# Patient Record
Sex: Male | Born: 1940
Health system: Southern US, Community
[De-identification: ages and names within clinical notes are randomized; demographics above are authoritative.]

## PROBLEM LIST (undated history)

## (undated) DIAGNOSIS — M1990 Unspecified osteoarthritis, unspecified site: Secondary | ICD-10-CM

## (undated) DIAGNOSIS — F1010 Alcohol abuse, uncomplicated: Secondary | ICD-10-CM

## (undated) DIAGNOSIS — J449 Chronic obstructive pulmonary disease, unspecified: Secondary | ICD-10-CM

## (undated) DIAGNOSIS — I779 Disorder of arteries and arterioles, unspecified: Secondary | ICD-10-CM

## (undated) DIAGNOSIS — I10 Essential (primary) hypertension: Secondary | ICD-10-CM

## (undated) DIAGNOSIS — D649 Anemia, unspecified: Secondary | ICD-10-CM

## (undated) DIAGNOSIS — N189 Chronic kidney disease, unspecified: Secondary | ICD-10-CM

## (undated) DIAGNOSIS — I739 Peripheral vascular disease, unspecified: Secondary | ICD-10-CM

## (undated) DIAGNOSIS — E11319 Type 2 diabetes mellitus with unspecified diabetic retinopathy without macular edema: Secondary | ICD-10-CM

## (undated) DIAGNOSIS — I2510 Atherosclerotic heart disease of native coronary artery without angina pectoris: Secondary | ICD-10-CM

## (undated) DIAGNOSIS — E785 Hyperlipidemia, unspecified: Secondary | ICD-10-CM

## (undated) DIAGNOSIS — I639 Cerebral infarction, unspecified: Secondary | ICD-10-CM

## (undated) DIAGNOSIS — E119 Type 2 diabetes mellitus without complications: Secondary | ICD-10-CM

## (undated) HISTORY — PX: APPENDECTOMY: SHX54

## (undated) HISTORY — DX: Alcohol abuse, uncomplicated: F10.10

## (undated) HISTORY — PX: VASECTOMY: SHX75

## (undated) HISTORY — PX: CORONARY ARTERY BYPASS GRAFT: SHX141

## (undated) HISTORY — DX: Hyperlipidemia, unspecified: E78.5

## (undated) HISTORY — DX: Disorder of arteries and arterioles, unspecified: I77.9

## (undated) HISTORY — DX: Atherosclerotic heart disease of native coronary artery without angina pectoris: I25.10

## (undated) HISTORY — DX: Essential (primary) hypertension: I10

## (undated) HISTORY — DX: Peripheral vascular disease, unspecified: I73.9

## (undated) HISTORY — DX: Chronic obstructive pulmonary disease, unspecified: J44.9

## (undated) HISTORY — DX: Type 2 diabetes mellitus without complications: E11.9

## (undated) HISTORY — DX: Cerebral infarction, unspecified: I63.9

## (undated) HISTORY — PX: BACK SURGERY: SHX140

## (undated) HISTORY — PX: CAROTID ENDARTERECTOMY: SUR193

## (undated) HISTORY — DX: Type 2 diabetes mellitus with unspecified diabetic retinopathy without macular edema: E11.319

---
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
## 2018-02-28 LAB — HM DIABETES EYE EXAM

## 2018-06-01 ENCOUNTER — Ambulatory Visit (INDEPENDENT_AMBULATORY_CARE_PROVIDER_SITE_OTHER): Payer: Medicare HMO

## 2018-06-01 ENCOUNTER — Ambulatory Visit (INDEPENDENT_AMBULATORY_CARE_PROVIDER_SITE_OTHER): Payer: Medicare HMO | Admitting: Physician Assistant

## 2018-06-01 ENCOUNTER — Encounter: Payer: Self-pay | Admitting: Physician Assistant

## 2018-06-01 VITALS — BP 129/81 | HR 101 | Resp 14 | Ht 65.0 in | Wt 133.0 lb

## 2018-06-01 DIAGNOSIS — Z981 Arthrodesis status: Secondary | ICD-10-CM

## 2018-06-01 DIAGNOSIS — I6521 Occlusion and stenosis of right carotid artery: Secondary | ICD-10-CM

## 2018-06-01 DIAGNOSIS — Z23 Encounter for immunization: Secondary | ICD-10-CM | POA: Diagnosis not present

## 2018-06-01 DIAGNOSIS — I779 Disorder of arteries and arterioles, unspecified: Secondary | ICD-10-CM | POA: Insufficient documentation

## 2018-06-01 DIAGNOSIS — R000 Tachycardia, unspecified: Secondary | ICD-10-CM

## 2018-06-01 DIAGNOSIS — R05 Cough: Secondary | ICD-10-CM | POA: Diagnosis not present

## 2018-06-01 DIAGNOSIS — Z130 Encounter for screening for diseases of the blood and blood-forming organs and certain disorders involving the immune mechanism: Secondary | ICD-10-CM

## 2018-06-01 DIAGNOSIS — J449 Chronic obstructive pulmonary disease, unspecified: Secondary | ICD-10-CM

## 2018-06-01 DIAGNOSIS — I739 Peripheral vascular disease, unspecified: Secondary | ICD-10-CM

## 2018-06-01 DIAGNOSIS — Z7689 Persons encountering health services in other specified circumstances: Secondary | ICD-10-CM

## 2018-06-01 DIAGNOSIS — E1143 Type 2 diabetes mellitus with diabetic autonomic (poly)neuropathy: Secondary | ICD-10-CM

## 2018-06-01 DIAGNOSIS — Z91199 Patient's noncompliance with other medical treatment and regimen due to unspecified reason: Secondary | ICD-10-CM

## 2018-06-01 DIAGNOSIS — R0602 Shortness of breath: Secondary | ICD-10-CM

## 2018-06-01 DIAGNOSIS — I2510 Atherosclerotic heart disease of native coronary artery without angina pectoris: Secondary | ICD-10-CM

## 2018-06-01 DIAGNOSIS — R413 Other amnesia: Secondary | ICD-10-CM

## 2018-06-01 DIAGNOSIS — I700 Atherosclerosis of aorta: Secondary | ICD-10-CM

## 2018-06-01 DIAGNOSIS — IMO0002 Reserved for concepts with insufficient information to code with codable children: Secondary | ICD-10-CM

## 2018-06-01 DIAGNOSIS — E1121 Type 2 diabetes mellitus with diabetic nephropathy: Secondary | ICD-10-CM

## 2018-06-01 DIAGNOSIS — E11319 Type 2 diabetes mellitus with unspecified diabetic retinopathy without macular edema: Secondary | ICD-10-CM | POA: Insufficient documentation

## 2018-06-01 DIAGNOSIS — Z951 Presence of aortocoronary bypass graft: Secondary | ICD-10-CM

## 2018-06-01 DIAGNOSIS — E1165 Type 2 diabetes mellitus with hyperglycemia: Secondary | ICD-10-CM

## 2018-06-01 DIAGNOSIS — Z9111 Patient's noncompliance with dietary regimen: Secondary | ICD-10-CM

## 2018-06-01 DIAGNOSIS — Z8639 Personal history of other endocrine, nutritional and metabolic disease: Secondary | ICD-10-CM

## 2018-06-01 MED ORDER — TIOTROPIUM BROMIDE-OLODATEROL 2.5-2.5 MCG/ACT IN AERS: 2.0000 | INHALATION_SPRAY | Freq: Every day | RESPIRATORY_TRACT | 11 refills | Status: DC

## 2018-06-01 MED ORDER — ASPIRIN 325 MG PO TABS: 325.0000 mg | ORAL_TABLET | Freq: Every day | ORAL | Status: DC

## 2018-06-01 MED ORDER — ASPIRIN EC 81 MG PO TBEC: 81.0000 mg | DELAYED_RELEASE_TABLET | Freq: Every day | ORAL | 3 refills | Status: DC

## 2018-06-01 MED FILL — STIOLTO RESPIMAT INHAL SPRY: 2.5-2.5 | 30 days supply | Qty: 4 | Fill #0
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
## 2018-06-01 NOTE — Patient Instructions (Addendum)
Diabetes Preventive Care: - annual foot exam  - annual dilated eye exam with an eye doctor - self foot exams at least weekly - pneumonia vaccine once (booster in 5 years and at age 78) - annual influenza vaccine - twice yearly dental cleanings and yearly exam - goal blood pressure <140/90, ideally <130/80 - LDL cholesterol <70 - A1C <7.0 - body mass index (BMI) <25.0 - follow-up every 3 months if your A1C is not at goal - follow-up every 6 months if diabetes is well controlled   Diabetic Neuropathy Diabetic neuropathy refers to nerve damage that is caused by diabetes (diabetes mellitus). Over time, people with diabetes can develop nerve damage throughout the body. There are several types of diabetic neuropathy:  Peripheral neuropathy. This is the most common type of diabetic neuropathy. It causes damage to nerves that carry signals between the spinal cord and other parts of the body (peripheral nerves). This usually affects nerves in the feet and legs first, and may eventually affect the hands and arms. The damage affects the ability to sense touch or temperature.  Autonomic neuropathy. This type causes damage to nerves that control involuntary functions (autonomic nerves). These nerves carry signals that control: ? Heartbeat. ? Body temperature. ? Blood pressure. ? Urination. ? Digestion. ? Sweating. ? Sexual function. ? Response to changing blood sugar (glucose) levels.  Focal neuropathy. This type of nerve damage affects one area of the body, such as an arm, a leg, or the face. The injury may involve one nerve or a small group of nerves. Focal neuropathy can be painful and unpredictable, and occurs most often in older adults with diabetes. This often develops suddenly, but usually improves over time and does not cause long-term problems.  Proximal neuropathy. This type of nerve damage affects the nerves of the thighs, hips, buttocks, or legs. It causes severe pain, weakness, and  muscle death (atrophy), usually in the thigh muscles. It is more common among older men and people who have type 2 diabetes. The length of recovery time may vary. What are the causes? Peripheral, autonomic, and focal neuropathies are caused by diabetes that is not well controlled with treatment. The cause of proximal neuropathy is not known, but it may be caused by inflammation related to uncontrolled blood glucose levels. What are the signs or symptoms? Peripheral neuropathy Peripheral neuropathy develops slowly over time. When the nerves of the feet and legs no longer work, you may experience:  Burning, stabbing, or aching pain in the legs or feet.  Pain or cramping in the legs or feet.  Loss of feeling (numbness) and inability to feel pressure or pain in the feet. This can lead to: ? Thick calluses or sores on areas of constant pressure. ? Ulcers. ? Reduced ability to feel temperature changes.  Foot deformities.  Muscle weakness.  Loss of balance or coordination. Autonomic neuropathy The symptoms of autonomic neuropathy vary depending on which nerves are affected. Symptoms may include:  Problems with digestion, such as: ? Nausea or vomiting. ? Poor appetite. ? Bloating. ? Diarrhea or constipation. ? Trouble swallowing. ? Losing weight without trying to.  Problems with the heart, blood and lungs, such as: ? Dizziness, especially when standing up. ? Fainting. ? Shortness of breath. ? Irregular heartbeat.  Bladder problems, such as: ? Trouble starting or stopping urination. ? Leaking urine. ? Trouble emptying the bladder. ? Urinary tract infections (UTIs).  Problems with other body functions, such as: ? Sweat. You may sweat too much  or too little. ? Temperature. You might get hot easily. Or, you might feel cold more than usual. ? Sexual function. Men may not be able to get or maintain an erection. Women may have vaginal dryness and difficulty with arousal. Focal  neuropathy Symptoms affect only one area of the body. Common symptoms include:  Numbness.  Tingling.  Burning pain.  Prickling feeling.  Very sensitive skin.  Weakness.  Inability to move (paralysis).  Muscle twitching.  Muscles getting smaller (wasting).  Poor coordination.  Double or blurred vision. Proximal neuropathy  Sudden, severe pain in the hip, thigh, or buttocks. Pain may spread from the back into the legs (sciatica).  Pain and numbness in the arms and legs.  Tingling.  Loss of bladder control or bowel control.  Weakness and wasting of thigh muscles.  Difficulty getting up from a seated position.  Abdominal swelling.  Unexplained weight loss. How is this diagnosed? Diagnosis usually involves reviewing your medical history and any symptoms you have. Diagnosis varies depending on the type of neuropathy your health care provider suspects. Peripheral neuropathy Your health care provider will check areas that are affected by your nervous system (neurologic exam), such as your reflexes, how you move, and what you can feel. You may have other tests, such as:  Blood tests.  Removal and examination of fluid that surrounds the spinal cord (lumbar puncture).  CT scan.  MRI.  A test to check the nerves that control muscles (electromyogram, EMG).  Tests of how quickly messages pass through your nerves (nerve conduction velocity tests).  Removal of a small piece of nerve to be examined under a microscope (biopsy). Autonomic neuropathy You may have tests, such as:  Tests to measure your blood pressure and heart rate. This may include monitoring you while you are safely secured to an exam table that moves you from a lying position to an upright position (table tilt test).  Breathing tests to check your lungs.  Tests to check how food moves through the digestive system (gastric emptying tests).  Blood, sweat, or urine tests.  Ultrasound of your  bladder.  Spinal fluid tests. Focal neuropathy This condition may be diagnosed with:  A neurologic exam.  CT scan.  MRI.  EMG.  Nerve conduction velocity tests. Proximal neuropathy There is no test to diagnose this type of neuropathy. You may have tests to rule out other possible causes of this type of neuropathy. Tests may include:  X-rays of your spine and lumbar region.  Lumbar puncture.  MRI. How is this treated? The goal of treatment is to keep nerve damage from getting worse. The most important part of treatment is keeping your blood glucose level and your A1C level within your target range by following your diabetes management plan. Over time, maintaining lower blood glucose levels helps lessen symptoms. In some cases, you may need prescription pain medicine. Follow these instructions at home:  Lifestyle   Do not use any products that contain nicotine or tobacco, such as cigarettes and e-cigarettes. If you need help quitting, ask your health care provider.  Be physically active every day. Include strength training and balance exercises.  Follow a healthy meal plan.  Work with your health care provider to manage your blood pressure. General instructions  Follow your diabetes management plan as directed. ? Check your blood glucose levels as directed by your health care provider. ? Keep your blood glucose in your target range as directed by your health care provider. ? Have your  A1C level checked at least two times a year, or as often as told by your health care provider.  Take over the counter and prescription medicines only as told by your health care provider. This includes insulin and diabetes medicine.  Do not drive or use heavy machinery while taking prescription pain medicines.  Check your skin and feet every day for cuts, bruises, redness, blisters, or sores.  Keep all follow up visits as told by your health care provider. This is important. Contact a  health care provider if:  You have burning, stabbing, or aching pain in your legs or feet.  You are unable to feel pressure or pain in your feet.  You develop problems with digestion, such as: ? Nausea. ? Vomiting. ? Bloating. ? Constipation. ? Diarrhea. ? Abdominal pain.  You have difficulty with urination, such as inability: ? To control when you urinate (incontinence). ? To completely empty the bladder (retention).  You have palpitations.  You feel dizzy, weak, or faint when you stand up. Get help right away if:  You cannot urinate.  You have sudden weakness or loss of coordination.  You have trouble speaking.  You have pain or pressure in your chest.  You have an irregular heart beat.  You have sudden inability to move a part of your body. Summary  Diabetic neuropathy refers to nerve damage that is caused by diabetes. It can affect nerves throughout the entire body, causing numbness and pain in the arms, legs, digestive tract, heart, and other body systems.  Keep your blood glucose level and your blood pressure in your target range, as directed by your health care provider. This can help prevent neuropathy from getting worse.  Check your skin and feet every day for cuts, bruises, redness, blisters, or sores.  Do not use any products that contain nicotine or tobacco, such as cigarettes and e-cigarettes. If you need help quitting, ask your health care provider. This information is not intended to replace advice given to you by your health care provider. Make sure you discuss any questions you have with your health care provider. Document Released: 06/28/2001 Document Revised: 06/01/2017 Document Reviewed: 05/24/2016 Elsevier Interactive Patient Education  2019 [employer]   Chronic Obstructive Pulmonary Disease  Chronic obstructive pulmonary disease (COPD) is a long-term (chronic) condition that affects the lungs. COPD is a general term that can be used to  describe many different lung problems that cause lung swelling (inflammation) and limit airflow, including chronic bronchitis and emphysema. If you have COPD, your lung function will probably never return to normal. In most cases, it gets worse over time. However, there are steps you can take to slow the progression of the disease and improve your quality of life. What are the causes? This condition may be caused by:  Smoking. This is the most common cause.  Certain genes passed down through families. What increases the risk? The following factors may make you more likely to develop this condition:  Secondhand smoke from cigarettes, pipes, or cigars.  Exposure to chemicals and other irritants such as fumes and dust in the work environment.  Chronic lung conditions or infections. What are the signs or symptoms? Symptoms of this condition include:  Shortness of breath, especially during physical activity.  Chronic cough with a large amount of thick mucus. Sometimes the cough may not have any mucus (dry cough).  Wheezing.  Rapid breaths.  Pearline Cables or bluish discoloration (cyanosis) of the skin, especially in your fingers, toes, or  lips.  Feeling tired (fatigue).  Weight loss.  Chest tightness.  Frequent infections.  Episodes when breathing symptoms become much worse (exacerbations).  Swelling in the ankles, feet, or legs. This may occur in later stages of the disease. How is this diagnosed? This condition is diagnosed based on:  Your medical history.  A physical exam. You may also have tests, including:  Lung (pulmonary) function tests. This may include a spirometry test, which measures your ability to exhale properly.  Chest X-ray.  CT scan.  Blood tests. How is this treated? This condition may be treated with:  Medicines. These may include inhaled rescue medicines to treat acute exacerbations as well as long-term, or maintenance, medicines to prevent flare-ups of  COPD. ? Bronchodilators help treat COPD by dilating the airways to allow increased airflow and make your breathing more comfortable. ? Steroids can reduce airway inflammation and help prevent exacerbations.  Smoking cessation. If you smoke, your health care provider may ask you to quit, and may also recommend therapy or replacement products to help you quit.  Pulmonary rehabilitation. This may involve working with a team of health care providers and specialists, such as respiratory, occupational, and physical therapists.  Exercise and physical activity. These are beneficial for nearly all people with COPD.  Nutrition therapy to gain weight, if you are underweight.  Oxygen. Supplemental oxygen therapy is only helpful if you have a low oxygen level in your blood (hypoxemia).  Lung surgery or transplant.  Palliative care. This is to help people with COPD feel comfortable when treatment is no longer working. Follow these instructions at home: Medicines  Take over-the-counter and prescription medicines (inhaled or pills) only as told by your health care provider.  Talk to your health care provider before taking any cough or allergy medicines. You may need to avoid certain medicines that dry out your airways. Lifestyle  If you are a smoker, the most important thing that you can do is to stop smoking. Do not use any products that contain nicotine or tobacco, such as cigarettes and e-cigarettes. If you need help quitting, ask your health care provider. Continuing to smoke will cause the disease to progress faster.  Avoid exposure to things that irritate your lungs, such as smoke, chemicals, and fumes.  Stay active, but balance activity with periods of rest. Exercise and physical activity will help you maintain your ability to do things you want to do.  Learn and use relaxation techniques to manage stress and to control your breathing.  Get the right amount of sleep and get quality sleep.  Most adults need 7 or more hours per night.  Eat healthy foods. Eating smaller, more frequent meals and resting before meals may help you maintain your strength. Controlled breathing Learn and use controlled breathing techniques as directed by your health care provider. Controlled breathing techniques include:  Pursed lip breathing. Start by breathing in (inhaling) through your nose for 1 second. Then, purse your lips as if you were going to whistle and breathe out (exhale) through the pursed lips for 2 seconds.  Diaphragmatic breathing. Start by putting one hand on your abdomen just above your waist. Inhale slowly through your nose. The hand on your abdomen should move out. Then purse your lips and exhale slowly. You should be able to feel the hand on your abdomen moving in as you exhale. Controlled coughing Learn and use controlled coughing to clear mucus from your lungs. Controlled coughing is a series of short, progressive coughs. The  steps of controlled coughing are: 1. Lean your head slightly forward. 2. Breathe in deeply using diaphragmatic breathing. 3. Try to hold your breath for 3 seconds. 4. Keep your mouth slightly open while coughing twice. 5. Spit any mucus out into a tissue. 6. Rest and repeat the steps once or twice as needed. General instructions  Make sure you receive all the vaccines that your health care provider recommends, especially the pneumococcal and influenza vaccines. Preventing infection and hospitalization is very important when you have COPD.  Use oxygen therapy and pulmonary rehabilitation if directed to by your health care provider. If you require home oxygen therapy, ask your health care provider whether you should purchase a pulse oximeter to measure your oxygen level at home.  Work with your health care provider to develop a COPD action plan. This will help you know what steps to take if your condition gets worse.  Keep other chronic health conditions  under control as told by your health care provider.  Avoid extreme temperature and humidity changes.  Avoid contact with people who have an illness that spreads from person to person (is contagious), such as viral infections or pneumonia.  Keep all follow-up visits as told by your health care provider. This is important. Contact a health care provider if:  You are coughing up more mucus than usual.  There is a change in the color or thickness of your mucus.  Your breathing is more labored than usual.  Your breathing is faster than usual.  You have difficulty sleeping.  You need to use your rescue medicines or inhalers more often than expected.  You have trouble doing routine activities such as getting dressed or walking around the house. Get help right away if:  You have shortness of breath while you are resting.  You have shortness of breath that prevents you from: ? Being able to talk. ? Performing your usual physical activities.  You have chest pain lasting longer than 5 minutes.  Your skin color is more blue (cyanotic) than usual.  You measure low oxygen saturations for longer than 5 minutes with a pulse oximeter.  You have a fever.  You feel too tired to breathe normally. Summary  Chronic obstructive pulmonary disease (COPD) is a long-term (chronic) condition that affects the lungs.  Your lung function will probably never return to normal. In most cases, it gets worse over time. However, there are steps you can take to slow the progression of the disease and improve your quality of life.  Treatment for COPD may include taking medicines, quitting smoking, pulmonary rehabilitation, and changes to diet and exercise. As the disease progresses, you may need oxygen therapy, a lung transplant, or palliative care.  To help manage your condition, do not smoke, avoid exposure to things that irritate your lungs, stay up to date on all vaccines, and follow your health care  provider's instructions for taking medicines. This information is not intended to replace advice given to you by your health care provider. Make sure you discuss any questions you have with your health care provider. Document Released: 01/27/2005 Document Revised: 10/13/2016 Document Reviewed: 05/24/2016 Elsevier Interactive Patient Education  2019 [employer].

## 2018-06-02 ENCOUNTER — Encounter: Payer: Self-pay | Admitting: Physician Assistant

## 2018-06-02 ENCOUNTER — Other Ambulatory Visit: Payer: Self-pay

## 2018-06-02 DIAGNOSIS — R0602 Shortness of breath: Secondary | ICD-10-CM | POA: Insufficient documentation

## 2018-06-02 DIAGNOSIS — R413 Other amnesia: Secondary | ICD-10-CM | POA: Insufficient documentation

## 2018-06-02 DIAGNOSIS — E1121 Type 2 diabetes mellitus with diabetic nephropathy: Secondary | ICD-10-CM | POA: Insufficient documentation

## 2018-06-02 DIAGNOSIS — J449 Chronic obstructive pulmonary disease, unspecified: Secondary | ICD-10-CM | POA: Insufficient documentation

## 2018-06-02 DIAGNOSIS — Z951 Presence of aortocoronary bypass graft: Secondary | ICD-10-CM | POA: Insufficient documentation

## 2018-06-02 DIAGNOSIS — E1143 Type 2 diabetes mellitus with diabetic autonomic (poly)neuropathy: Secondary | ICD-10-CM | POA: Insufficient documentation

## 2018-06-02 DIAGNOSIS — Z981 Arthrodesis status: Secondary | ICD-10-CM | POA: Insufficient documentation

## 2018-06-02 DIAGNOSIS — R000 Tachycardia, unspecified: Secondary | ICD-10-CM | POA: Insufficient documentation

## 2018-06-02 DIAGNOSIS — E1165 Type 2 diabetes mellitus with hyperglycemia: Secondary | ICD-10-CM

## 2018-06-02 DIAGNOSIS — IMO0002 Reserved for concepts with insufficient information to code with codable children: Secondary | ICD-10-CM

## 2018-06-02 DIAGNOSIS — E1140 Type 2 diabetes mellitus with diabetic neuropathy, unspecified: Secondary | ICD-10-CM | POA: Insufficient documentation

## 2018-06-02 DIAGNOSIS — M19019 Primary osteoarthritis, unspecified shoulder: Secondary | ICD-10-CM | POA: Insufficient documentation

## 2018-06-02 LAB — LIPID PANEL W/REFLEX DIRECT LDL
CHOLESTEROL: 206 mg/dL — AB (ref <200)
HDL: 40 mg/dL — ABNORMAL LOW (ref >40)
LDL Cholesterol (Calc): 144 mg/dL (calc) — ABNORMAL HIGH
Non-HDL Cholesterol (Calc): 166 mg/dL (calc) — ABNORMAL HIGH (ref <130)
Total CHOL/HDL Ratio: 5.2 (calc) — ABNORMAL HIGH (ref <5.0)
Triglycerides: 102 mg/dL (ref <150)

## 2018-06-02 LAB — CBC
HCT: 42.2 % (ref 38.5–50.0)
Hemoglobin: 14.3 g/dL (ref 13.2–17.1)
MCH: 30.6 pg (ref 27.0–33.0)
MCHC: 33.9 g/dL (ref 32.0–36.0)
MCV: 90.2 fL (ref 80.0–100.0)
MPV: 9.8 fL (ref 7.5–12.5)
Platelets: 423 10*3/uL — ABNORMAL HIGH (ref 140–400)
RBC: 4.68 10*6/uL (ref 4.20–5.80)
RDW: 12 % (ref 11.0–15.0)
WBC: 10.5 10*3/uL (ref 3.8–10.8)

## 2018-06-02 LAB — COMPLETE METABOLIC PANEL WITH GFR
AG Ratio: 1.1 (calc) (ref 1.0–2.5)
ALT: 10 U/L (ref 9–46)
AST: 13 U/L (ref 10–35)
Albumin: 3.8 g/dL (ref 3.6–5.1)
Alkaline phosphatase (APISO): 113 U/L (ref 40–115)
BUN/Creatinine Ratio: 14 (calc) (ref 6–22)
BUN: 17 mg/dL (ref 7–25)
CO2: 27 mmol/L (ref 20–32)
Calcium: 9.3 mg/dL (ref 8.6–10.3)
Chloride: 101 mmol/L (ref 98–110)
Creat: 1.25 mg/dL — ABNORMAL HIGH (ref 0.70–1.18)
GFR, EST AFRICAN AMERICAN: 64 mL/min/{1.73_m2} (ref >=60)
GFR, Est Non African American: 55 mL/min/{1.73_m2} — ABNORMAL LOW (ref >=60)
Globulin: 3.6 g/dL (calc) (ref 1.9–3.7)
Glucose, Bld: 196 mg/dL — ABNORMAL HIGH (ref 65–99)
Potassium: 4.4 mmol/L (ref 3.5–5.3)
Sodium: 137 mmol/L (ref 135–146)
TOTAL PROTEIN: 7.4 g/dL (ref 6.1–8.1)
Total Bilirubin: 0.6 mg/dL (ref 0.2–1.2)

## 2018-06-02 LAB — MICROALBUMIN / CREATININE URINE RATIO
Creatinine, Urine: 126 mg/dL (ref 20–320)
MICROALB UR: 185.8 mg/dL
Microalb Creat Ratio: 1475 mcg/mg creat — ABNORMAL HIGH (ref <30)

## 2018-06-02 LAB — RPR: RPR Ser Ql: NONREACTIVE

## 2018-06-02 LAB — C-REACTIVE PROTEIN: CRP: 14.5 mg/L — ABNORMAL HIGH (ref <8.0)

## 2018-06-02 LAB — SEDIMENTATION RATE: Sed Rate: 43 mm/h — ABNORMAL HIGH (ref 0–20)

## 2018-06-02 LAB — HEMOGLOBIN A1C
Hgb A1c MFr Bld: 10.2 % of total Hgb — ABNORMAL HIGH (ref <5.7)
Mean Plasma Glucose: 246 (calc)
eAG (mmol/L): 13.6 (calc)

## 2018-06-02 LAB — B12 AND FOLATE PANEL
Folate: 15.3 ng/mL
Vitamin B-12: 562 pg/mL (ref 200–1100)

## 2018-06-02 LAB — TSH+FREE T4: TSH W/REFLEX TO FT4: 2.07 mIU/L (ref 0.40–4.50)

## 2018-06-02 MED ORDER — EMPAGLIFLOZIN 10 MG PO TABS: 10.0000 mg | ORAL_TABLET | ORAL | 1 refills | Status: DC

## 2018-06-02 MED ORDER — DULAGLUTIDE 0.75 MG/0.5ML ~~LOC~~ SOAJ: 0.5000 mL | SUBCUTANEOUS | 1 refills | Status: DC

## 2018-06-02 MED ORDER — LOSARTAN POTASSIUM 25 MG PO TABS: 25.0000 mg | ORAL_TABLET | ORAL | 1 refills | Status: DC

## 2018-06-02 MED FILL — LOSARTAN POTASSIUM 25 MG TA: 25 | 30 days supply | Qty: 30 | Fill #0

## 2018-06-02 MED FILL — TRULICITY 0.75 MG/0.5 ML PE: 0.75 | 28 days supply | Qty: 2 | Fill #0

## 2018-06-02 MED FILL — JARDIANCE 10 MG TABLET: 10 | 30 days supply | Qty: 30 | Fill #0

## 2018-06-02 NOTE — Progress Notes (Signed)
Labs show uncontrolled diabetes, A1c over 10 Definitely need to restart medication Rather than putting him on Lantus which increases risks of hypoglycemia, I would like to start him on a once weekly injectable medicine called Trulicity.  Also going to start him on an oral medicine called Jardiance.  Kelly Williams has been shown to decrease the risk of heart attack and death from heart attack in patients with known heart disease. Also he really should be on a statin medication for his coronary artery disease.  His LDL is 144 and his goal is less than 70. Lastly labs are showing kidney injury from the diabetes, known as diabetic nephropathy.  Treatment for this is blood pressure medication known as an ACE inhibitor.  Patient mentioned at last office visit that he did not like how his heart medications made him feel.  We could start a very low dose of losartan and see how he does with this.  We will need to recheck kidney function in 2 weeks Follow-up in the office for diabetes in 1 month

## 2018-06-20 ENCOUNTER — Telehealth: Payer: Self-pay | Admitting: Physician Assistant

## 2018-06-20 DIAGNOSIS — J449 Chronic obstructive pulmonary disease, unspecified: Secondary | ICD-10-CM

## 2018-06-20 MED ORDER — NEBULIZER/TUBING/MOUTHPIECE KIT: PACK | 5 refills | Status: DC

## 2018-06-20 MED ORDER — ALBUTEROL SULFATE HFA 108 (90 BASE) MCG/ACT IN AERS: 1.0000 | INHALATION_SPRAY | RESPIRATORY_TRACT | 5 refills | Status: AC | PRN

## 2018-06-20 MED ORDER — NEBULIZER/TUBING/MOUTHPIECE KIT: PACK | 5 refills | Status: AC

## 2018-06-20 MED FILL — ALBUTEROL SULFATE HFA 108 (: 108 (90 BAS | 12 days supply | Qty: 9 | Fill #0

## 2018-06-20 NOTE — Addendum Note (Signed)
Addended by: Lavon Paganini on: 06/20/2018 04:33 PM   Modules accepted: Orders

## 2018-06-20 NOTE — Telephone Encounter (Signed)
Daughter advised.

## 2018-06-20 NOTE — Telephone Encounter (Signed)
Sent 1. Rx for neb tubing and 2. Rx for Albuterol MDI inhaler to pharmacy on file

## 2018-06-20 NOTE — Telephone Encounter (Signed)
Per daughter request, Rx for neb tubing printed to be sent to [facility] fax # [phone] and phone number is [phone]

## 2018-06-20 NOTE — Telephone Encounter (Signed)
Pt's daughter called to request Rx for nebulizer tubing (uses duoneb every 4 hours PRN SOB) and an albuterol rescue inhaler. Routing.

## 2018-06-29 ENCOUNTER — Ambulatory Visit (INDEPENDENT_AMBULATORY_CARE_PROVIDER_SITE_OTHER): Payer: Medicare HMO | Admitting: Physician Assistant

## 2018-06-29 ENCOUNTER — Encounter: Payer: Self-pay | Admitting: Physician Assistant

## 2018-06-29 VITALS — BP 132/78 | HR 87 | Wt 138.0 lb

## 2018-06-29 DIAGNOSIS — Z5181 Encounter for therapeutic drug level monitoring: Secondary | ICD-10-CM

## 2018-06-29 DIAGNOSIS — E1165 Type 2 diabetes mellitus with hyperglycemia: Secondary | ICD-10-CM | POA: Diagnosis not present

## 2018-06-29 DIAGNOSIS — J449 Chronic obstructive pulmonary disease, unspecified: Secondary | ICD-10-CM

## 2018-06-29 DIAGNOSIS — Z9989 Dependence on other enabling machines and devices: Secondary | ICD-10-CM | POA: Insufficient documentation

## 2018-06-29 DIAGNOSIS — N189 Chronic kidney disease, unspecified: Secondary | ICD-10-CM | POA: Insufficient documentation

## 2018-06-29 DIAGNOSIS — E1143 Type 2 diabetes mellitus with diabetic autonomic (poly)neuropathy: Secondary | ICD-10-CM

## 2018-06-29 DIAGNOSIS — Z9181 History of falling: Secondary | ICD-10-CM | POA: Insufficient documentation

## 2018-06-29 DIAGNOSIS — N289 Disorder of kidney and ureter, unspecified: Secondary | ICD-10-CM | POA: Insufficient documentation

## 2018-06-29 DIAGNOSIS — IMO0002 Reserved for concepts with insufficient information to code with codable children: Secondary | ICD-10-CM

## 2018-06-29 LAB — POCT GLYCOSYLATED HEMOGLOBIN (HGB A1C): HbA1c, POC (controlled diabetic range): 8.5 % — AB (ref 0.0–7.0)

## 2018-06-29 MED ORDER — FREESTYLE LIBRE SENSOR SYSTEM MISC: 0 refills | Status: DC

## 2018-06-29 MED ORDER — DULAGLUTIDE 1.5 MG/0.5ML ~~LOC~~ SOAJ: 1.5000 mg | SUBCUTANEOUS | 1 refills | Status: DC

## 2018-06-29 MED FILL — TRULICITY 1.5 MG/0.5 ML PEN: 1.5 | 28 days supply | Qty: 2 | Fill #0

## 2018-06-29 NOTE — Patient Instructions (Addendum)
Ask insurance if there are any other COPD inhalers on his formulary that are more affordable?  Ask insurance if there are any medications equivalent to Jardiance, which is an SGLT2 inhibitor?   Diabetes Preventive Care: - annual foot exam  - annual dilated eye exam with an eye doctor - self foot exams at least weekly - pneumonia vaccine once (booster in 5 years and at age 78) - annual influenza vaccine - twice yearly dental cleanings and yearly exam - goal blood pressure <140/90, ideally <130/80 - LDL cholesterol <70 - A1C <7.0 - body mass index (BMI) <25.0 - follow-up every 3 months if your A1C is not at goal - follow-up every 6 months if diabetes is well controlled    Fall Prevention in the Home, Adult Falls can cause injuries and can affect people from all age groups. There are many simple things that you can do to make your home safe and to help prevent falls. Ask for help when making these changes, if needed. What actions can I take to prevent falls? General instructions  Use good lighting in all rooms. Replace any light bulbs that burn out.  Turn on lights if it is dark. Use night-lights.  Place frequently used items in easy-to-reach places. Lower the shelves around your home if necessary.  Set up furniture so that there are clear paths around it. Avoid moving your furniture around.  Remove throw rugs and other tripping hazards from the floor.  Avoid walking on wet floors.  Fix any uneven floor surfaces.  Add color or contrast paint or tape to grab bars and handrails in your home. Place contrasting color strips on the first and last steps of stairways.  When you use a stepladder, make sure that it is completely opened and that the sides are firmly locked. Have someone hold the ladder while you are using it. Do not climb a closed stepladder.  Be aware of any and all pets. What can I do in the bathroom?      Keep the floor dry. Immediately clean up any water that  spills onto the floor.  Remove soap buildup in the tub or shower on a regular basis.  Use non-skid mats or decals on the floor of the tub or shower.  Attach bath mats securely with double-sided, non-slip rug tape.  If you need to sit down while you are in the shower, use a plastic, non-slip stool.  Install grab bars by the toilet and in the tub and shower. Do not use towel bars as grab bars. What can I do in the bedroom?  Make sure that a bedside light is easy to reach.  Do not use oversized bedding that drapes onto the floor.  Have a firm chair that has side arms to use for getting dressed. What can I do in the kitchen?  Clean up any spills right away.  If you need to reach for something above you, use a sturdy step stool that has a grab bar.  Keep electrical cables out of the way.  Do not use floor polish or wax that makes floors slippery. If you must use wax, make sure that it is non-skid floor wax. What can I do in the stairways?  Do not leave any items on the stairs.  Make sure that you have a light switch at the top of the stairs and the bottom of the stairs. Have them installed if you do not have them.  Make sure that there are  handrails on both sides of the stairs. Fix handrails that are broken or loose. Make sure that handrails are as long as the stairways.  Install non-slip stair treads on all stairs in your home.  Avoid having throw rugs at the top or bottom of stairways, or secure the rugs with carpet tape to prevent them from moving.  Choose a carpet design that does not hide the edge of steps on the stairway.  Check any carpeting to make sure that it is firmly attached to the stairs. Fix any carpet that is loose or worn. What can I do on the outside of my home?  Use bright outdoor lighting.  Regularly repair the edges of walkways and driveways and fix any cracks.  Remove high doorway thresholds.  Trim any shrubbery on the main path into your  home.  Regularly check that handrails are securely fastened and in good repair. Both sides of any steps should have handrails.  Install guardrails along the edges of any raised decks or porches.  Clear walkways of debris and clutter, including tools and rocks.  Have leaves, snow, and ice cleared regularly.  Use sand or salt on walkways during winter months.  In the garage, clean up any spills right away, including grease or oil spills. What other actions can I take?  Wear closed-toe shoes that fit well and support your feet. Wear shoes that have rubber soles or low heels.  Use mobility aids as needed, such as canes, walkers, scooters, and crutches.  Review your medicines with your health care provider. Some medicines can cause dizziness or changes in blood pressure, which increase your risk of falling. Talk with your health care provider about other ways that you can decrease your risk of falls. This may include working with a physical therapist or trainer to improve your strength, balance, and endurance. Where to find more information  Centers for Disease Control and Prevention, STEADI: WebmailGuide.co.za  [employer] on Aging: BrainJudge.co.uk Contact a health care provider if:  You are afraid of falling at home.  You feel weak, drowsy, or dizzy at home.  You fall at home. Summary  There are many simple things that you can do to make your home safe and to help prevent falls.  Ways to make your home safe include removing tripping hazards and installing grab bars in the bathroom.  Ask for help when making these changes in your home. This information is not intended to replace advice given to you by your health care provider. Make sure you discuss any questions you have with your health care provider. Document Released: 04/09/2002 Document Revised: 12/02/2016 Document Reviewed: 12/02/2016 Elsevier Interactive Patient Education  2019 Elsevier  Inc.    Chronic Kidney Disease, Adult Chronic kidney disease (CKD) occurs when the kidneys become damaged slowly over a long period of time. The kidneys are a pair of organs that do many important jobs in the body, including:  Removing waste and extra fluid from the blood to make urine.  Making hormones that maintain the amount of fluid in tissues and blood vessels.  Maintaining the right amount of fluids and chemicals in the body. A small amount of kidney damage may not cause problems, but a large amount of damage may make it hard or impossible for the kidneys to work the way they should. If steps are not taken to slow down kidney damage or to stop it from getting worse, the kidneys may stop working permanently (end-stage renal disease or ESRD). Most  of the time, CKD does not go away, but it can often be controlled. People who have CKD are usually able to live normal lives. What are the causes? The most common causes of this condition are diabetes and high blood pressure (hypertension). Other causes include:  Heart and blood vessel (cardiovascular) disease.  Kidney diseases, such as: ? Glomerulonephritis. ? Interstitial nephritis. ? Polycystic kidney disease. ? Renal vascular disease.  Diseases that affect the immune system.  Genetic diseases.  Medicines that damage the kidneys, such as anti-inflammatory medicines.  Being around or being in contact with poisonous (toxic) substances.  A kidney or urinary infection that occurs again and again (recurs).  Vasculitis. This is swelling or inflammation of the blood vessels.  A problem with urine flow that may be caused by: ? Cancer. ? Having kidney stones more than one time. ? An enlarged prostate, in males. What increases the risk? You are more likely to develop this condition if you:  Are older than age 67.  Are male.  Are African-American, Hispanic, Asian, [place], or American [place].  Are a current or former  smoker.  Are obese.  Have a family history of kidney disease or failure.  Often take medicines that are damaging to the kidneys. What are the signs or symptoms? Symptoms of this condition include:  Swelling (edema) of the face, legs, ankles, or feet.  Tiredness (lethargy) and having less energy.  Nausea or vomiting.  Confusion or trouble concentrating.  Problems with urination, such as: ? Painful or burning feeling during urination. ? Decreased urine production. ? Frequent urination, especially at night. ? Bloody urine.  Muscle twitches and cramps, especially in the legs.  Shortness of breath.  Weakness.  Loss of appetite.  Metallic taste in the mouth.  Trouble sleeping.  Dry, itchy skin.  A low blood count (anemia).  Pale lining of the eyelids and surface of the eye (conjunctiva). Symptoms develop slowly and may not be obvious until the kidney damage becomes severe. It is possible to have kidney disease for years without having any symptoms. How is this diagnosed? This condition may be diagnosed based on:  Blood tests.  Urine tests.  Imaging tests, such as an ultrasound or CT scan.  A test in which a sample of tissue is removed from the kidneys to be examined under a microscope (kidney biopsy). These test results will help your health care provider determine how serious the CKD is. How is this treated? There is no cure for most cases of this condition, but treatment usually relieves symptoms and prevents or slows the progression of the disease. Treatment may include:  Making diet changes, which may require you to avoid alcohol, salty foods (sodium), and foods that are high in potassium, calcium, and protein.  Medicines: ? To lower blood pressure. ? To control blood glucose. ? To relieve anemia. ? To relieve swelling. ? To protect your bones. ? To improve the balance of electrolytes in your blood.  Removing toxic waste from the body through types of  dialysis, if the kidneys can no longer do their job (kidney failure).  Managing any other conditions that are causing your CKD or making it worse. Follow these instructions at home: Medicines  Take over-the-counter and prescription medicines only as told by your health care provider. The dose of some medicines that you take may need to be adjusted.  Do not take any new medicines unless approved by your health care provider. Many medicines can worsen your kidney  damage.  Do not take any vitamin and mineral supplements unless approved by your health care provider. Many nutritional supplements can worsen your kidney damage. General instructions  Follow your prescribed diet as told by your health care provider.  Do not use any products that contain nicotine or tobacco, such as cigarettes and e-cigarettes. If you need help quitting, ask your health care provider.  Monitor and track your blood pressure at home. Report changes in your blood pressure as told by your health care provider.  If you are being treated for diabetes, monitor and track your blood sugar (blood glucose) levels as told by your health care provider.  Maintain a healthy weight. If you need help with this, ask your health care provider.  Start or continue an exercise plan. Exercise at least 30 minutes a day, 5 days a week.  Keep your immunizations up to date as told by your health care provider.  Keep all follow-up visits as told by your health care provider. This is important. Where to find more information  American Association of Kidney Patients: BombTimer.gl  National Kidney Foundation: www.kidney.[place]: https://mathis.com/  Life Options Rehabilitation Program: www.lifeoptions.org and www.kidneyschool.org Contact a health care provider if:  Your symptoms get worse.  You develop new symptoms. Get help right away if:  You develop symptoms of ESRD, which include: ? Headaches. ? Numbness in the  hands or feet. ? Easy bruising. ? Frequent hiccups. ? Chest pain. ? Shortness of breath. ? Lack of menstruation, in women.  You have a fever.  You have decreased urine production.  You have pain or bleeding when you urinate. Summary  Chronic kidney disease (CKD) occurs when the kidneys become damaged slowly over a long period of time.  The most common causes of this condition are diabetes and high blood pressure (hypertension).  There is no cure for most cases of this condition, but treatment usually relieves symptoms and prevents or slows the progression of the disease. Treatment may include a combination of medicines and lifestyle changes. This information is not intended to replace advice given to you by your health care provider. Make sure you discuss any questions you have with your health care provider. Document Released: 01/27/2008 Document Revised: 05/27/2016 Document Reviewed: 05/27/2016 Elsevier Interactive Patient Education  2019 [employer].

## 2018-06-29 NOTE — Progress Notes (Signed)
HPI:                                                                Kelly Williams is a 78 y.o. male who presents to [facility] today for medication management   DM2: he wast started on Trulicity and Jardiance on 06/01/18 for uncontrolled DM with A1C of 10.2. States he is unable to afford Jardiance. Would also like to try [employer] system. Does not currently monitor blood glucose at home, but has not had any dizziness/diaphoresis or nausea to suggest hypoglycemia. Self-reported diagnoses of retinopathy and neuropathy. Daughter states he was seen by Op[facility]. Recently and they told him retinopathy had healed. He continues to endorse chronic bilateral foot and lower extremity numbness. Denies pain or burning sensations.  CAD: hx of CABG approx 17 years ago. 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 He does not take his CV medications because he states "they make me feel funny." However, he was started on Losartan 25 mg last month and has been taking this without side effects.  COPD: he was started on Stiolto last month. Has not noticed a difference in his breathing. However, daughter reports is using nebulizer and rescue inhaler less often. He is only using rescue inhaler approx once every 2-3 days. Former smoker, quit >10 years ago, 10 packyear history.  States he still stumbles sometimes when walking due to his neuropathy. He uses a cane and a walker when outside the home. Mostly relies on hand rails and the wall when getting around at home. No recent falls. Fall last year did not result in injury.  Past Medical History:  Diagnosis Date  . Alcohol abuse   . Alcohol abuse   . Carotid arterial disease ([place])   . COPD (chronic obstructive pulmonary disease) ([place])   . Coronary artery disease   . Diabetes mellitus without complication ([place])   . Diabetic retinopathy ([place])   . Hyperlipidemia   . Hypertension    . Stroke [facility])    Past Surgical History:  Procedure Laterality Date  . BACK SURGERY    . CAROTID ENDARTERECTOMY Right   . CORONARY ARTERY BYPASS GRAFT    . VASECTOMY     Social History   Tobacco Use  . Smoking status: Former Smoker    Packs/day: 1.00    Years: 10.00    Pack years: 10.00    Types: Cigarettes    Last attempt to quit: 10/12/2002    Years since quitting: 15.7  . Smokeless tobacco: Never Used  Substance Use Topics  . Alcohol use: Not Currently   family history is not on file.    ROS: negative except as noted in the HPI  Medications: Current Outpatient Medications  Medication Sig Dispense Refill  . albuterol (PROVENTIL HFA;VENTOLIN HFA) 108 (90 Base) MCG/ACT inhaler Inhale 1-2 puffs into the lungs every 4 (four) hours as needed for wheezing or shortness of breath (bronchospasm). 1 Inhaler 5  . aspirin EC 81 MG tablet Take 1 tablet (81 mg total) by mouth daily. 90 tablet 3  . Continuous Blood Gluc Sensor (FREESTYLE LIBRE SENSOR SYSTEM) MISC Check morning fasting blood glucose daily and up to four times daily as  needed. Please include device and sensors 1 each 0  . Dulaglutide (TRULICITY) 1.5 PO/2.4MP SOPN Inject 1.5 mg into the skin once a week. 12 pen 1  . ipratropium-albuterol (DUONEB) 0.5-2.5 (3) MG/3ML SOLN Take 3 mLs by nebulization.    [name] losartan (COZAAR) 25 MG tablet Take 1 tablet (25 mg total) by mouth every morning. 30 tablet 1  . Respiratory Therapy Supplies (NEBULIZER/TUBING/MOUTHPIECE) KIT Use as directed with nebulizer treatments (Duoneb) every 4 hours prn. Dx: J44.9 1 each 5  . Tiotropium Bromide-Olodaterol (STIOLTO RESPIMAT) 2.5-2.5 MCG/ACT AERS Inhale 2 puffs into the lungs daily. 1 Inhaler 11   No current facility-administered medications for this visit.    Allergies  Allergen Reactions  . Metformin And Related Diarrhea       Objective:  BP 132/78   Pulse 87   Wt 138 lb (62.6 kg)   SpO2 97%   BMI 22.96 kg/m  Gen:  alert, not  ill-appearing, no distress, appropriate for age 64: head normocephalic without obvious abnormality, conjunctiva and cornea clear, wearing glasses, trachea midline Pulm: Normal work of breathing, normal phonation, clear to auscultation bilaterally, no wheezes, rales or rhonchi CV: Normal rate, regular rhythm, s1 and s2 distinct, no murmurs, clicks or rubs  Neuro: alert and oriented x 3, no tremor MSK: extremities atraumatic, ambulating with cane, slow steady gait and station Skin: intact, no rashes on exposed skin, no jaundice, no cyanosis   Lab Results  Component Value Date   CREATININE 1.25 (H) 06/01/2018   BUN 17 06/01/2018   NA 137 06/01/2018   K 4.4 06/01/2018   CL 101 06/01/2018   CO2 27 06/01/2018   Lab Results  Component Value Date   ALT 10 06/01/2018   AST 13 06/01/2018   BILITOT 0.6 06/01/2018   Lab Results  Component Value Date   CHOL 206 (H) 06/01/2018   HDL 40 (L) 06/01/2018   LDLCALC 144 (H) 06/01/2018   TRIG 102 06/01/2018   CHOLHDL 5.2 (H) 06/01/2018     Results for orders placed or performed in visit on 06/29/18 (from the past 72 hour(s))  POCT HgB A1C     Status: Abnormal   Collection Time: 06/29/18  1:43 PM  Result Value Ref Range   Hemoglobin A1C     HbA1c POC (<> result, manual entry)     HbA1c, POC (prediabetic range)     HbA1c, POC (controlled diabetic range) 8.5 (A) 0.0 - 7.0 %   No results found.    Assessment and Plan: 78 y.o. male with   .Nuchem was seen today for hyperglycemia.  Diagnoses and all orders for this visit:  Uncontrolled type 2 diabetes mellitus with autonomic neuropathy (HCC) -     POCT HgB A1C -     Dulaglutide (TRULICITY) 1.5 NT/6.1WE SOPN; Inject 1.5 mg into the skin once a week. -     Continuous Blood Gluc Sensor ([place]) MISC; Check morning fasting blood glucose daily and up to four times daily as needed. Please include device and sensors  Ambulates with cane  At moderate risk for  fall  Encounter for medication monitoring -     Renal Profile with Estimated GFR  Renal insufficiency -     Renal Profile with Estimated GFR   COPD Good air movement on exam, pulse ox 97% on RA Decreased need for rescue medication Immunizations UTD Cont Stiolto daily  Type 2 DM Good response to Trulicity and Jardiance Unfortunately unable to afford Jardiance.  I would like him to have the cardiovascular benefits of an SGLT2 inhibitor, so daughter will contact insurance regarding cost of equivalent medications In the interim, we will discontinue Jardiance and optimize Trulicity at 0-2/2 mg weekly.  I expect he will tolerate this with A1c of 8.5 and no nausea or intolerance to current dose of Trulicity Preventive health maintenance for diabetes is reviewed and up-to-date Declined statin therapy  Renal insufficiency GFR 55, no prior kidney function for comparison though I suspect this is stable chronic kidney disease in the setting of longstanding hypertension and diabetes Rechecking renal function today due to starting angiotensin receptor blocker Plan to continue losartan 25  Moderate fall risk secondary to peripheral neuropathy Discussed option of home health physical therapy, patient and daughter declined at this time Counseled on fall prevention Encouraged to use cane or walker when ambulating at home  Patient education and anticipatory guidance given Patient agrees with treatment plan Follow-up in 3 months or sooner as needed if symptoms worsen or fail to improve  Darlyne Russian PA-C

## 2018-06-30 LAB — RENAL PROFILE WITH ESTIMATED GFR
Albumin: 3.8 g/dL (ref 3.6–5.1)
BUN/Creatinine Ratio: 16 (calc) (ref 6–22)
BUN: 20 mg/dL (ref 7–25)
CO2: 24 mmol/L (ref 20–32)
Calcium: 8.9 mg/dL (ref 8.6–10.3)
Chloride: 106 mmol/L (ref 98–110)
Creat: 1.23 mg/dL — ABNORMAL HIGH (ref 0.70–1.18)
GFR, Est African American: 65 mL/min/{1.73_m2} (ref >=60)
GFR, Est Non African American: 56 mL/min/{1.73_m2} — ABNORMAL LOW (ref >=60)
Glucose, Bld: 121 mg/dL — ABNORMAL HIGH (ref 65–99)
Phosphorus: 3.6 mg/dL (ref 2.1–4.3)
Potassium: 4 mmol/L (ref 3.5–5.3)
Sodium: 138 mmol/L (ref 135–146)

## 2018-07-31 MED FILL — TRULICITY 1.5 MG/0.5 ML PEN: 1.5 | 28 days supply | Qty: 2 | Fill #1

## 2018-08-02 MED FILL — LOSARTAN POTASSIUM 25 MG TA: 25 | 30 days supply | Qty: 30 | Fill #1

## 2018-08-24 ENCOUNTER — Telehealth: Payer: Self-pay | Admitting: Physician Assistant

## 2018-08-24 NOTE — Telephone Encounter (Signed)
Patient will need to provide a 30-day glucose log  Documentation will need to be faxed to [facility] for FreeStyle libre system Form is in [place] inbox

## 2018-08-24 NOTE — Telephone Encounter (Signed)
Spoke with daughter and she stated that her dad does not need freestyle device.  She ordered for [place] to not fax paperwork back. -EH/RMA

## 2018-08-31 MED FILL — TRULICITY 1.5 MG/0.5 ML PEN: 1.5 | 28 days supply | Qty: 2 | Fill #2

## 2018-09-22 MED FILL — TRULICITY 1.5 MG/0.5 ML PEN: 1.5 | 28 days supply | Qty: 2 | Fill #2

## 2018-10-27 ENCOUNTER — Ambulatory Visit (INDEPENDENT_AMBULATORY_CARE_PROVIDER_SITE_OTHER): Payer: Medicare Other | Admitting: Physician Assistant

## 2018-10-27 ENCOUNTER — Encounter: Payer: Self-pay | Admitting: Physician Assistant

## 2018-10-27 VITALS — BP 168/79 | HR 90 | Temp 98.5°F | Wt 131.0 lb

## 2018-10-27 DIAGNOSIS — E1143 Type 2 diabetes mellitus with diabetic autonomic (poly)neuropathy: Secondary | ICD-10-CM

## 2018-10-27 DIAGNOSIS — M79674 Pain in right toe(s): Secondary | ICD-10-CM | POA: Insufficient documentation

## 2018-10-27 DIAGNOSIS — J449 Chronic obstructive pulmonary disease, unspecified: Secondary | ICD-10-CM

## 2018-10-27 DIAGNOSIS — N183 Chronic kidney disease, stage 3 (moderate): Secondary | ICD-10-CM

## 2018-10-27 DIAGNOSIS — M65341 Trigger finger, right ring finger: Secondary | ICD-10-CM

## 2018-10-27 DIAGNOSIS — I129 Hypertensive chronic kidney disease with stage 1 through stage 4 chronic kidney disease, or unspecified chronic kidney disease: Secondary | ICD-10-CM

## 2018-10-27 DIAGNOSIS — J439 Emphysema, unspecified: Secondary | ICD-10-CM

## 2018-10-27 DIAGNOSIS — I2510 Atherosclerotic heart disease of native coronary artery without angina pectoris: Secondary | ICD-10-CM

## 2018-10-27 DIAGNOSIS — E1121 Type 2 diabetes mellitus with diabetic nephropathy: Secondary | ICD-10-CM

## 2018-10-27 LAB — POCT GLYCOSYLATED HEMOGLOBIN (HGB A1C): HbA1c, POC (prediabetic range): 6 % (ref 5.7–6.4)

## 2018-10-27 MED ORDER — LOSARTAN POTASSIUM 25 MG PO TABS: 25.0000 mg | ORAL_TABLET | ORAL | 1 refills | Status: DC

## 2018-10-27 MED ORDER — TRULICITY 1.5 MG/0.5ML ~~LOC~~ SOAJ: 1.5000 mg | SUBCUTANEOUS | 1 refills | Status: DC

## 2018-10-27 MED ORDER — IPRATROPIUM-ALBUTEROL 0.5-2.5 (3) MG/3ML IN SOLN: 3.0000 mL | Freq: Four times a day (QID) | RESPIRATORY_TRACT | 5 refills | Status: AC | PRN

## 2018-10-27 MED ORDER — ASPIRIN EC 81 MG PO TBEC: 81.0000 mg | DELAYED_RELEASE_TABLET | Freq: Every day | ORAL | 3 refills | Status: DC

## 2018-10-27 MED ORDER — STIOLTO RESPIMAT 2.5-2.5 MCG/ACT IN AERS: 2.0000 | INHALATION_SPRAY | Freq: Every day | RESPIRATORY_TRACT | 3 refills | Status: AC

## 2018-10-27 NOTE — Progress Notes (Signed)
HPI:                                                                Kelly Williams is a 78 y.o. male who presents to [facility] today for medication management   DM2: taking Trulicity 1.5 mg weekly. Does not currently monitor blood glucose at home, but has not had any dizziness/diaphoresis or nausea to suggest hypoglycemia. Self-reported diagnoses of retinopathy and neuropathy. Eye exam UTD. He continues to endorse chronic bilateral foot and lower extremity numbness. Denies pain or burning sensations.  CAD: hx of CABG approx 17 years ago. 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 Taking Losartan and baby asa. Declines statin therapy.  COPD: he was started on Stiolto 6 months ago. Has not noticed a difference in his breathing. However, daughter reports is using nebulizer and rescue inhaler less often. He is only using rescue inhaler approx once every 2-3 days. Former smoker, quit >10 years ago, 10 packyear history.  Right great toe pain x 1 month - described as an achey pain. No associated swelling or redness. Pain rated as moderate, persistent. Resolved spontaneously today. Denies hx of gout. No hx of trauma/injury.  Also for the last month or so his right ring finger has been locking in flexion. He is right-handed. Denies any pain. He is able to unlock it without assistance from his other hand.  Depression screen [facility] 2/9 10/27/2018 06/01/2018  Decreased Interest 0 0  Down, Depressed, Hopeless 0 0  PHQ - 2 Score 0 0  Altered sleeping 0 -  Tired, decreased energy 0 -  Change in appetite 1 -  Feeling bad or failure about yourself  0 -  Trouble concentrating 0 -  Moving slowly or fidgety/restless 0 -  Suicidal thoughts 0 -  PHQ-9 Score 1 -    Past Medical History:  Diagnosis Date  . Alcohol abuse   . Alcohol abuse   . Carotid arterial disease ([place])   . COPD (chronic obstructive pulmonary disease)  ([place])   . Coronary artery disease   . Diabetes mellitus without complication ([place])   . Diabetic retinopathy ([place])   . Hyperlipidemia   . Hypertension   . Stroke [facility])    Past Surgical History:  Procedure Laterality Date  . BACK SURGERY    . CAROTID ENDARTERECTOMY Right   . CORONARY ARTERY BYPASS GRAFT    . VASECTOMY     Social History   Tobacco Use  . Smoking status: Former Smoker    Packs/day: 1.00    Years: 10.00    Pack years: 10.00    Types: Cigarettes    Quit date: 10/12/2002    Years since quitting: 16.0  . Smokeless tobacco: Never Used  Substance Use Topics  . Alcohol use: Not Currently   family history is not on file.    ROS: negative except as noted in the HPI  Medications: Current Outpatient Medications  Medication Sig Dispense Refill  . albuterol (PROVENTIL HFA;VENTOLIN HFA) 108 (90 Base) MCG/ACT inhaler Inhale 1-2 puffs into the lungs every 4 (four) hours as needed for wheezing or shortness of breath (bronchospasm). 1 Inhaler 5  . aspirin EC 81 MG tablet Take 1  tablet (81 mg total) by mouth daily. 90 tablet 3  . Dulaglutide (TRULICITY) 1.5 WY/6.3ZC SOPN Inject 1.5 mg into the skin once a week. 12 pen 1  . ipratropium-albuterol (DUONEB) 0.5-2.5 (3) MG/3ML SOLN Take 3 mLs by nebulization every 6 (six) hours as needed. 360 mL 5  . losartan (COZAAR) 25 MG tablet Take 1 tablet (25 mg total) by mouth every morning. 90 tablet 1  . Respiratory Therapy Supplies (NEBULIZER/TUBING/MOUTHPIECE) KIT Use as directed with nebulizer treatments (Duoneb) every 4 hours prn. Dx: J44.9 1 each 5  . Tiotropium Bromide-Olodaterol (STIOLTO RESPIMAT) 2.5-2.5 MCG/ACT AERS Inhale 2 puffs into the lungs daily. 12 g 3   No current facility-administered medications for this visit.    Allergies  Allergen Reactions  . Metformin And Related Diarrhea       Objective:  BP (!) 168/79   Pulse 90   Temp 98.5 F (36.9 C)   Wt 131 lb (59.4 kg)   SpO2 95%   BMI 21.80 kg/m  Vitals:    10/27/18 1328 10/27/18 1339  BP: (!) 198/103 (!) 168/79  Pulse: 90   Temp: 98.5 F (36.9 C)   SpO2: 95%   Gen:  alert, not ill-appearing, no distress, appropriate for age HEENT: head normocephalic without obvious abnormality, conjunctiva and cornea clear, wearing glasses, trachea midline Pulm: Normal work of breathing, normal phonation, clear to auscultation bilaterally, no wheezes, rales or rhonchi CV: Normal rate, regular rhythm, s1 and s2 distinct, no murmurs, clicks or rubs  Neuro: alert and oriented x 3, no tremor MSK: extremities atraumatic, ambulating with cane, slow steady gait and station, no peripheral edema Right great toe: atraumatic, no redness or edema, no pain of the MTP, full passive ROM Right hand: nodule on palmar aspect of 4th MCP, locking at the 4th MCP joint with finger flexion Skin: intact, no rashes on exposed skin, no jaundice, no cyanosis  BP Readings from Last 3 Encounters:  10/27/18 (!) 168/79  06/29/18 132/78  06/01/18 129/81    Lab Results  Component Value Date   CREATININE 1.23 (H) 06/29/2018   BUN 20 06/29/2018   NA 138 06/29/2018   K 4.0 06/29/2018   CL 106 06/29/2018   CO2 24 06/29/2018   Lab Results  Component Value Date   ALT 10 06/01/2018   AST 13 06/01/2018   BILITOT 0.6 06/01/2018   Lab Results  Component Value Date   CHOL 206 (H) 06/01/2018   HDL 40 (L) 06/01/2018   LDLCALC 144 (H) 06/01/2018   TRIG 102 06/01/2018   CHOLHDL 5.2 (H) 06/01/2018   Lab Results  Component Value Date   HGBA1C 6.0 10/27/2018     Results for orders placed or performed in visit on 10/27/18 (from the past 72 hour(s))  POCT HgB A1C     Status: None   Collection Time: 10/27/18  1:36 PM  Result Value Ref Range   Hemoglobin A1C     HbA1c POC (<> result, manual entry)     HbA1c, POC (prediabetic range) 6.0 5.7 - 6.4 %   HbA1c, POC (controlled diabetic range)     No results found.    Assessment and Plan: 78 y.o. male with   .Kelly Williams was seen  today for medication management.  Diagnoses and all orders for this visit:  Controlled type 2 diabetes mellitus with diabetic autonomic neuropathy, without long-term current use of insulin (HCC) -     Dulaglutide (TRULICITY) 1.5 HY/8.5OY SOPN; Inject 1.5 mg into the skin  once a week. -     POCT HgB A1C  Diabetic nephropathy associated with type 2 diabetes mellitus (HCC) -     Uric acid -     CBC with Differential/Platelet -     C-reactive protein -     Renal Profile with Estimated GFR -     losartan (COZAAR) 25 MG tablet; Take 1 tablet (25 mg total) by mouth every morning. -     POCT HgB A1C  Pulmonary emphysema, unspecified emphysema type (HCC) -     ipratropium-albuterol (DUONEB) 0.5-2.5 (3) MG/3ML SOLN; Take 3 mLs by nebulization every 6 (six) hours as needed. -     Tiotropium Bromide-Olodaterol (STIOLTO RESPIMAT) 2.5-2.5 MCG/ACT AERS; Inhale 2 puffs into the lungs daily.  Great toe pain, right -     Uric acid -     CBC with Differential/Platelet -     C-reactive protein -     Renal Profile with Estimated GFR -     DG Foot Complete Right  Renal insufficiency -     Renal Profile with Estimated GFR  Coronary artery disease involving native heart without angina pectoris, unspecified vessel or lesion type -     losartan (COZAAR) 25 MG tablet; Take 1 tablet (25 mg total) by mouth every morning. -     aspirin EC 81 MG tablet; Take 1 tablet (81 mg total) by mouth daily.  Chronic obstructive pulmonary disease, unspecified COPD type (HCC)  Trigger finger, right ring finger   COPD Good air movement on exam, pulse ox 95% on RA Immunizations UTD Cont Stiolto daily  Type 2 DM Lab Results  Component Value Date   HGBA1C 6.0 10/27/2018  Well controlled Cont Trulicity 1.5 mg weekly Preventive health maintenance for diabetes is reviewed and up-to-date Declined statin therapy Cont baby asa for primary prevention  Hypertensive kidney disease BP out of range on 2 checks in office  today. BP is typically normotensive in office today BP goal <140/90 Cont Losartan 25 mg for now. Reassess in 3 months Renal function pending  Trigger finger, right 4th MCP Patient has no pain and is not particularly bothered by triggering Active surveillance F/u with Sports Medicine prn  Right great toe pain DDx includes OA, gout, neuropathy Uric acid, CBC, CRP pending X-ray pending No pain today and benign physical exam   Patient education and anticipatory guidance given Patient agrees with treatment plan Follow-up in 3 months or sooner as needed if symptoms worsen or fail to improve  Darlyne Russian PA-C

## 2018-10-27 NOTE — Patient Instructions (Addendum)
Diabetes Preventive Care: - annual foot exam  - annual dilated eye exam with an eye doctor - self foot exams at least weekly - pneumonia vaccine once (booster in 5 years and at age 78) - annual influenza vaccine - twice yearly dental cleanings and yearly exam - goal blood pressure <140/90, ideally <130/80 - LDL cholesterol <70 - A1C <7.0 - body mass index (BMI) <25.0 - follow-up every 3 months if your A1C is not at goal - follow-up every 6 months if diabetes is well controlled   Food Basics for Chronic Kidney Disease When your kidneys are not working well, they cannot remove waste and excess substances from your blood as effectively as they did before. This can lead to a buildup and imbalance of these substances, which can worsen kidney damage and affect how your body functions. Certain foods lead to a buildup of these substances in the body. By changing your diet as recommended by your diet and nutrition specialist (dietitian) or health care provider, you could help prevent further kidney damage and delay or prevent the need for dialysis. What are tips for following this plan? General instructions   Work with your health care provider and dietitian to develop a meal plan that is right for you. Foods you can eat, limit, or avoid will be different for each person depending on the stage of kidney disease and any other existing health conditions.  Talk with your health care provider about whether you should take a vitamin and mineral supplement.  Use standard measuring cups and spoons to measure servings of foods. Use a kitchen scale to measure portions of protein foods.  If directed by your health care provider, avoid drinking too much fluid. Measure and count all liquids, including water, ice, soups, flavored gelatin, and frozen desserts such as popsicles or ice cream. Reading food labels  Check the amount of sodium in foods. Choose foods that have less than 300 milligrams (mg) per  serving.  Check the ingredient list for phosphorus or potassium-based additives or preservatives.  Check the amount of saturated and trans fat. Limit or avoid these fats as told by your dietitian. Shopping  Avoid buying foods that are: ? Processed, frozen, or prepackaged. ? Calcium-enriched or fortified.  Do not buy foods that have salt or sodium listed among the first five ingredients.  Do not buy canned vegetables. Cooking  Replace animal proteins, such as meat, fish, eggs, or dairy, with plant proteins from beans, nuts, and soy. ? Use soy milk instead of cow's milk. ? Add beans or tofu to soups, casseroles, or pasta dishes instead of meat.  Soak vegetables, such as potatoes, before cooking to reduce potassium. To do this: ? Peel and cut into small pieces. ? Soak in warm water for at least 2 hours. For every 1 cup of vegetables, use 10 cups of water. ? Drain and rinse with warm water. ? Boil for at least 5 minutes. Meal planning  Limit the amount of protein from plant and animal sources you eat each day.  Do not add salt to food when cooking or before eating.  Eat meals and snacks at around the same time each day. If you have diabetes:  If you have diabetes (diabetes mellitus) and chronic kidney disease, it is important to keep your blood glucose in the target range recommended by your health care provider. Follow your diabetes management plan. This may include: ? Checking your blood glucose regularly. ? Taking oral medicines, insulin, or both. ?  Exercising for at least 30 minutes on 5 or more days each week, or as told by your health care provider. ? Tracking how many servings of carbohydrates you eat at each meal.  You may be given specific guidelines on how much of certain foods and nutrients you may eat, depending on your stage of kidney disease and whether you have high blood pressure (hypertension). Follow your meal plan as told by your dietitian. What nutrients  should be limited? The items listed are not a complete list. Talk with your dietitian about what dietary choices are best for you. Potassium Potassium affects how steadily your heart beats. If too much potassium builds up in your blood, it can cause an irregular heartbeat or even a heart attack. You may need to eat less potassium, depending on your blood potassium levels and the stage of kidney disease. Talk to your dietitian about how much potassium you may have each day. You may need to limit or avoid foods that are high in potassium, such as:  Milk and soy milk.  Fruits, such as bananas, papaya, apricots, nectarines, melon, prunes, raisins, kiwi, and oranges.  Vegetables, such as potatoes, sweet potatoes, yams, tomatoes, leafy greens, beets, okra, avocado, pumpkin, and winter squash.  White and lima beans. Phosphorus Phosphorus is a mineral found in your bones. A balance between calcium and phosphorous is needed to build and maintain healthy bones. Too much phosphorus pulls calcium from your bones. This can make your bones weak and more likely to break. Too much phosphorus can also make your skin itch. You may need to eat less phosphorus depending on your blood phosphorus levels and the stage of kidney disease. Talk to your dietitian about how much potassium you may have each day. You may need to take medicine to lower your blood phosphorus levels if diet changes do not help. You may need to limit or avoid foods that are high in phosphorus, such as:  Milk and dairy products.  Dried beans and peas.  Tofu, soy milk, and other soy-based meat replacements.  Colas.  Nuts and peanut butter.  Meat, poultry, and fish.  Bran cereals and oatmeals. Protein Protein helps you to make and keep muscle. It also helps in the repair of your body's cells and tissues. One of the natural breakdown products of protein is a waste product called urea. When your kidneys are not working properly, they  cannot remove wastes, such as urea, like they did before you developed chronic kidney disease. Reducing how much protein you eat can help prevent a buildup of urea in your blood. Depending on your stage of kidney disease, you may need to limit foods that are high in protein. Sources of animal protein include:  Meat (all types).  Fish and seafood.  Poultry.  Eggs.  Dairy. Other protein foods include:  Beans and legumes.  Nuts and nut butter.  Soy and tofu. Sodium Sodium, which is found in salt, helps maintain a healthy balance of fluids in your body. Too much sodium can increase your blood pressure and have a negative effect on the function of your heart and lungs. Too much sodium can also cause your body to retain too much fluid, making your kidneys work harder. Most people should have less than 2,300 milligrams (mg) of sodium each day. If you have hypertension, you may need to limit your sodium to 1,500 mg each day. Talk to your dietitian about how much sodium you may have each day. You may need  to limit or avoid foods that are high in sodium, such as:  Salt seasonings.  Soy sauce.  Cured and processed meats.  Salted crackers and snack foods.  Fast food.  Canned soups and most canned foods.  Pickled foods.  Vegetable juice.  Boxed mixes or ready-to-eat boxed meals and side dishes.  Bottled dressings, sauces, and marinades. Summary  Chronic kidney disease can lead to a buildup and imbalance of waste and excess substances in the body. Certain foods lead to a buildup of these substances. By adjusting your intake of these foods, you could help prevent more kidney damage and delay or prevent the need for dialysis.  Food adjustments are different for each person with chronic kidney disease. Work with a dietitian to set up nutrient goals and a meal plan that is right for you.  If you have diabetes and chronic kidney disease, it is important to keep your blood glucose in the  target range recommended by your health care provider. This information is not intended to replace advice given to you by your health care provider. Make sure you discuss any questions you have with your health care provider. Document Released: 07/10/2002 Document Revised: 08/10/2018 Document Reviewed: 04/14/2016 Elsevier Patient Education  2020 [employer].

## 2018-10-28 LAB — RENAL PROFILE WITH ESTIMATED GFR
Albumin: 3.9 g/dL (ref 3.6–5.1)
BUN/Creatinine Ratio: 18 (calc) (ref 6–22)
BUN: 23 mg/dL (ref 7–25)
CO2: 26 mmol/L (ref 20–32)
Calcium: 9.2 mg/dL (ref 8.6–10.3)
Chloride: 106 mmol/L (ref 98–110)
Creat: 1.26 mg/dL — ABNORMAL HIGH (ref 0.70–1.18)
GFR, Est African American: 63 mL/min/{1.73_m2} (ref >=60)
GFR, Est Non African American: 54 mL/min/{1.73_m2} — ABNORMAL LOW (ref >=60)
Glucose, Bld: 97 mg/dL (ref 65–99)
Phosphorus: 3.6 mg/dL (ref 2.1–4.3)
Potassium: 4.9 mmol/L (ref 3.5–5.3)
Sodium: 140 mmol/L (ref 135–146)

## 2018-10-28 LAB — CBC WITH DIFFERENTIAL/PLATELET
Absolute Monocytes: 556 cells/uL (ref 200–950)
Basophils Absolute: 144 cells/uL (ref 0–200)
Basophils Relative: 1.4 %
Eosinophils Absolute: 165 cells/uL (ref 15–500)
Eosinophils Relative: 1.6 %
HCT: 40.1 % (ref 38.5–50.0)
Hemoglobin: 13.3 g/dL (ref 13.2–17.1)
Lymphs Abs: 3172 cells/uL (ref 850–3900)
MCH: 30.6 pg (ref 27.0–33.0)
MCHC: 33.2 g/dL (ref 32.0–36.0)
MCV: 92.2 fL (ref 80.0–100.0)
MPV: 9.8 fL (ref 7.5–12.5)
Monocytes Relative: 5.4 %
Neutro Abs: 6262 cells/uL (ref 1500–7800)
Neutrophils Relative %: 60.8 %
Platelets: 366 10*3/uL (ref 140–400)
RBC: 4.35 10*6/uL (ref 4.20–5.80)
RDW: 12.9 % (ref 11.0–15.0)
Total Lymphocyte: 30.8 %
WBC: 10.3 10*3/uL (ref 3.8–10.8)

## 2018-10-28 LAB — C-REACTIVE PROTEIN: CRP: 2 mg/L (ref <8.0)

## 2018-10-28 LAB — URIC ACID: Uric Acid, Serum: 7 mg/dL (ref 4.0–8.0)

## 2018-11-01 MED FILL — TRULICITY 1.5 MG/0.5 ML PEN: 1.5 | 28 days supply | Qty: 2 | Fill #3

## 2018-12-05 MED FILL — TRULICITY 1.5 MG/0.5 ML PEN: 1.5 | 28 days supply | Qty: 2 | Fill #4

## 2019-01-02 ENCOUNTER — Telehealth: Payer: Self-pay

## 2019-01-02 NOTE — Telephone Encounter (Signed)
Kelly Williams called and states he can't afford the Trulicity. He states his insurance will pay for metformin or glipizide. Please advise.

## 2019-01-03 NOTE — Telephone Encounter (Signed)
Neither of these are great options. He has an intolerance to Metformin. Glipizide will not likely offer enough glycemic control  Barnet Pall, can you see if there are any GLP-1's on formulary for him? Can you also inquire about long-acting insulins?

## 2019-01-04 NOTE — Telephone Encounter (Signed)
I called and spoke with Kelly Williams at [facility] and she states that Trulicity and Toujeo are on the formulary but he is needing more than the 0.08 ml that is allowed for a day. I have submitted a PA to see if this may help with the cost. They are both tier 1 and that is as low as you can go. FYI.

## 2019-01-09 NOTE — Telephone Encounter (Signed)
Barnet Pall, any updates on the PA?

## 2019-01-11 NOTE — Telephone Encounter (Deleted)
Mechele Claude called, she is working on this and states she will call Kelly Williams back with an update

## 2019-01-11 NOTE — Telephone Encounter (Signed)
The patient has a follow up on 01/12/2019. I spoke with him to let him know that we could fill out paperwork for patient assistance so that he could still get his medicine. He was appreciative of the help. PCP is aware that I will leave the paperwork in her basket for the patient to fill out at the appointment. No other questions during the call.

## 2019-01-12 ENCOUNTER — Ambulatory Visit (INDEPENDENT_AMBULATORY_CARE_PROVIDER_SITE_OTHER): Payer: Medicare Other | Admitting: Physician Assistant

## 2019-01-12 ENCOUNTER — Encounter: Payer: Self-pay | Admitting: Physician Assistant

## 2019-01-12 ENCOUNTER — Other Ambulatory Visit: Payer: Self-pay

## 2019-01-12 VITALS — BP 134/79 | HR 92 | Temp 98.5°F | Wt 131.0 lb

## 2019-01-12 DIAGNOSIS — Z981 Arthrodesis status: Secondary | ICD-10-CM

## 2019-01-12 DIAGNOSIS — E1169 Type 2 diabetes mellitus with other specified complication: Secondary | ICD-10-CM | POA: Diagnosis not present

## 2019-01-12 DIAGNOSIS — G959 Disease of spinal cord, unspecified: Secondary | ICD-10-CM

## 2019-01-12 DIAGNOSIS — R269 Unspecified abnormalities of gait and mobility: Secondary | ICD-10-CM | POA: Diagnosis not present

## 2019-01-12 DIAGNOSIS — R413 Other amnesia: Secondary | ICD-10-CM

## 2019-01-12 DIAGNOSIS — R0609 Other forms of dyspnea: Secondary | ICD-10-CM

## 2019-01-12 DIAGNOSIS — R152 Fecal urgency: Secondary | ICD-10-CM

## 2019-01-12 DIAGNOSIS — Z23 Encounter for immunization: Secondary | ICD-10-CM | POA: Diagnosis not present

## 2019-01-12 DIAGNOSIS — M4807 Spinal stenosis, lumbosacral region: Secondary | ICD-10-CM

## 2019-01-12 DIAGNOSIS — M47812 Spondylosis without myelopathy or radiculopathy, cervical region: Secondary | ICD-10-CM

## 2019-01-12 DIAGNOSIS — G912 (Idiopathic) normal pressure hydrocephalus: Secondary | ICD-10-CM | POA: Diagnosis not present

## 2019-01-12 DIAGNOSIS — R195 Other fecal abnormalities: Secondary | ICD-10-CM | POA: Diagnosis not present

## 2019-01-12 DIAGNOSIS — R159 Full incontinence of feces: Secondary | ICD-10-CM | POA: Diagnosis not present

## 2019-01-12 DIAGNOSIS — J439 Emphysema, unspecified: Secondary | ICD-10-CM

## 2019-01-12 LAB — POCT GLYCOSYLATED HEMOGLOBIN (HGB A1C): HbA1c, POC (prediabetic range): 5.9 % (ref 5.7–6.4)

## 2019-01-12 MED ORDER — TRULICITY 0.75 MG/0.5ML ~~LOC~~ SOAJ: 0.7500 mg | SUBCUTANEOUS | 0 refills | Status: DC

## 2019-01-12 MED ORDER — TRULICITY 0.75 MG/0.5ML ~~LOC~~ SOAJ: 1.5000 mg | SUBCUTANEOUS | 0 refills | Status: DC

## 2019-01-12 NOTE — Assessment & Plan Note (Addendum)
Relatively new onset incontinence, abnormal gait, forgetfulness. We do need a brain MRI with and without contrast for evaluation of normal pressure hydrocephalus.  There are 2 new strokes in the occipital lobe, this will need to be managed by her PCP.  He does have some spinal stenosis in the lumbar spine but we are also seeing significant degenerative changes in the cervical spine, for this reason we are going to add a cervical spine MRI looking for cervical myelopathy.  I would also like him to see a neurologist if he has not already for further discussion of normal pressure hydrocephalus considering gait disturbance, forgetfulness, incontinence.

## 2019-01-12 NOTE — Patient Instructions (Addendum)

## 2019-01-12 NOTE — Progress Notes (Signed)
HPI:                                                                Kelly Williams is a 78 y.o. male who presents to [place]: [place] today for medication management   DM2: taking Trulicity 1.5 mg weekly. Does not currently monitor blood glucose at home, but has not had any dizziness/diaphoresis or nausea to suggest hypoglycemia. Self-reported diagnoses of retinopathy and neuropathy. Eye exam UTD. He continues to endorse chronic bilateral foot and lower extremity numbness. Denies pain or burning sensations.  CAD: hx of CABG approx 17 years ago. 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 Taking Losartan and baby asa. Declines statin therapy.   Reports he feels more unsteady on his feet and is walking "wobbly." he reports multiple near miss falls at home and fell one day last week where he caught himself against the wall. Denies tremor, altered sensation, focal weakness, dizziness, change in coordination. Endorses memory difficulties.  He also has noticed loose stools several times per day for over 1 year, but reports it is gradually worsening. It used to be associated with certain foods.  He states he has fecal urgency and bowel sounds become very active. He occasionally he has not been able to make it to the bathroom. He is taking Pepto-bismol 2-3 times per week.   Depression screen [facility] 2/9 10/27/2018 06/01/2018  Decreased Interest 0 0  Down, Depressed, Hopeless 0 0  PHQ - 2 Score 0 0  Altered sleeping 0 -  Tired, decreased energy 0 -  Change in appetite 1 -  Feeling bad or failure about yourself  0 -  Trouble concentrating 0 -  Moving slowly or fidgety/restless 0 -  Suicidal thoughts 0 -  PHQ-9 Score 1 -    Past Medical History:  Diagnosis Date  . Alcohol abuse   . Alcohol abuse   . Carotid arterial disease ([place])   . COPD (chronic obstructive pulmonary disease) ([place])   . Coronary artery disease   .  Diabetes mellitus without complication ([place])   . Diabetic retinopathy ([place])   . Hyperlipidemia   . Hypertension   . Normocytic anemia 01/16/2019  . Stroke [facility])    Past Surgical History:  Procedure Laterality Date  . BACK SURGERY    . CAROTID ENDARTERECTOMY Right   . CORONARY ARTERY BYPASS GRAFT    . VASECTOMY     Social History   Tobacco Use  . Smoking status: Former Smoker    Packs/day: 1.00    Years: 10.00    Pack years: 10.00    Types: Cigarettes    Quit date: 10/12/2002    Years since quitting: 16.3  . Smokeless tobacco: Never Used  Substance Use Topics  . Alcohol use: Not Currently   family history is not on file.   Review of Systems  Eyes: Negative.   Gastrointestinal: Positive for diarrhea.  Genitourinary:       + incontinence  Musculoskeletal: Positive for gait problem. Negative for back pain (s/p lumbar surgery).       + falls   Neurological: Positive for numbness (diabetic neuropathy). Negative for dizziness, light-headedness and headaches.     Medications: Current  Outpatient Medications  Medication Sig Dispense Refill  . albuterol (PROVENTIL HFA;VENTOLIN HFA) 108 (90 Base) MCG/ACT inhaler Inhale 1-2 puffs into the lungs every 4 (four) hours as needed for wheezing or shortness of breath (bronchospasm). 1 Inhaler 5  . aspirin EC 81 MG tablet Take 1 tablet (81 mg total) by mouth daily. 90 tablet 3  . ipratropium-albuterol (DUONEB) 0.5-2.5 (3) MG/3ML SOLN Take 3 mLs by nebulization every 6 (six) hours as needed. 360 mL 5  . losartan (COZAAR) 25 MG tablet Take 1 tablet (25 mg total) by mouth every morning. 90 tablet 1  . Respiratory Therapy Supplies (NEBULIZER/TUBING/MOUTHPIECE) KIT Use as directed with nebulizer treatments (Duoneb) every 4 hours prn. Dx: J44.9 1 each 5  . Tiotropium Bromide-Olodaterol (STIOLTO RESPIMAT) 2.5-2.5 MCG/ACT AERS Inhale 2 puffs into the lungs daily. 12 g 3  . atorvastatin (LIPITOR) 40 MG tablet Take 1 tablet (40 mg total) by mouth  at bedtime. 90 tablet 1  . Dulaglutide (TRULICITY) 1.66 AY/3.0ZS SOPN Inject 0.75 mg into the skin once a week. 8 pen 0   No current facility-administered medications for this visit.    Allergies  Allergen Reactions  . Metformin And Related Diarrhea       Objective:  BP 134/79   Pulse 92   Temp 98.5 F (36.9 C) (Oral)   Wt 131 lb (59.4 kg)   SpO2 97%   BMI 21.80 kg/m   Wt Readings from Last 3 Encounters:  01/12/19 131 lb (59.4 kg)  10/27/18 131 lb (59.4 kg)  06/29/18 138 lb (62.6 kg)   Temp Readings from Last 3 Encounters:  01/12/19 98.5 F (36.9 C) (Oral)  10/27/18 98.5 F (36.9 C)   BP Readings from Last 3 Encounters:  01/12/19 134/79  10/27/18 (!) 168/79  06/29/18 132/78   Pulse Readings from Last 3 Encounters:  01/12/19 92  10/27/18 90  06/29/18 87   Gen: well-groomed, not ill-appearing, no acute distress HEENT: head normocephalic, atraumatic; conjunctiva and cornea clear, oropharynx clear, moist mucus membranes; neck supple, no meningeal signs Pulm: Normal work of breathing, normal phonation, clear to auscultation bilaterally CV: Normal rate, regular rhythm, s1 and s2 distinct, no murmurs, clicks or rubs Neuro:  cranial nerves II-XII intact, no nystagmus, normal finger-to-nose, normal heel-to-shin, negative pronator drift, normal rapid alternating movements, DTR's intact, normal tone, no tremor MSK: strength 5/5 and symmetric in bilateral upper extremities, strength 4/5 and symmetric in lower extremities,  Gait: lateral deviation of the right foot, abnormal gait pattern veering to the right side, positive Romberg Mental Status: alert and oriented x 3, speech articulate, thought processes clear and goal-directed     BP Readings from Last 3 Encounters:  01/12/19 134/79  10/27/18 (!) 168/79  06/29/18 132/78    Lab Results  Component Value Date   CREATININE 1.49 (H) 01/12/2019   BUN 26 (H) 01/12/2019   NA 140 01/12/2019   K 4.9 01/12/2019   CL 106  01/12/2019   CO2 25 01/12/2019   Lab Results  Component Value Date   ALT 17 01/12/2019   AST 25 01/12/2019   BILITOT 0.3 01/12/2019   Lab Results  Component Value Date   CHOL 206 (H) 06/01/2018   HDL 40 (L) 06/01/2018   LDLCALC 144 (H) 06/01/2018   TRIG 102 06/01/2018   CHOLHDL 5.2 (H) 06/01/2018   Lab Results  Component Value Date   HGBA1C 5.9 01/12/2019     No results found for this or any previous visit (from the  past 72 hour(s)). No results found.    Assessment and Plan: 78 y.o. male with   .[place] was seen today for medication management.  Diagnoses and all orders for this visit:  Type 2 diabetes mellitus with other specified complication, without long-term current use of insulin ([place]) -     Discontinue: Dulaglutide (TRULICITY) 1.49 FW/2.6VZ SOPN; Inject 1.5 mg into the skin once a week. Inject 0.75 mg x 2 (use 2 pens at different injection sites) once a week -     Dulaglutide (TRULICITY) 8.58 IF/0.2DX SOPN; Inject 0.75 mg into the skin once a week. -     POCT HgB A1C  Need for immunization against influenza -     Flu Vaccine QUAD High Dose(Fluad)  Loose stools -     Cancel: COMPLETE METABOLIC PANEL WITH GFR -     Cancel: CBC with Differential/Platelet -     Cancel: Sedimentation rate -     Cancel: C-reactive protein -     CBC with Differential/Platelet -     COMPLETE METABOLIC PANEL WITH GFR -     C-reactive protein -     Sedimentation rate  Incontinence of feces with fecal urgency -     Cancel: COMPLETE METABOLIC PANEL WITH GFR -     Cancel: CBC with Differential/Platelet -     Cancel: Sedimentation rate -     Cancel: C-reactive protein -     CBC with Differential/Platelet -     COMPLETE METABOLIC PANEL WITH GFR -     C-reactive protein -     Sedimentation rate  Dyspnea on exertion -     CT Chest Wo Contrast  Pulmonary emphysema, unspecified emphysema type (HCC) -     CT Chest Wo Contrast  Memory difficulties -     CBC with  Differential/Platelet -     COMPLETE METABOLIC PANEL WITH GFR -     C-reactive protein -     Sedimentation rate -     TSH + free T4 -     B12 and Folate Panel -     RPR -     Ferritin -     Cancel: HIV antibody (with reflex) -     MR BRAIN W WO CONTRAST -     Ambulatory referral to Neurology  Gait disturbance -     CBC with Differential/Platelet -     COMPLETE METABOLIC PANEL WITH GFR -     C-reactive protein -     Sedimentation rate -     TSH + free T4 -     B12 and Folate Panel -     RPR -     Ferritin -     Cancel: HIV antibody (with reflex)  Status post lumbar spinal fusion -     MR LUMBAR SPINE WO CONTRAST -     Ambulatory referral to Physical Therapy  Spinal stenosis of lumbosacral region -     MR LUMBAR SPINE WO CONTRAST  Idiopathic normal pressure hydrocephalus (HCC) -     MR BRAIN W WO CONTRAST  Cervical spondylosis -     MR CERVICAL SPINE WO CONTRAST  Disease of spinal cord (HCC) -     MR CERVICAL SPINE WO CONTRAST  Other orders -     Extra Specimen   Memory difficulties, gait disturbance, lower extremity weakness Due to patient's history of CVA and co-morbidities and concern for serious pathology including CVA, consulted back-up supervising physician Dr. Dianah Field (see  note) Dementia work-up pending as above Recommend MOCA at next appt (not performed today due to time constraints)  Loose stools Lab work-up pending as above May be an AE of Trulicity. His diabetes is very well controlled, so I am going to lower his dose to 0.75 mg weekly and reassess symptoms in 1 month Check A1C in 3 months   Patient education and anticipatory guidance given Patient agrees with treatment plan Follow-up based on diagnostic testing and in 6 weeks with Dr. Darene Lamer or sooner as needed if symptoms worsen or fail to improve  Darlyne Russian PA-C

## 2019-01-12 NOTE — Progress Notes (Addendum)
Subjective:    CC: Abnormal gait  HPI: This is a pleasant 78 year old male, years ago he had a stroke that ended him up with right-sided numbness, as well as weakness.  More recently he has noted an increasing abnormal gait, he feels as though he wanders left and right.  Vision is okay subjectively, he does not feel any vertigo subjectively.  Denies any new onset focal neurologic symptoms.  On further questioning he has noted urinary and fecal incontinence, as well as increasing forgetfulness.  Of note he is post lumbar fusion.  I reviewed the past medical history, family history, social history, surgical history, and allergies today and no changes were needed.  Please see the problem list section below in epic for further details.  Past Medical History: Past Medical History:  Diagnosis Date  . Alcohol abuse   . Alcohol abuse   . Carotid arterial disease ([place])   . COPD (chronic obstructive pulmonary disease) ([place])   . Coronary artery disease   . Diabetes mellitus without complication ([place])   . Diabetic retinopathy ([place])   . Hyperlipidemia   . Hypertension   . Normocytic anemia 01/16/2019  . Stroke [facility])    Past Surgical History: Past Surgical History:  Procedure Laterality Date  . BACK SURGERY    . CAROTID ENDARTERECTOMY Right   . CORONARY ARTERY BYPASS GRAFT    . VASECTOMY     Social History: Social History   Socioeconomic History  . Marital status: Divorced    Spouse name: Not on file  . Number of children: Not on file  . Years of education: Not on file  . Highest education level: Not on file  Occupational History  . Not on file  Social Needs  . Financial resource strain: Not on file  . Food insecurity    Worry: Not on file    Inability: Not on file  . Transportation needs    Medical: Not on file    Non-medical: Not on file  Tobacco Use  . Smoking status: Former Smoker    Packs/day: 1.00    Years: 10.00    Pack years: 10.00    Types: Cigarettes    Quit  date: 10/12/2002    Years since quitting: 16.2  . Smokeless tobacco: Never Used  Substance and Sexual Activity  . Alcohol use: Not Currently  . Drug use: Never  . Sexual activity: Not Currently    Birth control/protection: Abstinence, Surgical  Lifestyle  . Physical activity    Days per week: Not on file    Minutes per session: Not on file  . Stress: Not on file  Relationships  . Social Herbalist on phone: Not on file    Gets together: Not on file    Attends religious service: Not on file    Active member of club or organization: Not on file    Attends meetings of clubs or organizations: Not on file    Relationship status: Not on file  Other Topics Concern  . Not on file  Social History Narrative  . Not on file   Family History: No family history on file. Allergies: Allergies  Allergen Reactions  . Metformin And Related Diarrhea   Medications: See med rec.  Review of Systems: No fevers, chills, night sweats, weight loss, chest pain, or shortness of breath.   Objective:    General: Well Developed, well nourished, and in no acute distress.  Neuro: Alert and oriented x3, extra-ocular muscles  intact, sensation grossly intact.  HEENT: Normocephalic, atraumatic, pupils equal round reactive to light, neck supple, no masses, no lymphadenopathy, thyroid nonpalpable.  Skin: Warm and dry, no rashes. Cardiac: Regular rate and rhythm, no murmurs rubs or gallops, no lower extremity edema.  Respiratory: Clear to auscultation bilaterally. Not using accessory muscles, speaking in full sentences.  Impression and Recommendations:    Status post lumbar spinal fusion History of lumbar fusion, increasing abnormal gait, incontinence, I do think we need to proceed with another MRI, I am okay with a noncontrast. Because of his incontinence I do not think we need x-rays. I think ultimately his unsteady gait is going to be multifactorial and related to his strokes, weakness, as well  as lumbar disease. I am critical and put him into physical therapy for gait training as well.  There are 2 new strokes in the occipital lobe, this will need to be managed by her PCP.  He does have some spinal stenosis in the lumbar spine but we are also seeing significant degenerative changes in the cervical spine, for this reason we are going to add a cervical spine MRI looking for cervical myelopathy.  I would also like him to see a neurologist if he has not already for further discussion of normal pressure hydrocephalus considering gait disturbance, forgetfulness, incontinence.  Memory difficulties Relatively new onset incontinence, abnormal gait, forgetfulness. We do need a brain MRI with and without contrast for evaluation of normal pressure hydrocephalus.  There are 2 new strokes in the occipital lobe, this will need to be managed by her PCP.  He does have some spinal stenosis in the lumbar spine but we are also seeing significant degenerative changes in the cervical spine, for this reason we are going to add a cervical spine MRI looking for cervical myelopathy.  I would also like him to see a neurologist if he has not already for further discussion of normal pressure hydrocephalus considering gait disturbance, forgetfulness, incontinence.  Cervical spondylosis There are 2 new strokes in the occipital lobe, this will need to be managed by her PCP.  He does have some spinal stenosis in the lumbar spine but we are also seeing significant degenerative changes in the cervical spine, for this reason we are going to add a cervical spine MRI looking for cervical myelopathy.  I would also like him to see a neurologist if he has not already for further discussion of normal pressure hydrocephalus considering gait disturbance, forgetfulness, incontinence. Considering his progressive weakness I do not think that we will need to bother with x-rays to begin with.   ___________________________________________  Gwen Her. Dianah Field, M.D., ABFM., CAQSM. Primary Care and Sports Medicine [facility]  Adjunct Professor of [employer] [place] [employer]

## 2019-01-12 NOTE — Assessment & Plan Note (Addendum)
History of lumbar fusion, increasing abnormal gait, incontinence, I do think we need to proceed with another MRI, I am okay with a noncontrast. Because of his incontinence I do not think we need x-rays. I think ultimately his unsteady gait is going to be multifactorial and related to his strokes, weakness, as well as lumbar disease. I am critical and put him into physical therapy for gait training as well.  There are 2 new strokes in the occipital lobe, this will need to be managed by her PCP.  He does have some spinal stenosis in the lumbar spine but we are also seeing significant degenerative changes in the cervical spine, for this reason we are going to add a cervical spine MRI looking for cervical myelopathy.  I would also like him to see a neurologist if he has not already for further discussion of normal pressure hydrocephalus considering gait disturbance, forgetfulness, incontinence.

## 2019-01-15 LAB — CBC WITH DIFFERENTIAL/PLATELET
Absolute Monocytes: 614 cells/uL (ref 200–950)
Basophils Absolute: 93 cells/uL (ref 0–200)
Basophils Relative: 1 %
Eosinophils Absolute: 102 cells/uL (ref 15–500)
Eosinophils Relative: 1.1 %
HCT: 35.9 % — ABNORMAL LOW (ref 38.5–50.0)
Hemoglobin: 11.9 g/dL — ABNORMAL LOW (ref 13.2–17.1)
Lymphs Abs: 2911 cells/uL (ref 850–3900)
MCH: 30.6 pg (ref 27.0–33.0)
MCHC: 33.1 g/dL (ref 32.0–36.0)
MCV: 92.3 fL (ref 80.0–100.0)
MPV: 9.9 fL (ref 7.5–12.5)
Monocytes Relative: 6.6 %
Neutro Abs: 5580 cells/uL (ref 1500–7800)
Neutrophils Relative %: 60 %
Platelets: 354 10*3/uL (ref 140–400)
RBC: 3.89 10*6/uL — ABNORMAL LOW (ref 4.20–5.80)
RDW: 12.6 % (ref 11.0–15.0)
Total Lymphocyte: 31.3 %
WBC: 9.3 10*3/uL (ref 3.8–10.8)

## 2019-01-15 LAB — COMPLETE METABOLIC PANEL WITH GFR
AG Ratio: 1.4 (calc) (ref 1.0–2.5)
ALT: 17 U/L (ref 9–46)
AST: 25 U/L (ref 10–35)
Albumin: 4.1 g/dL (ref 3.6–5.1)
Alkaline phosphatase (APISO): 80 U/L (ref 35–144)
BUN/Creatinine Ratio: 17 (calc) (ref 6–22)
BUN: 26 mg/dL — ABNORMAL HIGH (ref 7–25)
CO2: 25 mmol/L (ref 20–32)
Calcium: 9.1 mg/dL (ref 8.6–10.3)
Chloride: 106 mmol/L (ref 98–110)
Creat: 1.49 mg/dL — ABNORMAL HIGH (ref 0.70–1.18)
GFR, Est African American: 51 mL/min/{1.73_m2} — ABNORMAL LOW (ref >=60)
GFR, Est Non African American: 44 mL/min/{1.73_m2} — ABNORMAL LOW (ref >=60)
Globulin: 2.9 g/dL (calc) (ref 1.9–3.7)
Glucose, Bld: 140 mg/dL — ABNORMAL HIGH (ref 65–99)
Potassium: 4.9 mmol/L (ref 3.5–5.3)
Sodium: 140 mmol/L (ref 135–146)
Total Bilirubin: 0.3 mg/dL (ref 0.2–1.2)
Total Protein: 7 g/dL (ref 6.1–8.1)

## 2019-01-15 LAB — C-REACTIVE PROTEIN: CRP: 2.6 mg/L (ref <8.0)

## 2019-01-15 LAB — EXTRA SPECIMEN

## 2019-01-15 LAB — B12 AND FOLATE PANEL
Folate: 15.5 ng/mL
Vitamin B-12: 479 pg/mL (ref 200–1100)

## 2019-01-15 LAB — SEDIMENTATION RATE: Sed Rate: 14 mm/h (ref 0–20)

## 2019-01-15 LAB — FERRITIN: Ferritin: 116 ng/mL (ref 24–380)

## 2019-01-15 LAB — RPR: RPR Ser Ql: NONREACTIVE

## 2019-01-15 LAB — TSH+FREE T4: TSH W/REFLEX TO FT4: 1.25 mIU/L (ref 0.40–4.50)

## 2019-01-16 ENCOUNTER — Ambulatory Visit (INDEPENDENT_AMBULATORY_CARE_PROVIDER_SITE_OTHER): Payer: Medicare Other

## 2019-01-16 ENCOUNTER — Encounter: Payer: Self-pay | Admitting: Physician Assistant

## 2019-01-16 ENCOUNTER — Other Ambulatory Visit: Payer: Self-pay

## 2019-01-16 ENCOUNTER — Other Ambulatory Visit: Payer: Medicare Other

## 2019-01-16 DIAGNOSIS — Z981 Arthrodesis status: Secondary | ICD-10-CM | POA: Diagnosis not present

## 2019-01-16 DIAGNOSIS — R413 Other amnesia: Secondary | ICD-10-CM

## 2019-01-16 DIAGNOSIS — R7989 Other specified abnormal findings of blood chemistry: Secondary | ICD-10-CM | POA: Insufficient documentation

## 2019-01-16 DIAGNOSIS — J439 Emphysema, unspecified: Secondary | ICD-10-CM | POA: Diagnosis not present

## 2019-01-16 DIAGNOSIS — M4807 Spinal stenosis, lumbosacral region: Secondary | ICD-10-CM

## 2019-01-16 DIAGNOSIS — G912 (Idiopathic) normal pressure hydrocephalus: Secondary | ICD-10-CM

## 2019-01-16 DIAGNOSIS — R0609 Other forms of dyspnea: Secondary | ICD-10-CM | POA: Diagnosis not present

## 2019-01-16 DIAGNOSIS — D649 Anemia, unspecified: Secondary | ICD-10-CM

## 2019-01-16 HISTORY — DX: Anemia, unspecified: D64.9

## 2019-01-16 MED ORDER — GADOBUTROL 1 MMOL/ML IV SOLN
6.0000 mL | Freq: Once | INTRAVENOUS | Status: AC | PRN
  Administered 2019-01-16: 15:00:00 6 mL via INTRAVENOUS

## 2019-01-17 DIAGNOSIS — M47812 Spondylosis without myelopathy or radiculopathy, cervical region: Secondary | ICD-10-CM | POA: Insufficient documentation

## 2019-01-17 NOTE — Assessment & Plan Note (Signed)
There are 2 new strokes in the occipital lobe, this will need to be managed by her PCP.  He does have some spinal stenosis in the lumbar spine but we are also seeing significant degenerative changes in the cervical spine, for this reason we are going to add a cervical spine MRI looking for cervical myelopathy.  I would also like him to see a neurologist if he has not already for further discussion of normal pressure hydrocephalus considering gait disturbance, forgetfulness, incontinence. Considering his progressive weakness I do not think that we will need to bother with x-rays to begin with.

## 2019-01-18 ENCOUNTER — Other Ambulatory Visit: Payer: Self-pay

## 2019-01-18 ENCOUNTER — Ambulatory Visit (INDEPENDENT_AMBULATORY_CARE_PROVIDER_SITE_OTHER): Payer: Medicare Other | Admitting: Physical Therapy

## 2019-01-18 ENCOUNTER — Encounter: Payer: Self-pay | Admitting: Physical Therapy

## 2019-01-18 ENCOUNTER — Other Ambulatory Visit: Payer: Self-pay | Admitting: Physician Assistant

## 2019-01-18 DIAGNOSIS — R296 Repeated falls: Secondary | ICD-10-CM | POA: Diagnosis not present

## 2019-01-18 DIAGNOSIS — E1169 Type 2 diabetes mellitus with other specified complication: Secondary | ICD-10-CM

## 2019-01-18 DIAGNOSIS — M6281 Muscle weakness (generalized): Secondary | ICD-10-CM | POA: Diagnosis not present

## 2019-01-18 DIAGNOSIS — R2681 Unsteadiness on feet: Secondary | ICD-10-CM

## 2019-01-18 DIAGNOSIS — I2510 Atherosclerotic heart disease of native coronary artery without angina pectoris: Secondary | ICD-10-CM

## 2019-01-18 DIAGNOSIS — E785 Hyperlipidemia, unspecified: Secondary | ICD-10-CM

## 2019-01-18 MED ORDER — ATORVASTATIN CALCIUM 40 MG PO TABS: 40.0000 mg | ORAL_TABLET | Freq: Every day | ORAL | 1 refills | Status: DC

## 2019-01-18 NOTE — Telephone Encounter (Signed)
Patient filled out and form and PCP nurse faxed. No other questions.

## 2019-01-18 NOTE — Therapy (Signed)
[place], [place], [address] Phone: [phone]   Fax:  [phone]  Physical Therapy Evaluation  Patient Details  Name: Kelly Williams MRN: [id number] Date of Birth: 1941/01/02 Referring Provider (PT): Silverio Decamp, MD   Encounter Date: 01/18/2019  PT End of Session - 01/18/19 1138    Visit Number  1    Number of Visits  12    Date for PT Re-Evaluation  03/01/19    PT Start Time  1100    PT Stop Time  1135    PT Time Calculation (min)  35 min    Equipment Utilized During Treatment  Gait belt    Activity Tolerance  Patient tolerated treatment well    Behavior During Therapy  [facility] for tasks assessed/performed       Past Medical History:  Diagnosis Date  . Alcohol abuse   . Alcohol abuse   . Carotid arterial disease ([place])   . COPD (chronic obstructive pulmonary disease) ([place])   . Coronary artery disease   . Diabetes mellitus without complication ([place])   . Diabetic retinopathy ([place])   . Hyperlipidemia   . Hypertension   . Normocytic anemia 01/16/2019  . Stroke [facility])     Past Surgical History:  Procedure Laterality Date  . BACK SURGERY    . CAROTID ENDARTERECTOMY Right   . CORONARY ARTERY BYPASS GRAFT    . VASECTOMY      There were no vitals filed for this visit.   Subjective Assessment - 01/18/19 1104    Subjective  Pt is a 78 y/o male who presents to OPPT for increasing gait difficulties.  Pt reports neuropathy in feet as well.  Pt reports recent falls, most recent 2 weeks ago.  Pt also had MRI of lumbar spine and brain yesterday; now with pending cervical MRI.    Patient Stated Goals  "get some feeling back in my feet"    Currently in Pain?  No/denies         [facility] PT Assessment - 01/18/19 1108      Assessment   Medical Diagnosis  s/p lumbar fusion; gait difficulties    Referring Provider (PT)  Silverio Decamp, MD    Onset Date/Surgical Date  --    progressive x 2 years   Hand Dominance  Right    Next MD Visit  02/16/2019    Prior Therapy  following back surgery - 10-15 yrs ago      Precautions   Precautions  Fall      Restrictions   Weight Bearing Restrictions  No      Balance Screen   Has the patient fallen in the past 6 months  Yes    How many times?  "I've had more than 1; but that's just the one that I can recall"    Has the patient had a decrease in activity level because of a fear of falling?   Yes    Is the patient reluctant to leave their home because of a fear of falling?   No      Home Social worker  Private residence    Living Arrangements  Children   daughter -RN (10 hrs M-Th)   Available Help at Discharge  Family;Available PRN/intermittently    Type of Home  Other(Comment)   condo   Home Access  Stairs to enter    Entrance Stairs-Number of Steps  13  Entrance Stairs-Rails  Right;Left;Can reach both    Home Layout  One level    [place] - single point      Prior Function   Level of Independence  Independent    Vocation  Retired    Biomedical scientist  retired Pharmacist, hospital    Leisure  watch TV; no regular exercise      Cognition   Overall Cognitive Status  Within Functional Limits for tasks assessed      ROM / Strength   AROM / PROM / Strength  Strength      Strength   Overall Strength Comments  significant atrophy noted in Rt quads; tested in sitting    Strength Assessment Site  Hip;Knee;Ankle    Right/Left Hip  Right;Left    Right Hip Flexion  3+/5    Left Hip Flexion  4/5    Right/Left Knee  Right;Left    Right Knee Flexion  4/5    Right Knee Extension  4/5    Left Knee Flexion  5/5    Left Knee Extension  5/5    Right/Left Ankle  Right;Left    Right Ankle Dorsiflexion  3+/5    Left Ankle Dorsiflexion  5/5      Ambulation/Gait   Gait Pattern  Ataxic;Wide base of support   bil toe out     Standardized Balance Assessment   Standardized  Balance Assessment  Berg Balance Test;Timed Up and Go Test      Berg Balance Test   Sit to Stand  Needs minimal aid to stand or to stabilize    Standing Unsupported  Able to stand 2 minutes with supervision    Sitting with Back Unsupported but Feet Supported on Floor or Stool  Able to sit safely and securely 2 minutes    Stand to Sit  Controls descent by using hands    Transfers  Able to transfer with verbal cueing and /or supervision    Standing Unsupported with Eyes Closed  Able to stand 10 seconds with supervision    Standing Unsupported with Feet Together  Able to place feet together independently but unable to hold for 30 seconds    From Standing, Reach Forward with Outstretched Arm  Can reach forward >5 cm safely (2")    From Standing Position, Pick up Object from Floor  Able to pick up shoe, needs supervision    From Standing Position, Turn to Look Behind Over each Shoulder  Needs supervision when turning    Turn 360 Degrees  Needs close supervision or verbal cueing    Standing Unsupported, Alternately Place Feet on Step/Stool  Needs assistance to keep from falling or unable to try    Standing Unsupported, One Foot in Front  Able to plae foot ahead of the other independently and hold 30 seconds    Standing on One Leg  Unable to try or needs assist to prevent fall    Total Score  28      Timed Up and Go Test   Normal TUG (seconds)  15.99    Manual TUG (seconds)  16.69    Cognitive TUG (seconds)  16.14                Objective measurements completed on examination: See above findings.              PT Education - 01/18/19 1138    Education Details  clinical findings; POC; goals of care - possible need  for further medical work up    [employer]) Educated  Patient    Methods  Explanation    Comprehension  Verbalized understanding          PT Long Term Goals - 01/18/19 1312      PT LONG TERM GOAL #1   Title  independent with HEP    Status  New    Target  Date  03/01/19      PT LONG TERM GOAL #2   Title  improve BERG balance score to >/= 37/56 for decreased fall risk    Status  New    Target Date  03/01/19      PT LONG TERM GOAL #3   Title  timed up and go < 13 sec for decreased fall risk    Status  New    Target Date  03/01/19      PT LONG TERM GOAL #4   Title  report no falls for at least 2 weeks for improved function    Status  New    Target Date  03/01/19             Plan - 01/18/19 1256    Clinical Impression Statement  Pt is a 78 y/o male who presents to OPPT for gait disturbances and decreased balance.  Pt still currently in process of medical testing to determine possible causes of symptoms.  Pt demonstrates gait abnormalities, decreased strength and balance affecting functional mobility.    Personal Factors and Comorbidities  Age;Comorbidity 3+    Comorbidities  CVA, multiple risk factors for CVA, cervical myelopathy    Examination-Activity Limitations  Transfers;Stand;Stairs;Locomotion Level    Examination-Participation Restrictions  Meal Prep    Stability/Clinical Decision Making  Unstable/Unpredictable    Clinical Decision Making  High    Rehab Potential  Fair    PT Frequency  2x / week    PT Duration  6 weeks    PT Treatment/Interventions  ADLs/Self Care Home Management;Cryotherapy;Electrical Stimulation;Gait training;Stair training;Functional mobility training;Therapeutic activities;Therapeutic exercise;Balance training;Patient/family education;Neuromuscular re-education;Vestibular    PT Next Visit Plan  OTAGO for HEP    Consulted and Agree with Plan of Care  Patient       Patient will benefit from skilled therapeutic intervention in order to improve the following deficits and impairments:  Abnormal gait, Difficulty walking, Decreased strength, Decreased mobility, Decreased balance  Visit Diagnosis: Unsteadiness on feet - Plan: PT plan of care cert/re-cert  Repeated falls - Plan: PT plan of care  cert/re-cert  Muscle weakness (generalized) - Plan: PT plan of care cert/re-cert     Problem List Patient Active Problem List   Diagnosis Date Noted  . Cervical spondylosis 01/17/2019  . Increase in serum creatinine from prior measurement 01/16/2019  . Normocytic anemia 01/16/2019  . Trigger finger, right ring finger 10/27/2018  . Great toe pain, right 10/27/2018  . Renal insufficiency 06/29/2018  . At moderate risk for fall 06/29/2018  . Ambulates with cane 06/29/2018  . Memory difficulties 06/02/2018  . Diabetic autonomic neuropathy associated with type 2 diabetes mellitus ([place]) 06/02/2018  . Status post lumbar spinal fusion 06/02/2018  . S/P CABG x 5 06/02/2018  . Chronic obstructive pulmonary disease ([place]) 06/02/2018  . Shortness of breath 06/02/2018  . Diabetic nephropathy associated with type 2 diabetes mellitus ([place]) 06/02/2018  . Uncontrolled type 2 diabetes mellitus with autonomic neuropathy ([place]) 06/02/2018  . Tachycardia with heart rate 100-120 beats per minute 06/02/2018  . Acromioclavicular arthrosis 06/02/2018  .  Diabetic retinopathy ([place])   . Coronary artery disease   . Carotid arterial disease ([place])       Laureen Abrahams, PT, DPT 01/18/19 1:19 PM    [facility] [place], [place], [address] Phone: [phone]   Fax:  [phone]  Name: Kelly Williams MRN: [id number] Date of Birth: January 13, 1941

## 2019-01-19 NOTE — Telephone Encounter (Signed)
Patient will stop by on Monday after his appointment with radiology to pick up samples. [place] staff aware.

## 2019-01-21 ENCOUNTER — Telehealth: Payer: Self-pay | Admitting: Physician Assistant

## 2019-01-21 NOTE — Telephone Encounter (Signed)
[employer] Form pre-filled and signed for Kelly Williams, if you can complete the helathcare provider sections I left blank and fax Thanks!

## 2019-01-22 ENCOUNTER — Ambulatory Visit (INDEPENDENT_AMBULATORY_CARE_PROVIDER_SITE_OTHER): Payer: Medicare Other

## 2019-01-22 ENCOUNTER — Other Ambulatory Visit: Payer: Self-pay

## 2019-01-22 DIAGNOSIS — G959 Disease of spinal cord, unspecified: Secondary | ICD-10-CM | POA: Diagnosis not present

## 2019-01-22 DIAGNOSIS — M47812 Spondylosis without myelopathy or radiculopathy, cervical region: Secondary | ICD-10-CM

## 2019-01-22 NOTE — Telephone Encounter (Signed)
I have faxed the form back to lilly for patient assistance. Patient was going to see about getting some samples until her heard from the patient assistance. Will hold onto from for a few weeks to make sure this is completed in a timely manner.

## 2019-01-24 NOTE — Telephone Encounter (Signed)
Patient has a appointment in the building on 01/26/2019 and he will pick up samples at that time.

## 2019-01-25 NOTE — Telephone Encounter (Signed)
5 boxes of Trulicity  Lot: [id number] C Expiration: 04/01/2020

## 2019-01-26 ENCOUNTER — Ambulatory Visit (INDEPENDENT_AMBULATORY_CARE_PROVIDER_SITE_OTHER): Payer: Medicare Other | Admitting: Physical Therapy

## 2019-01-26 ENCOUNTER — Other Ambulatory Visit: Payer: Self-pay

## 2019-01-26 ENCOUNTER — Encounter: Payer: Self-pay | Admitting: Physical Therapy

## 2019-01-26 ENCOUNTER — Ambulatory Visit: Payer: Medicare Other | Admitting: Physician Assistant

## 2019-01-26 DIAGNOSIS — R2681 Unsteadiness on feet: Secondary | ICD-10-CM

## 2019-01-26 DIAGNOSIS — M6281 Muscle weakness (generalized): Secondary | ICD-10-CM

## 2019-01-26 DIAGNOSIS — R296 Repeated falls: Secondary | ICD-10-CM

## 2019-01-26 NOTE — Therapy (Signed)
[place], [place], [address] Phone: [phone]   Fax:  [phone]  Physical Therapy Treatment  Patient Details  Name: Kelly Williams MRN: [id number] Date of Birth: 1941-02-08 Referring Provider (PT): Silverio Decamp, MD   Encounter Date: 01/26/2019  PT End of Session - 01/26/19 1137    Visit Number  2    Number of Visits  12    Date for PT Re-Evaluation  03/01/19    PT Start Time  1058    PT Stop Time  1139    PT Time Calculation (min)  41 min    Equipment Utilized During Treatment  Gait belt    Activity Tolerance  Patient tolerated treatment well    Behavior During Therapy  [facility] for tasks assessed/performed       Past Medical History:  Diagnosis Date  . Alcohol abuse   . Alcohol abuse   . Carotid arterial disease ([place])   . COPD (chronic obstructive pulmonary disease) ([place])   . Coronary artery disease   . Diabetes mellitus without complication ([place])   . Diabetic retinopathy ([place])   . Hyperlipidemia   . Hypertension   . Normocytic anemia 01/16/2019  . Stroke [facility])     Past Surgical History:  Procedure Laterality Date  . BACK SURGERY    . CAROTID ENDARTERECTOMY Right   . CORONARY ARTERY BYPASS GRAFT    . VASECTOMY      There were no vitals filed for this visit.  Subjective Assessment - 01/26/19 1058    Subjective  had MRI of his neck; hasn't heard results yet.    Patient Stated Goals  "get some feeling back in my feet"    Currently in Pain?  No/denies                       [facility] Adult PT Treatment/Exercise - 01/26/19 1134      Exercises   Exercises  Lumbar      Lumbar Exercises: Aerobic   Nustep  L4x6 min          Balance Exercises - 01/26/19 1101      OTAGO PROGRAM   Head Movements  Sitting;5 reps    Neck Movements  Sitting;5 reps    Back Extension  Standing;5 reps    Trunk Movements  Standing;5 reps    Ankle Movements  Sitting;10 reps     Knee Extensor  10 reps;Weight (comment)   2#   Knee Flexor  10 reps;Weight (comment)   2#   Hip ABductor  10 reps;Weight (comment)   2#   Ankle Plantorflexors  20 reps, support    Ankle Dorsiflexors  20 reps, support    Knee Bends  10 reps, support    Backwards Walking  Support    Walking and Turning Around  Assistive device    Sideways Walking  Assistive device    Tandem Stance  10 seconds, support    Tandem Walk  Support    One Leg Stand  10 seconds, support    Heel Walking  Support    Toe Walk  Support    Sit to Stand  10 reps, bilateral support        PT Education - 01/26/19 1137    Education Details  OTAGO    Person(s) Educated  Patient    Methods  Explanation;Handout;Demonstration    Comprehension  Verbalized understanding;Returned demonstration;Need further instruction  PT Long Term Goals - 01/18/19 1312      PT LONG TERM GOAL #1   Title  independent with HEP    Status  New    Target Date  03/01/19      PT LONG TERM GOAL #2   Title  improve BERG balance score to >/= 37/56 for decreased fall risk    Status  New    Target Date  03/01/19      PT LONG TERM GOAL #3   Title  timed up and go < 13 sec for decreased fall risk    Status  New    Target Date  03/01/19      PT LONG TERM GOAL #4   Title  report no falls for at least 2 weeks for improved function    Status  New    Target Date  03/01/19            Plan - 01/26/19 1138    Clinical Impression Statement  Pt reports no falls since last week; and OTAGO issued today for balance and strengthening.  No goals met as only 2nd visit.    Personal Factors and Comorbidities  Age;Comorbidity 3+    Comorbidities  CVA, multiple risk factors for CVA, cervical myelopathy    Examination-Activity Limitations  Transfers;Stand;Stairs;Locomotion Level    Examination-Participation Restrictions  Meal Prep    Stability/Clinical Decision Making  Unstable/Unpredictable    Rehab Potential  Fair    PT  Frequency  2x / week    PT Duration  6 weeks    PT Treatment/Interventions  ADLs/Self Care Home Management;Cryotherapy;Electrical Stimulation;Gait training;Stair training;Functional mobility training;Therapeutic activities;Therapeutic exercise;Balance training;Patient/family education;Neuromuscular re-education;Vestibular    PT Next Visit Plan  review HEP PRN, continue balance and strengthening    Consulted and Agree with Plan of Care  Patient       Patient will benefit from skilled therapeutic intervention in order to improve the following deficits and impairments:  Abnormal gait, Difficulty walking, Decreased strength, Decreased mobility, Decreased balance  Visit Diagnosis: Unsteadiness on feet  Repeated falls  Muscle weakness (generalized)     Problem List Patient Active Problem List   Diagnosis Date Noted  . Cervical spondylosis 01/17/2019  . Increase in serum creatinine from prior measurement 01/16/2019  . Normocytic anemia 01/16/2019  . Trigger finger, right ring finger 10/27/2018  . Great toe pain, right 10/27/2018  . Renal insufficiency 06/29/2018  . At moderate risk for fall 06/29/2018  . Ambulates with cane 06/29/2018  . Memory difficulties 06/02/2018  . Diabetic autonomic neuropathy associated with type 2 diabetes mellitus ([place]) 06/02/2018  . Status post lumbar spinal fusion 06/02/2018  . S/P CABG x 5 06/02/2018  . Chronic obstructive pulmonary disease ([place]) 06/02/2018  . Shortness of breath 06/02/2018  . Diabetic nephropathy associated with type 2 diabetes mellitus ([place]) 06/02/2018  . Uncontrolled type 2 diabetes mellitus with autonomic neuropathy ([place]) 06/02/2018  . Tachycardia with heart rate 100-120 beats per minute 06/02/2018  . Acromioclavicular arthrosis 06/02/2018  . Diabetic retinopathy ([place])   . Coronary artery disease   . Carotid arterial disease ([place])       Laureen Abrahams, PT, DPT 01/26/19 11:47 AM     [facility] [place], [place], [address] Phone: [phone]   Fax:  [phone]  Name: Kelly Williams MRN: [id number] Date of Birth: Feb 14, 1941

## 2019-01-30 ENCOUNTER — Other Ambulatory Visit: Payer: Self-pay

## 2019-01-30 ENCOUNTER — Ambulatory Visit (INDEPENDENT_AMBULATORY_CARE_PROVIDER_SITE_OTHER): Payer: Medicare Other | Admitting: Physical Therapy

## 2019-01-30 DIAGNOSIS — M6281 Muscle weakness (generalized): Secondary | ICD-10-CM

## 2019-01-30 DIAGNOSIS — R2681 Unsteadiness on feet: Secondary | ICD-10-CM

## 2019-01-30 DIAGNOSIS — R296 Repeated falls: Secondary | ICD-10-CM | POA: Diagnosis not present

## 2019-01-30 NOTE — Therapy (Signed)
[place], [place], [address] Phone: [phone]   Fax:  [phone]  Physical Therapy Treatment  Patient Details  Name: Kelly Williams MRN: [id number] Date of Birth: 1940/12/28 Referring Provider (PT): Silverio Decamp, MD   Encounter Date: 01/30/2019  PT End of Session - 01/30/19 1132    Visit Number  3    Number of Visits  12    Date for PT Re-Evaluation  03/01/19    PT Start Time  1103    PT Stop Time  1139    PT Time Calculation (min)  36 min       Past Medical History:  Diagnosis Date  . Alcohol abuse   . Alcohol abuse   . Carotid arterial disease ([place])   . COPD (chronic obstructive pulmonary disease) ([place])   . Coronary artery disease   . Diabetes mellitus without complication ([place])   . Diabetic retinopathy ([place])   . Hyperlipidemia   . Hypertension   . Normocytic anemia 01/16/2019  . Stroke [facility])     Past Surgical History:  Procedure Laterality Date  . BACK SURGERY    . CAROTID ENDARTERECTOMY Right   . CORONARY ARTERY BYPASS GRAFT    . VASECTOMY      There were no vitals filed for this visit.  Subjective Assessment - 01/30/19 1103    Subjective  Pt reports 1x since last visit. Can't seem to remember to do his exercises.  No changes since last visit.  No falls    Patient Stated Goals  "get some feeling back in my feet"    Currently in Pain?  No/denies         [facility] PT Assessment - 01/30/19 0001      Assessment   Medical Diagnosis  s/p lumbar fusion; gait difficulties    Referring Provider (PT)  Silverio Decamp, MD    Onset Date/Surgical Date  --   progressive x 2 years   Hand Dominance  Right    Next MD Visit  02/16/2019    Prior Therapy  following back surgery - 10-15 yrs ago      Timed Up and Go Test   Normal TUG (seconds)  13.89        [facility] Adult PT Treatment/Exercise - 01/30/19 0001      Lumbar Exercises: Seated   Other Seated Lumbar  Exercises  lap press with core engaged x 5 sec x 10 reps    Other Seated Lumbar Exercises  bilat shoulder ext/row with red band and core engaged x 10      Balance Exercises - 01/30/19 1108      OTAGO PROGRAM   Head Movements  Sitting;5 reps    Neck Movements  5 reps;Sitting    Back Extension  Standing;5 reps    Ankle Movements  Sitting;10 reps    Knee Extensor  10 reps;Weight (comment)   2# on ankle    Knee Flexor  10 reps   2# at ankle   Hip ABductor  10 reps   2# at ankle   Ankle Plantorflexors  20 reps, support    Ankle Dorsiflexors  20 reps, support    Knee Bends  10 reps, support    Backwards Walking  Support    Sideways Walking  Assistive device    Tandem Stance  10 seconds, support    Tandem Walk  Support    One Leg Stand  10 seconds, support  Heel Walking  Support    Toe Walk  Support    Sit to Stand  10 reps, no support             PT Long Term Goals - 01/30/19 1126      PT LONG TERM GOAL #1   Title  independent with HEP    Status  On-going      PT LONG TERM GOAL #2   Title  improve BERG balance score to >/= 37/56 for decreased fall risk    Status  On-going      PT LONG TERM GOAL #3   Title  timed up and go < 13 sec for decreased fall risk    Status  On-going      PT LONG TERM GOAL #4   Title  report no falls for at least 2 weeks for improved function    Status  On-going            Plan - 01/30/19 1132    Clinical Impression Statement  Pt completed TUG in 13.98 sec, improved from 15.99.  Pt had difficulty with tandem stance; required UE support on counter and CGA for safety due to swaying. Encouraged pt to put a reminder note on his mirror or cabinet to assist with compliance of HEP.  Progressing towards goals.    Personal Factors and Comorbidities  Age;Comorbidity 3+    Comorbidities  CVA, multiple risk factors for CVA, cervical myelopathy    Examination-Activity Limitations  Transfers;Stand;Stairs;Locomotion Level     Examination-Participation Restrictions  Meal Prep    Stability/Clinical Decision Making  Unstable/Unpredictable    Rehab Potential  Fair    PT Frequency  2x / week    PT Duration  6 weeks    PT Treatment/Interventions  ADLs/Self Care Home Management;Cryotherapy;Electrical Stimulation;Gait training;Stair training;Functional mobility training;Therapeutic activities;Therapeutic exercise;Balance training;Patient/family education;Neuromuscular re-education;Vestibular    PT Next Visit Plan  review HEP PRN, continue balance and strengthening    Consulted and Agree with Plan of Care  Patient       Patient will benefit from skilled therapeutic intervention in order to improve the following deficits and impairments:  Abnormal gait, Difficulty walking, Decreased strength, Decreased mobility, Decreased balance  Visit Diagnosis: Unsteadiness on feet  Repeated falls  Muscle weakness (generalized)     Problem List Patient Active Problem List   Diagnosis Date Noted  . Cervical spondylosis 01/17/2019  . Increase in serum creatinine from prior measurement 01/16/2019  . Normocytic anemia 01/16/2019  . Trigger finger, right ring finger 10/27/2018  . Great toe pain, right 10/27/2018  . Renal insufficiency 06/29/2018  . At moderate risk for fall 06/29/2018  . Ambulates with cane 06/29/2018  . Memory difficulties 06/02/2018  . Diabetic autonomic neuropathy associated with type 2 diabetes mellitus ([place]) 06/02/2018  . Status post lumbar spinal fusion 06/02/2018  . S/P CABG x 5 06/02/2018  . Chronic obstructive pulmonary disease ([place]) 06/02/2018  . Shortness of breath 06/02/2018  . Diabetic nephropathy associated with type 2 diabetes mellitus ([place]) 06/02/2018  . Uncontrolled type 2 diabetes mellitus with autonomic neuropathy ([place]) 06/02/2018  . Tachycardia with heart rate 100-120 beats per minute 06/02/2018  . Acromioclavicular arthrosis 06/02/2018  . Diabetic retinopathy ([place])   . Coronary artery  disease   . Carotid arterial disease [facility])    Kerin Perna, PTA 01/30/19 11:42 AM  [place], [place], [address] Phone: [phone]   Fax:  [phone]  Name: Kelly Williams MRN: [id number] Date of Birth: 1940-09-04

## 2019-02-02 ENCOUNTER — Ambulatory Visit (INDEPENDENT_AMBULATORY_CARE_PROVIDER_SITE_OTHER): Payer: Medicare Other | Admitting: Physical Therapy

## 2019-02-02 ENCOUNTER — Other Ambulatory Visit: Payer: Self-pay

## 2019-02-02 ENCOUNTER — Encounter: Payer: Self-pay | Admitting: Physical Therapy

## 2019-02-02 DIAGNOSIS — R2681 Unsteadiness on feet: Secondary | ICD-10-CM | POA: Diagnosis not present

## 2019-02-02 DIAGNOSIS — R296 Repeated falls: Secondary | ICD-10-CM

## 2019-02-02 DIAGNOSIS — M6281 Muscle weakness (generalized): Secondary | ICD-10-CM

## 2019-02-02 NOTE — Therapy (Signed)
[place], [place], [address] Phone: [phone]   Fax:  [phone]  Physical Therapy Treatment  Patient Details  Name: Kelly Williams MRN: [id number] Date of Birth: Aug 23, 1940 Referring Provider (PT): Silverio Decamp, MD   Encounter Date: 02/02/2019  PT End of Session - 02/02/19 1012    Visit Number  4    Number of Visits  12    Date for PT Re-Evaluation  03/01/19    PT Start Time  1013    PT Stop Time  1045    PT Time Calculation (min)  32 min    Activity Tolerance  Patient tolerated treatment well;Patient limited by fatigue    Behavior During Therapy  [facility] for tasks assessed/performed       Past Medical History:  Diagnosis Date  . Alcohol abuse   . Alcohol abuse   . Carotid arterial disease ([place])   . COPD (chronic obstructive pulmonary disease) ([place])   . Coronary artery disease   . Diabetes mellitus without complication ([place])   . Diabetic retinopathy ([place])   . Hyperlipidemia   . Hypertension   . Normocytic anemia 01/16/2019  . Stroke [facility])     Past Surgical History:  Procedure Laterality Date  . BACK SURGERY    . CAROTID ENDARTERECTOMY Right   . CORONARY ARTERY BYPASS GRAFT    . VASECTOMY      There were no vitals filed for this visit.  Subjective Assessment - 02/02/19 1016    Subjective  Pt reports no falls since last visit.  He has put note on fridge, so he has now been doing them 2x/day.  He's unsure if his balance has improved.    Currently in Pain?  No/denies         [facility] PT Assessment - 02/02/19 0001      Assessment   Medical Diagnosis  s/p lumbar fusion; gait difficulties    Referring Provider (PT)  Silverio Decamp, MD    Onset Date/Surgical Date  --   progressive x 2 years   Hand Dominance  Right    Next MD Visit  02/16/2019    Prior Therapy  following back surgery - 10-15 yrs ago      [employer]   Sit to Stand  --   3 - hands on legs    Standing Unsupported with Feet Together  --   3- some swaying at 30 sec   From Standing, Reach Forward with Outstretched Arm  --   3-  5"    Turn 360 Degrees  --   1) 4.71 L, 5.70 R, some swaying with Lt.    Standing Unsupported, Alternately Place Feet on Step/Stool  --   4) 19.37    Standing on One Leg  --   1      Balance Exercises - 02/02/19 1034      Balance Exercises: Standing   Standing Eyes Closed  Wide (BOA);Solid surface;2 reps;10 secs;Narrow base of support (BOS)    SLS  Upper extremity support 2;2 reps;Eyes open;Solid surface;Upper extremity support 1;10 secs    Sidestepping  2 reps   10 ft Rt/Lt x 2   Other Standing Exercises  toe taps to 6" step x 10, 2 reps      OTAGO PROGRAM   Ankle Movements  Sitting;10 reps    Knee Extensor  10 reps   2#   Hip ABductor  10 reps  2#, 2 sets   Sit to Stand  5 reps, one support        PT Long Term Goals - 01/30/19 1126      PT LONG TERM GOAL #1   Title  independent with HEP    Status  On-going      PT LONG TERM GOAL #2   Title  improve BERG balance score to >/= 37/56 for decreased fall risk    Status  On-going      PT LONG TERM GOAL #3   Title  timed up and go < 13 sec for decreased fall risk    Status  On-going      PT LONG TERM GOAL #4   Title  report no falls for at least 2 weeks for improved function    Status  On-going            Plan - 02/02/19 1046    Clinical Impression Statement  Pt tested better with some items of the [place] today.   He fatigues quickly with standing exercises, requiring short seated rest breaks. CGA/close SBA during all standing balance activities.  Pt progressing gradually towards goals.    Personal Factors and Comorbidities  Age;Comorbidity 3+    Comorbidities  CVA, multiple risk factors for CVA, cervical myelopathy    Examination-Activity Limitations  Transfers;Stand;Stairs;Locomotion Level    Examination-Participation Restrictions  Meal Prep    Stability/Clinical Decision  Making  Unstable/Unpredictable    Rehab Potential  Fair    PT Frequency  2x / week    PT Duration  6 weeks    PT Treatment/Interventions  ADLs/Self Care Home Management;Cryotherapy;Electrical Stimulation;Gait training;Stair training;Functional mobility training;Therapeutic activities;Therapeutic exercise;Balance training;Patient/family education;Neuromuscular re-education;Vestibular    PT Next Visit Plan  review HEP PRN, continue balance and strengthening    Consulted and Agree with Plan of Care  Patient       Patient will benefit from skilled therapeutic intervention in order to improve the following deficits and impairments:  Abnormal gait, Difficulty walking, Decreased strength, Decreased mobility, Decreased balance  Visit Diagnosis: Unsteadiness on feet  Repeated falls  Muscle weakness (generalized)     Problem List Patient Active Problem List   Diagnosis Date Noted  . Cervical spondylosis 01/17/2019  . Increase in serum creatinine from prior measurement 01/16/2019  . Normocytic anemia 01/16/2019  . Trigger finger, right ring finger 10/27/2018  . Great toe pain, right 10/27/2018  . Renal insufficiency 06/29/2018  . At moderate risk for fall 06/29/2018  . Ambulates with cane 06/29/2018  . Memory difficulties 06/02/2018  . Diabetic autonomic neuropathy associated with type 2 diabetes mellitus ([place]) 06/02/2018  . Status post lumbar spinal fusion 06/02/2018  . S/P CABG x 5 06/02/2018  . Chronic obstructive pulmonary disease ([place]) 06/02/2018  . Shortness of breath 06/02/2018  . Diabetic nephropathy associated with type 2 diabetes mellitus ([place]) 06/02/2018  . Uncontrolled type 2 diabetes mellitus with autonomic neuropathy ([place]) 06/02/2018  . Tachycardia with heart rate 100-120 beats per minute 06/02/2018  . Acromioclavicular arthrosis 06/02/2018  . Diabetic retinopathy ([place])   . Coronary artery disease   . Carotid arterial disease [facility])    Kerin Perna,  PTA 02/02/19 10:52 AM   [facility] [place], [place], [address] Phone: [phone]   Fax:  [phone]  Name: Kelly Williams MRN: [id number] Date of Birth: May 06, 1940

## 2019-02-04 DIAGNOSIS — R195 Other fecal abnormalities: Secondary | ICD-10-CM | POA: Insufficient documentation

## 2019-02-04 DIAGNOSIS — R269 Unspecified abnormalities of gait and mobility: Secondary | ICD-10-CM | POA: Insufficient documentation

## 2019-02-04 DIAGNOSIS — R152 Fecal urgency: Secondary | ICD-10-CM | POA: Insufficient documentation

## 2019-02-05 ENCOUNTER — Other Ambulatory Visit: Payer: Self-pay

## 2019-02-05 ENCOUNTER — Encounter: Payer: Self-pay | Admitting: Physical Therapy

## 2019-02-05 ENCOUNTER — Ambulatory Visit (INDEPENDENT_AMBULATORY_CARE_PROVIDER_SITE_OTHER): Payer: Medicare Other | Admitting: Physical Therapy

## 2019-02-05 DIAGNOSIS — R296 Repeated falls: Secondary | ICD-10-CM | POA: Diagnosis not present

## 2019-02-05 DIAGNOSIS — R2681 Unsteadiness on feet: Secondary | ICD-10-CM | POA: Diagnosis not present

## 2019-02-05 DIAGNOSIS — M6281 Muscle weakness (generalized): Secondary | ICD-10-CM | POA: Diagnosis not present

## 2019-02-05 NOTE — Therapy (Signed)
[place], [place], [address] Phone: [phone]   Fax:  [phone]  Physical Therapy Treatment  Patient Details  Name: Kelly Williams MRN: [id number] Date of Birth: [birth year] Referring Provider (PT): Silverio Decamp, MD   Encounter Date: 02/05/2019  PT End of Session - 02/05/19 1107    Visit Number  5    Number of Visits  12    Date for PT Re-Evaluation  03/01/19    PT Start Time  1102    PT Stop Time  1145    PT Time Calculation (min)  43 min    Equipment Utilized During Treatment  Gait belt    Activity Tolerance  Patient tolerated treatment well;Patient limited by fatigue    Behavior During Therapy  [facility] for tasks assessed/performed       Past Medical History:  Diagnosis Date  . Alcohol abuse   . Alcohol abuse   . Carotid arterial disease ([place])   . COPD (chronic obstructive pulmonary disease) ([place])   . Coronary artery disease   . Diabetes mellitus without complication ([place])   . Diabetic retinopathy ([place])   . Hyperlipidemia   . Hypertension   . Normocytic anemia 01/16/2019  . Stroke [facility])     Past Surgical History:  Procedure Laterality Date  . BACK SURGERY    . CAROTID ENDARTERECTOMY Right   . CORONARY ARTERY BYPASS GRAFT    . VASECTOMY      There were no vitals filed for this visit.  Subjective Assessment - 02/05/19 1106    Subjective  Pt arriving to therapy today reporting no pain. Pt feels like he is getting better.    Patient Stated Goals  "get some feeling back in my feet"    Currently in Pain?  No/denies                       [facility] PT Treatment/Exercise - 02/05/19 0001      Ambulation/Gait   Gait Comments  Pt with increased gait velocity when amb down slight incline on sidewalk in front of clinic, Pt with lateral lean to right when amb up incline. Pt with staggering gait pattern when attempting head turns while amb   all performed using  gait belt and CGA     Lumbar Exercises: Standing   Heel Raises  15 reps    Other Standing Lumbar Exercises  4 way hip exercises x 15 each LE with UE support, ternminal knee extension x 20 reps on R knee.     Other Standing Lumbar Exercises  Standing on BOSU ball dome up with CGA, 5 second balance hold with no assistance before reaching for the railing.           Balance Exercises - 02/05/19 1309      OTAGO PROGRAM   Walking and Turning Around  No assistive device      Balance Exercises: Standing   Turning Limitations  attempted figure 8 pattern around pillars in front of clinic with CGA and difficulty with stepping pattern.              PT Long Term Goals - 02/05/19 1135      PT LONG TERM GOAL #1   Title  independent with HEP    Status  On-going      PT LONG TERM GOAL #2   Title  improve BERG balance score to >/= 37/56 for decreased fall  risk    Status  On-going      PT LONG TERM GOAL #3   Title  timed up and go < 13 sec for decreased fall risk    Baseline  TUG: 15.2 seconds on first attempt, 12.3 seconds on 2nd attempt. (02/05/2019)    Status  Partially Met      PT LONG TERM GOAL #4   Title  report no falls for at least 2 weeks for improved function    Status  On-going            Plan - 02/05/19 1119    Clinical Impression Statement  Pt arriving to therpay reporting he feels his balance has improved. No pain reported. Gait training performed out outside sidewalk using gait belt. Pt with increased gait cadence and velocity when amb down hill. Pt also with lateral lean to R when amb up slight incline. Pt with decreased balance during head turns to right and left. Pt was issued a handout to remind him to follow up with [facility] to schedule an appointment. Pt amb with no assitive device only gait belt with CGA. Pt with increased ER of R LE with mild genu recurvatum noted on R. Continue with skilled PT progressing toward goals set.    Personal  Factors and Comorbidities  Age;Comorbidity 3+    Comorbidities  CVA, multiple risk factors for CVA, cervical myelopathy    Examination-Activity Limitations  Transfers;Stand;Stairs;Locomotion Level    Examination-Participation Restrictions  Meal Prep    Rehab Potential  Fair    PT Frequency  2x / week    PT Duration  6 weeks    PT Treatment/Interventions  ADLs/Self Care Home Management;Cryotherapy;Electrical Stimulation;Gait training;Stair training;Functional mobility training;Therapeutic activities;Therapeutic exercise;Balance training;Patient/family education;Neuromuscular re-education;Vestibular    PT Next Visit Plan  review HEP PRN, continue balance and strengthening    Consulted and Agree with Plan of Care  Patient       Patient will benefit from skilled therapeutic intervention in order to improve the following deficits and impairments:  Abnormal gait, Difficulty walking, Decreased strength, Decreased mobility, Decreased balance  Visit Diagnosis: Unsteadiness on feet  Repeated falls  Muscle weakness (generalized)     Problem List Patient Active Problem List   Diagnosis Date Noted  . Incontinence of feces with fecal urgency 02/04/2019  . Loose stools 02/04/2019  . Gait disturbance 02/04/2019  . Cervical spondylosis 01/17/2019  . Increase in serum creatinine from prior measurement 01/16/2019  . Normocytic anemia 01/16/2019  . Trigger finger, right ring finger 10/27/2018  . Great toe pain, right 10/27/2018  . Renal insufficiency 06/29/2018  . At moderate risk for fall 06/29/2018  . Ambulates with cane 06/29/2018  . Memory difficulties 06/02/2018  . Diabetic autonomic neuropathy associated with type 2 diabetes mellitus ([place]) 06/02/2018  . Status post lumbar spinal fusion 06/02/2018  . S/P CABG x 5 06/02/2018  . Chronic obstructive pulmonary disease ([place]) 06/02/2018  . Shortness of breath 06/02/2018  . Diabetic nephropathy associated with type 2 diabetes mellitus ([place])  06/02/2018  . Uncontrolled type 2 diabetes mellitus with autonomic neuropathy ([place]) 06/02/2018  . Tachycardia with heart rate 100-120 beats per minute 06/02/2018  . Acromioclavicular arthrosis 06/02/2018  . Diabetic retinopathy ([place])   . Coronary artery disease   . Carotid arterial disease ([place])     Oretha Caprice, PT 02/05/2019, 1:17 PM  [facility] [place], [place], [address] Phone: [phone]   Fax:  [phone]  Name: KYVON HU MRN: [id number] Date of Birth: Mar 10, 1941

## 2019-02-07 ENCOUNTER — Encounter: Payer: Self-pay | Admitting: Physical Therapy

## 2019-02-07 ENCOUNTER — Ambulatory Visit (INDEPENDENT_AMBULATORY_CARE_PROVIDER_SITE_OTHER): Payer: Medicare Other | Admitting: Physical Therapy

## 2019-02-07 ENCOUNTER — Other Ambulatory Visit: Payer: Self-pay

## 2019-02-07 DIAGNOSIS — R2681 Unsteadiness on feet: Secondary | ICD-10-CM | POA: Diagnosis not present

## 2019-02-07 DIAGNOSIS — M6281 Muscle weakness (generalized): Secondary | ICD-10-CM

## 2019-02-07 DIAGNOSIS — R296 Repeated falls: Secondary | ICD-10-CM

## 2019-02-07 NOTE — Therapy (Signed)
[place], [place], [address] Phone: [phone]   Fax:  [phone]  Physical Therapy Treatment  Patient Details  Name: Kelly Williams MRN: [id number] Date of Birth: 1940/05/20 Referring Provider (PT): Silverio Decamp, MD   Encounter Date: 02/07/2019  PT End of Session - 02/07/19 1140    Visit Number  6    Number of Visits  12    Date for PT Re-Evaluation  03/01/19    PT Start Time  1055    PT Stop Time  1137    PT Time Calculation (min)  42 min    Equipment Utilized During Treatment  Gait belt    Activity Tolerance  Patient tolerated treatment well;Patient limited by fatigue    Behavior During Therapy  [facility] for tasks assessed/performed       Past Medical History:  Diagnosis Date  . Alcohol abuse   . Alcohol abuse   . Carotid arterial disease ([place])   . COPD (chronic obstructive pulmonary disease) ([place])   . Coronary artery disease   . Diabetes mellitus without complication ([place])   . Diabetic retinopathy ([place])   . Hyperlipidemia   . Hypertension   . Normocytic anemia 01/16/2019  . Stroke [facility])     Past Surgical History:  Procedure Laterality Date  . BACK SURGERY    . CAROTID ENDARTERECTOMY Right   . CORONARY ARTERY BYPASS GRAFT    . VASECTOMY      There were no vitals filed for this visit.  Subjective Assessment - 02/07/19 1055    Subjective  doing well; had neurology appt scheduled to 11/17.  wants to work on balance today.    Patient Stated Goals  "get some feeling back in my feet"    Currently in Pain?  No/denies                       [facility] Adult PT Treatment/Exercise - 02/07/19 1057      Exercises   Exercises  Knee/Hip      Lumbar Exercises: Seated   Sit to Stand  10 reps   on foam   Sit to Stand Limitations  cues to decrease posterior lean      Lumbar Exercises: Supine   Bridge  20 reps;5 seconds    Other Supine Lumbar Exercises  single limb  clamshell with red theraband 2x10 bil      Knee/Hip Exercises: Seated   Long Arc Quad  Both;10 reps    Long Arc Quad Weight  --   2.5   Long Arc Quad Limitations  5 sec hold    Hamstring Curl  Both;10 reps    Hamstring Limitations  red theraband          Balance Exercises - 02/07/19 1109      Balance Exercises: Standing   Standing Eyes Opened  Foam/compliant surface;Wide (BOA);Head turns   intermittent UE support   Standing Eyes Closed  Foam/compliant surface;Wide (BOA);5 reps;20 secs    Rockerboard  Anterior/posterior;30 seconds;5 reps;Intermittent UE support             PT Long Term Goals - 02/05/19 1135      PT LONG TERM GOAL #1   Title  independent with HEP    Status  On-going      PT LONG TERM GOAL #2   Title  improve BERG balance score to >/= 37/56 for decreased fall risk    Status  On-going      PT LONG TERM GOAL #3   Title  timed up and go < 13 sec for decreased fall risk    Baseline  TUG: 15.2 seconds on first attempt, 12.3 seconds on 2nd attempt. (02/05/2019)    Status  Partially Met      PT LONG TERM GOAL #4   Title  report no falls for at least 2 weeks for improved function    Status  On-going            Plan - 02/07/19 1140    Clinical Impression Statement  Pt tolerated session well today, and demonstrates increased difficulty with balance on compliant surfaces.  Progressing well with PT.    Personal Factors and Comorbidities  Age;Comorbidity 3+    Comorbidities  CVA, multiple risk factors for CVA, cervical myelopathy    Examination-Activity Limitations  Transfers;Stand;Stairs;Locomotion Level    Examination-Participation Restrictions  Meal Prep    Rehab Potential  Fair    PT Frequency  2x / week    PT Duration  6 weeks    PT Treatment/Interventions  ADLs/Self Care Home Management;Cryotherapy;Electrical Stimulation;Gait training;Stair training;Functional mobility training;Therapeutic activities;Therapeutic exercise;Balance  training;Patient/family education;Neuromuscular re-education;Vestibular    PT Next Visit Plan  review HEP PRN, continue balance and strengthening    Consulted and Agree with Plan of Care  Patient       Patient will benefit from skilled therapeutic intervention in order to improve the following deficits and impairments:  Abnormal gait, Difficulty walking, Decreased strength, Decreased mobility, Decreased balance  Visit Diagnosis: Unsteadiness on feet  Repeated falls  Muscle weakness (generalized)     Problem List Patient Active Problem List   Diagnosis Date Noted  . Incontinence of feces with fecal urgency 02/04/2019  . Loose stools 02/04/2019  . Gait disturbance 02/04/2019  . Cervical spondylosis 01/17/2019  . Increase in serum creatinine from prior measurement 01/16/2019  . Normocytic anemia 01/16/2019  . Trigger finger, right ring finger 10/27/2018  . Great toe pain, right 10/27/2018  . Renal insufficiency 06/29/2018  . At moderate risk for fall 06/29/2018  . Ambulates with cane 06/29/2018  . Memory difficulties 06/02/2018  . Diabetic autonomic neuropathy associated with type 2 diabetes mellitus ([place]) 06/02/2018  . Status post lumbar spinal fusion 06/02/2018  . S/P CABG x 5 06/02/2018  . Chronic obstructive pulmonary disease ([place]) 06/02/2018  . Shortness of breath 06/02/2018  . Diabetic nephropathy associated with type 2 diabetes mellitus ([place]) 06/02/2018  . Uncontrolled type 2 diabetes mellitus with autonomic neuropathy ([place]) 06/02/2018  . Tachycardia with heart rate 100-120 beats per minute 06/02/2018  . Acromioclavicular arthrosis 06/02/2018  . Diabetic retinopathy ([place])   . Coronary artery disease   . Carotid arterial disease ([place])       Laureen Abrahams, PT, DPT 02/07/19 11:41 AM      [facility] [place], [place], [address] Phone: [phone]   Fax:  [phone]  Name: Kelly Williams MRN: [id number] Date of Birth: 1941/02/21

## 2019-02-12 ENCOUNTER — Encounter: Payer: Self-pay | Admitting: Physical Therapy

## 2019-02-12 ENCOUNTER — Ambulatory Visit (INDEPENDENT_AMBULATORY_CARE_PROVIDER_SITE_OTHER): Payer: Medicare Other | Admitting: Physical Therapy

## 2019-02-12 ENCOUNTER — Other Ambulatory Visit: Payer: Self-pay

## 2019-02-12 DIAGNOSIS — R296 Repeated falls: Secondary | ICD-10-CM | POA: Diagnosis not present

## 2019-02-12 DIAGNOSIS — M6281 Muscle weakness (generalized): Secondary | ICD-10-CM | POA: Diagnosis not present

## 2019-02-12 DIAGNOSIS — R2681 Unsteadiness on feet: Secondary | ICD-10-CM | POA: Diagnosis not present

## 2019-02-12 NOTE — Therapy (Signed)
[facility] [place], [place], [address] Phone: [phone]   Fax:  [phone]  Physical Therapy Treatment/Discharge Summary  Patient Details  Name: Kelly Williams MRN: [id number] Date of Birth: November 18, 1940 Referring Provider (PT): Silverio Decamp, MD   Encounter Date: 02/12/2019  PT End of Session - 02/12/19 1236    Visit Number  7    Number of Visits  12    Date for PT Re-Evaluation  03/01/19    PT Start Time  3419    PT Stop Time  1230    PT Time Calculation (min)  42 min    Equipment Utilized During Treatment  Gait belt    Activity Tolerance  Patient tolerated treatment well;Patient limited by fatigue    Behavior During Therapy  [facility] for tasks assessed/performed       Past Medical History:  Diagnosis Date  . Alcohol abuse   . Alcohol abuse   . Carotid arterial disease ([place])   . COPD (chronic obstructive pulmonary disease) ([place])   . Coronary artery disease   . Diabetes mellitus without complication ([place])   . Diabetic retinopathy ([place])   . Hyperlipidemia   . Hypertension   . Normocytic anemia 01/16/2019  . Stroke [facility])     Past Surgical History:  Procedure Laterality Date  . BACK SURGERY    . CAROTID ENDARTERECTOMY Right   . CORONARY ARTERY BYPASS GRAFT    . VASECTOMY      There were no vitals filed for this visit.  Subjective Assessment - 02/12/19 1150    Subjective  doing well, no falls or pain to report.    Patient Stated Goals  "get some feeling back in my feet"         [facility] PT Assessment - 02/12/19 1203      Assessment   Medical Diagnosis  s/p lumbar fusion; gait difficulties    Referring Provider (PT)  Silverio Decamp, MD      Berg Balance Test   Sit to Stand  Able to stand without using hands and stabilize independently    Standing Unsupported  Able to stand safely 2 minutes    Sitting with Back Unsupported but Feet Supported on Floor or Stool  Able to sit  safely and securely 2 minutes    Stand to Sit  Sits safely with minimal use of hands    Transfers  Able to transfer safely, minor use of hands    Standing Unsupported with Eyes Closed  Able to stand 10 seconds with supervision    Standing Unsupported with Feet Together  Able to place feet together independently and stand 1 minute safely    From Standing, Reach Forward with Outstretched Arm  Can reach forward >12 cm safely (5")    From Standing Position, Pick up Object from Floor  Able to pick up shoe safely and easily    From Standing Position, Turn to Look Behind Over each Shoulder  Looks behind one side only/other side shows less weight shift    Turn 360 Degrees  Able to turn 360 degrees safely one side only in 4 seconds or less    Standing Unsupported, Alternately Place Feet on Step/Stool  Able to complete 4 steps without aid or supervision    Standing Unsupported, One Foot in Front  Able to plae foot ahead of the other independently and hold 30 seconds    Standing on One Leg  Tries to lift leg/unable  to hold 3 seconds but remains standing independently    Total Score  46      Timed Up and Go Test   Normal TUG (seconds)  14.68   without cane; 10.69 with cane                  [facility] Adult PT Treatment/Exercise - 02/12/19 1221      Self-Care   Self-Care  Other Self-Care Comments    Other Self-Care Comments   discussed progress and POC; pt feels ready for d/c from PT; trialed heel wedge to see if it would help recurvatum without success - may need knee recurvatum brace to control; pt reports he likely would not wear though.      Lumbar Exercises: Aerobic   Nustep  L5 x 8 min      Lumbar Exercises: Seated   Other Seated Lumbar Exercises  hip abduction x 20 reps bil; red theraband                  PT Long Term Goals - 02/12/19 1240      PT LONG TERM GOAL #1   Title  independent with HEP    Status  Achieved      PT LONG TERM GOAL #2   Title  improve BERG  balance score to >/= 37/56 for decreased fall risk    Status  Achieved      PT LONG TERM GOAL #3   Title  timed up and go < 13 sec for decreased fall risk    Status  Achieved      PT LONG TERM GOAL #4   Title  report no falls for at least 2 weeks for improved function    Status  Achieved            Plan - 02/12/19 1240    Clinical Impression Statement  Pt has met all goals and is ready for d/c at this time.  Recommended he continue with current HEP, and discussed community fitness options with pt.    Personal Factors and Comorbidities  Age;Comorbidity 3+    Comorbidities  CVA, multiple risk factors for CVA, cervical myelopathy    Examination-Activity Limitations  Transfers;Stand;Stairs;Locomotion Level    Examination-Participation Restrictions  Meal Prep    Rehab Potential  Fair    PT Frequency  2x / week    PT Duration  6 weeks    PT Treatment/Interventions  ADLs/Self Care Home Management;Cryotherapy;Electrical Stimulation;Gait training;Stair training;Functional mobility training;Therapeutic activities;Therapeutic exercise;Balance training;Patient/family education;Neuromuscular re-education;Vestibular    PT Next Visit Plan  d/c PT today    Consulted and Agree with Plan of Care  Patient       Patient will benefit from skilled therapeutic intervention in order to improve the following deficits and impairments:  Abnormal gait, Difficulty walking, Decreased strength, Decreased mobility, Decreased balance  Visit Diagnosis: Unsteadiness on feet  Repeated falls  Muscle weakness (generalized)     Problem List Patient Active Problem List   Diagnosis Date Noted  . Incontinence of feces with fecal urgency 02/04/2019  . Loose stools 02/04/2019  . Gait disturbance 02/04/2019  . Cervical spondylosis 01/17/2019  . Increase in serum creatinine from prior measurement 01/16/2019  . Normocytic anemia 01/16/2019  . Trigger finger, right ring finger 10/27/2018  . Great toe pain,  right 10/27/2018  . Renal insufficiency 06/29/2018  . At moderate risk for fall 06/29/2018  . Ambulates with cane 06/29/2018  . Memory difficulties 06/02/2018  .  Diabetic autonomic neuropathy associated with type 2 diabetes mellitus ([place]) 06/02/2018  . Status post lumbar spinal fusion 06/02/2018  . S/P CABG x 5 06/02/2018  . Chronic obstructive pulmonary disease ([place]) 06/02/2018  . Shortness of breath 06/02/2018  . Diabetic nephropathy associated with type 2 diabetes mellitus ([place]) 06/02/2018  . Uncontrolled type 2 diabetes mellitus with autonomic neuropathy ([place]) 06/02/2018  . Tachycardia with heart rate 100-120 beats per minute 06/02/2018  . Acromioclavicular arthrosis 06/02/2018  . Diabetic retinopathy ([place])   . Coronary artery disease   . Carotid arterial disease ([place])       Laureen Abrahams, PT, DPT 02/12/19 12:44 PM     [facility] [place], [place], [address] Phone: [phone]   Fax:  [phone]  Name: Kelly Williams MRN: [id number] Date of Birth: 07/30/1940      PHYSICAL THERAPY DISCHARGE SUMMARY  Visits from Start of Care: 7  Current functional level related to goals / functional outcomes: See above   Remaining deficits: See above   Education / Equipment: HEP  Plan: Patient agrees to discharge.  Patient goals were met. Patient is being discharged due to meeting the stated rehab goals.  ?????    Laureen Abrahams, PT, DPT 02/12/19 12:45 PM  [facility] [place], [place] [address]  [phone] (office) [phone] (fax)

## 2019-02-15 ENCOUNTER — Encounter: Payer: Medicare Other | Admitting: Physical Therapy

## 2019-02-16 ENCOUNTER — Institutional Professional Consult (permissible substitution): Payer: Medicare Other | Admitting: Sports Medicine

## 2019-02-19 ENCOUNTER — Encounter: Payer: Medicare Other | Admitting: Physical Therapy

## 2019-02-22 ENCOUNTER — Encounter: Payer: Medicare Other | Admitting: Physical Therapy

## 2019-03-20 ENCOUNTER — Encounter: Payer: Self-pay | Admitting: Neurology

## 2019-03-20 ENCOUNTER — Ambulatory Visit: Payer: Medicare Other | Admitting: Neurology

## 2019-03-20 ENCOUNTER — Telehealth: Payer: Self-pay | Admitting: *Deleted

## 2019-03-20 NOTE — Telephone Encounter (Signed)
No showed new patient apppointment.

## 2019-04-09 ENCOUNTER — Telehealth: Payer: Self-pay | Admitting: Neurology

## 2019-04-09 ENCOUNTER — Other Ambulatory Visit: Payer: Self-pay | Admitting: Osteopathic Medicine

## 2019-04-09 DIAGNOSIS — IMO0002 Reserved for concepts with insufficient information to code with codable children: Secondary | ICD-10-CM

## 2019-04-09 DIAGNOSIS — I2510 Atherosclerotic heart disease of native coronary artery without angina pectoris: Secondary | ICD-10-CM

## 2019-04-09 DIAGNOSIS — E1143 Type 2 diabetes mellitus with diabetic autonomic (poly)neuropathy: Secondary | ICD-10-CM

## 2019-04-09 DIAGNOSIS — J439 Emphysema, unspecified: Secondary | ICD-10-CM

## 2019-04-09 NOTE — Telephone Encounter (Signed)
LVM to r/s 1/19 appt due to MD being out

## 2019-04-10 ENCOUNTER — Encounter: Payer: Self-pay | Admitting: Neurology

## 2019-04-10 NOTE — Telephone Encounter (Signed)
lvm x2- sent pt a c/a letter

## 2019-04-13 ENCOUNTER — Ambulatory Visit: Payer: Medicare Other | Admitting: Osteopathic Medicine

## 2019-04-19 ENCOUNTER — Telehealth: Payer: Self-pay | Admitting: Osteopathic Medicine

## 2019-04-19 DIAGNOSIS — IMO0001 Reserved for inherently not codable concepts without codable children: Secondary | ICD-10-CM

## 2019-04-19 DIAGNOSIS — R911 Solitary pulmonary nodule: Secondary | ICD-10-CM

## 2019-04-19 NOTE — Telephone Encounter (Signed)
Due to f/u lung nodules

## 2019-05-22 ENCOUNTER — Ambulatory Visit: Payer: Medicare Other | Admitting: Neurology

## 2019-05-30 ENCOUNTER — Ambulatory Visit (INDEPENDENT_AMBULATORY_CARE_PROVIDER_SITE_OTHER): Payer: Medicare HMO

## 2019-05-30 ENCOUNTER — Other Ambulatory Visit: Payer: Self-pay | Admitting: Sports Medicine

## 2019-05-30 ENCOUNTER — Encounter: Payer: Self-pay | Admitting: Sports Medicine

## 2019-05-30 ENCOUNTER — Other Ambulatory Visit: Payer: Self-pay

## 2019-05-30 ENCOUNTER — Ambulatory Visit (INDEPENDENT_AMBULATORY_CARE_PROVIDER_SITE_OTHER): Payer: Medicare HMO | Admitting: Sports Medicine

## 2019-05-30 DIAGNOSIS — T1490XA Injury, unspecified, initial encounter: Secondary | ICD-10-CM | POA: Diagnosis not present

## 2019-05-30 DIAGNOSIS — S4992XA Unspecified injury of left shoulder and upper arm, initial encounter: Secondary | ICD-10-CM

## 2019-05-30 DIAGNOSIS — S4991XA Unspecified injury of right shoulder and upper arm, initial encounter: Secondary | ICD-10-CM | POA: Diagnosis not present

## 2019-05-30 DIAGNOSIS — S42291A Other displaced fracture of upper end of right humerus, initial encounter for closed fracture: Secondary | ICD-10-CM | POA: Diagnosis not present

## 2019-05-30 DIAGNOSIS — M75101 Unspecified rotator cuff tear or rupture of right shoulder, not specified as traumatic: Secondary | ICD-10-CM | POA: Insufficient documentation

## 2019-05-30 MED ORDER — HYDROCODONE-ACETAMINOPHEN 5-325 MG PO TABS: 1.0000 | ORAL_TABLET | Freq: Three times a day (TID) | ORAL | 0 refills | Status: DC | PRN

## 2019-05-30 NOTE — Assessment & Plan Note (Addendum)
Kelly Williams is a pleasant 79 year old male, he had an accidental fall, he caught himself with his right arm on Friday. He felt a pop in his right shoulder. He had immediate pain and inability to AB duct the shoulder without significant pain. Looking at his x-rays, particularly the clavicular series I do see a small step-off at the medial humeral head/neck junction.  This is consistent with a nondisplaced humeral head fracture, certainly rotator cuff tear is possible with his high riding humeral head. Adding hydrocodone, sling. After approximately 2 or 3 weeks we will probably need to get him into physical therapy, return to see me in 2 weeks, shoulder x-ray before visit.

## 2019-05-30 NOTE — Progress Notes (Signed)
    Procedures performed today:    None.  Independent interpretation of tests performed by another provider:   On personal review of imaging he has significant degenerative changes in the glenohumeral joint and acromioclavicular joint, I do see a step-off at the humeral head/neck junction best seen on the clavicle series, consistent with a nondisplaced fracture.  He also has a high riding humeral head that is often seen with tears of the rotator cuff.    Impression and Recommendations:    Fracture of humeral head, right, closed Kelly Williams is a pleasant 79 year old male, he had an accidental fall, he caught himself with his right arm on Friday. He felt a pop in his right shoulder. He had immediate pain and inability to AB duct the shoulder without significant pain. Looking at his x-rays, particularly the clavicular series I do see a small step-off at the medial humeral head/neck junction.  This is consistent with a nondisplaced humeral head fracture, certainly rotator cuff tear is possible with his high riding humeral head. Adding hydrocodone, sling. After approximately 2 or 3 weeks we will probably need to get him into physical therapy, return to see me in 2 weeks, shoulder x-ray before visit.     ___________________________________________ Gwen Her. Dianah Field, M.D., ABFM., CAQSM. Primary Care and [place] Instructor of [employer] [facility] [employer]

## 2019-06-06 ENCOUNTER — Telehealth: Payer: Self-pay | Admitting: Sports Medicine

## 2019-06-06 DIAGNOSIS — S42291A Other displaced fracture of upper end of right humerus, initial encounter for closed fracture: Secondary | ICD-10-CM

## 2019-06-06 MED ORDER — HYDROCODONE-ACETAMINOPHEN 5-325 MG PO TABS: 1.0000 | ORAL_TABLET | Freq: Three times a day (TID) | ORAL | 0 refills | Status: DC | PRN

## 2019-06-06 MED FILL — HYDROCODON-APAP 5-325: 5-325 | 10 days supply | Qty: 30 | Fill #0

## 2019-06-06 NOTE — Telephone Encounter (Signed)
PT came into office requesting a medication refill.  HYDROcodone-acetaminophen (NORCO/VICODIN) 5-325 MG tablet [id number]

## 2019-06-06 NOTE — Addendum Note (Signed)
Addended by: Silverio Decamp on: 06/06/2019 09:56 AM   Modules accepted: Orders

## 2019-06-12 ENCOUNTER — Emergency Department (INDEPENDENT_AMBULATORY_CARE_PROVIDER_SITE_OTHER): Payer: Medicare HMO

## 2019-06-12 ENCOUNTER — Other Ambulatory Visit: Payer: Self-pay

## 2019-06-12 ENCOUNTER — Emergency Department (INDEPENDENT_AMBULATORY_CARE_PROVIDER_SITE_OTHER)
Admission: EM | Admit: 2019-06-12 | Discharge: 2019-06-12 | Disposition: A | Discharge status: Home / Self Care | Payer: Medicare HMO | Source: Home / Self Care | Attending: Family Medicine | Admitting: Family Medicine

## 2019-06-12 DIAGNOSIS — M19011 Primary osteoarthritis, right shoulder: Secondary | ICD-10-CM

## 2019-06-12 DIAGNOSIS — S46911A Strain of unspecified muscle, fascia and tendon at shoulder and upper arm level, right arm, initial encounter: Secondary | ICD-10-CM

## 2019-06-12 DIAGNOSIS — S4991XA Unspecified injury of right shoulder and upper arm, initial encounter: Secondary | ICD-10-CM | POA: Diagnosis not present

## 2019-06-12 LAB — POCT FASTING CBG KUC MANUAL ENTRY: POCT Glucose (KUC): 137 mg/dL — AB (ref 70–99)

## 2019-06-12 NOTE — ED Provider Notes (Signed)
[facility]    CSN: [id number] Arrival date & time: 06/12/19  1757      History   Chief Complaint Chief Complaint  Patient presents with  . Shoulder Pain    right    HPI Kelly Williams is a 78 y.o. male.   Patient is being managed for a non-displaced right humeral head fracture by Dr. Aundria Mems.  About 3 hours ago patient lost his balance, fell sideways to his right bracing with his right outstretched arm.  He felt a popping sensation in his right shoulder.  As a result, his ability to abduct his right shoulder has deteriorated even more.  He denies other injury.  The history is provided by the patient.  Shoulder Pain Location:  Shoulder Shoulder location:  R shoulder Injury: yes   Time since incident:  3 hours Mechanism of injury: fall   Fall:    Fall occurred:  In the bathroom   Point of impact:  Outstretched arms Pain details:    Quality:  Aching   Radiates to:  Does not radiate   Severity:  Moderate   Onset quality:  Sudden   Duration:  3 hours   Timing:  Constant   Progression:  Unchanged Handedness:  Right-handed Prior injury to area:  Yes Relieved by:  None tried Worsened by:  Movement Ineffective treatments:  None tried Associated symptoms: decreased range of motion and stiffness   Associated symptoms: no numbness     Past Medical History:  Diagnosis Date  . Alcohol abuse   . Alcohol abuse   . Carotid arterial disease ([place])   . COPD (chronic obstructive pulmonary disease) ([place])   . Coronary artery disease   . Diabetes mellitus without complication ([place])   . Diabetic retinopathy ([place])   . Hyperlipidemia   . Hypertension   . Normocytic anemia 01/16/2019  . Stroke [facility])     Patient Active Problem List   Diagnosis Date Noted  . Injury of left shoulder 05/30/2019  . Fracture of humeral head, right, closed 05/30/2019  . Incontinence of feces with fecal urgency 02/04/2019  . Loose stools 02/04/2019  . Gait disturbance  02/04/2019  . Cervical spondylosis 01/17/2019  . Increase in serum creatinine from prior measurement 01/16/2019  . Normocytic anemia 01/16/2019  . Trigger finger, right ring finger 10/27/2018  . Great toe pain, right 10/27/2018  . Renal insufficiency 06/29/2018  . At moderate risk for fall 06/29/2018  . Ambulates with cane 06/29/2018  . Memory difficulties 06/02/2018  . Diabetic autonomic neuropathy associated with type 2 diabetes mellitus ([place]) 06/02/2018  . Status post lumbar spinal fusion 06/02/2018  . S/P CABG x 5 06/02/2018  . Chronic obstructive pulmonary disease ([place]) 06/02/2018  . Shortness of breath 06/02/2018  . Diabetic nephropathy associated with type 2 diabetes mellitus ([place]) 06/02/2018  . Uncontrolled type 2 diabetes mellitus with autonomic neuropathy ([place]) 06/02/2018  . Tachycardia with heart rate 100-120 beats per minute 06/02/2018  . Acromioclavicular arthrosis 06/02/2018  . Diabetic retinopathy ([place])   . Coronary artery disease   . Carotid arterial disease ([place])     Past Surgical History:  Procedure Laterality Date  . BACK SURGERY    . CAROTID ENDARTERECTOMY Right   . CORONARY ARTERY BYPASS GRAFT    . VASECTOMY         Home Medications    Prior to Admission medications   Medication Sig Start Date End Date Taking? Authorizing Provider  albuterol (PROVENTIL HFA;VENTOLIN HFA) 108 (  90 Base) MCG/ACT inhaler Inhale 1-2 puffs into the lungs every 4 (four) hours as needed for wheezing or shortness of breath (bronchospasm). 06/20/18   Trixie Dredge, PA-C  aspirin EC 81 MG tablet Take 1 tablet (81 mg total) by mouth daily. 10/27/18   Trixie Dredge, PA-C  atorvastatin (LIPITOR) 40 MG tablet Take 1 tablet (40 mg total) by mouth at bedtime. 01/18/19   Trixie Dredge, PA-C  Dulaglutide (TRULICITY) 4.74 QV/9.5GL SOPN Inject 0.75 mg into the skin once a week. 01/13/19   Trixie Dredge, PA-C  HYDROcodone-acetaminophen  (NORCO/VICODIN) 5-325 MG tablet Take 1 tablet by mouth every 8 (eight) hours as needed for moderate pain. 06/06/19   Silverio Decamp, MD  ipratropium-albuterol (DUONEB) 0.5-2.5 (3) MG/3ML SOLN Take 3 mLs by nebulization every 6 (six) hours as needed. 10/27/18   Trixie Dredge, PA-C  losartan (COZAAR) 25 MG tablet Take 1 tablet (25 mg total) by mouth every morning. 10/27/18   Trixie Dredge, PA-C  Respiratory Therapy Supplies (NEBULIZER/TUBING/MOUTHPIECE) KIT Use as directed with nebulizer treatments (Duoneb) every 4 hours prn. Dx: J44.9 06/20/18   Trixie Dredge, PA-C  Tiotropium Bromide-Olodaterol (STIOLTO RESPIMAT) 2.5-2.5 MCG/ACT AERS Inhale 2 puffs into the lungs daily. 10/27/18   Trixie Dredge, PA-C    Family History History reviewed. No pertinent family history.  Social History Social History   Tobacco Use  . Smoking status: Former Smoker    Packs/day: 1.00    Years: 10.00    Pack years: 10.00    Types: Cigarettes    Quit date: 10/12/2002    Years since quitting: 16.6  . Smokeless tobacco: Never Used  Substance Use Topics  . Alcohol use: Not Currently  . Drug use: Never     Allergies   Metformin and related   Review of Systems Review of Systems  Musculoskeletal: Positive for stiffness.  All other systems reviewed and are negative.    Physical Exam Triage Vital Signs ED Triage Vitals  Enc Vitals Group     BP 06/12/19 1807 100/65     Pulse Rate 06/12/19 1807 95     Resp 06/12/19 1807 (!) 26     Temp 06/12/19 1811 97.6 F (36.4 C)     Temp Source 06/12/19 1807 Oral     SpO2 06/12/19 1807 100 %     Weight 06/12/19 1808 135 lb (61.2 kg)     Height 06/12/19 1808 _0  (1.676 m)     Head Circumference --      Peak Flow --      Pain Score 06/12/19 1807 8     Pain Loc --      Pain Edu? --      Excl. in [place]? --    No data found.  Updated Vital Signs BP 100/65 (BP Location: Left Arm)   Pulse 95   Temp 97.6  F (36.4 C) (Oral)   Resp (!) 26   Ht _1  (1.676 m)   Wt 61.2 kg   SpO2 100%   BMI 21.79 kg/m   Visual Acuity Right Eye Distance:   Left Eye Distance:   Bilateral Distance:    Right Eye Near:   Left Eye Near:    Bilateral Near:     Physical Exam Vitals and nursing note reviewed.  Constitutional:      General: He is not in acute distress. HENT:     Head: Atraumatic.  Eyes:     Pupils:  Pupils are equal, round, and reactive to light.  Cardiovascular:     Rate and Rhythm: Normal rate.  Pulmonary:     Effort: Pulmonary effort is normal.  Musculoskeletal:     Right shoulder: Tenderness and bony tenderness present. No swelling, deformity, laceration or crepitus. Decreased range of motion. Decreased strength. Normal pulse.       Arms:     Comments: Diffuse right shoulder tenderness to palpation as noted on diagram.  Patient has difficulty actively abducting his right shoulder to horizontal.  Skin:    General: Skin is warm and dry.  Neurological:     Mental Status: He is alert.      UC Treatments / Results  Labs (all labs ordered are listed, but only abnormal results are displayed) Labs Reviewed  POCT FASTING CBG [place] - Abnormal; Notable for the following components:      Result Value   POCT Glucose ([facility]) 137 (*)    All other components within normal limits    EKG   Radiology DG Shoulder Right  Result Date: 06/12/2019 CLINICAL DATA:  [place], trauma to right shoulder EXAM: RIGHT SHOULDER - 2+ VIEW COMPARISON:  05/30/2019 FINDINGS: Frontal, transscapular, and axillary views of the right shoulder are obtained. Stable irregularity of the distal right clavicle abutting the acromioclavicular joint, likely sequela of prior healed trauma. No acute displaced fracture on today's exam. There is mild to moderate acromioclavicular and glenohumeral osteoarthritis, stable. Stable narrowing of the acromial humeral interval may suggest underlying rotator cuff tear. Right  chest is clear. IMPRESSION: 1. Stable osteoarthritis.  No acute fracture. Electronically Signed   By: Randa Ngo M.D.   On: 06/12/2019 18:59    Procedures Procedures (including critical care time)  Medications Ordered in UC Medications - No data to display  Initial Impression / Assessment and Plan / UC Course  I have reviewed the triage vital signs and the nursing notes.  Pertinent labs & imaging results that were available during my care of the patient were reviewed by me and considered in my medical decision making (see chart for details).    X-ray right shoulder reassuring:  No acute bony injury. Followup with Dr. Aundria Mems tomorrow at [place] Impressions(s) / UC Diagnoses   Final diagnoses:  Strain of right shoulder, initial encounter     Discharge Instructions     Wear sling until followup by Dr. Aundria Mems. Apply ice pack for 20 to 30 minutes, 3 to 4 times daily  Continue until pain and swelling decrease.     ED Prescriptions    None        Kandra Nicolas, MD 06/12/19 1919

## 2019-06-12 NOTE — ED Triage Notes (Signed)
Pt fell a couple of weeks ago, then fell again today.  Pt fell on right shoulder today.  Saw Dr T previously.

## 2019-06-12 NOTE — Discharge Instructions (Addendum)
Wear sling until followup by Dr. Aundria Mems. Apply ice pack for 20 to 30 minutes, 3 to 4 times daily  Continue until pain and swelling decrease.

## 2019-06-13 ENCOUNTER — Encounter: Payer: Self-pay | Admitting: Sports Medicine

## 2019-06-13 ENCOUNTER — Ambulatory Visit (INDEPENDENT_AMBULATORY_CARE_PROVIDER_SITE_OTHER): Payer: Medicare HMO | Admitting: Sports Medicine

## 2019-06-13 DIAGNOSIS — S46011D Strain of muscle(s) and tendon(s) of the rotator cuff of right shoulder, subsequent encounter: Secondary | ICD-10-CM

## 2019-06-13 DIAGNOSIS — S4991XD Unspecified injury of right shoulder and upper arm, subsequent encounter: Secondary | ICD-10-CM | POA: Diagnosis not present

## 2019-06-13 NOTE — Assessment & Plan Note (Addendum)
Kelly Williams returns, he had a fall about a month ago, we did suspect fractures at his acromioclavicular joint, and possibly through the neck of his humerus. He was doing okay but unfortunately had a second fall, and a reinjury. X-rays did not show much difference, he does have significant difficulty with abducting his shoulder. Because of the reinjury and significant weakness we are going to proceed with MRI to evaluate for full-thickness retracted rotator cuff tear. He is fairly active for his age and does endorse that he would be willing to go through a rotator cuff repair if needed. Ordering the MRI, continue pain medicine prescribed at urgent care. Return to see me to go over MRI results.  Complete retraction of the supraspinatus and infraspinatus tendons with 5 to 6 cm of retraction. There is also a full-thickness tear of the subscapularis with 2 to 3 cm of retraction, noted prior rotator cuff repair. There is also glenohumeral osteoarthritis. I would like him to get an opinion from Dr. Griffin Basil to see if this tear is repairable, though I am not optimistic.

## 2019-06-13 NOTE — Progress Notes (Addendum)
    Procedures performed today:    None.  Independent interpretation of tests performed by another provider:   I have personally reviewed his repeat x-rays, there has been no change.  He has a severe acromioclavicular degenerative changes.  He also has a high riding humeral head consistent with a rotator cuff tear.  Impression and Recommendations:    Rotator cuff tear, right Zariah returns, he had a fall about a month ago, we did suspect fractures at his acromioclavicular joint, and possibly through the neck of his humerus. He was doing okay but unfortunately had a second fall, and a reinjury. X-rays did not show much difference, he does have significant difficulty with abducting his shoulder. Because of the reinjury and significant weakness we are going to proceed with MRI to evaluate for full-thickness retracted rotator cuff tear. He is fairly active for his age and does endorse that he would be willing to go through a rotator cuff repair if needed. Ordering the MRI, continue pain medicine prescribed at urgent care. Return to see me to go over MRI results.  Complete retraction of the supraspinatus and infraspinatus tendons with 5 to 6 cm of retraction. There is also a full-thickness tear of the subscapularis with 2 to 3 cm of retraction, noted prior rotator cuff repair. There is also glenohumeral osteoarthritis. I would like him to get an opinion from Dr. Griffin Basil to see if this tear is repairable, though I am not optimistic.    ___________________________________________ Gwen Her. Dianah Field, M.D., ABFM., CAQSM. Primary Care and [place] Instructor of [place] [employer] [facility] [employer]

## 2019-06-14 ENCOUNTER — Encounter: Payer: Self-pay | Admitting: Family Medicine

## 2019-06-14 ENCOUNTER — Other Ambulatory Visit: Payer: Self-pay | Admitting: Family Medicine

## 2019-06-14 ENCOUNTER — Other Ambulatory Visit: Payer: Self-pay

## 2019-06-14 ENCOUNTER — Ambulatory Visit (INDEPENDENT_AMBULATORY_CARE_PROVIDER_SITE_OTHER): Payer: Medicare HMO | Admitting: Family Medicine

## 2019-06-14 VITALS — BP 158/84 | HR 84 | Ht 66.0 in | Wt 138.0 lb

## 2019-06-14 DIAGNOSIS — E1143 Type 2 diabetes mellitus with diabetic autonomic (poly)neuropathy: Secondary | ICD-10-CM

## 2019-06-14 DIAGNOSIS — E1165 Type 2 diabetes mellitus with hyperglycemia: Secondary | ICD-10-CM | POA: Diagnosis not present

## 2019-06-14 DIAGNOSIS — R0600 Dyspnea, unspecified: Secondary | ICD-10-CM | POA: Diagnosis not present

## 2019-06-14 DIAGNOSIS — I10 Essential (primary) hypertension: Secondary | ICD-10-CM | POA: Diagnosis not present

## 2019-06-14 DIAGNOSIS — IMO0002 Reserved for concepts with insufficient information to code with codable children: Secondary | ICD-10-CM

## 2019-06-14 DIAGNOSIS — R42 Dizziness and giddiness: Secondary | ICD-10-CM | POA: Insufficient documentation

## 2019-06-14 DIAGNOSIS — R0609 Other forms of dyspnea: Secondary | ICD-10-CM

## 2019-06-14 LAB — BASIC METABOLIC PANEL
BUN/Creatinine Ratio: 16 (calc) (ref 6–22)
BUN: 20 mg/dL (ref 7–25)
CO2: 27 mmol/L (ref 20–32)
Calcium: 9 mg/dL (ref 8.6–10.3)
Chloride: 105 mmol/L (ref 98–110)
Creat: 1.23 mg/dL — ABNORMAL HIGH (ref 0.70–1.18)
Glucose, Bld: 102 mg/dL — ABNORMAL HIGH (ref 65–99)
Potassium: 5.2 mmol/L (ref 3.5–5.3)
Sodium: 138 mmol/L (ref 135–146)

## 2019-06-14 LAB — CBC WITH DIFFERENTIAL/PLATELET
Absolute Monocytes: 827 cells/uL (ref 200–950)
Basophils Absolute: 132 cells/uL (ref 0–200)
Basophils Relative: 1.4 %
Eosinophils Absolute: 179 cells/uL (ref 15–500)
Eosinophils Relative: 1.9 %
HCT: 37.7 % — ABNORMAL LOW (ref 38.5–50.0)
Hemoglobin: 12.5 g/dL — ABNORMAL LOW (ref 13.2–17.1)
Lymphs Abs: 2359 cells/uL (ref 850–3900)
MCH: 31.3 pg (ref 27.0–33.0)
MCHC: 33.2 g/dL (ref 32.0–36.0)
MCV: 94.3 fL (ref 80.0–100.0)
MPV: 9.9 fL (ref 7.5–12.5)
Monocytes Relative: 8.8 %
Neutro Abs: 5903 cells/uL (ref 1500–7800)
Neutrophils Relative %: 62.8 %
Platelets: 370 10*3/uL (ref 140–400)
RBC: 4 10*6/uL — ABNORMAL LOW (ref 4.20–5.80)
RDW: 12.3 % (ref 11.0–15.0)
Total Lymphocyte: 25.1 %
WBC: 9.4 10*3/uL (ref 3.8–10.8)

## 2019-06-14 LAB — POCT GLYCOSYLATED HEMOGLOBIN (HGB A1C): Hemoglobin A1C: 5.8 % — AB (ref 4.0–5.6)

## 2019-06-14 NOTE — Assessment & Plan Note (Signed)
BP elevated today.  He has not taken losartan dose yet for today.  Instructed to take as soon as he gets home today.

## 2019-06-14 NOTE — Assessment & Plan Note (Signed)
Most recent A1c of  Lab Results  Component Value Date   HGBA1C 5.8 (A) 06/14/2019   indicates diabetes is well controlled.  he  will continue trulicity at current dose. .  Counseled on healthy, low carb diet and recommend frequent activity to help with maintaining good control of blood sugars.

## 2019-06-14 NOTE — Assessment & Plan Note (Signed)
He has some autonomic neuropathy related to his diabetes however current symptoms have been associated with some exertional dyspnea as well.  Orthostatic BP normal. Will update BMP and CBC today.  B12 and folate levels were normal within the past 6 months.  Echo and Chest xray ordered for associate dyspnea.  No additional symptoms suggestive of dysrhythmia.   Continue ambulation with cane and discussed sitting up and standing slowly to acclimate.

## 2019-06-14 NOTE — Patient Instructions (Addendum)
Great to meet you today! Stop downstairs and have labs and xray completed.  We'll be in touch with results You will be contacted to have heart ultrasound scheduled   Your diabetes is well controlled today, please continue trulicity at current dose.

## 2019-06-14 NOTE — Progress Notes (Signed)
XANG BROADEN - 79 y.o. male MRN [id number]  Date of birth: 1941/03/10  Subjective Chief Complaint  Patient presents with  . Fall    HPI Kelly Williams is a 79 y.o. male with history of HTN, T2DM, CAD s/p CABG and COPD here today with complaint of dizziness and fall.  Had fall about 2 weeks ago.  Recent urgent care and sports medicine notes reviewed. States that he felt dizzy prior to recent fall.   Reports he has had these episodes for a couple of months.  This is worse when sitting up or standing.  He denies syncope associated with this.  He has had increased shortness of breath, worse with activity.  Denies increased wheezing.  He is compliant with current inhalers.  He denies chest pain, palpitations, or nausea.  He does have some mild numbness in feet, B12 and folate levels in 12/2018 were normal.  He has checked blood sugars during episodes and this has been normal as well.   ROS:  A comprehensive ROS was completed and negative except as noted per HPI  Allergies  Allergen Reactions  . Metformin And Related Diarrhea    Past Medical History:  Diagnosis Date  . Alcohol abuse   . Alcohol abuse   . Carotid arterial disease ([place])   . COPD (chronic obstructive pulmonary disease) ([place])   . Coronary artery disease   . Diabetes mellitus without complication ([place])   . Diabetic retinopathy ([place])   . Hyperlipidemia   . Hypertension   . Normocytic anemia 01/16/2019  . Stroke [facility])     Past Surgical History:  Procedure Laterality Date  . BACK SURGERY    . CAROTID ENDARTERECTOMY Right   . CORONARY ARTERY BYPASS GRAFT    . VASECTOMY      Social History   Socioeconomic History  . Marital status: Divorced    Spouse name: Not on file  . Number of children: Not on file  . Years of education: Not on file  . Highest education level: Not on file  Occupational History  . Not on file  Tobacco Use  . Smoking status: Former Smoker    Packs/day: 1.00    Years: 10.00    Pack years:  10.00    Types: Cigarettes    Quit date: 10/12/2002    Years since quitting: 16.6  . Smokeless tobacco: Never Used  Substance and Sexual Activity  . Alcohol use: Not Currently  . Drug use: Never  . Sexual activity: Not Currently    Birth control/protection: Abstinence, Surgical  Other Topics Concern  . Not on file  Social History Narrative  . Not on file   Social Determinants of Health   Financial Resource Strain:   . Difficulty of Paying Living Expenses: Not on file  Food Insecurity:   . Worried About Charity fundraiser in the Last Year: Not on file  . Ran Out of Food in the Last Year: Not on file  Transportation Needs:   . Lack of Transportation (Medical): Not on file  . Lack of Transportation (Non-Medical): Not on file  Physical Activity:   . Days of Exercise per Week: Not on file  . Minutes of Exercise per Session: Not on file  Stress:   . Feeling of Stress : Not on file  Social Connections:   . Frequency of Communication with Friends and Family: Not on file  . Frequency of Social Gatherings with Friends and Family: Not on file  .  Attends Religious Services: Not on file  . Active Member of Clubs or Organizations: Not on file  . Attends Archivist Meetings: Not on file  . Marital Status: Not on file    No family history on file.  Health Maintenance  Topic Date Due  . OPHTHALMOLOGY EXAM  03/01/2019  . FOOT EXAM  06/02/2019  . HEMOGLOBIN A1C  12/12/2019  . TETANUS/TDAP  06/01/2025  . INFLUENZA VACCINE  Completed  . PNA vac Low Risk Adult  Completed    ----------------------------------------------------------------------------------------------------------------------------------------------------------------------------------------------------------------- Physical Exam BP (!) 170/80   Pulse 88   Ht 5\' 6"  (1.676 m)   Wt 138 lb (62.6 kg)   BMI 22.27 kg/m   Physical Exam Constitutional:      Appearance: Normal appearance.  HENT:     Head:  Normocephalic and atraumatic.     Right Ear: Tympanic membrane normal.     Left Ear: Tympanic membrane normal.  Eyes:     General: No scleral icterus. Neck:     Comments: No carotid bruit  Cardiovascular:     Rate and Rhythm: Normal rate and regular rhythm.     Heart sounds: Normal heart sounds. No murmur.  Pulmonary:     Effort: Pulmonary effort is normal.     Breath sounds: Normal breath sounds.  Musculoskeletal:     Cervical back: Neck supple.  Neurological:     General: No focal deficit present.     Mental Status: He is alert. Mental status is at baseline.     Cranial Nerves: No cranial nerve deficit.     Motor: No weakness.     Comments: Walks with cane   Psychiatric:        Mood and Affect: Mood normal.        Behavior: Behavior normal.     ------------------------------------------------------------------------------------------------------------------------------------------------------------------------------------------------------------------- Assessment and Plan  Dizziness He has some autonomic neuropathy related to his diabetes however current symptoms have been associated with some exertional dyspnea as well.  Orthostatic BP normal. Will update BMP and CBC today.  B12 and folate levels were normal within the past 6 months.  Echo and Chest xray ordered for associate dyspnea.  No additional symptoms suggestive of dysrhythmia.   Continue ambulation with cane and discussed sitting up and standing slowly to acclimate.     Type 2 diabetes mellitus with diabetic neuropathy, unspecified ([place]) Most recent A1c of  Lab Results  Component Value Date   HGBA1C 5.8 (A) 06/14/2019   indicates diabetes is well controlled.  he  will continue trulicity at current dose. .  Counseled on healthy, low carb diet and recommend frequent activity to help with maintaining good control of blood sugars.    Essential hypertension BP elevated today.  He has not taken losartan dose  yet for today.  Instructed to take as soon as he gets home today.   35 minutes spent including pre visit preparation, review of prior notes and labs, encounter with patient, counseling, care coordination and same day documentation.   This visit occurred during the SARS-CoV-2 public health emergency.  Safety protocols were in place, including screening questions prior to the visit, additional usage of staff PPE, and extensive cleaning of exam room while observing appropriate contact time as indicated for disinfecting solutions.

## 2019-06-18 ENCOUNTER — Other Ambulatory Visit: Payer: Self-pay

## 2019-06-18 ENCOUNTER — Ambulatory Visit (INDEPENDENT_AMBULATORY_CARE_PROVIDER_SITE_OTHER): Payer: Medicare HMO

## 2019-06-18 DIAGNOSIS — S46011A Strain of muscle(s) and tendon(s) of the rotator cuff of right shoulder, initial encounter: Secondary | ICD-10-CM | POA: Diagnosis not present

## 2019-06-18 DIAGNOSIS — S4991XD Unspecified injury of right shoulder and upper arm, subsequent encounter: Secondary | ICD-10-CM

## 2019-06-19 NOTE — Addendum Note (Signed)
Addended by: Silverio Decamp on: 06/19/2019 08:51 AM   Modules accepted: Orders

## 2019-06-20 ENCOUNTER — Ambulatory Visit: Payer: Medicare HMO | Admitting: Sports Medicine

## 2019-06-22 DIAGNOSIS — M25511 Pain in right shoulder: Secondary | ICD-10-CM | POA: Diagnosis not present

## 2019-06-25 ENCOUNTER — Ambulatory Visit: Payer: Medicare HMO | Admitting: Cardiology

## 2019-06-25 ENCOUNTER — Encounter: Payer: Self-pay | Admitting: Cardiology

## 2019-06-25 ENCOUNTER — Other Ambulatory Visit: Payer: Self-pay

## 2019-06-25 ENCOUNTER — Other Ambulatory Visit: Payer: Self-pay | Admitting: Sports Medicine

## 2019-06-25 VITALS — BP 153/89 | HR 98 | Temp 97.2°F | Ht 66.0 in | Wt 134.0 lb

## 2019-06-25 DIAGNOSIS — R0989 Other specified symptoms and signs involving the circulatory and respiratory systems: Secondary | ICD-10-CM | POA: Diagnosis not present

## 2019-06-25 DIAGNOSIS — Z01810 Encounter for preprocedural cardiovascular examination: Secondary | ICD-10-CM

## 2019-06-25 DIAGNOSIS — E1121 Type 2 diabetes mellitus with diabetic nephropathy: Secondary | ICD-10-CM

## 2019-06-25 DIAGNOSIS — I10 Essential (primary) hypertension: Secondary | ICD-10-CM

## 2019-06-25 DIAGNOSIS — E1169 Type 2 diabetes mellitus with other specified complication: Secondary | ICD-10-CM | POA: Diagnosis not present

## 2019-06-25 DIAGNOSIS — E785 Hyperlipidemia, unspecified: Secondary | ICD-10-CM

## 2019-06-25 DIAGNOSIS — S42291A Other displaced fracture of upper end of right humerus, initial encounter for closed fracture: Secondary | ICD-10-CM

## 2019-06-25 DIAGNOSIS — I2510 Atherosclerotic heart disease of native coronary artery without angina pectoris: Secondary | ICD-10-CM | POA: Diagnosis not present

## 2019-06-25 DIAGNOSIS — I6521 Occlusion and stenosis of right carotid artery: Secondary | ICD-10-CM

## 2019-06-25 DIAGNOSIS — N189 Chronic kidney disease, unspecified: Secondary | ICD-10-CM

## 2019-06-25 DIAGNOSIS — Z8673 Personal history of transient ischemic attack (TIA), and cerebral infarction without residual deficits: Secondary | ICD-10-CM

## 2019-06-25 MED ORDER — ASPIRIN EC 81 MG PO TBEC: 81.0000 mg | DELAYED_RELEASE_TABLET | Freq: Every day | ORAL | 3 refills | Status: DC

## 2019-06-25 MED ORDER — LOSARTAN POTASSIUM 25 MG PO TABS: 25.0000 mg | ORAL_TABLET | ORAL | 3 refills | Status: AC

## 2019-06-25 MED ORDER — HYDROCODONE-ACETAMINOPHEN 5-325 MG PO TABS: 1.0000 | ORAL_TABLET | Freq: Three times a day (TID) | ORAL | 0 refills | Status: DC | PRN

## 2019-06-25 MED ORDER — ATORVASTATIN CALCIUM 40 MG PO TABS: 40.0000 mg | ORAL_TABLET | Freq: Every day | ORAL | 3 refills | Status: DC

## 2019-06-25 MED FILL — ATORVASTATIN 40 MG TABLET: 40 | 90 days supply | Qty: 90 | Fill #0

## 2019-06-25 MED FILL — LOSARTAN POTASSIUM 25 MG TA: 25 | 90 days supply | Qty: 90 | Fill #0

## 2019-06-25 NOTE — Telephone Encounter (Signed)
Pt aware that the Rx refill has been sent to his pharmacy for him. No further questions or concerns at this time.

## 2019-06-25 NOTE — Patient Instructions (Signed)
Medication Instructions:  Restart: Aspirin 81 mg daily               Losartan 25 mg daily               Atorvastatin 40 mg daily  *If you need a refill on your cardiac medications before your next appointment, please call your pharmacy*  Lab Work: None  Testing/Procedures: Your physician has requested that you have a carotid duplex. This test is an ultrasound of the carotid arteries in your neck. It looks at blood flow through these arteries that supply the brain with blood. Allow one hour for this exam. There are no restrictions or special instructions. [place]. [address]  Follow-Up: At [facility], you and your health needs are our priority.  As part of our continuing mission to provide you with exceptional heart care, we have created designated Provider Care Teams.  These Care Teams include your primary Cardiologist (physician) and Advanced Practice Providers (APPs -  Physician Assistants and Nurse Practitioners) who all work together to provide you with the care you need, when you need it.  Your next appointment:   3 month(s)  The format for your next appointment:   In Person  Provider:   Buford Dresser, MD

## 2019-06-25 NOTE — Progress Notes (Signed)
Cardiology Office Note:    Date:  06/25/2019   ID:  [name], DOB Nov 21, 1940, MRN [id number]  PCP:  Luetta Nutting, DO  Cardiologist:  Buford Dresser, MD  Referring MD: Luetta Nutting, DO   CC: new patient evaluation for preoperative cardiovascular assessment  History of Present Illness:    Kelly Williams is a 79 y.o. male with a hx of coronary disease s/p 5V CABG, carotid disease, prior CVA (one after his CABG, others silent, no residual deficits), hypertension, COPD, type II diabetes who is seen as a new consult at the request of Luetta Nutting, DO for the evaluation and management of preoperative cardiovascular evaluation.  I reviewed his most recent notes from Dr. Zigmund Daniel, specifically focusing on visit from 06/14/19. Recent labs reviewed as well.  Planned surgery: right total shoulder replacement, date pending, Dr. Zigmund Daniel  Pertinent past cardiac history: 5V CABG, CVA, carotid stenosis s/p CEA Prior cardiac workup: echo scheduled tomorrow. Had stress test last year in [place], told it looked fine. I do not have records for this. History of valve disease: none he knows of History of CAD/PAD/CVA/TIA: CAD s/p 5V CABG [facility]), carotid disease, prior CVA History of heart failure: none History of arrhythmia: no On anticoagulation: no, only aspirin Additional history: hypertension, type II diabetes Current symptoms: no chest pain in many years. No syncope.  Functional capacity: climb stairs without chest pain. Does get winded by the top. Moves furniture, etc without issue.  Has type II diabetes. Takes trulicity and baby aspirin. Not taking any other medications. We reviewed guidelines at length today.  Never been told that he has sleep apnea. Does snore. Feels well rested during the day, not tired when he wakes up. Does enjoy daytime naps.   Quit smoking >5 years ago, did well quitting on wellbutrin. Was very depressed on chantix. Recovering  alcoholic.  Prior R CEA. Hasn't had repeat carotid dopplers. No amaurosis but has baseline macular degeneration so difficult to tell. Prior MRI 01/2019 with acute to subacute infarcts in the left occipital lobe.  Denies chest pain, shortness of breath at rest or with normal exertion. No PND, orthopnea, LE edema or unexpected weight gain. No syncope or palpitations.  Past Medical History:  Diagnosis Date  . Alcohol abuse   . Alcohol abuse   . Carotid arterial disease ([place])   . COPD (chronic obstructive pulmonary disease) ([place])   . Coronary artery disease   . Diabetes mellitus without complication ([place])   . Diabetic retinopathy ([place])   . Hyperlipidemia   . Hypertension   . Normocytic anemia 01/16/2019  . Stroke [facility])     Past Surgical History:  Procedure Laterality Date  . BACK SURGERY    . CAROTID ENDARTERECTOMY Right   . CORONARY ARTERY BYPASS GRAFT    . VASECTOMY      Current Medications: Current Outpatient Medications on File Prior to Visit  Medication Sig  . albuterol (PROVENTIL HFA;VENTOLIN HFA) 108 (90 Base) MCG/ACT inhaler Inhale 1-2 puffs into the lungs every 4 (four) hours as needed for wheezing or shortness of breath (bronchospasm).  . Dulaglutide (TRULICITY) 8.92 JJ/9.4RD SOPN Inject 0.75 mg into the skin once a week.  [name] ipratropium-albuterol (DUONEB) 0.5-2.5 (3) MG/3ML SOLN Take 3 mLs by nebulization every 6 (six) hours as needed.  [name] Respiratory Therapy Supplies (NEBULIZER/TUBING/MOUTHPIECE) KIT Use as directed with nebulizer treatments (Duoneb) every 4 hours prn. Dx: J44.9  . Tiotropium Bromide-Olodaterol (STIOLTO RESPIMAT) 2.5-2.5 MCG/ACT AERS Inhale 2 puffs  into the lungs daily. (Patient not taking: Reported on 06/25/2019)   No current facility-administered medications on file prior to visit.     Allergies:   Metformin and related   Social History   Tobacco Use  . Smoking status: Former Smoker    Packs/day: 1.00    Years: 10.00    Pack years: 10.00     Types: Cigarettes    Quit date: 10/12/2002    Years since quitting: 16.7  . Smokeless tobacco: Never Used  Substance Use Topics  . Alcohol use: Not Currently  . Drug use: Never    Family History: No pertinent family history  ROS:   Please see the history of present illness.  Additional pertinent ROS: Constitutional: Negative for chills, fever, night sweats, unintentional weight loss  HENT: Negative for ear pain and hearing loss.   Eyes: Negative for loss of vision and eye pain.  Respiratory: Negative for cough, sputum, wheezing.   Cardiovascular: See HPI. Gastrointestinal: Negative for abdominal pain, melena, and hematochezia.  Genitourinary: Negative for dysuria and hematuria.  Musculoskeletal: Negative for falls and myalgias.  Skin: Negative for itching and rash.  Neurological: Negative for focal weakness, focal sensory changes and loss of consciousness.  Endo/Heme/Allergies: Does not bruise/bleed easily.     EKGs/Labs/Other Studies Reviewed:    The following studies were reviewed today: No available cardiac studies  EKG:  EKG is personally reviewed.  The ekg ordered today demonstrates NSR  Recent Labs: 01/12/2019: ALT 17 06/14/2019: BUN 20; Creat 1.23; Hemoglobin 12.5; Platelets 370; Potassium 5.2; Sodium 138  Recent Lipid Panel    Component Value Date/Time   CHOL 206 (H) 06/01/2018 1612   TRIG 102 06/01/2018 1612   HDL 40 (L) 06/01/2018 1612   CHOLHDL 5.2 (H) 06/01/2018 1612   LDLCALC 144 (H) 06/01/2018 1612    Physical Exam:    VS:  BP (!) 153/89   Pulse 98   Temp (!) 97.2 F (36.2 C)   Ht 5' 6"  (1.676 m)   Wt 134 lb (60.8 kg)   SpO2 99%   BMI 21.63 kg/m     Wt Readings from Last 3 Encounters:  06/25/19 134 lb (60.8 kg)  06/14/19 138 lb (62.6 kg)  06/13/19 136 lb (61.7 kg)    GEN: Well nourished, well developed in no acute distress HEENT: Normal, moist mucous membranes NECK: No JVD CARDIAC: regular rhythm, normal S1 and S2, no rubs or gallops. No  murmurs. VASCULAR: Radial pulses 2+ bilaterally. Faint bilateral carotid bruits. Palpable DP pulses bilaterally RESPIRATORY:  Clear to auscultation without rales, wheezing or rhonchi  ABDOMEN: Soft, non-tender, non-distended MUSCULOSKELETAL:  Ambulates independently SKIN: Warm and dry, no edema NEUROLOGIC:  Alert and oriented x 3. No focal neuro deficits noted. PSYCHIATRIC:  Normal affect    ASSESSMENT:    1. Preop cardiovascular exam   2. Coronary artery disease involving native heart without angina pectoris, unspecified vessel or lesion type   3. Hyperlipidemia associated with type 2 diabetes mellitus ([place])   4. Diabetic nephropathy associated with type 2 diabetes mellitus ([place])   5. Hypertensive kidney disease with CKD stage III   6. Bruit   7. History of CVA (cerebrovascular accident)   8. Essential hypertension   9. Stenosis of right carotid artery    PLAN:    Preoperative cardiovascular exam: Based on available date, patient's RCRI score = 2, which carries a 10.1% 30-day risk of death, MI, or cardiac arrest. His score is due to history of  ischemic heart disease and CVA, but neither of these issues has been active in the last month or more.  The patient is not currently having active cardiac symptoms, and they can achieve >4 METs of activity.  According to ACC/AHA Guidelines, no further testing is needed.  Proceed with surgery at acceptable risk.  Our service is available as needed in the peri-operative period.   I would recommend either continuing aspirin throughout surgery, or if held, restart as soon as it is feasible from a post operative standpoint.  CAD s/p 5V CABG: denies chest pain -on aspirin 81 mg  -had not been on statin. Discussed this today. He will restart  Hypercholesterolemia, type II diabetes, chronic kidney disease: -restarting atorvastatin today, as above -discussed losartan. Restart today -on trulicity (UXL2GM)  Hypertension: -restart losartan 25 mg  daily, ok to take at night -daughter works here in [place], will check his BP and keep logs  Bruit, history of carotid stenosis, s/p R CEA, history of CVA: -carotid dopplers given bruit, unclear etiology of CVA seen on imaging last year  Cardiac risk counseling and prevention recommendations: -recommend heart healthy/Mediterranean diet, with whole grains, fruits, vegetable, fish, lean meats, nuts, and olive oil. Limit salt. -recommend moderate walking, 3-5 times/week for 30-50 minutes each session. Aim for at least 150 minutes.week. Goal should be pace of 3 miles/hours, or walking 1.5 miles in 30 minutes -recommend avoidance of tobacco products. Avoid excess alcohol.  Plan for follow up: 3 mos or sooner PRN  Buford Dresser, MD, PhD [place]  [facility]    Medication Adjustments/Labs and Tests Ordered: Current medicines are reviewed at length with the patient today.  Concerns regarding medicines are outlined above.  Orders Placed This Encounter  Procedures  . EKG 12-Lead  . VAS US CAROTID   Meds ordered this encounter  Medications  . aspirin EC 81 MG tablet    Sig: Take 1 tablet (81 mg total) by mouth daily.    Dispense:  90 tablet    Refill:  3  . atorvastatin (LIPITOR) 40 MG tablet    Sig: Take 1 tablet (40 mg total) by mouth at bedtime.    Dispense:  90 tablet    Refill:  3  . losartan (COZAAR) 25 MG tablet    Sig: Take 1 tablet (25 mg total) by mouth every morning.    Dispense:  90 tablet    Refill:  3    Patient Instructions  Medication Instructions:  Restart: Aspirin 81 mg daily               Losartan 25 mg daily               Atorvastatin 40 mg daily  *If you need a refill on your cardiac medications before your next appointment, please call your pharmacy*  Lab Work: None  Testing/Procedures: Your physician has requested that you have a carotid duplex. This test is an ultrasound of the carotid arteries in your neck. It looks at blood flow  through these arteries that supply the brain with blood. Allow one hour for this exam. There are no restrictions or special instructions. [place]. [address]  Follow-Up: At [facility], you and your health needs are our priority.  As part of our continuing mission to provide you with exceptional heart care, we have created designated Provider Care Teams.  These Care Teams include your primary Cardiologist (physician) and Advanced Practice Providers (APPs -  Physician Assistants and Nurse  Practitioners) who all work together to provide you with the care you need, when you need it.  Your next appointment:   3 month(s)  The format for your next appointment:   In Person  Provider:   Buford Dresser, MD     Signed, Buford Dresser, MD PhD 06/25/2019 7:09 PM    [place]

## 2019-06-25 NOTE — Telephone Encounter (Signed)
Pt requesting refill of hydrocodone-APAP 5-325 mg. Rx has been tee'd up and is ready for review.

## 2019-06-26 ENCOUNTER — Ambulatory Visit (HOSPITAL_BASED_OUTPATIENT_CLINIC_OR_DEPARTMENT_OTHER)
Admission: RE | Admit: 2019-06-26 | Discharge: 2019-06-26 | Disposition: A | Discharge status: Home / Self Care | Payer: Medicare HMO | Source: Ambulatory Visit | Attending: Family Medicine | Admitting: Family Medicine

## 2019-06-26 ENCOUNTER — Telehealth: Payer: Self-pay

## 2019-06-26 DIAGNOSIS — R0600 Dyspnea, unspecified: Secondary | ICD-10-CM

## 2019-06-26 DIAGNOSIS — R0609 Other forms of dyspnea: Secondary | ICD-10-CM

## 2019-06-26 DIAGNOSIS — R42 Dizziness and giddiness: Secondary | ICD-10-CM | POA: Diagnosis not present

## 2019-06-26 NOTE — Progress Notes (Signed)
  Echocardiogram 2D Echocardiogram has been performed.  Kelly Williams 06/26/2019, 3:40 PM

## 2019-06-26 NOTE — Telephone Encounter (Signed)
Pre-op evaluations notes faxed to Dr. Rich Fuchs office on 06/26/19.

## 2019-06-27 MED FILL — ACETAMINOPHEN EXTRA STRENGT: 500 | 33 days supply | Qty: 200 | Fill #0

## 2019-06-27 MED FILL — CELECOXIB 100 MG CAPS: 100 | 30 days supply | Qty: 60 | Fill #0

## 2019-07-09 ENCOUNTER — Other Ambulatory Visit: Payer: Self-pay

## 2019-07-09 ENCOUNTER — Ambulatory Visit (HOSPITAL_COMMUNITY)
Admission: RE | Admit: 2019-07-09 | Discharge: 2019-07-09 | Disposition: A | Discharge status: Home / Self Care | Payer: Medicare HMO | Source: Ambulatory Visit | Attending: Cardiology | Admitting: Cardiology

## 2019-07-09 DIAGNOSIS — R0989 Other specified symptoms and signs involving the circulatory and respiratory systems: Secondary | ICD-10-CM

## 2019-07-10 ENCOUNTER — Other Ambulatory Visit: Payer: Self-pay

## 2019-07-10 DIAGNOSIS — I6523 Occlusion and stenosis of bilateral carotid arteries: Secondary | ICD-10-CM

## 2019-07-10 NOTE — Patient Instructions (Addendum)
DUE TO COVID-19 ONLY ONE VISITOR IS ALLOWED TO COME WITH YOU AND STAY IN THE WAITING ROOM ONLY DURING PRE OP AND PROCEDURE DAY OF SURGERY. THE 1 VISITOR MAY VISIT WITH YOU AFTER SURGERY IN YOUR PRIVATE ROOM DURING VISITING HOURS ONLY!  YOU NEED TO HAVE A COVID 19 TEST ON     Saturday 03/13/2021_______ @__10 :45 am_____, THIS TEST MUST BE DONE BEFORE SURGERY, COME  [place], [address] [place] , [address].  ([place]) ONCE YOUR COVID TEST IS COMPLETED, PLEASE BEGIN THE QUARANTINE INSTRUCTIONS AS OUTLINED IN YOUR HANDOUT.                Kelly Williams     Your procedure is scheduled on: Wednesday 07/18/2019   Report to [facility] Main  Entrance    Report to admitting at  0830  AM   How to Manage Your Diabetes Before and After Surgery  Why is it important to control my blood sugar before and after surgery? . Improving blood sugar levels before and after surgery helps healing and can limit problems. . A way of improving blood sugar control is eating a healthy diet by: o  Eating less sugar and carbohydrates o  Increasing activity/exercise o  Talking with your doctor about reaching your blood sugar goals . High blood sugars (greater than 180 mg/dL) can raise your risk of infections and slow your recovery, so you will need to focus on controlling your diabetes during the weeks before surgery. . Make sure that the doctor who takes care of your diabetes knows about your planned surgery including the date and location.  How do I manage my blood sugar before surgery? . Check your blood sugar at least 4 times a day, starting 2 days before surgery, to make sure that the level is not too high or low. o Check your blood sugar the morning of your surgery when you wake up and every 2 hours until you get to the Short Stay unit. . If your blood sugar is less than 70 mg/dL, you will need to treat for low blood sugar: o Do not take insulin. o Treat a low blood sugar (less than  70 mg/dL) with  cup of clear juice (cranberry or apple), 4 glucose tablets, OR glucose gel. o Recheck blood sugar in 15 minutes after treatment (to make sure it is greater than 70 mg/dL). If your blood sugar is not greater than 70 mg/dL on recheck, call [phone] for further instructions. . Report your blood sugar to the short stay nurse when you get to Short Stay.  . If you are admitted to the hospital after surgery: o Your blood sugar will be checked by the staff and you will probably be given insulin after surgery (instead of oral diabetes medicines) to make sure you have good blood sugar levels. o The goal for blood sugar control after surgery is 80-180 mg/dL.   WHAT DO I DO ABOUT MY DIABETES MEDICATION?  [name] Do not take oral diabetes medicines (pills) the morning of surgery.   . The day of surgery, do not take other diabetes injectables, including Byetta (exenatide), Bydureon (exenatide ER), Victoza (liraglutide), or Trulicity (dulaglutide).       Call this number if you have problems the morning of surgery [phone]    Remember: Do not eat food  :After Midnight.    NO SOLID FOOD AFTER MIDNIGHT THE NIGHT PRIOR TO SURGERY. NOTHING BY MOUTH EXCEPT CLEAR LIQUIDS UNTIL  0800  am .     PLEASE FINISH Gatorade G 2 DRINK PER SURGEON ORDER  WHICH NEEDS TO BE COMPLETED AT  0800 am .   CLEAR LIQUID DIET   Foods Allowed                                                                     Foods Excluded  Coffee and tea, regular and decaf                             liquids that you cannot  Plain Jell-O any favor except red or purple                                           see through such as: Fruit ices (not with fruit pulp)                                     milk, soups, orange juice  Iced Popsicles                                    All solid food Carbonated beverages, regular and diet                                    Cranberry, grape and apple juices Sports drinks like  Gatorade Lightly seasoned clear broth or consume(fat free) Sugar, honey syrup  Sample Menu Breakfast                                Lunch                                     Supper Cranberry juice                    Beef broth                            Chicken broth Jell-O                                     Grape juice                           Apple juice Coffee or tea                        Jell-O  Popsicle                                                Coffee or tea                        Coffee or tea  _____________________________________________________________________      BRUSH YOUR TEETH MORNING OF SURGERY AND RINSE YOUR MOUTH OUT, NO CHEWING GUM CANDY OR MINTS.     Take these medicines the morning of surgery with A SIP OF WATER: use Albuterol inhale if needed, use Duoneb nebulizer if needed, use Stiolto respimat inhaler if needed and bring inhalers with you to the hospital   DO NOT [place]!                               You may not have any metal on your body including hair pins and              piercings  Do not wear jewelry, make-up, lotions, powders or perfumes, deodorant                         Men may shave face and neck.   Do not bring valuables to the hospital. [place].  Contacts, dentures or bridgework may not be worn into surgery.  Leave suitcase in the car. After surgery it may be brought to your room.                  Please read over the following fact sheets you were given: _____________________________________________________________________  [facility]- Preparing for Total Shoulder Arthroplasty    Before surgery, you can play an important role. Because skin is not sterile, your skin needs to be as free of germs as possible. You can reduce the number of germs on your skin by using the following products. . Benzoyl Peroxide  Gel o Reduces the number of germs present on the skin o Applied twice a day to shoulder area starting two days before surgery    ==================================================================  Please follow these instructions carefully:  BENZOYL PEROXIDE 5% GEL  Please do not use if you have an allergy to benzoyl peroxide.   If your skin becomes reddened/irritated stop using the benzoyl peroxide.  Starting two days before surgery, apply as follows: 1. Apply benzoyl peroxide in the morning and at night. Apply after taking a shower. If you are not taking a shower clean entire shoulder front, back, and side along with the armpit with a clean wet washcloth.  2. Place a quarter-sized dollop on your shoulder and rub in thoroughly, making sure to cover the front, back, and side of your shoulder, along with the armpit.   2 days before _x___ AM   __x__ PM              1 day before __x__ AM   _x___ PM                         3. Do this twice a day for two days.  (Last application is the  night before surgery, AFTER using the CHG soap as described below).  4. Do NOT apply benzoyl peroxide gel on the day of surgery.             - Preparing for Surgery Before surgery, you can play an important role.  Because skin is not sterile, your skin needs to be as free of germs as possible.  You can reduce the number of germs on your skin by washing with CHG (chlorahexidine gluconate) soap before surgery.  CHG is an antiseptic cleaner which kills germs and bonds with the skin to continue killing germs even after washing. Please DO NOT use if you have an allergy to CHG or antibacterial soaps.  If your skin becomes reddened/irritated stop using the CHG and inform your nurse when you arrive at Short Stay. Do not shave (including legs and underarms) for at least 48 hours prior to the first CHG shower.  You may shave your face/neck. Please follow these instructions carefully:  1.  Shower with CHG Soap  the night before surgery and the  morning of Surgery.  2.  If you choose to wash your hair, wash your hair first as usual with your  normal  shampoo.  3.  After you shampoo, rinse your hair and body thoroughly to remove the  shampoo.                           4.  Use CHG as you would any other liquid soap.  You can apply chg directly  to the skin and wash                       Gently with a scrungie or clean washcloth.  5.  Apply the CHG Soap to your body ONLY FROM THE NECK DOWN.   Do not use on face/ open                           Wound or open sores. Avoid contact with eyes, ears mouth and genitals (private parts).                       Wash face,  Genitals (private parts) with your normal soap.             6.  Wash thoroughly, paying special attention to the area where your surgery  will be performed.  7.  Thoroughly rinse your body with warm water from the neck down.  8.  DO NOT shower/wash with your normal soap after using and rinsing off  the CHG Soap.                9.  Pat yourself dry with a clean towel.            10.  Wear clean pajamas.            11.  Place clean sheets on your bed the night of your first shower and do not  sleep with pets. Day of Surgery : Do not apply any lotions/deodorants the morning of surgery.  Please wear clean clothes to the hospital/surgery center.  FAILURE TO FOLLOW THESE INSTRUCTIONS MAY RESULT IN THE CANCELLATION OF YOUR SURGERY PATIENT SIGNATURE_________________________________  NURSE SIGNATURE__________________________________  ________________________________________________________________________   Adam Phenix  An incentive spirometer is a tool that can help keep your lungs clear and active. This tool measures  how well you are filling your lungs with each breath. Taking long deep breaths may help reverse or decrease the chance of developing breathing (pulmonary) problems (especially infection) following:  A long period of time when  you are unable to move or be active. BEFORE THE PROCEDURE   If the spirometer includes an indicator to show your best effort, your nurse or respiratory therapist will set it to a desired goal.  If possible, sit up straight or lean slightly forward. Try not to slouch.  Hold the incentive spirometer in an upright position. INSTRUCTIONS FOR USE  1. Sit on the edge of your bed if possible, or sit up as far as you can in bed or on a chair. 2. Hold the incentive spirometer in an upright position. 3. Breathe out normally. 4. Place the mouthpiece in your mouth and seal your lips tightly around it. 5. Breathe in slowly and as deeply as possible, raising the piston or the ball toward the top of the column. 6. Hold your breath for 3-5 seconds or for as long as possible. Allow the piston or ball to fall to the bottom of the column. 7. Remove the mouthpiece from your mouth and breathe out normally. 8. Rest for a few seconds and repeat Steps 1 through 7 at least 10 times every 1-2 hours when you are awake. Take your time and take a few normal breaths between deep breaths. 9. The spirometer may include an indicator to show your best effort. Use the indicator as a goal to work toward during each repetition. 10. After each set of 10 deep breaths, practice coughing to be sure your lungs are clear. If you have an incision (the cut made at the time of surgery), support your incision when coughing by placing a pillow or rolled up towels firmly against it. Once you are able to get out of bed, walk around indoors and cough well. You may stop using the incentive spirometer when instructed by your caregiver.  RISKS AND COMPLICATIONS  Take your time so you do not get dizzy or light-headed.  If you are in pain, you may need to take or ask for pain medication before doing incentive spirometry. It is harder to take a deep breath if you are having pain. AFTER USE  Rest and breathe slowly and easily.  It can be  helpful to keep track of a log of your progress. Your caregiver can provide you with a simple table to help with this. If you are using the spirometer at home, follow these instructions: [place] IF:   You are having difficultly using the spirometer.  You have trouble using the spirometer as often as instructed.  Your pain medication is not giving enough relief while using the spirometer.  You develop fever of 100.5 F (38.1 C) or higher. SEEK IMMEDIATE MEDICAL CARE IF:   You cough up bloody sputum that had not been present before.  You develop fever of 102 F (38.9 C) or greater.  You develop worsening pain at or near the incision site. MAKE SURE YOU:   Understand these instructions.  Will watch your condition.  Will get help right away if you are not doing well or get worse. Document Released: 08/30/2006 Document Revised: 07/12/2011 Document Reviewed: 10/31/2006 [facility] Patient Information 2014 [place], [place].   ________________________________________________________________________

## 2019-07-11 ENCOUNTER — Encounter (HOSPITAL_COMMUNITY)
Admission: RE | Admit: 2019-07-11 | Discharge: 2019-07-11 | Disposition: A | Discharge status: Home / Self Care | Payer: Medicare HMO | Source: Ambulatory Visit | Attending: Orthopaedic Surgery | Admitting: Orthopaedic Surgery

## 2019-07-11 ENCOUNTER — Encounter (HOSPITAL_COMMUNITY): Payer: Self-pay

## 2019-07-11 ENCOUNTER — Other Ambulatory Visit: Payer: Self-pay

## 2019-07-11 DIAGNOSIS — Z87891 Personal history of nicotine dependence: Secondary | ICD-10-CM | POA: Diagnosis not present

## 2019-07-11 DIAGNOSIS — N186 End stage renal disease: Secondary | ICD-10-CM | POA: Insufficient documentation

## 2019-07-11 DIAGNOSIS — I2510 Atherosclerotic heart disease of native coronary artery without angina pectoris: Secondary | ICD-10-CM | POA: Insufficient documentation

## 2019-07-11 DIAGNOSIS — M19011 Primary osteoarthritis, right shoulder: Secondary | ICD-10-CM | POA: Insufficient documentation

## 2019-07-11 DIAGNOSIS — Z79899 Other long term (current) drug therapy: Secondary | ICD-10-CM | POA: Insufficient documentation

## 2019-07-11 DIAGNOSIS — I129 Hypertensive chronic kidney disease with stage 1 through stage 4 chronic kidney disease, or unspecified chronic kidney disease: Secondary | ICD-10-CM | POA: Insufficient documentation

## 2019-07-11 DIAGNOSIS — Z951 Presence of aortocoronary bypass graft: Secondary | ICD-10-CM | POA: Diagnosis not present

## 2019-07-11 DIAGNOSIS — Z01812 Encounter for preprocedural laboratory examination: Secondary | ICD-10-CM | POA: Diagnosis not present

## 2019-07-11 DIAGNOSIS — N189 Chronic kidney disease, unspecified: Secondary | ICD-10-CM | POA: Insufficient documentation

## 2019-07-11 DIAGNOSIS — J449 Chronic obstructive pulmonary disease, unspecified: Secondary | ICD-10-CM | POA: Insufficient documentation

## 2019-07-11 DIAGNOSIS — E1122 Type 2 diabetes mellitus with diabetic chronic kidney disease: Secondary | ICD-10-CM | POA: Diagnosis not present

## 2019-07-11 DIAGNOSIS — Z7982 Long term (current) use of aspirin: Secondary | ICD-10-CM | POA: Diagnosis not present

## 2019-07-11 DIAGNOSIS — Z8673 Personal history of transient ischemic attack (TIA), and cerebral infarction without residual deficits: Secondary | ICD-10-CM | POA: Insufficient documentation

## 2019-07-11 HISTORY — DX: Chronic kidney disease, unspecified: N18.9

## 2019-07-11 HISTORY — DX: Unspecified osteoarthritis, unspecified site: M19.90

## 2019-07-11 LAB — CBC
HCT: 40.8 % (ref 39.0–52.0)
Hemoglobin: 13.2 g/dL (ref 13.0–17.0)
MCH: 31.4 pg (ref 26.0–34.0)
MCHC: 32.4 g/dL (ref 30.0–36.0)
MCV: 96.9 fL (ref 80.0–100.0)
Platelets: 363 10*3/uL (ref 150–400)
RBC: 4.21 MIL/uL — ABNORMAL LOW (ref 4.22–5.81)
RDW: 12.8 % (ref 11.5–15.5)
WBC: 8.8 10*3/uL (ref 4.0–10.5)
nRBC: 0 % (ref 0.0–0.2)

## 2019-07-11 LAB — BASIC METABOLIC PANEL
Anion gap: 5 (ref 5–15)
BUN: 24 mg/dL — ABNORMAL HIGH (ref 8–23)
CO2: 24 mmol/L (ref 22–32)
Calcium: 8.7 mg/dL — ABNORMAL LOW (ref 8.9–10.3)
Chloride: 110 mmol/L (ref 98–111)
Creatinine, Ser: 1.25 mg/dL — ABNORMAL HIGH (ref 0.61–1.24)
GFR calc Af Amer: >60 mL/min (ref >60)
GFR calc non Af Amer: 55 mL/min — ABNORMAL LOW (ref >60)
Glucose, Bld: 132 mg/dL — ABNORMAL HIGH (ref 70–99)
Potassium: 4.6 mmol/L (ref 3.5–5.1)
Sodium: 139 mmol/L (ref 135–145)

## 2019-07-11 LAB — SURGICAL PCR SCREEN
MRSA, PCR: NEGATIVE
Staphylococcus aureus: NEGATIVE

## 2019-07-11 NOTE — Progress Notes (Signed)
   07/11/19 1038  OBSTRUCTIVE SLEEP APNEA  Have you ever been diagnosed with sleep apnea through a sleep study? No  Do you snore loudly (loud enough to be heard through closed doors)?  1  Do you often feel tired, fatigued, or sleepy during the daytime (such as falling asleep during driving or talking to someone)? 1  Has anyone observed you stop breathing during your sleep? 0  Do you have, or are you being treated for high blood pressure? 1  BMI more than 35 kg/m2? 0  Age > 74 (1-yes) 1  Male Gender (Yes=1) 1  Obstructive Sleep Apnea Score 5  Score 5 or greater  Results sent to PCP

## 2019-07-11 NOTE — Progress Notes (Addendum)
PCP - Dr. Weston Settle  LOV- 06/14/2019 epic Cardiologist - Dr. Buford Dresser  Pre-op clearance on 06/25/2019 epic 06/14/2019- lab- HgA1c epic  Chest x-ray - n/a EKG - 06/25/2019 epic Stress Test - 06/26/2019 epic ECHO - n/aCardiac Cath - 10 or 12 years ago in [place] for CABG  Sleep Study - n/a CPAP - n/a  Fasting Blood Sugar - 70-90 Checks Blood Sugar __2___ times a day  Blood Thinner Instructions:n/a Aspirin Instructions:Aspirin 81 mg.  Last Dose:Stopped on 07/10/2019  Anesthesia review:  Chart to be reviewed by Konrad Felix, PA.  Patient has a history of CAD, COPD, HTN, DM type 2, diabetic neuropathy, diabetic retinopathy, stroke in 2020 (saw neurologist in [place] until moved here 01/2019 and had no problems), CABG x 5 (10-12 years ago in [place], [place] at [facility]), and CKD (patient denies this).  Patient denies shortness of breath, fever, cough and chest pain at PAT appointment   Patient verbalized understanding of instructions that were given to them at the PAT appointment. Patient was also instructed that they will need to review over the PAT instructions again at home before surgery.

## 2019-07-13 NOTE — Progress Notes (Signed)
Anesthesia Chart Review   Case: [id number] Date/Time: 07/18/19 1045   Procedure: REVERSE SHOULDER ARTHROPLASTY (Right Shoulder)   Anesthesia type: Choice   Pre-op diagnosis: DJD RIGHT SHOULDER   Location: [facility] ROOM 06 / [facility]   Surgeons: Hiram Gash, MD      DISCUSSION:79 y.o. former smoker (10 pack years, quit 10/12/02) with h/o DM II, HTN, CAD (CABG), carotid artery stenosis s/p CEA, HLD, CKD, COPD, Stroke, right shoulder djd scheduled for above procedure 07/18/19 with Dr. Ophelia Charter.    Pt seen by cardiologist, Dr. Buford Dresser, 06/25/19 for pre-operative evaluation.  Per OV note, "Based on available date, patient's RCRI score = 2, which carries a 10.1% 30-day risk of death, MI, or cardiac arrest. His score is due to history of ischemic heart disease and CVA, but neither of these issues has been active in the last month or more. The patient is not currently having active cardiac symptoms, and they can achieve >4 METs of activity. According to ACC/AHA Guidelines, no further testing is needed.  Proceed with surgery at acceptable risk.  Our service is available as needed in the peri-operative period.   I would recommend either continuing aspirin throughout surgery, or if held, restart as soon as it is feasible from a post operative standpoint."  Anticipate pt can proceed with planned procedure barring acute status change.   VS: BP (!) 169/95   Pulse 95   Temp 36.9 C (Oral)   Resp 18   Ht _0  (1.676 m)   Wt 58.7 kg   SpO2 99%   BMI 20.90 kg/m   PROVIDERS: Luetta Nutting, DO is PCP   Buford Dresser, MD is Cardiologist  LABS: Labs reviewed: Acceptable for surgery. (all labs ordered are listed, but only abnormal results are displayed)  Labs Reviewed  CBC - Abnormal; Notable for the following components:      Result Value   RBC 4.21 (*)    All other components within normal limits  BASIC METABOLIC PANEL - Abnormal; Notable for the following components:   Glucose,  Bld 132 (*)    BUN 24 (*)    Creatinine, Ser 1.25 (*)    Calcium 8.7 (*)    GFR calc non Af Amer 55 (*)    All other components within normal limits  SURGICAL PCR SCREEN     IMAGES:   EKG: 06/25/19 Rate 98 bpm Normal sinus rhythm   CV: Echo 06/26/19 IMPRESSIONS    1. Left ventricular ejection fraction, by estimation, is 40 to 45%. The  left ventricle has mildly decreased function. The left ventricle  demonstrates regional wall motion abnormalities (see scoring  diagram/findings for description). Left ventricular  diastolic function could not be evaluated.  2. Right ventricular systolic function is normal. The right ventricular  size is normal.  3. The mitral valve is normal in structure and function. Mild mitral  valve regurgitation. No evidence of mitral stenosis.  4. The aortic valve is normal in structure and function. Aortic valve  regurgitation is not visualized. No aortic stenosis is present.  5. The inferior vena cava is normal in size with greater than 50%  respiratory variability, suggesting right atrial pressure of 3 mmHg.  Past Medical History:  Diagnosis Date  . Alcohol abuse   . Alcohol abuse   . Arthritis   . Carotid arterial disease ([place])   . Chronic kidney disease    CKD-patient denies  . COPD (chronic obstructive pulmonary disease) ([place])  uses inhalers  . Coronary artery disease   . Diabetes mellitus without complication ([place])   . Diabetic retinopathy ([place])   . Hyperlipidemia   . Hypertension   . Normocytic anemia 01/16/2019  . Stroke [facility])    around 2020    Past Surgical History:  Procedure Laterality Date  . APPENDECTOMY     as teenager  . BACK SURGERY    . CAROTID ENDARTERECTOMY Right   . CORONARY ARTERY BYPASS GRAFT     10-12 years ago in [place] at [facility] 500 MG tablet  . albuterol (PROVENTIL HFA;VENTOLIN HFA) 108 (90 Base) MCG/ACT inhaler  . aspirin  EC 81 MG tablet  . atorvastatin (LIPITOR) 40 MG tablet  . Dulaglutide (TRULICITY) 3.01 OF/9.6LG SOPN  . HYDROcodone-acetaminophen (NORCO/VICODIN) 5-325 MG tablet  . ipratropium-albuterol (DUONEB) 0.5-2.5 (3) MG/3ML SOLN  . losartan (COZAAR) 25 MG tablet  . Respiratory Therapy Supplies (NEBULIZER/TUBING/MOUTHPIECE) KIT  . Tiotropium Bromide-Olodaterol (STIOLTO RESPIMAT) 2.5-2.5 MCG/ACT AERS   No current facility-administered medications for this encounter.    Maia Plan [facility] Pre-Surgical Testing [phone] 07/13/19  4:07 PM

## 2019-07-14 ENCOUNTER — Inpatient Hospital Stay (HOSPITAL_COMMUNITY): Admission: RE | Admit: 2019-07-14 | Payer: Medicare HMO | Source: Ambulatory Visit

## 2019-07-16 ENCOUNTER — Other Ambulatory Visit (HOSPITAL_COMMUNITY)
Admission: RE | Admit: 2019-07-16 | Discharge: 2019-07-16 | Disposition: A | Discharge status: Home / Self Care | Payer: Medicare HMO | Source: Ambulatory Visit | Attending: Orthopaedic Surgery | Admitting: Orthopaedic Surgery

## 2019-07-16 DIAGNOSIS — Z01812 Encounter for preprocedural laboratory examination: Secondary | ICD-10-CM | POA: Insufficient documentation

## 2019-07-16 DIAGNOSIS — Z20822 Contact with and (suspected) exposure to covid-19: Secondary | ICD-10-CM | POA: Insufficient documentation

## 2019-07-16 LAB — SARS CORONAVIRUS 2 (TAT 6-24 HRS): SARS Coronavirus 2: NEGATIVE

## 2019-07-17 NOTE — H&P (Signed)
PREOPERATIVE H&P  Chief Complaint: DJD RIGHT SHOULDER  HPI: Kelly Williams is a 79 y.o. male who is scheduled for right  REVERSE SHOULDER ARTHROPLASTY.   Patient has a past medical history significant for CAD, COPD, HTN, DM type 2, diabetic neuropathy, diabetic retinopathy, stroke in 2020, and CABG x 5 (10-12 years ago in [place], [place] at [facility]).  The patient is a 79 year old male who has a history of a previous cuff repair on the right in a different state. He fell onto his shoulder months ago and has had worsening pain since then. He now cannot lift his arm. He states his primary care physician tried anti-inflammatories, injections and sent him for an MRI. He was sent to Dr. Griffin Basil after the MRI was completed. He has been using Hydrocodone occasionally. He uses his other arm to assist his right arm.   His symptoms are rated as moderate to severe, and have been worsening.  This is significantly impairing activities of daily living.    Please see clinic note for further details on this patient's care.    He has elected for surgical management.   Past Medical History:  Diagnosis Date  . Alcohol abuse   . Alcohol abuse   . Arthritis   . Carotid arterial disease ([place])   . Chronic kidney disease    CKD-patient denies  . COPD (chronic obstructive pulmonary disease) (HCC)    uses inhalers  . Coronary artery disease   . Diabetes mellitus without complication ([place])   . Diabetic retinopathy ([place])   . Hyperlipidemia   . Hypertension   . Normocytic anemia 01/16/2019  . Stroke [facility])    around 2020   Past Surgical History:  Procedure Laterality Date  . APPENDECTOMY     as teenager  . BACK SURGERY    . CAROTID ENDARTERECTOMY Right   . CORONARY ARTERY BYPASS GRAFT     10-12 years ago in [place] at [place] History  . Marital status: Divorced    Spouse name: Not on file  . Number of children: Not on file  .  Years of education: Not on file  . Highest education level: Not on file  Occupational History  . Not on file  Tobacco Use  . Smoking status: Former Smoker    Packs/day: 1.00    Years: 10.00    Pack years: 10.00    Types: Cigarettes    Quit date: 10/12/2002    Years since quitting: 16.7  . Smokeless tobacco: Never Used  Substance and Sexual Activity  . Alcohol use: Not Currently  . Drug use: Never  . Sexual activity: Not Currently    Birth control/protection: Abstinence, Surgical  Other Topics Concern  . Not on file  Social History Narrative  . Not on file   Social Determinants of Health   Financial Resource Strain:   . Difficulty of Paying Living Expenses:   Food Insecurity:   . Worried About Charity fundraiser in the Last Year:   . Arboriculturist in the Last Year:   Transportation Needs:   . Film/video editor (Medical):   [name] Lack of Transportation (Non-Medical):   Physical Activity:   . Days of Exercise per Week:   . Minutes of Exercise per Session:   Stress:   . Feeling of Stress :   Social Connections:   . Frequency  of Communication with Friends and Family:   . Frequency of Social Gatherings with Friends and Family:   . Attends Religious Services:   . Active Member of Clubs or Organizations:   . Attends Archivist Meetings:   [name] Marital Status:    No family history on file. Allergies  Allergen Reactions  . Metformin And Related Diarrhea   Prior to Admission medications   Medication Sig Start Date End Date Taking? Authorizing Provider  ACETAMINOPHEN EXTRA STRENGTH 500 MG tablet Take 500-1,000 mg by mouth every 6 (six) hours as needed (pain.).  06/27/19  Yes [provider]  albuterol (PROVENTIL HFA;VENTOLIN HFA) 108 (90 Base) MCG/ACT inhaler Inhale 1-2 puffs into the lungs every 4 (four) hours as needed for wheezing or shortness of breath (bronchospasm). 06/20/18  Yes Trixie Dredge, PA-C  aspirin EC 81 MG tablet Take 1 tablet  (81 mg total) by mouth daily. Patient taking differently: Take 81 mg by mouth at bedtime.  06/25/19  Yes Buford Dresser, MD  atorvastatin (LIPITOR) 40 MG tablet Take 1 tablet (40 mg total) by mouth at bedtime. 06/25/19  Yes Buford Dresser, MD  Dulaglutide (TRULICITY) 1.63 AG/5.3MI SOPN Inject 0.75 mg into the skin once a week. Patient taking differently: Inject 0.75 mg into the skin every Saturday.  01/13/19  Yes Trixie Dredge, PA-C  HYDROcodone-acetaminophen (NORCO/VICODIN) 5-325 MG tablet Take 1 tablet by mouth every 8 (eight) hours as needed for moderate pain. 06/25/19  Yes Silverio Decamp, MD  ipratropium-albuterol (DUONEB) 0.5-2.5 (3) MG/3ML SOLN Take 3 mLs by nebulization every 6 (six) hours as needed. Patient taking differently: Take 3 mLs by nebulization every 6 (six) hours as needed (COPD).  10/27/18  Yes Trixie Dredge, PA-C  losartan (COZAAR) 25 MG tablet Take 1 tablet (25 mg total) by mouth every morning. Patient taking differently: Take 25 mg by mouth at bedtime.  06/25/19  Yes Buford Dresser, MD  Tiotropium Bromide-Olodaterol (STIOLTO RESPIMAT) 2.5-2.5 MCG/ACT AERS Inhale 2 puffs into the lungs daily. Patient taking differently: Inhale 2 puffs into the lungs daily as needed (copd symptoms).  10/27/18  Yes Trixie Dredge, PA-C  Respiratory Therapy Supplies (NEBULIZER/TUBING/MOUTHPIECE) KIT Use as directed with nebulizer treatments (Duoneb) every 4 hours prn. Dx: J44.9 06/20/18   Trixie Dredge, PA-C    ROS: All other systems have been reviewed and were otherwise negative with the exception of those mentioned in the HPI and as above.  Physical Exam: General: Alert, no acute distress Cardiovascular: No pedal edema Respiratory: No cyanosis, no use of accessory musculature GI: No organomegaly, abdomen is soft and non-tender Skin: No lesions in the area of chief complaint Neurologic: Sensation intact  distally Psychiatric: Patient is competent for consent with normal mood and affect Lymphatic: No axillary or cervical lymphadenopathy  MUSCULOSKELETAL:  Right shoulder: active forward elevation to about 30, passive to about 110 without pain. External rotation to 30. Cuff strength is 4-/5 throughout, consistent with pseudoparalysis.   Imaging: X-rays and MRI demonstrate early rotator cuff arthropathy, [place] 4-A classification with irreparable cuff tear involving the supraspinatus, infraspinatus and subscapularis.   Assessment: DJD RIGHT SHOULDER  Plan: Plan for Procedure(s): REVERSE SHOULDER ARTHROPLASTY  The risks benefits and alternatives were discussed with the patient including but not limited to the risks of nonoperative treatment, versus surgical intervention including infection, bleeding, nerve injury,  blood clots, cardiopulmonary complications, morbidity, mortality, among others, and they were willing to proceed.   We additionally specifically discussed risks of axillary  nerve injury, infection, periprosthetic fracture, continued pain and longevity of implants prior to beginning procedure.    Discussion was held with the patient about discharge home after surgery. If patient stable in PACU and feels comfortable, may be discharged home after surgery. He may proceed to stay overnight for monitoring of pain control and OT consultation. The patient is planning to be discharged home with outpatient PT.  The patient acknowledged the explanation, agreed to proceed with the plan and consent was signed.   The patient has received pre-operative clearance from PCP, Dr. Luetta Nutting, and Cardiologist, Dr. Buford Dresser prior to surgery.   Operative Plan: Right reverse total shoulder arthroplasty Discharge Medications: Tylenol, Celebrex, Oxycodone, Zofran, Omeprazole DVT Prophylaxis: None. He can restart Aspirin on POD#1.  Physical Therapy: Outpatient PT Special Discharge needs:  Mullen, PA-C  07/17/2019 7:08 AM

## 2019-07-18 ENCOUNTER — Encounter (HOSPITAL_COMMUNITY): Payer: Self-pay | Admitting: Orthopaedic Surgery

## 2019-07-18 ENCOUNTER — Ambulatory Visit (HOSPITAL_COMMUNITY): Payer: Medicare HMO

## 2019-07-18 ENCOUNTER — Ambulatory Visit (HOSPITAL_COMMUNITY): Payer: Medicare HMO | Admitting: Certified Registered Nurse Anesthetist

## 2019-07-18 ENCOUNTER — Encounter (HOSPITAL_COMMUNITY)
Admission: RE | Disposition: A | Discharge status: Home / Self Care | Payer: Self-pay | Source: Other Acute Inpatient Hospital | Attending: Orthopaedic Surgery

## 2019-07-18 ENCOUNTER — Ambulatory Visit (HOSPITAL_COMMUNITY)
Admission: RE | Admit: 2019-07-18 | Discharge: 2019-07-18 | Disposition: A | Discharge status: Home / Self Care | Payer: Medicare HMO | Source: Other Acute Inpatient Hospital | Attending: Orthopaedic Surgery | Admitting: Orthopaedic Surgery

## 2019-07-18 ENCOUNTER — Ambulatory Visit (HOSPITAL_COMMUNITY): Payer: Medicare HMO | Admitting: Physician Assistant

## 2019-07-18 DIAGNOSIS — I509 Heart failure, unspecified: Secondary | ICD-10-CM | POA: Diagnosis not present

## 2019-07-18 DIAGNOSIS — E1122 Type 2 diabetes mellitus with diabetic chronic kidney disease: Secondary | ICD-10-CM | POA: Insufficient documentation

## 2019-07-18 DIAGNOSIS — Z7982 Long term (current) use of aspirin: Secondary | ICD-10-CM | POA: Diagnosis not present

## 2019-07-18 DIAGNOSIS — N189 Chronic kidney disease, unspecified: Secondary | ICD-10-CM | POA: Diagnosis not present

## 2019-07-18 DIAGNOSIS — Z79899 Other long term (current) drug therapy: Secondary | ICD-10-CM | POA: Diagnosis not present

## 2019-07-18 DIAGNOSIS — M75101 Unspecified rotator cuff tear or rupture of right shoulder, not specified as traumatic: Secondary | ICD-10-CM | POA: Diagnosis not present

## 2019-07-18 DIAGNOSIS — Z8673 Personal history of transient ischemic attack (TIA), and cerebral infarction without residual deficits: Secondary | ICD-10-CM | POA: Insufficient documentation

## 2019-07-18 DIAGNOSIS — Z96611 Presence of right artificial shoulder joint: Secondary | ICD-10-CM | POA: Diagnosis not present

## 2019-07-18 DIAGNOSIS — I2510 Atherosclerotic heart disease of native coronary artery without angina pectoris: Secondary | ICD-10-CM | POA: Diagnosis not present

## 2019-07-18 DIAGNOSIS — Z471 Aftercare following joint replacement surgery: Secondary | ICD-10-CM | POA: Diagnosis not present

## 2019-07-18 DIAGNOSIS — E785 Hyperlipidemia, unspecified: Secondary | ICD-10-CM | POA: Insufficient documentation

## 2019-07-18 DIAGNOSIS — M12811 Other specific arthropathies, not elsewhere classified, right shoulder: Secondary | ICD-10-CM | POA: Diagnosis not present

## 2019-07-18 DIAGNOSIS — I129 Hypertensive chronic kidney disease with stage 1 through stage 4 chronic kidney disease, or unspecified chronic kidney disease: Secondary | ICD-10-CM | POA: Diagnosis not present

## 2019-07-18 DIAGNOSIS — Z951 Presence of aortocoronary bypass graft: Secondary | ICD-10-CM | POA: Insufficient documentation

## 2019-07-18 DIAGNOSIS — G8918 Other acute postprocedural pain: Secondary | ICD-10-CM | POA: Diagnosis not present

## 2019-07-18 DIAGNOSIS — Z87891 Personal history of nicotine dependence: Secondary | ICD-10-CM | POA: Diagnosis not present

## 2019-07-18 DIAGNOSIS — E11319 Type 2 diabetes mellitus with unspecified diabetic retinopathy without macular edema: Secondary | ICD-10-CM | POA: Diagnosis not present

## 2019-07-18 DIAGNOSIS — E1140 Type 2 diabetes mellitus with diabetic neuropathy, unspecified: Secondary | ICD-10-CM | POA: Insufficient documentation

## 2019-07-18 DIAGNOSIS — I110 Hypertensive heart disease with heart failure: Secondary | ICD-10-CM | POA: Diagnosis not present

## 2019-07-18 DIAGNOSIS — Z4589 Encounter for adjustment and management of other implanted devices: Secondary | ICD-10-CM | POA: Diagnosis present

## 2019-07-18 DIAGNOSIS — M19011 Primary osteoarthritis, right shoulder: Secondary | ICD-10-CM | POA: Diagnosis not present

## 2019-07-18 DIAGNOSIS — J449 Chronic obstructive pulmonary disease, unspecified: Secondary | ICD-10-CM | POA: Diagnosis not present

## 2019-07-18 DIAGNOSIS — Z09 Encounter for follow-up examination after completed treatment for conditions other than malignant neoplasm: Secondary | ICD-10-CM

## 2019-07-18 HISTORY — PX: REVERSE SHOULDER ARTHROPLASTY: SHX5054

## 2019-07-18 LAB — GLUCOSE, CAPILLARY: Glucose-Capillary: 134 mg/dL — ABNORMAL HIGH (ref 70–99)

## 2019-07-18 SURGERY — ARTHROPLASTY, SHOULDER, TOTAL, REVERSE
Anesthesia: General | Site: Shoulder | Laterality: Right

## 2019-07-18 MED ORDER — ROCURONIUM BROMIDE 10 MG/ML (PF) SYRINGE
PREFILLED_SYRINGE | INTRAVENOUS | Status: AC
  Filled 2019-07-18: qty 10

## 2019-07-18 MED ORDER — STERILE WATER FOR IRRIGATION IR SOLN
Status: DC | PRN
  Administered 2019-07-18: 2000 mL

## 2019-07-18 MED ORDER — LACTATED RINGERS IV SOLN: INTRAVENOUS | Status: DC

## 2019-07-18 MED ORDER — MIDAZOLAM HCL 2 MG/2ML IJ SOLN
1.0000 mg | Freq: Once | INTRAMUSCULAR | Status: DC
  Filled 2019-07-18: qty 2

## 2019-07-18 MED ORDER — TRANEXAMIC ACID-NACL 1000-0.7 MG/100ML-% IV SOLN
1000.0000 mg | INTRAVENOUS | Status: AC
  Administered 2019-07-18: 1000 mg via INTRAVENOUS
  Filled 2019-07-18: qty 100

## 2019-07-18 MED ORDER — ROCURONIUM BROMIDE 50 MG/5ML IV SOSY
PREFILLED_SYRINGE | INTRAVENOUS | Status: DC | PRN
  Administered 2019-07-18 (×2): 10 mg via INTRAVENOUS
  Administered 2019-07-18: 60 mg via INTRAVENOUS

## 2019-07-18 MED ORDER — OXYCODONE HCL 5 MG PO TABS: ORAL_TABLET | ORAL | 0 refills | Status: AC

## 2019-07-18 MED ORDER — LIDOCAINE 2% (20 MG/ML) 5 ML SYRINGE
INTRAMUSCULAR | Status: AC
  Filled 2019-07-18: qty 5

## 2019-07-18 MED ORDER — LIDOCAINE 2% (20 MG/ML) 5 ML SYRINGE
INTRAMUSCULAR | Status: DC | PRN
  Administered 2019-07-18: 100 mg via INTRAVENOUS

## 2019-07-18 MED ORDER — PROPOFOL 10 MG/ML IV BOLUS
INTRAVENOUS | Status: AC
  Filled 2019-07-18: qty 20

## 2019-07-18 MED ORDER — ONDANSETRON HCL 4 MG PO TABS: 4.0000 mg | ORAL_TABLET | Freq: Three times a day (TID) | ORAL | 1 refills | Status: AC | PRN

## 2019-07-18 MED ORDER — ONDANSETRON HCL 4 MG/2ML IJ SOLN
INTRAMUSCULAR | Status: AC
  Filled 2019-07-18: qty 2

## 2019-07-18 MED ORDER — HYDRALAZINE HCL 20 MG/ML IJ SOLN: 10.0000 mg | Freq: Once | INTRAMUSCULAR | Status: AC

## 2019-07-18 MED ORDER — SUGAMMADEX SODIUM 200 MG/2ML IV SOLN
INTRAVENOUS | Status: DC | PRN
  Administered 2019-07-18: 200 mg via INTRAVENOUS

## 2019-07-18 MED ORDER — FENTANYL CITRATE (PF) 100 MCG/2ML IJ SOLN
INTRAMUSCULAR | Status: DC | PRN
  Administered 2019-07-18 (×2): 25 ug via INTRAVENOUS

## 2019-07-18 MED ORDER — PHENYLEPHRINE 40 MCG/ML (10ML) SYRINGE FOR IV PUSH (FOR BLOOD PRESSURE SUPPORT)
PREFILLED_SYRINGE | INTRAVENOUS | Status: DC | PRN
  Administered 2019-07-18: 120 ug via INTRAVENOUS

## 2019-07-18 MED ORDER — ACETAMINOPHEN 500 MG PO TABS: 1000.0000 mg | ORAL_TABLET | Freq: Three times a day (TID) | ORAL | 0 refills | Status: AC

## 2019-07-18 MED ORDER — FENTANYL CITRATE (PF) 100 MCG/2ML IJ SOLN: 25.0000 ug | INTRAMUSCULAR | Status: DC | PRN

## 2019-07-18 MED ORDER — 0.9 % SODIUM CHLORIDE (POUR BTL) OPTIME
TOPICAL | Status: DC | PRN
  Administered 2019-07-18: 1000 mL

## 2019-07-18 MED ORDER — CHLORHEXIDINE GLUCONATE 4 % EX LIQD
60.0000 mL | Freq: Once | CUTANEOUS | Status: AC
  Administered 2019-07-18: 4 via TOPICAL

## 2019-07-18 MED ORDER — OMEPRAZOLE 20 MG PO CPDR: 20.0000 mg | DELAYED_RELEASE_CAPSULE | Freq: Every day | ORAL | 0 refills | Status: DC

## 2019-07-18 MED ORDER — PHENYLEPHRINE HCL-NACL 10-0.9 MG/250ML-% IV SOLN
INTRAVENOUS | Status: DC | PRN
  Administered 2019-07-18: 75 ug/min via INTRAVENOUS

## 2019-07-18 MED ORDER — CELECOXIB 100 MG PO CAPS: 100.0000 mg | ORAL_CAPSULE | Freq: Two times a day (BID) | ORAL | 0 refills | Status: AC

## 2019-07-18 MED ORDER — CEFAZOLIN SODIUM-DEXTROSE 2-4 GM/100ML-% IV SOLN
2.0000 g | INTRAVENOUS | Status: AC
  Administered 2019-07-18: 2 g via INTRAVENOUS
  Filled 2019-07-18: qty 100

## 2019-07-18 MED ORDER — VANCOMYCIN HCL 1000 MG IV SOLR
INTRAVENOUS | Status: AC
  Filled 2019-07-18: qty 1000

## 2019-07-18 MED ORDER — BUPIVACAINE HCL (PF) 0.5 % IJ SOLN
INTRAMUSCULAR | Status: DC | PRN
  Administered 2019-07-18: 15 mL via PERINEURAL

## 2019-07-18 MED ORDER — DEXAMETHASONE SODIUM PHOSPHATE 10 MG/ML IJ SOLN
INTRAMUSCULAR | Status: AC
  Filled 2019-07-18: qty 1

## 2019-07-18 MED ORDER — PROPOFOL 10 MG/ML IV BOLUS
INTRAVENOUS | Status: DC | PRN
  Administered 2019-07-18: 120 mg via INTRAVENOUS

## 2019-07-18 MED ORDER — VANCOMYCIN HCL 1 G IV SOLR
INTRAVENOUS | Status: DC | PRN
  Administered 2019-07-18: 1000 mg via TOPICAL

## 2019-07-18 MED ORDER — HYDRALAZINE HCL 20 MG/ML IJ SOLN
INTRAMUSCULAR | Status: AC
  Administered 2019-07-18: 5 mg via INTRAVENOUS
  Filled 2019-07-18: qty 1

## 2019-07-18 MED ORDER — DEXAMETHASONE SODIUM PHOSPHATE 10 MG/ML IJ SOLN
INTRAMUSCULAR | Status: DC | PRN
  Administered 2019-07-18: 10 mg via INTRAVENOUS

## 2019-07-18 MED ORDER — SODIUM CHLORIDE 0.9 % IR SOLN
Status: DC | PRN
  Administered 2019-07-18: 1000 mL

## 2019-07-18 MED ORDER — ONDANSETRON HCL 4 MG/2ML IJ SOLN
INTRAMUSCULAR | Status: DC | PRN
  Administered 2019-07-18: 4 mg via INTRAVENOUS

## 2019-07-18 MED ORDER — PROMETHAZINE HCL 25 MG/ML IJ SOLN: 6.2500 mg | INTRAMUSCULAR | Status: DC | PRN

## 2019-07-18 MED ORDER — BUPIVACAINE LIPOSOME 1.3 % IJ SUSP
INTRAMUSCULAR | Status: DC | PRN
  Administered 2019-07-18: 10 mL via PERINEURAL

## 2019-07-18 MED ORDER — FENTANYL CITRATE (PF) 100 MCG/2ML IJ SOLN
INTRAMUSCULAR | Status: AC
  Filled 2019-07-18: qty 2

## 2019-07-18 MED ORDER — FENTANYL CITRATE (PF) 100 MCG/2ML IJ SOLN
50.0000 ug | Freq: Once | INTRAMUSCULAR | Status: AC
  Administered 2019-07-18: 100 ug via INTRAVENOUS
  Filled 2019-07-18: qty 2

## 2019-07-18 SURGICAL SUPPLY — 66 items
BASEPLATE GLENOID STD REV 42 (Joint) ×2 IMPLANT
BASEPLATE GLENOSPHERE 25 STD (Miscellaneous) ×1 IMPLANT
BASEPLATE GLENOSPHERE 25MM STD (Miscellaneous) ×1 IMPLANT
BIT DRILL 3.2 PERIPHERAL SCREW (BIT) ×2 IMPLANT
BLADE SAW SAG 73X25 THK (BLADE) ×2
BLADE SAW SGTL 73X25 THK (BLADE) ×1 IMPLANT
CHLORAPREP W/TINT 26 (MISCELLANEOUS) ×6 IMPLANT
CLOSURE STERI-STRIP 1/2X4 (GAUZE/BANDAGES/DRESSINGS) ×1
CLSR STERI-STRIP ANTIMIC 1/2X4 (GAUZE/BANDAGES/DRESSINGS) ×2 IMPLANT
COOLER ICEMAN CLASSIC (MISCELLANEOUS) ×2 IMPLANT
COVER BACK TABLE 60X90IN (DRAPES) ×2 IMPLANT
COVER SURGICAL LIGHT HANDLE (MISCELLANEOUS) ×3 IMPLANT
COVER WAND RF STERILE (DRAPES) ×3 IMPLANT
DRAPE INCISE IOBAN 66X45 STRL (DRAPES) ×3 IMPLANT
DRAPE ORTHO SPLIT 77X108 STRL (DRAPES) ×4
DRAPE SHEET LG 3/4 BI-LAMINATE (DRAPES) ×8 IMPLANT
DRAPE SURG ORHT 6 SPLT 77X108 (DRAPES) ×2 IMPLANT
DRESSING AQUACEL AG SP 3.5X6 (GAUZE/BANDAGES/DRESSINGS) IMPLANT
DRSG AQUACEL AG ADV 3.5X 6 (GAUZE/BANDAGES/DRESSINGS) ×3 IMPLANT
DRSG AQUACEL AG SP 3.5X6 (GAUZE/BANDAGES/DRESSINGS) ×3
ELECT BLADE TIP CTD 4 INCH (ELECTRODE) ×3 IMPLANT
ELECT REM PT RETURN 15FT ADLT (MISCELLANEOUS) ×3 IMPLANT
GLOVE BIO SURGEON STRL SZ7 (GLOVE) ×6 IMPLANT
GLOVE BIOGEL PI IND STRL 7.0 (GLOVE) ×1 IMPLANT
GLOVE BIOGEL PI IND STRL 8 (GLOVE) ×1 IMPLANT
GLOVE BIOGEL PI INDICATOR 7.0 (GLOVE) ×2
GLOVE BIOGEL PI INDICATOR 8 (GLOVE) ×2
GLOVE ECLIPSE 8.0 STRL XLNG CF (GLOVE) ×6 IMPLANT
GUIDEWIRE GLENOID 2.5X220 (WIRE) ×2 IMPLANT
HANDPIECE INTERPULSE COAX TIP (DISPOSABLE) ×2
HEMOSTAT SURGICEL 2X14 (HEMOSTASIS) IMPLANT
HUMERAL STEM AEQUALIS 3BX74MM (Stem) ×3 IMPLANT
IMPL REVERSE SHOULDER 0X3.5 (Shoulder) IMPLANT
IMPLANT REVERSE SHOULDER 0X3.5 (Shoulder) ×3 IMPLANT
INSERT SHLD REV 42X6 ANGLE B (Insert) ×2 IMPLANT
KIT BASIN OR (CUSTOM PROCEDURE TRAY) ×3 IMPLANT
KIT STABILIZATION SHOULDER (MISCELLANEOUS) ×3 IMPLANT
KIT TURNOVER KIT A (KITS) IMPLANT
MANIFOLD NEPTUNE II (INSTRUMENTS) ×3 IMPLANT
NDL MAYO CATGUT SZ4 TPR NDL (NEEDLE) IMPLANT
NEEDLE MAYO CATGUT SZ4 (NEEDLE) ×3 IMPLANT
NS IRRIG 1000ML POUR BTL (IV SOLUTION) ×3 IMPLANT
PACK SHOULDER (CUSTOM PROCEDURE TRAY) ×3 IMPLANT
PAD COLD SHLDR WRAP-ON (PAD) ×2 IMPLANT
PENCIL SMOKE EVACUATOR (MISCELLANEOUS) IMPLANT
RESTRAINT HEAD UNIVERSAL NS (MISCELLANEOUS) ×3 IMPLANT
SCREW CENTRAL THREAD 6.5X45 (Screw) ×2 IMPLANT
SCREW PERIPHERAL 30 (Screw) ×2 IMPLANT
SCREW PERIPHERAL 5.0X34 (Screw) ×2 IMPLANT
SET HNDPC FAN SPRY TIP SCT (DISPOSABLE) ×1 IMPLANT
SLING ULTRA III MED (ORTHOPEDIC SUPPLIES) ×3 IMPLANT
STEM HUMERAL AEQUALIS 3BX74MM (Stem) IMPLANT
SUCTION FRAZIER HANDLE 12FR (TUBING) ×2
SUCTION TUBE FRAZIER 12FR DISP (TUBING) ×1 IMPLANT
SUT ETHIBOND 2 V 37 (SUTURE) ×3 IMPLANT
SUT ETHIBOND NAB CT1 #1 30IN (SUTURE) ×3 IMPLANT
SUT FIBERWIRE #5 38 CONV NDL (SUTURE) ×12
SUT MNCRL AB 4-0 PS2 18 (SUTURE) ×3 IMPLANT
SUT VIC AB 0 CT1 36 (SUTURE) ×3 IMPLANT
SUT VIC AB 3-0 SH 27 (SUTURE) ×2
SUT VIC AB 3-0 SH 27X BRD (SUTURE) ×1 IMPLANT
SUT VIC AB 3-0 SH 8-18 (SUTURE) ×3 IMPLANT
SUTURE FIBERWR #5 38 CONV NDL (SUTURE) ×4 IMPLANT
TAPE STRIPS DRAPE STRL (GAUZE/BANDAGES/DRESSINGS) ×2 IMPLANT
TOWEL OR 17X26 10 PK STRL BLUE (TOWEL DISPOSABLE) ×3 IMPLANT
WATER STERILE IRR 1000ML POUR (IV SOLUTION) ×6 IMPLANT

## 2019-07-18 NOTE — Anesthesia Procedure Notes (Signed)
Procedure Name: Intubation Date/Time: 07/18/2019 12:43 PM Performed by: Maxwell Caul, CRNA Pre-anesthesia Checklist: Patient identified, Emergency Drugs available, Suction available and Patient being monitored Patient Re-evaluated:Patient Re-evaluated prior to induction Oxygen Delivery Method: Circle system utilized Preoxygenation: Pre-oxygenation with 100% oxygen Induction Type: IV induction Ventilation: Mask ventilation without difficulty and Oral airway inserted - appropriate to patient size Laryngoscope Size: Mac and 4 Grade View: Grade I Tube type: Oral Tube size: 7.5 mm Number of attempts: 1 Airway Equipment and Method: Stylet and Oral airway Placement Confirmation: ETT inserted through vocal cords under direct vision,  positive ETCO2 and breath sounds checked- equal and bilateral Secured at: 21 cm Tube secured with: Tape Dental Injury: Teeth and Oropharynx as per pre-operative assessment

## 2019-07-18 NOTE — Interval H&P Note (Signed)
History and Physical Interval Note:  07/18/2019 11:33 AM  Kelly Williams  has presented today for surgery, with the diagnosis of DJD RIGHT SHOULDER.  The various methods of treatment have been discussed with the patient and family. After consideration of risks, benefits and other options for treatment, the patient has consented to  Procedure(s): REVERSE SHOULDER ARTHROPLASTY (Right) as a surgical intervention.  The patient's history has been reviewed, patient examined, no change in status, stable for surgery.  I have reviewed the patient's chart and labs.  Questions were answered to the patient's satisfaction.     Hiram Gash

## 2019-07-18 NOTE — Anesthesia Procedure Notes (Signed)
Anesthesia Procedure Image    

## 2019-07-18 NOTE — Transfer of Care (Signed)
Immediate Anesthesia Transfer of Care Note  Patient: Kelly Williams  Procedure(s) Performed: REVERSE SHOULDER ARTHROPLASTY WITH SUBSCAPULAR REPAIR (Right Shoulder)  Patient Location: PACU  Anesthesia Type:General  Level of Consciousness: awake, alert  and oriented  Airway & Oxygen Therapy: Patient Spontanous Breathing and Patient connected to face mask oxygen  Post-op Assessment: Report given to RN and Post -op Vital signs reviewed and stable  Post vital signs: Reviewed and stable  Last Vitals:  Vitals Value Taken Time  BP 185/89 07/18/19 1416  Temp    Pulse 82 07/18/19 1419  Resp 22 07/18/19 1419  SpO2 100 % 07/18/19 1419  Vitals shown include unvalidated device data.  Last Pain:  Vitals:   07/18/19 0911  TempSrc:   PainSc: 5          Complications: No apparent anesthesia complications

## 2019-07-18 NOTE — Progress Notes (Signed)
Assisted Dr. Rose with right, ultrasound guided, interscalene  block. Side rails up, monitors on throughout procedure. See vital signs in flow sheet. Tolerated Procedure well. 

## 2019-07-18 NOTE — Anesthesia Postprocedure Evaluation (Signed)
Anesthesia Post Note  Patient: Kelly Williams  Procedure(s) Performed: REVERSE SHOULDER ARTHROPLASTY WITH SUBSCAPULAR REPAIR (Right Shoulder)     Patient location during evaluation: PACU Anesthesia Type: General Level of consciousness: awake and alert Pain management: pain level controlled Vital Signs Assessment: post-procedure vital signs reviewed and stable Respiratory status: spontaneous breathing, nonlabored ventilation, respiratory function stable and patient connected to nasal cannula oxygen Cardiovascular status: blood pressure returned to baseline and stable Postop Assessment: no apparent nausea or vomiting Anesthetic complications: no    Last Vitals:  Vitals:   07/18/19 1455 07/18/19 1530  BP: 123/76 119/77  Pulse: 89 85  Resp: 18 20  Temp: 36.8 C 36.8 C  SpO2: 98% 98%    Last Pain:  Vitals:   07/18/19 1530  TempSrc:   PainSc: 0-No pain                 Dreden Rivere S

## 2019-07-18 NOTE — Anesthesia Preprocedure Evaluation (Addendum)
Anesthesia Evaluation  Patient identified by MRN, date of birth, ID band Patient awake    Reviewed: Allergy & Precautions, NPO status , Patient's Chart, lab work & pertinent test results  Airway Mallampati: II  TM Distance: >3 FB Neck ROM: Full    Dental no notable dental hx.    Pulmonary COPD, former smoker,    Pulmonary exam normal breath sounds clear to auscultation       Cardiovascular hypertension, + CAD, + CABG, + Peripheral Vascular Disease and +CHF  Normal cardiovascular exam Rhythm:Regular Rate:Normal  1. Left ventricular ejection fraction, by estimation, is 40 to 45%. The left ventricle has mildly decreased function. The left ventricle demonstrates regional wall motion abnormalities (see scoring diagram/findings for description). Left ventricular  diastolic function could not be evaluated. 2. Right ventricular systolic function is normal. The right ventricular size is normal. 3. The mitral valve is normal in structure and function. Mild mitral valve regurgitation. No evidence of mitral stenosis. 4. The aortic valve is normal in structure and function. Aortic valve regurgitation is not visualized. No aortic stenosis is present.    Neuro/Psych CVA negative psych ROS   GI/Hepatic negative GI ROS, Neg liver ROS,   Endo/Other  diabetes  Renal/GU negative Renal ROS  negative genitourinary   Musculoskeletal negative musculoskeletal ROS (+)   Abdominal   Peds negative pediatric ROS (+)  Hematology negative hematology ROS (+)   Anesthesia Other Findings   Reproductive/Obstetrics negative OB ROS                            Anesthesia Physical Anesthesia Plan  ASA: III  Anesthesia Plan: General   Post-op Pain Management:  Regional for Post-op pain   Induction: Intravenous  PONV Risk Score and Plan: 2 and Ondansetron, Dexamethasone and Treatment may vary due to age or medical  condition  Airway Management Planned: Oral ETT  Additional Equipment:   Intra-op Plan:   Post-operative Plan: Extubation in OR  Informed Consent: I have reviewed the patients History and Physical, chart, labs and discussed the procedure including the risks, benefits and alternatives for the proposed anesthesia with the patient or authorized representative who has indicated his/her understanding and acceptance.     Dental advisory given  Plan Discussed with: CRNA and Surgeon  Anesthesia Plan Comments:         Anesthesia Quick Evaluation

## 2019-07-18 NOTE — Anesthesia Procedure Notes (Signed)
Anesthesia Regional Block: Interscalene brachial plexus block   Pre-Anesthetic Checklist: ,, timeout performed, Correct Patient, Correct Site, Correct Laterality, Correct Procedure, Correct Position, site marked, Risks and benefits discussed,  Surgical consent,  Pre-op evaluation,  At surgeon's request and post-op pain management  Laterality: Right  Prep: chloraprep       Needles:  Injection technique: Single-shot  Needle Type: Echogenic Needle     Needle Length: 9cm      Additional Needles:   Procedures:,,,, ultrasound used (permanent image in chart),,,,  Narrative:  Start time: 07/18/2019 10:28 AM End time: 07/18/2019 10:38 AM Injection made incrementally with aspirations every 5 mL.  Performed by: Personally  Anesthesiologist: Myrtie Soman, MD  Additional Notes: Patient tolerated the procedure well without complications

## 2019-07-18 NOTE — Op Note (Signed)
Orthopaedic Surgery Operative Note (CSN: [id number])  Kelly Williams  May 09, 1940 Date of Surgery: 07/18/2019   Diagnoses:  Right shoulder end-stage cuff tear arthropathy  Procedure: Right reverse total Shoulder Arthroplasty Removal of hardware   Operative Finding Successful completion of planned procedure.  Good fixation of the glenoid and good fixation of the humerus.  Cuff was completely torn with only a small amount of teres minor still attached posteriorly.  Removed multiple old anchors.  Axillary nerve intact in the case.  Post-operative plan: The patient will be NWB in sling.  The patient will be admitted overnight.  DVT prophylaxis not indicated in isolated upper extremity surgery patient with no specific risks factors.  Pain control with PRN pain medication preferring oral medicines.  Follow up plan will be scheduled in approximately 7 days for incision check and XR.  Physical therapy to start after first visit.  Implants: Size 4 flex stem, 42+6 poly-, high offset 0 tray, 25 standard baseplate with 45 central screw 6.5 mm and 42 glenosphere  Post-Op Diagnosis: Same Surgeons:Primary: Hiram Gash, MD Assistants:Caroline McBane PA-C Location: [facility] ROOM 06 Anesthesia: General with Exparel Interscalene Antibiotics: Ancef 2g preop, Vancomycin 1000mg  locally Tourniquet time: None Estimated Blood Loss: 417 Complications: None Specimens: None Implants: Implant Name Type Inv. Item Serial No. Manufacturer Lot No. LRB No. Used Action  BASEPLATE GLENOSPHERE 40CX STD - [id number] Miscellaneous BASEPLATE GLENOSPHERE 49FW STD  [employer] [id number] Right 1 Implanted  SCREW CENTRAL THREAD 6.5X45 - [id number] Screw SCREW CENTRAL THREAD 6.5X45  [employer]  Right 1 Implanted  BASEPLATE GLENOID STD REV 42 - [id number] Joint BASEPLATE GLENOID STD REV 42  [employer] [id number] Right 1 Implanted  SCREW BONE 6.5 OD 30 NON BIO - [id number] Screw SCREW BONE 6.5 OD 30 NON BIO  [employer]  Right 1  Implanted  HUMERAL STEM AEQUALIS 3BX74MM - [id number] Stem HUMERAL STEM AEQUALIS 3BX74MM  [employer] [id number] Right 1 Implanted  INSERT SHLD REV 42X6 ANGLE B - [id number] Insert INSERT SHLD REV 42X6 ANGLE B  [employer] [id number] Right 1 Implanted  IMPLANT REVERSE SHOULDER 0X3.5 - [id number] Shoulder IMPLANT REVERSE SHOULDER 0X3.[place] [id number] Right 1 Implanted  Tornier Screw   [id number] [employer]  Right 1 Implanted    Indications for Surgery:   Kelly Williams is a 79 y.o. male with previous cuff repairs and cuff tear arthropathy with failure of his previous cuff repairs.  Benefits and risks of operative and nonoperative management were discussed prior to surgery with patient/guardian(s) and informed consent form was completed.  Infection and need for further surgery were discussed as was prosthetic stability and cuff issues.  We additionally specifically discussed risks of axillary nerve injury, infection, periprosthetic fracture, continued pain and longevity of implants prior to beginning procedure.      Procedure:   The patient was identified in the preoperative holding area where the surgical site was marked. Block placed by anesthesia with exparel.  The patient was taken to the OR where a procedural timeout was called and the above noted anesthesia was induced.  The patient was positioned beachchair on allen table with spider arm positioner.  Preoperative antibiotics were dosed.  The patient's right shoulder was prepped and draped in the usual sterile fashion.  A second preoperative timeout was called.       Standard deltopectoral approach was performed with a #10 blade. We dissected down to the subcutaneous tissues and the cephalic vein was taken laterally with the  deltoid. Clavipectoral fascia was incised in line with the incision. Deep retractors were placed. The long of the biceps tendon was identified and there was significant tenosynovitis present.  Tenodesis was performed to  the pectoralis tendon with #2 Ethibond. The remaining biceps was followed up into the rotator interval where it was released.   The subscapularis was taken down in a full thickness layer with capsule along the humeral neck extending inferiorly around the humeral head. We continued releasing the capsule directly off of the osteophytes inferiorly all the way around the corner. This allowed [place] to dislocate the humeral head.   The humeral head had evidence of severe osteoarthritic wear with full-thickness cartilage loss and exposed subchondral bone. There was significant flattening of the humeral head.   The rotator cuff was carefully examined and noted to be irreperably torn.  The decision was confirmed that a reverse total shoulder was indicated for this patient.  There were osteophytes along the inferior humeral neck. The osteophytes were removed with an osteotome and a rongeur.  Osteophytes were removed with a rongeur and an osteotome and the anatomic neck was well visualized.     A humeral cutting guide was inserted down the intramedullary canal. The version was set at 20 of retroversion. Humeral osteotomy was performed with an oscillating saw. The head fragment was passed off the back table. A starter awl was used to open the humeral canal. We next used T-handle straight sound reamers to ream up to an appropriate fit. A chisel was used to remove proximal humeral bone. We then broached starting with a size one broach and broaching up to 4 which obtained an appropriate fit. The broach handle was removed. A cut protector was placed. The broach handle was removed and a cut protector was placed. The humerus was retracted posteriorly and we turned our attention to glenoid exposure.  The subscapularis was again identified and immediately we took care to palpate the axillary nerve anteriorly and verify its position with gentle palpation as well as the tug test.  We then released the SGHL with bovie cautery  prior to placing a curved mayo at the junction of the anterior glenoid well above the axillary nerve and bluntly dissecting the subscapularis from the capsule.  We then carefully protected the axillary nerve as we gently released the inferior capsule to fully mobilize the subscapularis.  An anterior deltoid retractor was then placed as well as a small Hohmann retractor superiorly.   The glenoid was inspected and had evidence of severe osteoarthritic wear with full-thickness cartilage loss and exposed subchondral bone. The remaining labrum was removed circumferentially taking great care not to disrupt the posterior capsule.   The glenoid drill guide was placed and used to drill a guide pin in the center, inferior position. The glenoid face was then reamed concentrically over the guide wire. The center hole was drilled over the guidepin in a near anatomic angle of version. Next the  glenoid vault was drilled back to a depth of 45 mm.  We tapped and then placed a 57mm size baseplate with 57mm lateralization was selected with a 45 mm x 6.5 mm length central screw.  The base plate was screwed into the glenoid vault obtaining secure fixation. We next placed superior and inferior locking screws for additional fixation.  Next a 42 mm glenosphere was selected and impacted onto the baseplate. The center screw was tightened.  We turned attention back to the humeral side. The cut protector was removed. We  trialed with multiple size tray and polyethylene options and selected a 6 which provided good stability and range of motion without excess soft tissue tension. The offset was dialed in to match the normal anatomy. The shoulder was trialed.  There was good ROM in all planes and the shoulder was stable with no inferior translation.  The real humeral implants were opened after again confirming sizes.  The trial was removed. #5 Fiberwire x4 sutures passed through the humeral neck for subscap repair. The humeral component  was press-fit obtaining a secure fit. A +0 high offset tray was selected and impacted onto the stem.  A 42+6 polyethylene liner was impacted onto the stem.  The joint was reduced and thoroughly irrigated with pulsatile lavage. Subscap was repaired back with #5 Fiberwire sutures through bone tunnels. Hemostasis was obtained. The deltopectoral interval was reapproximated with #1 Ethibond. The subcutaneous tissues were closed with 2-0 Vicryl and the skin was closed with running monocryl.    The wounds were cleaned and dried and an Aquacel dressing was placed. The drapes taken down. The arm was placed into sling with abduction pillow. Patient was awakened, extubated, and transferred to the recovery room in stable condition. There were no intraoperative complications. The sponge, needle, and attention counts were correct at the end of the case.        Noemi Chapel, PA-C, present and scrubbed throughout the case, critical for completion in a timely fashion, and for retraction, instrumentation, closure.

## 2019-07-19 ENCOUNTER — Encounter: Payer: Self-pay | Admitting: *Deleted

## 2019-07-24 MED FILL — oxyCODONE HCL 5 MG TABS: 5 | 5 days supply | Qty: 30 | Fill #0

## 2019-07-26 DIAGNOSIS — M19011 Primary osteoarthritis, right shoulder: Secondary | ICD-10-CM | POA: Diagnosis not present

## 2019-07-30 ENCOUNTER — Ambulatory Visit: Payer: Medicare HMO | Admitting: Rehabilitative and Restorative Service Providers"

## 2019-08-02 ENCOUNTER — Ambulatory Visit (INDEPENDENT_AMBULATORY_CARE_PROVIDER_SITE_OTHER): Payer: Medicare HMO | Admitting: Rehabilitative and Restorative Service Providers"

## 2019-08-02 ENCOUNTER — Other Ambulatory Visit: Payer: Self-pay

## 2019-08-02 DIAGNOSIS — M6281 Muscle weakness (generalized): Secondary | ICD-10-CM

## 2019-08-02 DIAGNOSIS — R293 Abnormal posture: Secondary | ICD-10-CM | POA: Diagnosis not present

## 2019-08-02 DIAGNOSIS — M25511 Pain in right shoulder: Secondary | ICD-10-CM | POA: Diagnosis not present

## 2019-08-02 DIAGNOSIS — R600 Localized edema: Secondary | ICD-10-CM | POA: Diagnosis not present

## 2019-08-02 NOTE — Patient Instructions (Signed)
Access Code: [email or web address] URL: [email or web address] Date: 08/02/2019 Prepared by: Rudell Cobb  Exercises Seated Shoulder Pendulum Exercise - 2 x daily - 7 x weekly - 10 reps - 1 sets Seated Scapular Retraction - 2 x daily - 7 x weekly - 1 sets - 3 reps - 3 seconds hold Elbow AROM Flexion & Extension Neutral Forearm - 2 x daily - 7 x weekly - 1 sets - 5 reps

## 2019-08-02 NOTE — Therapy (Signed)
[place], [place], [address] Phone: [phone]   Fax:  [phone]  Physical Therapy Evaluation  Patient Details  Name: HUMPHREY GUERREIRO MRN: [id number] Date of Birth: [birth year] Referring Provider (PT): Ophelia Charter, MD   Encounter Date: 08/02/2019  PT End of Session - 08/02/19 1743    Visit Number  1    Number of Visits  24    Date for PT Re-Evaluation  10/25/19    Authorization Type  [place] medicare    PT Start Time  1708    PT Stop Time  1755    PT Time Calculation (min)  47 min    Activity Tolerance  Patient tolerated treatment well    Behavior During Therapy  [facility] for tasks assessed/performed       Past Medical History:  Diagnosis Date  . Alcohol abuse   . Alcohol abuse   . Arthritis   . Carotid arterial disease ([place])   . Chronic kidney disease    CKD-patient denies  . COPD (chronic obstructive pulmonary disease) (HCC)    uses inhalers  . Coronary artery disease   . Diabetes mellitus without complication ([place])   . Diabetic retinopathy ([place])   . Hyperlipidemia   . Hypertension   . Normocytic anemia 01/16/2019  . Stroke [facility])    around 2020    Past Surgical History:  Procedure Laterality Date  . APPENDECTOMY     as teenager  . BACK SURGERY    . CAROTID ENDARTERECTOMY Right   . CORONARY ARTERY BYPASS GRAFT     10-12 years ago in [place] at [place] Right 07/18/2019   Procedure: REVERSE SHOULDER ARTHROPLASTY WITH SUBSCAPULAR REPAIR;  Surgeon: Hiram Gash, MD;  Location: [facility];  Service: Orthopedics;  Laterality: Right;  [name] VASECTOMY      There were no vitals filed for this visit.   Subjective Assessment - 08/02/19 1712    Subjective  The patient is s/p R reverse total shoulder arthroplasty on 07/18/19.  He notes 2 prior R shoulder surgeries and reported pre surgery he was not able to lift it unless he assisted with his left hand.    Pertinent History  h/o RTC repair, diabetic neuropathy,  CAD, COPD, HTN, DM type 2, diabetic retinopathy, stroke in 2020, and CABG x 5    Patient Stated Goals  Hope to be able to brush my teeth and comb my hair    Currently in Pain?  No/denies   none at rest        [facility] PT Assessment - 08/02/19 1717      Assessment   Medical Diagnosis  R reverse total shoulder repair    Referring Provider (PT)  Ophelia Charter, MD    Onset Date/Surgical Date  07/18/19    Hand Dominance  Right    Prior Therapy  known to our clinic from prior therapy      Precautions   Precautions  Shoulder    Type of Shoulder Precautions  no IROM, ERON in scapular plane , no lifting above 90 deg (see protocol as we progress)      Balance Screen   Has the patient fallen in the past 6 months  Yes    How many times?  6    Has the patient had a decrease in activity level because of a fear of falling?   Yes    Is the patient reluctant  to leave their home because of a fear of falling?   No      Home Film/video editor residence    [place]   lives with daughter   Available Help at Discharge  Family    Type of [place] to enter    Entrance Stairs-Number of Steps  13    Entrance Stairs-Rails  --   can reach rail with left hand   Home Layout  One level    [place] - single point      Prior Function   Level of [place] with household mobility with device   dresses and bathes mod indep; daughter cleaning   Vocation  Retired      Observation/Other Assessments   Focus on Therapeutic Outcomes (FOTO)   66% limited      Sensation   Light Touch  Appears Intact      Posture/Postural Control   Posture/Postural Control  Postural limitations    Postural Limitations  Rounded Shoulders;Forward head      ROM / Strength   AROM / PROM / Strength  PROM;AROM      AROM   Overall AROM Comments  L arm WNLs; R UE unable to  perform AROM at this tiime per protocol    AROM Assessment Site  Elbow    Right/Left Elbow  Right    Right Elbow Extension  -8      PROM   Overall PROM   Deficits    Overall PROM Comments  In supine; reviewed keeping elbow in view when in supine for positioning    PROM Assessment Site  Shoulder    Right/Left Shoulder  Right;Left    Right Shoulder Flexion  90 Degrees   supine   Right Shoulder ABduction  70 Degrees   supine   Right Shoulder External Rotation  20 Degrees   in scaption; elbow on pillow               Objective measurements completed on examination: See above findings.      [facility] Adult PT Treatment/Exercise - 08/02/19 1717      Exercises   Exercises  Shoulder      Shoulder Exercises: Supine   Other Supine Exercises  elbow flexion/extension supine AROM      Shoulder Exercises: Seated   Other Seated Exercises  gentle scapular retraction x 2 reps    Other Seated Exercises  seated pendulum due to h/o falls *leaned forward in chair with gentle lateral rocking      Modalities   Modalities  Vasopneumatic      Vasopneumatic   Number Minutes Vasopneumatic   10 minutes    Vasopnuematic Location   Shoulder    Vasopneumatic Pressure  Low    Vasopneumatic Temperature   34             PT Education - 08/02/19 1815    Education Details  reviewed restrictions of using sling except when exercising (PT activities only), sling to sleep, use of ice at home, no lifting or active motion R shoulder;  HEP initiated    Person(s) Educated  Patient    Methods  Explanation;Demonstration;Handout    Comprehension  Returned demonstration;Verbalized understanding       PT Short Term Goals - 08/02/19 1817      PT SHORT TERM GOAL #1   Title  The patient will be indep with initial HEP (per protocol).    Time  6    Period  Weeks    Target Date  09/13/19      PT SHORT TERM GOAL #2   Title  The patient will improve PROM to 120 degrees for flexion and scaption.     Baseline  90 deg flexion    Time  6    Period  Weeks    Target Date  09/13/19      PT SHORT TERM GOAL #3   Title  The patient will improve PROM ER to 40 degrees    Baseline  20 deg    Time  6    Period  Weeks    Target Date  09/13/19      PT SHORT TERM GOAL #4   Title  The patient will tolerate isometric deltoid and periscapular AROM.    Time  6    Period  Weeks    Target Date  09/13/19        PT Long Term Goals - 08/02/19 1821      PT LONG TERM GOAL #1   Title  The patient will be indep with progession of HEP    Time  12    Period  Weeks    Target Date  10/25/19      PT LONG TERM GOAL #2   Title  The patient will improve AROM R shoulder to 45 degrees of flexion and scaption in sitting.    Baseline  Unable at eval due to protocol    Time  12    Period  Weeks    Target Date  10/25/19      PT LONG TERM GOAL #3   Title  The patient will be able to reach to the L shoulder for bathing/ADLs.    Time  12    Period  Weeks    Target Date  10/25/19      PT LONG TERM GOAL #4   Title  The patient will be able to reach to R ear and face for shaving and hygeine.    Time  12    Period  Weeks    Target Date  10/25/19      PT LONG TERM GOAL #5   Title  The patient will be able to reach to R side back pocket to demonstrate improved function R shoulder.    Time  12    Period  Weeks    Target Date  10/25/19             Plan - 08/02/19 1824    Clinical Impression Statement  The patient is a 79 yo male presenting to OP rehab s/p R reverse total shoulder replacement.  He did not show for first 2 appointments this week and was able to work in at end of day today for evaluation.  He has not been able to perform home cryotherapy noting he does not like cold and is unsure how to use the machine.  He has impairments of limited PROM, no AROM (due to protocol), tightness in R shoulder, abnormal posture, muscle weakness, and localized edema.  This leads to dec'd functional use of the  R shoulder for all ADLs.  PT to address deficits to promote improved use of R UE for daily activities.    Personal Factors and Comorbidities  Comorbidity 1;Comorbidity 3+;Comorbidity 2    Comorbidities  diabetes, HTN, COPD, falls  Examination-Activity Limitations  Bed Mobility;Locomotion Level;Lift;Carry;Reach Overhead    Examination-Participation Restrictions  Meal Prep   patient limited in shaving, ADLs   Stability/Clinical Decision Making  Stable/Uncomplicated    Clinical Decision Making  Moderate    Rehab Potential  Fair   multiple comorbidities   PT Frequency  2x / week    PT Duration  12 weeks    PT Treatment/Interventions  ADLs/Self Care Home Management;Electrical Stimulation;Moist Heat;Cryotherapy;Patient/family education;Taping;Dry needling;Manual techniques;Therapeutic exercise;Therapeutic activities;Passive range of motion    PT Next Visit Plan  per protocol focusing on PROM, sub maximal deltoid isometrics, and scapular stabilization    PT Home Exercise Plan  [facility]       Patient will benefit from skilled therapeutic intervention in order to improve the following deficits and impairments:  Decreased range of motion, Pain, Hypomobility, Postural dysfunction, Decreased strength, Increased edema, Impaired tone, Impaired flexibility  Visit Diagnosis: Acute pain of right shoulder  Muscle weakness (generalized)  Localized edema  Abnormal posture     Problem List Patient Active Problem List   Diagnosis Date Noted  . History of CVA (cerebrovascular accident) 06/25/2019  . Bruit 06/25/2019  . Dizziness 06/14/2019  . Essential hypertension 06/14/2019  . Injury of left shoulder 05/30/2019  . Rotator cuff tear, right 05/30/2019  . Loose stools 02/04/2019  . Gait disturbance 02/04/2019  . Cervical spondylosis 01/17/2019  . Normocytic anemia 01/16/2019  . Trigger finger, right ring finger 10/27/2018  . Renal insufficiency 06/29/2018  . At moderate risk for fall  06/29/2018  . Ambulates with cane 06/29/2018  . Memory difficulties 06/02/2018  . Diabetic autonomic neuropathy associated with type 2 diabetes mellitus ([place]) 06/02/2018  . Status post lumbar spinal fusion 06/02/2018  . S/P CABG x 5 06/02/2018  . Chronic obstructive pulmonary disease ([place]) 06/02/2018  . Shortness of breath 06/02/2018  . Diabetic nephropathy associated with type 2 diabetes mellitus ([place]) 06/02/2018  . Type 2 diabetes mellitus with diabetic neuropathy, unspecified ([place]) 06/02/2018  . Tachycardia with heart rate 100-120 beats per minute 06/02/2018  . Acromioclavicular arthrosis 06/02/2018  . Diabetic retinopathy ([place])   . Coronary artery disease   . Carotid arterial disease ([place])     Becker , PT 08/02/2019, 6:31 PM  [facility] [place], [place], [address] Phone: [phone]   Fax:  [phone]  Name: SHELVY PERAZZO MRN: [id number] Date of Birth: 07/25/1940

## 2019-08-07 ENCOUNTER — Ambulatory Visit: Payer: Medicare HMO | Admitting: Nurse Practitioner

## 2019-08-07 ENCOUNTER — Other Ambulatory Visit: Payer: Self-pay

## 2019-08-07 ENCOUNTER — Telehealth: Payer: Self-pay

## 2019-08-07 NOTE — Telephone Encounter (Signed)
Patient requesting medication provided by surgeon. Discussed with the patient that he will need to contact the surgeons office for refills of pain medication as a result of his surgery. He was provided with the physician name and phone number. He voiced understanding.

## 2019-08-07 NOTE — Telephone Encounter (Signed)
Pt stopped by the office this afternoon requesting med refill for hydrocodone-ace. Pt has f/u  appt this afternoon with Emeterio Reeve. Pls send to [place]. Thanks.

## 2019-08-07 NOTE — Telephone Encounter (Signed)
To be seen this afternoon.

## 2019-08-08 ENCOUNTER — Ambulatory Visit (INDEPENDENT_AMBULATORY_CARE_PROVIDER_SITE_OTHER): Payer: Medicare HMO | Admitting: Physical Therapy

## 2019-08-08 ENCOUNTER — Ambulatory Visit: Payer: Medicare HMO | Admitting: Rehabilitative and Restorative Service Providers"

## 2019-08-08 ENCOUNTER — Encounter: Payer: Self-pay | Admitting: Physical Therapy

## 2019-08-08 DIAGNOSIS — R293 Abnormal posture: Secondary | ICD-10-CM

## 2019-08-08 DIAGNOSIS — M25511 Pain in right shoulder: Secondary | ICD-10-CM

## 2019-08-08 DIAGNOSIS — R600 Localized edema: Secondary | ICD-10-CM | POA: Diagnosis not present

## 2019-08-08 DIAGNOSIS — M6281 Muscle weakness (generalized): Secondary | ICD-10-CM | POA: Diagnosis not present

## 2019-08-08 NOTE — Therapy (Signed)
[place], [place], [address] Phone: [phone]   Fax:  [phone]  Physical Therapy Treatment  Patient Details  Name: Kelly Williams MRN: [id number] Date of Birth: December 02, 1940 Referring Provider (PT): Ophelia Charter, MD   Encounter Date: 08/08/2019  PT End of Session - 08/08/19 1413    Visit Number  2    Number of Visits  24    Date for PT Re-Evaluation  10/25/19    Authorization Type  [employer] medicare    PT Start Time  5643   pt arrived late   PT Stop Time  1436    PT Time Calculation (min)  31 min    Activity Tolerance  Patient tolerated treatment well    Behavior During Therapy  [facility] for tasks assessed/performed       Past Medical History:  Diagnosis Date  . Alcohol abuse   . Alcohol abuse   . Arthritis   . Carotid arterial disease ([place])   . Chronic kidney disease    CKD-patient denies  . COPD (chronic obstructive pulmonary disease) (HCC)    uses inhalers  . Coronary artery disease   . Diabetes mellitus without complication ([place])   . Diabetic retinopathy ([place])   . Hyperlipidemia   . Hypertension   . Normocytic anemia 01/16/2019  . Stroke [facility])    around 2020    Past Surgical History:  Procedure Laterality Date  . APPENDECTOMY     as teenager  . BACK SURGERY    . CAROTID ENDARTERECTOMY Right   . CORONARY ARTERY BYPASS GRAFT     10-12 years ago in [place] at [place] Right 07/18/2019   Procedure: REVERSE SHOULDER ARTHROPLASTY WITH SUBSCAPULAR REPAIR;  Surgeon: Hiram Gash, MD;  Location: [facility];  Service: Orthopedics;  Laterality: Right;  [name] VASECTOMY      There were no vitals filed for this visit.  Subjective Assessment - 08/08/19 1414    Subjective  Pt reports he usually wears his sling for a few hours then off for a 1/2 hr or so, as well as to shave his face with his Rt hand.  He is doing his exercises daily.    Pertinent History   h/o RTC repair, diabetic neuropathy,  CAD, COPD, HTN, DM type 2, diabetic retinopathy, stroke in 2020, and CABG x 5    Patient Stated Goals  Hope to be able to brush my teeth and comb my hair    Currently in Pain?  No/denies    Pain Score  0-No pain         [facility] PT Assessment - 08/08/19 0001      Assessment   Medical Diagnosis  R reverse total shoulder repair    Referring Provider (PT)  Ophelia Charter, MD    Onset Date/Surgical Date  07/18/19    Hand Dominance  Right    Prior Therapy  known to our clinic from prior therapy       [facility] Adult PT Treatment/Exercise - 08/08/19 0001      Shoulder Exercises: Seated   Other Seated Exercises  gentle scapular retraction x 5 reps, x 5 sec hold (cues to breathe)     Other Seated Exercises  seated pendulum due to h/o falls *leaned forward in chair with gentle lateral rocking; Rt elbow flexion/ext AROM x 10; forearm supination/pronation x 10      Shoulder Exercises: Isometric Strengthening   Flexion  3X3"   submax; not tolerated.      Vasopneumatic   Number Minutes Vasopneumatic   10 minutes    Vasopnuematic Location   Shoulder    Vasopneumatic Pressure  Low    Vasopneumatic Temperature   34      Manual Therapy   Manual Therapy  Passive ROM    Manual therapy comments  pt in supported supine    Passive ROM  Rt shoulder flexion, scaption (to ~120 deg) , ER in scapular plane x 10 reps each to tissue limits and no pain.                PT Short Term Goals - 08/02/19 1817      PT SHORT TERM GOAL #1   Title  The patient will be indep with initial HEP (per protocol).    Time  6    Period  Weeks    Target Date  09/13/19      PT SHORT TERM GOAL #2   Title  The patient will improve PROM to 120 degrees for flexion and scaption.    Baseline  90 deg flexion    Time  6    Period  Weeks    Target Date  09/13/19      PT SHORT TERM GOAL #3   Title  The patient will improve PROM ER to 40 degrees    Baseline  20 deg    Time  6     Period  Weeks    Target Date  09/13/19      PT SHORT TERM GOAL #4   Title  The patient will tolerate isometric deltoid and periscapular AROM.    Time  6    Period  Weeks    Target Date  09/13/19        PT Long Term Goals - 08/02/19 1821      PT LONG TERM GOAL #1   Title  The patient will be indep with progession of HEP    Time  12    Period  Weeks    Target Date  10/25/19      PT LONG TERM GOAL #2   Title  The patient will improve AROM R shoulder to 45 degrees of flexion and scaption in sitting.    Baseline  Unable at eval due to protocol    Time  12    Period  Weeks    Target Date  10/25/19      PT LONG TERM GOAL #3   Title  The patient will be able to reach to the L shoulder for bathing/ADLs.    Time  12    Period  Weeks    Target Date  10/25/19      PT LONG TERM GOAL #4   Title  The patient will be able to reach to R ear and face for shaving and hygeine.    Time  12    Period  Weeks    Target Date  10/25/19      PT LONG TERM GOAL #5   Title  The patient will be able to reach to R side back pocket to demonstrate improved function R shoulder.    Time  12    Period  Weeks    Target Date  10/25/19            Plan - 08/08/19 1427    Clinical Impression Statement  Pt observed using Rt arm to adjust belt and  phone on belt (active Rt shoulder ext); pt reminded of rehab precautions.  Pt was somewhat guarded with PROM, requiring some cues. Isometrics  for shoulder flexion not tolerated due to increased pain despite submaximal contraction; will trial next visit.  Goals are ongoing.    Personal Factors and Comorbidities  Comorbidity 1;Comorbidity 3+;Comorbidity 2    Comorbidities  diabetes, HTN, COPD, falls    Examination-Activity Limitations  Bed Mobility;Locomotion Level;Lift;Carry;Reach Overhead    Examination-Participation Restrictions  Meal Prep   patient limited in shaving, ADLs   Stability/Clinical Decision Making  Stable/Uncomplicated    Rehab Potential   Fair   multiple comorbidities   PT Frequency  2x / week    PT Duration  12 weeks    PT Treatment/Interventions  ADLs/Self Care Home Management;Electrical Stimulation;Moist Heat;Cryotherapy;Patient/family education;Taping;Dry needling;Manual techniques;Therapeutic exercise;Therapeutic activities;Passive range of motion    PT Next Visit Plan  per protocol focusing on PROM, sub maximal deltoid isometrics, and scapular stabilization    PT Home Exercise Plan  [facility]       Patient will benefit from skilled therapeutic intervention in order to improve the following deficits and impairments:  Decreased range of motion, Pain, Hypomobility, Postural dysfunction, Decreased strength, Increased edema, Impaired tone, Impaired flexibility  Visit Diagnosis: Acute pain of right shoulder  Muscle weakness (generalized)  Localized edema  Abnormal posture     Problem List Patient Active Problem List   Diagnosis Date Noted  . History of CVA (cerebrovascular accident) 06/25/2019  . Bruit 06/25/2019  . Dizziness 06/14/2019  . Essential hypertension 06/14/2019  . Injury of left shoulder 05/30/2019  . Rotator cuff tear, right 05/30/2019  . Loose stools 02/04/2019  . Gait disturbance 02/04/2019  . Cervical spondylosis 01/17/2019  . Normocytic anemia 01/16/2019  . Trigger finger, right ring finger 10/27/2018  . Renal insufficiency 06/29/2018  . At moderate risk for fall 06/29/2018  . Ambulates with cane 06/29/2018  . Memory difficulties 06/02/2018  . Diabetic autonomic neuropathy associated with type 2 diabetes mellitus ([place]) 06/02/2018  . Status post lumbar spinal fusion 06/02/2018  . S/P CABG x 5 06/02/2018  . Chronic obstructive pulmonary disease ([place]) 06/02/2018  . Shortness of breath 06/02/2018  . Diabetic nephropathy associated with type 2 diabetes mellitus ([place]) 06/02/2018  . Type 2 diabetes mellitus with diabetic neuropathy, unspecified ([place]) 06/02/2018  . Tachycardia with heart rate  100-120 beats per minute 06/02/2018  . Acromioclavicular arthrosis 06/02/2018  . Diabetic retinopathy ([place])   . Coronary artery disease   . Carotid arterial disease [facility])    Kerin Perna, PTA 08/08/19 2:33 PM  [place], [place], [address] Phone: [phone]   Fax:  [phone]  Name: Kelly Williams MRN: [id number] Date of Birth: May 20, 1940

## 2019-08-09 DIAGNOSIS — M19011 Primary osteoarthritis, right shoulder: Secondary | ICD-10-CM | POA: Diagnosis not present

## 2019-08-10 ENCOUNTER — Ambulatory Visit (INDEPENDENT_AMBULATORY_CARE_PROVIDER_SITE_OTHER): Payer: Medicare HMO | Admitting: Physical Therapy

## 2019-08-10 ENCOUNTER — Other Ambulatory Visit: Payer: Self-pay

## 2019-08-10 DIAGNOSIS — R293 Abnormal posture: Secondary | ICD-10-CM

## 2019-08-10 DIAGNOSIS — R600 Localized edema: Secondary | ICD-10-CM | POA: Diagnosis not present

## 2019-08-10 DIAGNOSIS — M6281 Muscle weakness (generalized): Secondary | ICD-10-CM

## 2019-08-10 DIAGNOSIS — M25511 Pain in right shoulder: Secondary | ICD-10-CM

## 2019-08-10 NOTE — Therapy (Signed)
[facility], [place], [address] Phone: [phone]   Fax:  [phone]  Physical Therapy Treatment  Patient Details  Name: Kelly Williams MRN: [id number] Date of Birth: 31-Mar-1941 Referring Provider (PT): Ophelia Charter, MD   Encounter Date: 08/10/2019  PT End of Session - 08/10/19 1345    Visit Number  3    Number of Visits  24    Date for PT Re-Evaluation  10/25/19    Authorization Type  [employer] medicare    PT Start Time  1318    PT Stop Time  1359    PT Time Calculation (min)  41 min    Behavior During Therapy  [facility] for tasks assessed/performed       Past Medical History:  Diagnosis Date  . Alcohol abuse   . Alcohol abuse   . Arthritis   . Carotid arterial disease ([place])   . Chronic kidney disease    CKD-patient denies  . COPD (chronic obstructive pulmonary disease) (HCC)    uses inhalers  . Coronary artery disease   . Diabetes mellitus without complication ([place])   . Diabetic retinopathy ([place])   . Hyperlipidemia   . Hypertension   . Normocytic anemia 01/16/2019  . Stroke [facility])    around 2020    Past Surgical History:  Procedure Laterality Date  . APPENDECTOMY     as teenager  . BACK SURGERY    . CAROTID ENDARTERECTOMY Right   . CORONARY ARTERY BYPASS GRAFT     10-12 years ago in [place] at [place] Right 07/18/2019   Procedure: REVERSE SHOULDER ARTHROPLASTY WITH SUBSCAPULAR REPAIR;  Surgeon: Hiram Gash, MD;  Location: [facility];  Service: Orthopedics;  Laterality: Right;  [name] VASECTOMY      There were no vitals filed for this visit.  Subjective Assessment - 08/10/19 1316    Subjective  "I was sore after the last visit. The doctor said I was getting too rambunctious with exercises".    Pertinent History  h/o RTC repair, diabetic neuropathy,  CAD, COPD, HTN, DM type 2, diabetic retinopathy, stroke in 2020, and CABG x 5    Patient Stated Goals   Hope to be able to brush my teeth and comb my hair    Currently in Pain?  Yes    Pain Score  4     Pain Location  Shoulder    Pain Orientation  Right;Posterior    Pain Descriptors / Indicators  Aching;Sore    Aggravating Factors   moving arm    Pain Relieving Factors  resting         [facility] PT Assessment - 08/10/19 0001      Assessment   Medical Diagnosis  R reverse total shoulder repair    Referring Provider (PT)  Ophelia Charter, MD    Onset Date/Surgical Date  07/18/19    Hand Dominance  Right    Prior Therapy  known to our clinic from prior therapy                   [facility] Adult PT Treatment/Exercise - 08/10/19 0001      Elbow Exercises   Elbow Flexion  Both;10 reps;AROM    Forearm Supination  AROM;Both;10 reps    Forearm Pronation  AROM;Both;10 reps      Neck Exercises: Seated   Cervical Rotation  Right;Left;5 reps    Lateral Flexion  Right;Left;5 reps  5-10 sec each side.      Shoulder Exercises: Seated   Retraction  Both;5 reps;AROM x 2 sets, tactile cues for upright posture   Other Seated Exercises  shoulder rolls backward x 5 reps, 2 sets      Shoulder Exercises: Isometric Strengthening   Flexion  --   10 sec x 3 reps; 5 sec x 5 reps   External Rotation  --   5 sec x 10 reps     Wrist Exercises   Wrist Flexion  Right;Strengthening;10 reps    Bar Weights/Barbell (Wrist Flexion)  1 lb    Wrist Extension  Right;Strengthening;10 reps    Bar Weights/Barbell (Wrist Extension)  1 lb      Vasopneumatic   Number Minutes Vasopneumatic   10 minutes    Vasopnuematic Location   Shoulder    Vasopneumatic Pressure  Low    Vasopneumatic Temperature   34      Manual Therapy   Manual Therapy  Passive ROM    Manual therapy comments  pt in supported supine    Passive ROM  Rt shoulder flexion, scaption (to ~120 deg) , ER in scapular plane x 10 reps each to tissue limits and no pain.                PT Short Term Goals - 08/02/19 1817      PT SHORT  TERM GOAL #1   Title  The patient will be indep with initial HEP (per protocol).    Time  6    Period  Weeks    Target Date  09/13/19      PT SHORT TERM GOAL #2   Title  The patient will improve PROM to 120 degrees for flexion and scaption.    Baseline  90 deg flexion    Time  6    Period  Weeks    Target Date  09/13/19      PT SHORT TERM GOAL #3   Title  The patient will improve PROM ER to 40 degrees    Baseline  20 deg    Time  6    Period  Weeks    Target Date  09/13/19      PT SHORT TERM GOAL #4   Title  The patient will tolerate isometric deltoid and periscapular AROM.    Time  6    Period  Weeks    Target Date  09/13/19        PT Long Term Goals - 08/02/19 1821      PT LONG TERM GOAL #1   Title  The patient will be indep with progession of HEP    Time  12    Period  Weeks    Target Date  10/25/19      PT LONG TERM GOAL #2   Title  The patient will improve AROM R shoulder to 45 degrees of flexion and scaption in sitting.    Baseline  Unable at eval due to protocol    Time  12    Period  Weeks    Target Date  10/25/19      PT LONG TERM GOAL #3   Title  The patient will be able to reach to the L shoulder for bathing/ADLs.    Time  12    Period  Weeks    Target Date  10/25/19      PT LONG TERM GOAL #4   Title  The patient will  be able to reach to R ear and face for shaving and hygeine.    Time  12    Period  Weeks    Target Date  10/25/19      PT LONG TERM GOAL #5   Title  The patient will be able to reach to R side back pocket to demonstrate improved function R shoulder.    Time  12    Period  Weeks    Target Date  10/25/19            Plan - 08/10/19 1344    Clinical Impression Statement  Pt had difficulty tolerating scap squeezes at beginning of session due to increased pain in posterior Rt shoulder; improved tolerance after PROM in supine.  Pt encouraged to have Rt shoulder remain passive in sling during this healing phase. Pt reported  reduction of Rt shoulder pain after vaso.  Goals are going.    Personal Factors and Comorbidities  Comorbidity 1;Comorbidity 3+;Comorbidity 2    Comorbidities  diabetes, HTN, COPD, falls    Examination-Activity Limitations  Bed Mobility;Locomotion Level;Lift;Carry;Reach Overhead    Examination-Participation Restrictions  Meal Prep   patient limited in shaving, ADLs   Stability/Clinical Decision Making  Stable/Uncomplicated    Rehab Potential  Fair   multiple comorbidities   PT Frequency  2x / week    PT Duration  12 weeks    PT Treatment/Interventions  ADLs/Self Care Home Management;Electrical Stimulation;Moist Heat;Cryotherapy;Patient/family education;Taping;Dry needling;Manual techniques;Therapeutic exercise;Therapeutic activities;Passive range of motion    PT Next Visit Plan  per protocol focusing on PROM, sub maximal deltoid isometrics, and scapular stabilization.    PT Home Exercise Plan  [facility]       Patient will benefit from skilled therapeutic intervention in order to improve the following deficits and impairments:  Decreased range of motion, Pain, Hypomobility, Postural dysfunction, Decreased strength, Increased edema, Impaired tone, Impaired flexibility  Visit Diagnosis: Acute pain of right shoulder  Muscle weakness (generalized)  Localized edema  Abnormal posture     Problem List Patient Active Problem List   Diagnosis Date Noted  . History of CVA (cerebrovascular accident) 06/25/2019  . Bruit 06/25/2019  . Dizziness 06/14/2019  . Essential hypertension 06/14/2019  . Injury of left shoulder 05/30/2019  . Rotator cuff tear, right 05/30/2019  . Loose stools 02/04/2019  . Gait disturbance 02/04/2019  . Cervical spondylosis 01/17/2019  . Normocytic anemia 01/16/2019  . Trigger finger, right ring finger 10/27/2018  . Renal insufficiency 06/29/2018  . At moderate risk for fall 06/29/2018  . Ambulates with cane 06/29/2018  . Memory difficulties 06/02/2018  .  Diabetic autonomic neuropathy associated with type 2 diabetes mellitus ([place]) 06/02/2018  . Status post lumbar spinal fusion 06/02/2018  . S/P CABG x 5 06/02/2018  . Chronic obstructive pulmonary disease ([place]) 06/02/2018  . Shortness of breath 06/02/2018  . Diabetic nephropathy associated with type 2 diabetes mellitus ([place]) 06/02/2018  . Type 2 diabetes mellitus with diabetic neuropathy, unspecified ([place]) 06/02/2018  . Tachycardia with heart rate 100-120 beats per minute 06/02/2018  . Acromioclavicular arthrosis 06/02/2018  . Diabetic retinopathy ([place])   . Coronary artery disease   . Carotid arterial disease [facility])     Kerin Perna, PTA 08/10/19 2:16 PM  [facility] [place], [place], [address] Phone: [phone]   Fax:  [phone]  Name: DAELON DUNIVAN MRN: [id number] Date of Birth: 1940-07-28

## 2019-08-15 ENCOUNTER — Encounter: Payer: Self-pay | Admitting: Rehabilitative and Restorative Service Providers"

## 2019-08-15 ENCOUNTER — Other Ambulatory Visit: Payer: Self-pay

## 2019-08-15 ENCOUNTER — Ambulatory Visit (INDEPENDENT_AMBULATORY_CARE_PROVIDER_SITE_OTHER): Payer: Medicare HMO | Admitting: Rehabilitative and Restorative Service Providers"

## 2019-08-15 DIAGNOSIS — M25511 Pain in right shoulder: Secondary | ICD-10-CM

## 2019-08-15 DIAGNOSIS — M6281 Muscle weakness (generalized): Secondary | ICD-10-CM | POA: Diagnosis not present

## 2019-08-15 DIAGNOSIS — R293 Abnormal posture: Secondary | ICD-10-CM

## 2019-08-15 DIAGNOSIS — R600 Localized edema: Secondary | ICD-10-CM | POA: Diagnosis not present

## 2019-08-15 NOTE — Therapy (Signed)
[place], [place], [address] Phone: [phone]   Fax:  [phone]  Physical Therapy Treatment  Patient Details  Name: Kelly Williams MRN: [id number] Date of Birth: Nov 07, 1940 Referring Provider (PT): Ophelia Charter, MD   Encounter Date: 08/15/2019  PT End of Session - 08/15/19 1103    Visit Number  4    Number of Visits  24    Date for PT Re-Evaluation  10/25/19    PT Start Time  1102    PT Stop Time  1143    PT Time Calculation (min)  41 min    Activity Tolerance  Patient tolerated treatment well       Past Medical History:  Diagnosis Date  . Alcohol abuse   . Alcohol abuse   . Arthritis   . Carotid arterial disease ([place])   . Chronic kidney disease    CKD-patient denies  . COPD (chronic obstructive pulmonary disease) (HCC)    uses inhalers  . Coronary artery disease   . Diabetes mellitus without complication ([place])   . Diabetic retinopathy ([place])   . Hyperlipidemia   . Hypertension   . Normocytic anemia 01/16/2019  . Stroke [facility])    around 2020    Past Surgical History:  Procedure Laterality Date  . APPENDECTOMY     as teenager  . BACK SURGERY    . CAROTID ENDARTERECTOMY Right   . CORONARY ARTERY BYPASS GRAFT     10-12 years ago in [place] at [place] Right 07/18/2019   Procedure: REVERSE SHOULDER ARTHROPLASTY WITH SUBSCAPULAR REPAIR;  Surgeon: Hiram Gash, MD;  Location: [facility];  Service: Orthopedics;  Laterality: Right;  [name] VASECTOMY      There were no vitals filed for this visit.  Subjective Assessment - 08/15/19 1103    Subjective  No pain in the shoulder - working on his exercises at home but not as much as he was.    Currently in Pain?  Yes    Pain Score  3     Pain Location  Shoulder    Pain Orientation  Right;Posterior;Anterior    Pain Descriptors / Indicators  Aching;Sore    Pain Type  Chronic pain    Pain Onset  More than  a month ago    Pain Frequency  Intermittent    Aggravating Factors   moving arm    Pain Relieving Factors  resting                       [facility] Adult PT Treatment/Exercise - 08/15/19 0001      Elbow Exercises   Elbow Flexion  AROM;Right;10 reps;Seated    Forearm Supination  AROM;Both;10 reps    Forearm Pronation  AROM;Both;10 reps      Neck Exercises: Seated   Neck Retraction  5 reps;10 secs    Cervical Rotation  Right;Left;5 reps    Lateral Flexion  Right;Left;5 reps   5-10 sec each side.      Shoulder Exercises: Supine   Other Supine Exercises  chest lift 10 sec x 10 reps x 2 sets       Shoulder Exercises: Seated   Retraction  AROM;Both;10 reps    Other Seated Exercises  shoulder rolls backward x 5 reps, 2 sets      Shoulder Exercises: ROM/Strengthening   Pendulum  x10 sitting       Wrist Exercises  Wrist Flexion  AROM;Right;10 reps;Supine    Wrist Extension  AROM;Right;10 reps;Supine    Other wrist exercises  supination/pronation supine ebow etended x 10 reps       Vasopneumatic   Number Minutes Vasopneumatic   10 minutes    Vasopnuematic Location   Shoulder    Vasopneumatic Pressure  Low    Vasopneumatic Temperature   34      Manual Therapy   Manual therapy comments  pt supine hooklying     Soft tissue mobilization  STM Rt shoulder girdle     Myofascial Release  anterior deltoid/biceps    Scapular Mobilization  Rt     Passive ROM  Rt shoulder flexion, scaption (to ~120 deg) , ER in scapular plane to tissue limits and no pain.              PT Education - 08/15/19 1134    Education Details  reviewed post op precautions - wearing sling; ice for home; no lifting; no active motion of Rt shoulder    Person(s) Educated  Patient    Methods  Explanation    Comprehension  Verbalized understanding       PT Short Term Goals - 08/02/19 1817      PT SHORT TERM GOAL #1   Title  The patient will be indep with initial HEP (per protocol).    Time   6    Period  Weeks    Target Date  09/13/19      PT SHORT TERM GOAL #2   Title  The patient will improve PROM to 120 degrees for flexion and scaption.    Baseline  90 deg flexion    Time  6    Period  Weeks    Target Date  09/13/19      PT SHORT TERM GOAL #3   Title  The patient will improve PROM ER to 40 degrees    Baseline  20 deg    Time  6    Period  Weeks    Target Date  09/13/19      PT SHORT TERM GOAL #4   Title  The patient will tolerate isometric deltoid and periscapular AROM.    Time  6    Period  Weeks    Target Date  09/13/19        PT Long Term Goals - 08/02/19 1821      PT LONG TERM GOAL #1   Title  The patient will be indep with progession of HEP    Time  12    Period  Weeks    Target Date  10/25/19      PT LONG TERM GOAL #2   Title  The patient will improve AROM R shoulder to 45 degrees of flexion and scaption in sitting.    Baseline  Unable at eval due to protocol    Time  12    Period  Weeks    Target Date  10/25/19      PT LONG TERM GOAL #3   Title  The patient will be able to reach to the L shoulder for bathing/ADLs.    Time  12    Period  Weeks    Target Date  10/25/19      PT LONG TERM GOAL #4   Title  The patient will be able to reach to R ear and face for shaving and hygeine.    Time  12    Period  Weeks    Target Date  10/25/19      PT LONG TERM GOAL #5   Title  The patient will be able to reach to R side back pocket to demonstrate improved function R shoulder.    Time  12    Period  Weeks    Target Date  10/25/19            Plan - 08/15/19 1135    Clinical Impression Statement  Patient observed walking in from parking lot lifting his Rt UE to put his sling on over his head. Reviewed post op procautions. Cotninued with current exercise program; manual work; PROM and vaso    Rehab Potential  Fair    PT Frequency  2x / week    PT Duration  12 weeks    PT Treatment/Interventions  ADLs/Self Care Home Management;Electrical  Stimulation;Moist Heat;Cryotherapy;Patient/family education;Taping;Dry needling;Manual techniques;Therapeutic exercise;Therapeutic activities;Passive range of motion    PT Next Visit Plan  per protocol focusing on PROM, sub maximal deltoid isometrics, and scapular stabilization.    PT Home Exercise Plan  [place] and Agree with Plan of Care  Patient       Patient will benefit from skilled therapeutic intervention in order to improve the following deficits and impairments:     Visit Diagnosis: Acute pain of right shoulder  Muscle weakness (generalized)  Localized edema  Abnormal posture     Problem List Patient Active Problem List   Diagnosis Date Noted  . History of CVA (cerebrovascular accident) 06/25/2019  . Bruit 06/25/2019  . Dizziness 06/14/2019  . Essential hypertension 06/14/2019  . Injury of left shoulder 05/30/2019  . Rotator cuff tear, right 05/30/2019  . Loose stools 02/04/2019  . Gait disturbance 02/04/2019  . Cervical spondylosis 01/17/2019  . Normocytic anemia 01/16/2019  . Trigger finger, right ring finger 10/27/2018  . Renal insufficiency 06/29/2018  . At moderate risk for fall 06/29/2018  . Ambulates with cane 06/29/2018  . Memory difficulties 06/02/2018  . Diabetic autonomic neuropathy associated with type 2 diabetes mellitus ([place]) 06/02/2018  . Status post lumbar spinal fusion 06/02/2018  . S/P CABG x 5 06/02/2018  . Chronic obstructive pulmonary disease ([place]) 06/02/2018  . Shortness of breath 06/02/2018  . Diabetic nephropathy associated with type 2 diabetes mellitus ([place]) 06/02/2018  . Type 2 diabetes mellitus with diabetic neuropathy, unspecified ([place]) 06/02/2018  . Tachycardia with heart rate 100-120 beats per minute 06/02/2018  . Acromioclavicular arthrosis 06/02/2018  . Diabetic retinopathy ([place])   . Coronary artery disease   . Carotid arterial disease ([place])     Yukiko Minnich Nilda Simmer PT, MPH  08/15/2019, 11:37 AM  [facility] [place], [place], [address] Phone: [phone]   Fax:  [phone]  Name: Kelly Williams MRN: [id number] Date of Birth: 05-26-1940

## 2019-08-17 ENCOUNTER — Encounter: Payer: Self-pay | Admitting: Physical Therapy

## 2019-08-17 ENCOUNTER — Other Ambulatory Visit: Payer: Self-pay

## 2019-08-17 ENCOUNTER — Ambulatory Visit (INDEPENDENT_AMBULATORY_CARE_PROVIDER_SITE_OTHER): Payer: Medicare HMO | Admitting: Physical Therapy

## 2019-08-17 DIAGNOSIS — M6281 Muscle weakness (generalized): Secondary | ICD-10-CM

## 2019-08-17 DIAGNOSIS — M25511 Pain in right shoulder: Secondary | ICD-10-CM | POA: Diagnosis not present

## 2019-08-17 DIAGNOSIS — R600 Localized edema: Secondary | ICD-10-CM

## 2019-08-17 NOTE — Therapy (Signed)
[place], [place], [address] Phone: [phone]   Fax:  [phone]  Physical Therapy Treatment  Patient Details  Name: Kelly Williams MRN: [id number] Date of Birth: May 04, 1940 Referring Provider (PT): Ophelia Charter, MD   Encounter Date: 08/17/2019  PT End of Session - 08/17/19 1323    Visit Number  5    Number of Visits  24    Date for PT Re-Evaluation  10/25/19    Authorization Type  [employer] medicare    PT Start Time  1322    PT Stop Time  1406    PT Time Calculation (min)  44 min    Activity Tolerance  Patient tolerated treatment well    Behavior During Therapy  [facility] for tasks assessed/performed       Past Medical History:  Diagnosis Date  . Alcohol abuse   . Alcohol abuse   . Arthritis   . Carotid arterial disease ([place])   . Chronic kidney disease    CKD-patient denies  . COPD (chronic obstructive pulmonary disease) (HCC)    uses inhalers  . Coronary artery disease   . Diabetes mellitus without complication ([place])   . Diabetic retinopathy ([place])   . Hyperlipidemia   . Hypertension   . Normocytic anemia 01/16/2019  . Stroke [facility])    around 2020    Past Surgical History:  Procedure Laterality Date  . APPENDECTOMY     as teenager  . BACK SURGERY    . CAROTID ENDARTERECTOMY Right   . CORONARY ARTERY BYPASS GRAFT     10-12 years ago in [place] at [place] Right 07/18/2019   Procedure: REVERSE SHOULDER ARTHROPLASTY WITH SUBSCAPULAR REPAIR;  Surgeon: Hiram Gash, MD;  Location: [facility];  Service: Orthopedics;  Laterality: Right;  [name] VASECTOMY      There were no vitals filed for this visit.  Subjective Assessment - 08/17/19 1323    Subjective  No pain in the shoulder.  He states he has been laying in bed and raising his arms up towards head. "It doesn't feel good" to reach behind back, so he has been avoiding this.    Pertinent History  h/o RTC  repair, diabetic neuropathy,  CAD, COPD, HTN, DM type 2, diabetic retinopathy, stroke in 2020, and CABG x 5    Currently in Pain?  No/denies    Pain Score  0-No pain    Pain Onset  More than a month ago    Aggravating Factors   if he reaches arm behind back    Pain Relieving Factors  resting and avoiding aggrivating movements         [facility] PT Assessment - 08/17/19 0001      Assessment   Medical Diagnosis  R reverse total shoulder repair    Referring Provider (PT)  Ophelia Charter, MD    Onset Date/Surgical Date  07/18/19    Hand Dominance  Right    Prior Therapy  known to our clinic from prior therapy         [facility] Adult PT Treatment/Exercise - 08/17/19 0001      Self-Care   Self-Care  Other Self-Care Comments    Other Self-Care Comments   discussed shoulder precautions including keeping arm in sling, not using arm for ADLs, not completing unapproved exercises to prevent Rt shoulder dislocation and re-injury.       Shoulder Exercises: Supine   External Rotation  --  Flexion  --    Other Supine Exercises  --    Other Supine Exercises  --      Vasopneumatic   Number Minutes Vasopneumatic   10 minutes    Vasopnuematic Location   Shoulder    Vasopneumatic Pressure  Low    Vasopneumatic Temperature   34      Manual Therapy   Soft tissue mobilization  STM Rt shoulder girdle     Myofascial Release  anterior deltoid/biceps    Scapular Mobilization  Rt     Passive ROM  Rt shoulder flexion, scaption (to ~120 deg) , ER in scapular plane to tissue limits and no pain.         PT Education - 08/17/19 1417    Education Details  reviewed post op precautions- sling, no lifting, no active motions of Rt shoulder.    Person(s) Educated  Patient    Methods  Explanation;Demonstration    Comprehension  Verbalized understanding       PT Short Term Goals - 08/02/19 1817      PT SHORT TERM GOAL #1   Title  The patient will be indep with initial HEP (per protocol).    Time  6     Period  Weeks    Target Date  09/13/19      PT SHORT TERM GOAL #2   Title  The patient will improve PROM to 120 degrees for flexion and scaption.    Baseline  90 deg flexion    Time  6    Period  Weeks    Target Date  09/13/19      PT SHORT TERM GOAL #3   Title  The patient will improve PROM ER to 40 degrees    Baseline  20 deg    Time  6    Period  Weeks    Target Date  09/13/19      PT SHORT TERM GOAL #4   Title  The patient will tolerate isometric deltoid and periscapular AROM.    Time  6    Period  Weeks    Target Date  09/13/19        PT Long Term Goals - 08/17/19 1421      PT LONG TERM GOAL #1   Title  The patient will be indep with progession of HEP    Time  12    Period  Weeks      PT LONG TERM GOAL #2   Title  The patient will improve AROM R shoulder to 45 degrees of flexion and scaption in sitting.    Baseline  Unable at eval due to protocol    Time  12    Period  Weeks      PT LONG TERM GOAL #3   Title  The patient will be able to reach to the L shoulder for bathing/ADLs.    Time  12    Period  Weeks      PT LONG TERM GOAL #4   Title  The patient will be able to reach to R ear and face for shaving and hygeine.    Time  12    Period  Weeks      PT LONG TERM GOAL #5   Title  The patient will be able to reach to R side back pocket to demonstrate improved function R shoulder.    Time  12    Period  Weeks  Plan - 08/17/19 1424    Clinical Impression Statement  Pt observed moving Rt shoulder a great deal while putting sling on Rt arm in parking lot prior to walking into clinic. Reviewed post op precautions at great length and explained rationale as to not reinjure/require additional surgury; pt verbalized understanding.  Some guarding noted with PROM.  Pt is progressing well within protocol timeline.    Personal Factors and Comorbidities  Comorbidity 1;Comorbidity 3+;Comorbidity 2    Comorbidities  diabetes, HTN, COPD, falls     Examination-Activity Limitations  Bed Mobility;Locomotion Level;Lift;Carry;Reach Overhead    Examination-Participation Restrictions  Meal Prep    Stability/Clinical Decision Making  Stable/Uncomplicated    Rehab Potential  Fair    PT Frequency  2x / week    PT Duration  12 weeks    PT Treatment/Interventions  ADLs/Self Care Home Management;Electrical Stimulation;Moist Heat;Cryotherapy;Patient/family education;Taping;Dry needling;Manual techniques;Therapeutic exercise;Therapeutic activities;Passive range of motion    PT Next Visit Plan  per protocol focusing on PROM, sub maximal deltoid isometrics, and scapular stabilization.    PT Home Exercise Plan  [email or web address]    Consulted and Agree with Plan of Care  Patient       Patient will benefit from skilled therapeutic intervention in order to improve the following deficits and impairments:  Decreased range of motion, Pain, Hypomobility, Postural dysfunction, Decreased strength, Increased edema, Impaired tone, Impaired flexibility  Visit Diagnosis: Acute pain of right shoulder  Muscle weakness (generalized)  Localized edema     Problem List Patient Active Problem List   Diagnosis Date Noted  . History of CVA (cerebrovascular accident) 06/25/2019  . Bruit 06/25/2019  . Dizziness 06/14/2019  . Essential hypertension 06/14/2019  . Injury of left shoulder 05/30/2019  . Rotator cuff tear, right 05/30/2019  . Loose stools 02/04/2019  . Gait disturbance 02/04/2019  . Cervical spondylosis 01/17/2019  . Normocytic anemia 01/16/2019  . Trigger finger, right ring finger 10/27/2018  . Renal insufficiency 06/29/2018  . At moderate risk for fall 06/29/2018  . Ambulates with cane 06/29/2018  . Memory difficulties 06/02/2018  . Diabetic autonomic neuropathy associated with type 2 diabetes mellitus ([place]) 06/02/2018  . Status post lumbar spinal fusion 06/02/2018  . S/P CABG x 5 06/02/2018  . Chronic obstructive pulmonary disease ([place]) 06/02/2018   . Shortness of breath 06/02/2018  . Diabetic nephropathy associated with type 2 diabetes mellitus ([place]) 06/02/2018  . Type 2 diabetes mellitus with diabetic neuropathy, unspecified ([place]) 06/02/2018  . Tachycardia with heart rate 100-120 beats per minute 06/02/2018  . Acromioclavicular arthrosis 06/02/2018  . Diabetic retinopathy ([place])   . Coronary artery disease   . Carotid arterial disease [facility])     Ronaldo Miyamoto, SPTA 08/17/19 3:42 PM  Kerin Perna, PTA 08/17/19 3:45 PM   [facility] [place], [place], [address] Phone: [phone]   Fax:  [phone]  Name: Kelly Williams MRN: [id number] Date of Birth: 1940/12/22

## 2019-08-20 ENCOUNTER — Encounter: Payer: Self-pay | Admitting: Rehabilitative and Restorative Service Providers"

## 2019-08-20 ENCOUNTER — Ambulatory Visit (INDEPENDENT_AMBULATORY_CARE_PROVIDER_SITE_OTHER): Payer: Medicare HMO | Admitting: Rehabilitative and Restorative Service Providers"

## 2019-08-20 ENCOUNTER — Other Ambulatory Visit: Payer: Self-pay

## 2019-08-20 DIAGNOSIS — R293 Abnormal posture: Secondary | ICD-10-CM

## 2019-08-20 DIAGNOSIS — M6281 Muscle weakness (generalized): Secondary | ICD-10-CM

## 2019-08-20 DIAGNOSIS — M25511 Pain in right shoulder: Secondary | ICD-10-CM | POA: Diagnosis not present

## 2019-08-20 DIAGNOSIS — R600 Localized edema: Secondary | ICD-10-CM | POA: Diagnosis not present

## 2019-08-20 NOTE — Therapy (Signed)
[place], [place], [address] Phone: [phone]   Fax:  [phone]  Physical Therapy Treatment  Patient Details  Name: Kelly Williams MRN: [id number] Date of Birth: 09-02-1940 Referring Provider (PT): Ophelia Charter, MD   Encounter Date: 08/20/2019  PT End of Session - 08/20/19 1426    Visit Number  6    Number of Visits  24    Date for PT Re-Evaluation  10/25/19    Authorization Type  [employer] medicare    PT Start Time  1426    PT Stop Time  1511    PT Time Calculation (min)  45 min    Activity Tolerance  Patient tolerated treatment well       Past Medical History:  Diagnosis Date  . Alcohol abuse   . Alcohol abuse   . Arthritis   . Carotid arterial disease ([place])   . Chronic kidney disease    CKD-patient denies  . COPD (chronic obstructive pulmonary disease) (HCC)    uses inhalers  . Coronary artery disease   . Diabetes mellitus without complication ([place])   . Diabetic retinopathy ([place])   . Hyperlipidemia   . Hypertension   . Normocytic anemia 01/16/2019  . Stroke [facility])    around 2020    Past Surgical History:  Procedure Laterality Date  . APPENDECTOMY     as teenager  . BACK SURGERY    . CAROTID ENDARTERECTOMY Right   . CORONARY ARTERY BYPASS GRAFT     10-12 years ago in [place] at [place] Right 07/18/2019   Procedure: REVERSE SHOULDER ARTHROPLASTY WITH SUBSCAPULAR REPAIR;  Surgeon: Hiram Gash, MD;  Location: [facility];  Service: Orthopedics;  Laterality: Right;  [name] VASECTOMY      There were no vitals filed for this visit.  Subjective Assessment - 08/20/19 1428    Subjective  Resting shoulder and trying not to use arm - keeping arm in sling. Having minimal to no pain. He still has some pain trying to get to sleep at night and will wake up at night with pain. He sleeps with the sling in place.    Currently in Pain?  No/denies          [facility] PT Assessment - 08/20/19 0001      PROM   Right/Left Shoulder  --   pt supine - within tissue tolerance/extensibility pain free    Right Shoulder Flexion  120 Degrees    Right Shoulder ABduction  114 Degrees   in scapular plane    Right Shoulder External Rotation  48 Degrees   in scapular plane      Palpation   Palpation comment  muscular tightness upper traps; pecs; anterior deltoid; biceps                    [facility] Adult PT Treatment/Exercise - 08/20/19 0001      Elbow Exercises   Elbow Flexion  AROM;Right;10 reps;Seated    Forearm Supination  AROM;Both;10 reps    Forearm Pronation  AROM;Both;10 reps      Shoulder Exercises: Seated   Other Seated Exercises  shoulder rolls backward x 10 reps    Other Seated Exercises  scap squeeze with Rt UE supported on pillow 10 sec hold x 10 reps       Manual Therapy   Manual therapy comments  pt supine hooklying     Soft tissue  mobilization  STM Rt shoulder girdle - upper trap; leveator; pecs; biceps; deltoid; teres      Myofascial Release  anterior deltoid/biceps    Scapular Mobilization  Rt     Passive ROM  Rt shoulder flexion, scaption (to ~120 deg) , ER in scapular plane to tissue limits and no pain.              PT Education - 08/20/19 1504    Education Details  discussed sleeping position- suggested pt try pillow under elbow and arm - continued to reinforce post op precautions    Person(s) Educated  Patient    Methods  Explanation;Demonstration    Comprehension  Verbalized understanding       PT Short Term Goals - 08/02/19 1817      PT SHORT TERM GOAL #1   Title  The patient will be indep with initial HEP (per protocol).    Time  6    Period  Weeks    Target Date  09/13/19      PT SHORT TERM GOAL #2   Title  The patient will improve PROM to 120 degrees for flexion and scaption.    Baseline  90 deg flexion    Time  6    Period  Weeks    Target Date  09/13/19      PT SHORT TERM GOAL #3    Title  The patient will improve PROM ER to 40 degrees    Baseline  20 deg    Time  6    Period  Weeks    Target Date  09/13/19      PT SHORT TERM GOAL #4   Title  The patient will tolerate isometric deltoid and periscapular AROM.    Time  6    Period  Weeks    Target Date  09/13/19        PT Long Term Goals - 08/17/19 1421      PT LONG TERM GOAL #1   Title  The patient will be indep with progession of HEP    Time  12    Period  Weeks      PT LONG TERM GOAL #2   Title  The patient will improve AROM R shoulder to 45 degrees of flexion and scaption in sitting.    Baseline  Unable at eval due to protocol    Time  12    Period  Weeks      PT LONG TERM GOAL #3   Title  The patient will be able to reach to the L shoulder for bathing/ADLs.    Time  12    Period  Weeks      PT LONG TERM GOAL #4   Title  The patient will be able to reach to R ear and face for shaving and hygeine.    Time  12    Period  Weeks      PT LONG TERM GOAL #5   Title  The patient will be able to reach to R side back pocket to demonstrate improved function R shoulder.    Time  12    Period  Weeks            Plan - 08/20/19 1502    Clinical Impression Statement  Pain reported when Kelly Williams is attempting to get to sleep at night and pain awakens him from sleep at times. PROM is increasing. No active ROM or exercises per protocol.  Rehab Potential  Good    PT Frequency  2x / week    PT Duration  12 weeks    PT Treatment/Interventions  ADLs/Self Care Home Management;Electrical Stimulation;Moist Heat;Cryotherapy;Patient/family education;Taping;Dry needling;Manual techniques;Therapeutic exercise;Therapeutic activities;Passive range of motion    PT Next Visit Plan  per protocol focusing on PROM, sub maximal deltoid isometrics, and scapular stabilization.    PT Home Exercise Plan  [place] and Agree with Plan of Care  Patient       Patient will benefit from skilled therapeutic  intervention in order to improve the following deficits and impairments:     Visit Diagnosis: Acute pain of right shoulder  Muscle weakness (generalized)  Localized edema  Abnormal posture     Problem List Patient Active Problem List   Diagnosis Date Noted  . History of CVA (cerebrovascular accident) 06/25/2019  . Bruit 06/25/2019  . Dizziness 06/14/2019  . Essential hypertension 06/14/2019  . Injury of left shoulder 05/30/2019  . Rotator cuff tear, right 05/30/2019  . Loose stools 02/04/2019  . Gait disturbance 02/04/2019  . Cervical spondylosis 01/17/2019  . Normocytic anemia 01/16/2019  . Trigger finger, right ring finger 10/27/2018  . Renal insufficiency 06/29/2018  . At moderate risk for fall 06/29/2018  . Ambulates with cane 06/29/2018  . Memory difficulties 06/02/2018  . Diabetic autonomic neuropathy associated with type 2 diabetes mellitus ([place]) 06/02/2018  . Status post lumbar spinal fusion 06/02/2018  . S/P CABG x 5 06/02/2018  . Chronic obstructive pulmonary disease ([place]) 06/02/2018  . Shortness of breath 06/02/2018  . Diabetic nephropathy associated with type 2 diabetes mellitus ([place]) 06/02/2018  . Type 2 diabetes mellitus with diabetic neuropathy, unspecified ([place]) 06/02/2018  . Tachycardia with heart rate 100-120 beats per minute 06/02/2018  . Acromioclavicular arthrosis 06/02/2018  . Diabetic retinopathy ([place])   . Coronary artery disease   . Carotid arterial disease ([place])     Kelly Williams PT, MPH  08/20/2019, 3:05 PM  [facility] [place], [place], [address] Phone: [phone]   Fax:  [phone]  Name: Kelly Williams MRN: [id number] Date of Birth: 05/02/1941

## 2019-08-23 ENCOUNTER — Encounter: Payer: Self-pay | Admitting: Physical Therapy

## 2019-08-23 ENCOUNTER — Other Ambulatory Visit: Payer: Self-pay

## 2019-08-23 ENCOUNTER — Ambulatory Visit (INDEPENDENT_AMBULATORY_CARE_PROVIDER_SITE_OTHER): Payer: Medicare HMO | Admitting: Physical Therapy

## 2019-08-23 DIAGNOSIS — M25511 Pain in right shoulder: Secondary | ICD-10-CM

## 2019-08-23 DIAGNOSIS — M6281 Muscle weakness (generalized): Secondary | ICD-10-CM

## 2019-08-23 DIAGNOSIS — R293 Abnormal posture: Secondary | ICD-10-CM

## 2019-08-23 DIAGNOSIS — R600 Localized edema: Secondary | ICD-10-CM

## 2019-08-23 NOTE — Therapy (Signed)
[facility] [place], [place], [address] Phone: [phone]   Fax:  [phone]  Physical Therapy Treatment  Patient Details  Name: Kelly Williams MRN: [id number] Date of Birth: [birth year] Referring Provider (PT): Ophelia Charter, MD   Encounter Date: 08/23/2019  PT End of Session - 08/23/19 1358    Visit Number  7    Number of Visits  24    Date for PT Re-Evaluation  10/25/19    Authorization Type  [employer] medicare    PT Start Time  1400    PT Stop Time  1448    PT Time Calculation (min)  48 min       Past Medical History:  Diagnosis Date  . Alcohol abuse   . Alcohol abuse   . Arthritis   . Carotid arterial disease ([place])   . Chronic kidney disease    CKD-patient denies  . COPD (chronic obstructive pulmonary disease) (HCC)    uses inhalers  . Coronary artery disease   . Diabetes mellitus without complication ([place])   . Diabetic retinopathy ([place])   . Hyperlipidemia   . Hypertension   . Normocytic anemia 01/16/2019  . Stroke [facility])    around 2020    Past Surgical History:  Procedure Laterality Date  . APPENDECTOMY     as teenager  . BACK SURGERY    . CAROTID ENDARTERECTOMY Right   . CORONARY ARTERY BYPASS GRAFT     10-12 years ago in [place] at [place] Right 07/18/2019   Procedure: REVERSE SHOULDER ARTHROPLASTY WITH SUBSCAPULAR REPAIR;  Surgeon: Hiram Gash, MD;  Location: [facility];  Service: Orthopedics;  Laterality: Right;  [name] VASECTOMY      There were no vitals filed for this visit.  Subjective Assessment - 08/23/19 1400    Subjective  Resting shoulder when at home; using sling per doctors orders. No pain in Rt shoulder. He has not been using his Rt arm per orders from last visit. Pt voiced concerns of significant weight loss sincer surgery due to difficulty in food preparation.   Currently in Pain?  No/denies    Pain Score  0-No pain    Pain Onset   More than a month ago         [facility] PT Assessment - 08/23/19 0001      Assessment   Medical Diagnosis  R reverse total shoulder repair    Referring Provider (PT)  Ophelia Charter, MD    Onset Date/Surgical Date  07/18/19    Hand Dominance  Right    Prior Therapy  known to our clinic from prior therapy      ROM / Strength   AROM / PROM / Strength  PROM      PROM   Right Shoulder Flexion  137 Degrees    Right Shoulder ABduction  120 Degrees   in scapular plane   Right Shoulder External Rotation  45 Degrees   in scapular plane        [facility] Adult PT Treatment/Exercise - 08/23/19 0001      Elbow Exercises   Elbow Flexion  Strengthening;10 reps;Bar weights/barbell;Right;Seated   1 lb    Forearm Supination  Strengthening;Seated;10 reps;Right   2 sets   Bar Weights/Barbell (Forearm Supination)  1 lb    Forearm Pronation  Strengthening;10 reps;Seated;Right   2 sets   Bar Weights/Barbell (Forearm Pronation)  1 lb  Shoulder Exercises: Supine   External Rotation  PROM;10 reps   in scap plane, 10 second hold   Flexion  PROM;Right;10 reps   10 second hold   Other Supine Exercises  scap squeezes, X 10 reps, 5 sec hold   10 shoulder rolls after   Other Supine Exercises  PROM, scaption, to pt tolerance, x 10, 10 second hold      Shoulder Exercises: Isometric Strengthening   Flexion  --   seated, x 5, 5 sec hold   ABduction  --   seated, x 5, 5 seconds     Vasopneumatic   Number Minutes Vasopneumatic   10 minutes    Vasopnuematic Location   Shoulder    Vasopneumatic Pressure  Low    Vasopneumatic Temperature   34      Manual Therapy   Manual therapy comments  pt supine hooklying     Soft tissue mobilization  STM Rt upper trap, levator scap and rhomboid   supine   Scapular Mobilization  Rt scapula, in all directions      PT Education - 08/23/19 1447    Education Details  pt was educated on avoidance of active Rt shoulder motion    Methods  Explanation     Comprehension  Verbalized understanding       PT Short Term Goals - 08/23/19 1507      PT SHORT TERM GOAL #1   Title  The patient will be indep with initial HEP (per protocol).    Time  6    Period  Weeks    Status  On-going    Target Date  09/13/19      PT SHORT TERM GOAL #2   Title  The patient will improve PROM to 120 degrees for flexion and scaption.    Baseline  90 deg flexion    Time  6    Period  Weeks    Status  Achieved      PT SHORT TERM GOAL #3   Title  The patient will improve PROM ER to 40 degrees    Baseline  20 deg    Time  6    Period  Weeks    Status  Achieved    Target Date  09/13/19      PT SHORT TERM GOAL #4   Title  The patient will tolerate isometric deltoid and periscapular AROM.    Time  6    Period  Weeks    Status  Partially met    Target Date  09/13/19        PT Long Term Goals - 08/23/19 1359      PT LONG TERM GOAL #1   Title  The patient will be indep with progession of HEP    Time  12    Period  Weeks      PT LONG TERM GOAL #2   Title  The patient will improve AROM R shoulder to 45 degrees of flexion and scaption in sitting.    Baseline  Unable at eval due to protocol    Time  12    Period  Weeks      PT LONG TERM GOAL #3   Title  The patient will be able to reach to the L shoulder for bathing/ADLs.    Time  12    Period  Weeks      PT LONG TERM GOAL #4   Title  The patient will be able to  reach to R ear and face for shaving and hygeine.    Time  12    Period  Weeks      PT LONG TERM GOAL #5   Title  The patient will be able to reach to R side back pocket to demonstrate improved function R shoulder.    Time  12    Period  Weeks            Plan - 08/23/19 1509    Clinical Impression Statement  Pt has met STG's #2 and #3, PROM Rt shoulder. Pt has been adhering to protocol per pt statement; he was observed actively using shoulder without awareness donning and doffing phone to belt. He required VC to reduce force and  improve form with isometrics of Rt shoulder.  Pt was advised to contact his doctor for guidance regarding weight loss.     Comorbidities  diabetes, HTN, COPD, falls    Examination-Activity Limitations  Bed Mobility;Locomotion Level;Lift;Carry;Reach Overhead    Examination-Participation Restrictions  Meal Prep    Stability/Clinical Decision Making  Stable/Uncomplicated    Rehab Potential  Good    PT Frequency  2x / week    PT Duration  12 weeks    PT Treatment/Interventions  ADLs/Self Care Home Management;Electrical Stimulation;Moist Heat;Cryotherapy;Patient/family education;Taping;Dry needling;Manual techniques;Therapeutic exercise;Therapeutic activities;Passive range of motion    PT Next Visit Plan  per protocol focusing on PROM, sub maximal deltoid isometrics, and scapular stabilization. 08/29/19 protocol changes to include AAROM/AROM, provided doctors orders do not change.    PT Home Exercise Plan  [id number]    Consulted and Agree with Plan of Care  Patient       Patient will benefit from skilled therapeutic intervention in order to improve the following deficits and impairments:  Decreased range of motion, Pain, Hypomobility, Postural dysfunction, Decreased strength, Increased edema, Impaired tone, Impaired flexibility  Visit Diagnosis: Acute pain of right shoulder  Muscle weakness (generalized)  Localized edema  Abnormal posture     Problem List Patient Active Problem List   Diagnosis Date Noted  . History of CVA (cerebrovascular accident) 06/25/2019  . Bruit 06/25/2019  . Dizziness 06/14/2019  . Essential hypertension 06/14/2019  . Injury of left shoulder 05/30/2019  . Rotator cuff tear, right 05/30/2019  . Loose stools 02/04/2019  . Gait disturbance 02/04/2019  . Cervical spondylosis 01/17/2019  . Normocytic anemia 01/16/2019  . Trigger finger, right ring finger 10/27/2018  . Renal insufficiency 06/29/2018  . At moderate risk for fall 06/29/2018  . Ambulates with  cane 06/29/2018  . Memory difficulties 06/02/2018  . Diabetic autonomic neuropathy associated with type 2 diabetes mellitus ([place]) 06/02/2018  . Status post lumbar spinal fusion 06/02/2018  . S/P CABG x 5 06/02/2018  . Chronic obstructive pulmonary disease ([place]) 06/02/2018  . Shortness of breath 06/02/2018  . Diabetic nephropathy associated with type 2 diabetes mellitus ([place]) 06/02/2018  . Type 2 diabetes mellitus with diabetic neuropathy, unspecified ([place]) 06/02/2018  . Tachycardia with heart rate 100-120 beats per minute 06/02/2018  . Acromioclavicular arthrosis 06/02/2018  . Diabetic retinopathy ([place])   . Coronary artery disease   . Carotid arterial disease ([place])     Ronaldo Miyamoto, SPTA 08/23/19 3:15 PM  This entire session was performed under direct supervision and direction of a licensed Physical Brewing technologist. I have personally read, edited and approved of the note as written.  Kerin Perna, PTA 08/23/19 3:49 PM    [facility] [address]  [place], [place], [address] Phone: [phone]   Fax:  [phone]  Name: Kelly Williams MRN: [id number] Date of Birth: Sep 03, 1940

## 2019-08-24 ENCOUNTER — Ambulatory Visit (INDEPENDENT_AMBULATORY_CARE_PROVIDER_SITE_OTHER): Payer: Medicare HMO

## 2019-08-24 ENCOUNTER — Ambulatory Visit (INDEPENDENT_AMBULATORY_CARE_PROVIDER_SITE_OTHER): Payer: Medicare HMO | Admitting: Family Medicine

## 2019-08-24 ENCOUNTER — Encounter: Payer: Self-pay | Admitting: Family Medicine

## 2019-08-24 VITALS — BP 150/89 | HR 103 | Ht 66.0 in | Wt 123.0 lb

## 2019-08-24 DIAGNOSIS — E1143 Type 2 diabetes mellitus with diabetic autonomic (poly)neuropathy: Secondary | ICD-10-CM

## 2019-08-24 DIAGNOSIS — R634 Abnormal weight loss: Secondary | ICD-10-CM

## 2019-08-24 DIAGNOSIS — G629 Polyneuropathy, unspecified: Secondary | ICD-10-CM | POA: Diagnosis not present

## 2019-08-24 MED ORDER — GABAPENTIN 300 MG PO CAPS: 300.0000 mg | ORAL_CAPSULE | Freq: Three times a day (TID) | ORAL | 3 refills | Status: DC

## 2019-08-24 NOTE — Progress Notes (Signed)
Kelly Williams - 79 y.o. male MRN [id number]  Date of birth: 28-Jul-1940  Subjective Chief Complaint  Patient presents with  . Weight Loss    HPI Kelly Williams is a 79 y.o. male with history of HTN, CAD s/p CABG, T2DM, HLD and recent shoulder surgery here today with toe pain and weight loss.    -Weight loss:  He has been doing post-op PT and and therapist noticed he has had increased weight loss over the past several weeks.  Weight 138lbs (06/14/19)-->123lbs today.  He is not trying to lose weight.  Reports that appetite has been decreased. He did have some constipation after surgery but report that this has resolved. He does have early satiety.  He has a remote history of smoking but does have COPD.  He denies increased respiratory symptoms, chest pain, dizziness, nausea, vomiting, diarrhea, reflux symptoms, urinary symptoms.  -Toe pain:  Pain in great toe of R foot.  Has neuropathy with decreased sensation of foot. Pain is burning and throbbing sensation. Denies redness, swelling or warmth.    ROS:  A comprehensive ROS was completed and negative except as noted per HPI  Allergies  Allergen Reactions  . Metformin And Related Diarrhea    Past Medical History:  Diagnosis Date  . Alcohol abuse   . Alcohol abuse   . Arthritis   . Carotid arterial disease ([place])   . Chronic kidney disease    CKD-patient denies  . COPD (chronic obstructive pulmonary disease) (HCC)    uses inhalers  . Coronary artery disease   . Diabetes mellitus without complication ([place])   . Diabetic retinopathy ([place])   . Hyperlipidemia   . Hypertension   . Normocytic anemia 01/16/2019  . Stroke [facility])    around 2020    Past Surgical History:  Procedure Laterality Date  . APPENDECTOMY     as teenager  . BACK SURGERY    . CAROTID ENDARTERECTOMY Right   . CORONARY ARTERY BYPASS GRAFT     10-12 years ago in [place] at [place] Right 07/18/2019   Procedure: REVERSE  SHOULDER ARTHROPLASTY WITH SUBSCAPULAR REPAIR;  Surgeon: Hiram Gash, MD;  [facility];  Service: Orthopedics;  Laterality: Right;  [name] VASECTOMY      Social History   Socioeconomic History  . Marital status: Divorced    Spouse name: Not on file  . Number of children: Not on file  . Years of education: Not on file  . Highest education level: Not on file  Occupational History  . Not on file  Tobacco Use  . Smoking status: Former Smoker    Packs/day: 1.00    Years: 10.00    Pack years: 10.00    Types: Cigarettes    Quit date: 10/12/2002    Years since quitting: 16.8  . Smokeless tobacco: Never Used  Substance and Sexual Activity  . Alcohol use: Not Currently  . Drug use: Never  . Sexual activity: Not Currently    Birth control/protection: Abstinence, Surgical  Other Topics Concern  . Not on file  Social History Narrative  . Not on file   Social Determinants of Health   Financial Resource Strain:   . Difficulty of Paying Living Expenses:   Food Insecurity:   . Worried About Charity fundraiser in the Last Year:   . Arboriculturist in the Last Year:   Transportation Needs:   . Lack of Transportation (  Medical):   [name] Lack of Transportation (Non-Medical):   Physical Activity:   . Days of Exercise per Week:   . Minutes of Exercise per Session:   Stress:   . Feeling of Stress :   Social Connections:   . Frequency of Communication with Friends and Family:   . Frequency of Social Gatherings with Friends and Family:   . Attends Religious Services:   . Active Member of Clubs or Organizations:   . Attends Archivist Meetings:   [name] Marital Status:     No family history on file.  Health Maintenance  Topic Date Due  . OPHTHALMOLOGY EXAM  08/24/2019 (Originally 03/01/2019)  . COVID-19 Vaccine (1) 04/02/2020 (Originally 10[birth year]11/1956)  . FOOT EXAM  08/23/2020 (Originally 06/02/2019)  . INFLUENZA VACCINE  12/02/2019  . HEMOGLOBIN A1C  12/12/2019  . TETANUS/TDAP   06/01/2025  . PNA vac Low Risk Adult  Completed     ----------------------------------------------------------------------------------------------------------------------------------------------------------------------------------------------------------------- Physical Exam BP (!) 150/89   Pulse (!) 103   Ht 5\' 6"  (1.676 m)   Wt 123 lb (55.8 kg)   BMI 19.85 kg/m   Physical Exam Constitutional:      Appearance: Normal appearance.  HENT:     Head: Normocephalic and atraumatic.  Eyes:     General: No scleral icterus. Cardiovascular:     Rate and Rhythm: Normal rate and regular rhythm.  Pulmonary:     Effort: Pulmonary effort is normal.     Breath sounds: Normal breath sounds.  Abdominal:     General: Abdomen is flat. There is no distension.     Palpations: Abdomen is soft.     Tenderness: There is no abdominal tenderness.  Musculoskeletal:     Cervical back: Neck supple.  Neurological:     General: No focal deficit present.     Mental Status: He is alert and oriented to person, place, and time.  Psychiatric:        Mood and Affect: Mood normal.        Behavior: Behavior normal.     ------------------------------------------------------------------------------------------------------------------------------------------------------------------------------------------------------------------- Assessment and Plan  Unintentional weight loss Orders Placed This Encounter  Procedures  . DG Chest 2 View    Standing Status:   Future    Standing Expiration Date:   10/23/2020    Order Specific Question:   Reason for Exam (SYMPTOM  OR DIAGNOSIS REQUIRED)    Answer:   Unintentional weight loss, remote smoking history.    Order Specific Question:   Preferred imaging location?    Answer:   [facility]    Order Specific Question:   Radiology Contrast Protocol - do NOT remove file path    Answer:   \\[email or web address]\Radiant\DXFluoroContrastProtocols.pdf  . COMPLETE  METABOLIC PANEL WITH GFR  . CBC  . TSH  . B12 and Folate Panel  Labs and CXR ordered.  Denies significant constipation.  Having decreased appetite and early satiety, may need GI referral to discuss having endoscopy if this isn't improving.    Diabetic autonomic neuropathy associated with type 2 diabetes mellitus (HCC) Adding gabapentin today.  Update b12 levels.    Meds ordered this encounter  Medications  . gabapentin (NEURONTIN) 300 MG capsule    Sig: Take 1 capsule (300 mg total) by mouth 3 (three) times daily.    Dispense:  90 capsule    Refill:  3    No follow-ups on file.    This visit occurred during the SARS-CoV-2 public health emergency.  Safety protocols were in  place, including screening questions prior to the visit, additional usage of staff PPE, and extensive cleaning of exam room while observing appropriate contact time as indicated for disinfecting solutions.

## 2019-08-24 NOTE — Assessment & Plan Note (Signed)
Adding gabapentin today.  Update b12 levels.

## 2019-08-24 NOTE — Patient Instructions (Signed)
Try gabapentin for neuropathy.  Have lab work and xray completed today.  We'll be in touch with results once these return.

## 2019-08-24 NOTE — Assessment & Plan Note (Signed)
Orders Placed This Encounter  Procedures  . DG Chest 2 View    Standing Status:   Future    Standing Expiration Date:   10/23/2020    Order Specific Question:   Reason for Exam (SYMPTOM  OR DIAGNOSIS REQUIRED)    Answer:   Unintentional weight loss, remote smoking history.    Order Specific Question:   Preferred imaging location?    Answer:   [facility]    Order Specific Question:   Radiology Contrast Protocol - do NOT remove file path    Answer:   \\[email or web address]\Radiant\DXFluoroContrastProtocols.pdf  . COMPLETE METABOLIC PANEL WITH GFR  . CBC  . TSH  . B12 and Folate Panel  Labs and CXR ordered.  Denies significant constipation.  Having decreased appetite and early satiety, may need GI referral to discuss having endoscopy if this isn't improving.

## 2019-08-25 LAB — COMPLETE METABOLIC PANEL WITH GFR
AG Ratio: 1.3 (calc) (ref 1.0–2.5)
ALT: 11 U/L (ref 9–46)
AST: 16 U/L (ref 10–35)
Albumin: 4.2 g/dL (ref 3.6–5.1)
Alkaline phosphatase (APISO): 107 U/L (ref 35–144)
BUN/Creatinine Ratio: 22 (calc) (ref 6–22)
BUN: 29 mg/dL — ABNORMAL HIGH (ref 7–25)
CO2: 22 mmol/L (ref 20–32)
Calcium: 9.5 mg/dL (ref 8.6–10.3)
Chloride: 106 mmol/L (ref 98–110)
Creat: 1.34 mg/dL — ABNORMAL HIGH (ref 0.70–1.18)
GFR, Est African American: 58 mL/min/{1.73_m2} — ABNORMAL LOW (ref >=60)
GFR, Est Non African American: 50 mL/min/{1.73_m2} — ABNORMAL LOW (ref >=60)
Globulin: 3.2 g/dL (calc) (ref 1.9–3.7)
Glucose, Bld: 186 mg/dL — ABNORMAL HIGH (ref 65–139)
Potassium: 4.9 mmol/L (ref 3.5–5.3)
Sodium: 137 mmol/L (ref 135–146)
Total Bilirubin: 0.7 mg/dL (ref 0.2–1.2)
Total Protein: 7.4 g/dL (ref 6.1–8.1)

## 2019-08-25 LAB — CBC
HCT: 38.6 % (ref 38.5–50.0)
Hemoglobin: 12.6 g/dL — ABNORMAL LOW (ref 13.2–17.1)
MCH: 30.4 pg (ref 27.0–33.0)
MCHC: 32.6 g/dL (ref 32.0–36.0)
MCV: 93 fL (ref 80.0–100.0)
MPV: 9.6 fL (ref 7.5–12.5)
Platelets: 337 10*3/uL (ref 140–400)
RBC: 4.15 10*6/uL — ABNORMAL LOW (ref 4.20–5.80)
RDW: 12.2 % (ref 11.0–15.0)
WBC: 8.3 10*3/uL (ref 3.8–10.8)

## 2019-08-25 LAB — TSH: TSH: 2 mIU/L (ref 0.40–4.50)

## 2019-08-27 ENCOUNTER — Other Ambulatory Visit: Payer: Self-pay

## 2019-08-27 ENCOUNTER — Ambulatory Visit (INDEPENDENT_AMBULATORY_CARE_PROVIDER_SITE_OTHER): Payer: Medicare HMO | Admitting: Rehabilitative and Restorative Service Providers"

## 2019-08-27 ENCOUNTER — Encounter: Payer: Self-pay | Admitting: Rehabilitative and Restorative Service Providers"

## 2019-08-27 DIAGNOSIS — R293 Abnormal posture: Secondary | ICD-10-CM

## 2019-08-27 DIAGNOSIS — M25511 Pain in right shoulder: Secondary | ICD-10-CM | POA: Diagnosis not present

## 2019-08-27 DIAGNOSIS — M6281 Muscle weakness (generalized): Secondary | ICD-10-CM

## 2019-08-27 DIAGNOSIS — R600 Localized edema: Secondary | ICD-10-CM | POA: Diagnosis not present

## 2019-08-27 NOTE — Therapy (Signed)
[place], [place], [address] Phone: [phone]   Fax:  [phone]  Physical Therapy Treatment  Patient Details  Name: Kelly Williams MRN: [id number] Date of Birth: 1940-07-02 Referring Provider (PT): Ophelia Charter, MD   Encounter Date: 08/27/2019  PT End of Session - 08/27/19 1347    Visit Number  8    Number of Visits  24    Date for PT Re-Evaluation  10/25/19    Authorization Type  [employer] medicare    PT Start Time  1347    PT Stop Time  1435    PT Time Calculation (min)  48 min    Activity Tolerance  Patient tolerated treatment well       Past Medical History:  Diagnosis Date  . Alcohol abuse   . Alcohol abuse   . Arthritis   . Carotid arterial disease ([place])   . Chronic kidney disease    CKD-patient denies  . COPD (chronic obstructive pulmonary disease) (HCC)    uses inhalers  . Coronary artery disease   . Diabetes mellitus without complication ([place])   . Diabetic retinopathy ([place])   . Hyperlipidemia   . Hypertension   . Normocytic anemia 01/16/2019  . Stroke [facility])    around 2020    Past Surgical History:  Procedure Laterality Date  . APPENDECTOMY     as teenager  . BACK SURGERY    . CAROTID ENDARTERECTOMY Right   . CORONARY ARTERY BYPASS GRAFT     10-12 years ago in [place] at [place] Right 07/18/2019   Procedure: REVERSE SHOULDER ARTHROPLASTY WITH SUBSCAPULAR REPAIR;  Surgeon: Hiram Gash, MD;  [facility];  Service: Orthopedics;  Laterality: Right;  [name] VASECTOMY      There were no vitals filed for this visit.  Subjective Assessment - 08/27/19 1348    Subjective  Patient reports that he is not having any pain. He is sleeping well.    Patient [place] to be able to brush my teeth and comb my hair    Currently in Pain?  No/denies                       [facility] Adult PT Treatment/Exercise - 08/27/19  0001      Shoulder Exercises: Standing   External Rotation  AROM;Both;5 reps   to pt tolerance    Flexion  AROM;Both;10 reps   cues for scapular positioning    ABduction  AROM;Both;10 reps   in scapular plane - cues for scapular position    Extension  AROM;Both;10 reps      Shoulder Exercises: Isometric Strengthening   Extension  --   3 sec hold x 10 seated in chair VC for scapular control    ABduction  --   standing 3 sec hold x 10 reps initiating deltoid      Vasopneumatic   Number Minutes Vasopneumatic   10 minutes    Vasopnuematic Location   Shoulder    Vasopneumatic Pressure  Medium    Vasopneumatic Temperature   34      Manual Therapy   Manual therapy comments  pt supine hooklying     Soft tissue mobilization  working through the Rt shoulder girdle in upper trap/leveator; pecs; teres; deltoid     Scapular Mobilization  Rt    Passive ROM  Rt shoulder flexion, scaption (to ~120 deg) ,  ER in scapular plane to tissue limits and no pain.             PT Education - 08/27/19 1410    Education Details  HEP    Person(s) Educated  Patient    Methods  Explanation;Demonstration;Tactile cues;Verbal cues;Handout    Comprehension  Verbalized understanding;Returned demonstration;Verbal cues required;Tactile cues required       PT Short Term Goals - 08/23/19 1507      PT SHORT TERM GOAL #1   Title  The patient will be indep with initial HEP (per protocol).    Time  6    Period  Weeks    Status  On-going    Target Date  09/13/19      PT SHORT TERM GOAL #2   Title  The patient will improve PROM to 120 degrees for flexion and scaption.    Baseline  90 deg flexion    Time  6    Period  Weeks    Status  Achieved      PT SHORT TERM GOAL #3   Title  The patient will improve PROM ER to 40 degrees    Baseline  20 deg    Time  6    Period  Weeks    Status  Achieved    Target Date  09/13/19      PT SHORT TERM GOAL #4   Title  The patient will tolerate isometric deltoid  and periscapular AROM.    Time  6    Period  Weeks    Status  On-going    Target Date  09/13/19        PT Long Term Goals - 08/23/19 1359      PT LONG TERM GOAL #1   Title  The patient will be indep with progession of HEP    Time  12    Period  Weeks      PT LONG TERM GOAL #2   Title  The patient will improve AROM R shoulder to 45 degrees of flexion and scaption in sitting.    Baseline  Unable at eval due to protocol    Time  12    Period  Weeks      PT LONG TERM GOAL #3   Title  The patient will be able to reach to the L shoulder for bathing/ADLs.    Time  12    Period  Weeks      PT LONG TERM GOAL #4   Title  The patient will be able to reach to R ear and face for shaving and hygeine.    Time  12    Period  Weeks      PT LONG TERM GOAL #5   Title  The patient will be able to reach to R side back pocket to demonstrate improved function R shoulder.    Time  12    Period  Weeks            Plan - 08/27/19 1403    Clinical Impression Statement  Patient added active exercise in standing and isometric deltoid in standing/extension in sitting. Continues to progress with rehab. Messaged MD to see when Ara can d/c sling.    Rehab Potential  Good    PT Frequency  2x / week    PT Duration  12 weeks    PT Treatment/Interventions  ADLs/Self Care Home Management;Electrical Stimulation;Moist Heat;Cryotherapy;Patient/family education;Taping;Dry needling;Manual techniques;Therapeutic exercise;Therapeutic activities;Passive range of motion  PT Next Visit Plan  per protocol focusing on PROM, assess response to new exercises; progress per protocol - messaged MD to see if [place] can d/c sling    PT Home Exercise Plan  [email or web address]    Consulted and Agree with Plan of Care  Patient       Patient will benefit from skilled therapeutic intervention in order to improve the following deficits and impairments:     Visit Diagnosis: Acute pain of right shoulder  Muscle weakness  (generalized)  Localized edema  Abnormal posture     Problem List Patient Active Problem List   Diagnosis Date Noted  . Unintentional weight loss 08/24/2019  . History of CVA (cerebrovascular accident) 06/25/2019  . Bruit 06/25/2019  . Dizziness 06/14/2019  . Essential hypertension 06/14/2019  . Injury of left shoulder 05/30/2019  . Rotator cuff tear, right 05/30/2019  . Loose stools 02/04/2019  . Gait disturbance 02/04/2019  . Cervical spondylosis 01/17/2019  . Normocytic anemia 01/16/2019  . Trigger finger, right ring finger 10/27/2018  . Renal insufficiency 06/29/2018  . At moderate risk for fall 06/29/2018  . Ambulates with cane 06/29/2018  . Memory difficulties 06/02/2018  . Diabetic autonomic neuropathy associated with type 2 diabetes mellitus ([place]) 06/02/2018  . Status post lumbar spinal fusion 06/02/2018  . S/P CABG x 5 06/02/2018  . Chronic obstructive pulmonary disease ([place]) 06/02/2018  . Shortness of breath 06/02/2018  . Diabetic nephropathy associated with type 2 diabetes mellitus ([place]) 06/02/2018  . Type 2 diabetes mellitus with diabetic neuropathy, unspecified ([place]) 06/02/2018  . Tachycardia with heart rate 100-120 beats per minute 06/02/2018  . Acromioclavicular arthrosis 06/02/2018  . Diabetic retinopathy ([place])   . Coronary artery disease   . Carotid arterial disease ([place])     Torre [place]umb[place] Nilda Simmer PT, MPH  08/27/2019, 2:25 PM  [facility] [place], [place], [address] Phone: [phone]   Fax:  [phone]  Name: Kelly Williams MRN: [id number] Date of Birth: 31-Jul-1940

## 2019-08-27 NOTE — Patient Instructions (Signed)
Access Code: [email or web address]: [email or web address]: 04/26/2021Prepared by: Chrles Selley HoltExercises  Seated Shoulder Pendulum Exercise - 2 x daily - 7 x weekly - 10 reps - 1 sets  Elbow AROM Flexion & Extension Neutral Forearm - 3 x daily - 7 x weekly - 1 sets - 5-10 reps  Standing Backward Shoulder Rolls - 3 x daily - 7 x weekly - 3 sets - 5 reps  Seated Cervical Sidebending AROM - 3 x daily - 7 x weekly - 1 sets - 3 reps - 15 hold  Seated Isometric Shoulder Flexion - 2 x daily - 7 x weekly - 1 sets - 10 reps - 5 seconds hold  Seated Isometric Shoulder External Rotation - 2 x daily - 7 x weekly - 1 sets - 10 reps - 5 seconds hold  Isometric Shoulder Abduction at Wall - 1 x daily - 7 x weekly - 1 sets - 10 reps - 5 sec hold  Seated Isometric Shoulder Extension - 1 x daily - 7 x weekly - 1 sets - 10 reps - 5 sec hold  Standing Shoulder Scaption - 1 x daily - 7 x weekly - 1 sets - 10 reps  Standing Shoulder Flexion to 90 Degrees - 1 x daily - 7 x weekly - 1 sets - 10 reps

## 2019-08-29 ENCOUNTER — Other Ambulatory Visit: Payer: Self-pay | Admitting: Family Medicine

## 2019-08-29 DIAGNOSIS — E1169 Type 2 diabetes mellitus with other specified complication: Secondary | ICD-10-CM

## 2019-08-29 NOTE — Telephone Encounter (Signed)
With his recent weight loss I would recommend we d/c this for now as it may be contributing to his weight loss and decreased appetite.

## 2019-08-29 NOTE — Telephone Encounter (Signed)
Cal states he used the last pen of Trulicity last week. Medication never prescribed by Dr Zigmund Daniel.

## 2019-08-29 NOTE — Telephone Encounter (Signed)
PT was seen on 08/24/19.  He called in requesting a refill on a certain medication he forgot to bring up during the appointment. No changes in pharmacy   Trulicity .75mg 

## 2019-08-29 NOTE — Addendum Note (Signed)
Addended by: Narda Rutherford on: 08/29/2019 03:47 PM   Modules accepted: Orders

## 2019-08-29 NOTE — Telephone Encounter (Signed)
Patient advised.

## 2019-08-30 ENCOUNTER — Other Ambulatory Visit: Payer: Self-pay

## 2019-08-30 ENCOUNTER — Ambulatory Visit (INDEPENDENT_AMBULATORY_CARE_PROVIDER_SITE_OTHER): Payer: Medicare HMO | Admitting: Physical Therapy

## 2019-08-30 ENCOUNTER — Encounter: Payer: Self-pay | Admitting: Physical Therapy

## 2019-08-30 DIAGNOSIS — M25511 Pain in right shoulder: Secondary | ICD-10-CM | POA: Diagnosis not present

## 2019-08-30 DIAGNOSIS — R600 Localized edema: Secondary | ICD-10-CM

## 2019-08-30 DIAGNOSIS — M6281 Muscle weakness (generalized): Secondary | ICD-10-CM | POA: Diagnosis not present

## 2019-08-30 NOTE — Therapy (Signed)
[place], [place], [address] Phone: [phone]   Fax:  [phone]  Physical Therapy Treatment  Patient Details  Name: Kelly Williams MRN: [id number] Date of Birth: [birth year] Referring Provider (PT): Ophelia Charter, MD   Encounter Date: 08/30/2019  PT End of Session - 08/30/19 1459    Visit Number  9    Number of Visits  24    Date for PT Re-Evaluation  10/25/19    Authorization Type  [place] medicare    PT Start Time  1447    PT Stop Time  1530    PT Time Calculation (min)  43 min       Past Medical History:  Diagnosis Date  . Alcohol abuse   . Alcohol abuse   . Arthritis   . Carotid arterial disease ([place])   . Chronic kidney disease    CKD-patient denies  . COPD (chronic obstructive pulmonary disease) (HCC)    uses inhalers  . Coronary artery disease   . Diabetes mellitus without complication ([place])   . Diabetic retinopathy ([place])   . Hyperlipidemia   . Hypertension   . Normocytic anemia 01/16/2019  . Stroke [facility])    around 2020    Past Surgical History:  Procedure Laterality Date  . APPENDECTOMY     as teenager  . BACK SURGERY    . CAROTID ENDARTERECTOMY Right   . CORONARY ARTERY BYPASS GRAFT     10-12 years ago in [place] at [place] Right 07/18/2019   Procedure: REVERSE SHOULDER ARTHROPLASTY WITH SUBSCAPULAR REPAIR;  Surgeon: Hiram Gash, MD;  Location: [facility];  Service: Orthopedics;  Laterality: Right;  [name] VASECTOMY      There were no vitals filed for this visit.  Subjective Assessment - 08/30/19 1748    Subjective  Pt is no longer required to wear sling per doctor . He is not having any pain and has been doing his HEP daily, per his report. He is sleeping well with no pain. Pt reports that he has not been lifting anything with Rt arm but has been actively moving it for a week; adherance to protocol was emphasized.    Currently in  Pain?  No/denies    Pain Score  0-No pain         [facility] PT Assessment - 08/30/19 0001      Assessment   Medical Diagnosis  R reverse total shoulder repair    Referring Provider (PT)  Ophelia Charter, MD    Onset Date/Surgical Date  07/18/19    Hand Dominance  Right    Prior Therapy  known to our clinic from prior therapy         [facility] Adult PT Treatment/Exercise - 08/30/19 0001      Shoulder Exercises: Supine   External Rotation  PROM;AAROM;Right;5 reps;10 reps   PROM 5, 10 sec ; AAROM with cane, 3 sec   Flexion  PROM;AAROM    ABduction  PROM;AAROM;Left;10 reps;5 reps   in scap plane, 5 reps PROM, AAROM with cane     Shoulder Exercises: Seated   Flexion  AROM;Left;10 reps   in front of mirror for self correction, tactile cues to not elevate scapula     Shoulder Exercises: Isometric Strengthening   Flexion  3X5"   standing at wall   ABduction  3X5"   standing at wall     Vasopneumatic   Number Minutes  Vasopneumatic   10 minutes    Vasopnuematic Location   Shoulder    Vasopneumatic Pressure  Medium    Vasopneumatic Temperature   34        PT Education - 08/30/19 1537    Education Details  HEP    Person(s) Educated  Patient    Methods  Explanation;Handout;Demonstration;Tactile cues    Comprehension  Verbalized understanding;Returned demonstration;Verbal cues required;Tactile cues required       PT Short Term Goals - 08/23/19 1507      PT SHORT TERM GOAL #1   Title  The patient will be indep with initial HEP (per protocol).    Time  6    Period  Weeks    Status  On-going    Target Date  09/13/19      PT SHORT TERM GOAL #2   Title  The patient will improve PROM to 120 degrees for flexion and scaption.    Baseline  90 deg flexion    Time  6    Period  Weeks    Status  Achieved      PT SHORT TERM GOAL #3   Title  The patient will improve PROM ER to 40 degrees    Baseline  20 deg    Time  6    Period  Weeks    Status  Achieved    Target Date  09/13/19       PT SHORT TERM GOAL #4   Title  The patient will tolerate isometric deltoid and periscapular AROM.    Time  6    Period  Weeks    Status  On-going    Target Date  09/13/19        PT Long Term Goals - 08/30/19 1701      PT LONG TERM GOAL #1   Title  The patient will be indep with progession of HEP    Time  12    Period  Weeks      PT LONG TERM GOAL #2   Title  The patient will improve AROM R shoulder to 45 degrees of flexion and scaption in sitting.    Baseline  Unable at eval due to protocol    Time  12    Period  Weeks    Status  On-going      PT LONG TERM GOAL #3   Title  The patient will be able to reach to the L shoulder for bathing/ADLs.    Time  12    Period  Weeks    Status  On-going      PT LONG TERM GOAL #4   Title  The patient will be able to reach to R ear and face for shaving and hygeine.    Time  12    Period  Weeks    Status  On-going      PT LONG TERM GOAL #5   Title  The patient will be able to reach to R side back pocket to demonstrate improved function R shoulder.    Time  12    Period  Weeks    Status  On-going        Plan - 08/30/19 1525    Clinical Impression Statement  AAROM/AROM of Rt shoulder progressing well within  protocol. Pt tolerated treatment well and was enthusiastic about addinng in active Rt shoulder motions.    Rehab Potential  Good    PT Frequency  2x / week  PT Duration  12 weeks    PT Treatment/Interventions  ADLs/Self Care Home Management;Electrical Stimulation;Moist Heat;Cryotherapy;Patient/family education;Taping;Dry needling;Manual techniques;Therapeutic exercise;Therapeutic activities;Passive range of motion    PT Next Visit Plan  progress per rehab protocol.  assess response to new exercises. Per supervising PT; move to 1x/wk after next week; encouraged continued compliance with precautions. 10th visit    PT Home Exercise Plan  [facility]       Patient will benefit from skilled therapeutic intervention in order  to improve the following deficits and impairments:     Visit Diagnosis: Acute pain of right shoulder  Muscle weakness (generalized)  Localized edema     Problem List Patient Active Problem List   Diagnosis Date Noted  . Unintentional weight loss 08/24/2019  . History of CVA (cerebrovascular accident) 06/25/2019  . Bruit 06/25/2019  . Dizziness 06/14/2019  . Essential hypertension 06/14/2019  . Injury of left shoulder 05/30/2019  . Rotator cuff tear, right 05/30/2019  . Loose stools 02/04/2019  . Gait disturbance 02/04/2019  . Cervical spondylosis 01/17/2019  . Normocytic anemia 01/16/2019  . Trigger finger, right ring finger 10/27/2018  . Renal insufficiency 06/29/2018  . At moderate risk for fall 06/29/2018  . Ambulates with cane 06/29/2018  . Memory difficulties 06/02/2018  . Diabetic autonomic neuropathy associated with type 2 diabetes mellitus ([place]) 06/02/2018  . Status post lumbar spinal fusion 06/02/2018  . S/P CABG x 5 06/02/2018  . Chronic obstructive pulmonary disease ([place]) 06/02/2018  . Shortness of breath 06/02/2018  . Diabetic nephropathy associated with type 2 diabetes mellitus ([place]) 06/02/2018  . Type 2 diabetes mellitus with diabetic neuropathy, unspecified ([place]) 06/02/2018  . Tachycardia with heart rate 100-120 beats per minute 06/02/2018  . Acromioclavicular arthrosis 06/02/2018  . Diabetic retinopathy ([place])   . Coronary artery disease   . Carotid arterial disease ([place])    This entire session was performed under direct supervision and direction of a licensed Physical Brewing technologist. I have personally read, edited and approved of the note as written.  Kerin Perna, PTA 08/30/19 5:56 PM  Ronaldo Miyamoto, SPTA 08/30/19 5:51 PM    [facility] [place], [place], [address] Phone: [phone]   Fax:  [phone]  Name: Kelly Williams MRN: [id number] Date of Birth:  06/13/1940

## 2019-08-30 NOTE — Patient Instructions (Signed)
Access Code: [email or web address]: [email or web address]: 04/29/2021Prepared by: [place]  Seated Shoulder Pendulum Exercise - 2 x daily - 7 x weekly - 10 reps - 1 sets  Elbow AROM Flexion & Extension Neutral Forearm - 3 x daily - 7 x weekly - 1 sets - 5-10 reps  Standing Backward Shoulder Rolls - 3 x daily - 7 x weekly - 3 sets - 5 reps  Seated Cervical Sidebending AROM - 3 x daily - 7 x weekly - 1 sets - 3 reps - 15 hold  Isometric Shoulder Abduction at Wall - 1 x daily - 7 x weekly - 1 sets - 10 reps - 5 sec hold  Standing Shoulder Scaption - 1 x daily - 7 x weekly - 1 sets - 10 reps  Standing Shoulder Flexion to 90 Degrees - 1 x daily - 7 x weekly - 1 sets - 10 reps  Supine Shoulder Flexion Extension AAROM with [place] - 1 x daily - 7 x weekly - 1 sets - 10 reps

## 2019-09-03 ENCOUNTER — Other Ambulatory Visit: Payer: Self-pay

## 2019-09-05 ENCOUNTER — Encounter: Payer: Medicare HMO | Admitting: Physical Therapy

## 2019-09-05 ENCOUNTER — Telehealth: Payer: Self-pay | Admitting: Physical Therapy

## 2019-09-05 NOTE — Telephone Encounter (Signed)
Patient did not show for PT appt.  Called and spoke to patient regarding missed appt.  Patient reported he was running late. Informed patient it was too late for him to arrive, since this was already 15 min past appt time. Confirmed upcoming appt time and day with patient.    Kerin Perna, PTA 09/05/19 10:34 AM

## 2019-09-07 ENCOUNTER — Encounter: Payer: Self-pay | Admitting: Physical Therapy

## 2019-09-07 ENCOUNTER — Other Ambulatory Visit: Payer: Self-pay | Admitting: Family Medicine

## 2019-09-07 ENCOUNTER — Ambulatory Visit (INDEPENDENT_AMBULATORY_CARE_PROVIDER_SITE_OTHER): Payer: Medicare HMO | Admitting: Physical Therapy

## 2019-09-07 ENCOUNTER — Other Ambulatory Visit: Payer: Self-pay

## 2019-09-07 DIAGNOSIS — R600 Localized edema: Secondary | ICD-10-CM | POA: Diagnosis not present

## 2019-09-07 DIAGNOSIS — M6281 Muscle weakness (generalized): Secondary | ICD-10-CM

## 2019-09-07 DIAGNOSIS — M25511 Pain in right shoulder: Secondary | ICD-10-CM | POA: Diagnosis not present

## 2019-09-07 DIAGNOSIS — E1169 Type 2 diabetes mellitus with other specified complication: Secondary | ICD-10-CM

## 2019-09-07 DIAGNOSIS — I2510 Atherosclerotic heart disease of native coronary artery without angina pectoris: Secondary | ICD-10-CM

## 2019-09-07 DIAGNOSIS — R293 Abnormal posture: Secondary | ICD-10-CM

## 2019-09-07 MED ORDER — GABAPENTIN 300 MG PO CAPS: 300.0000 mg | ORAL_CAPSULE | Freq: Three times a day (TID) | ORAL | 3 refills | Status: AC

## 2019-09-07 MED ORDER — TRUE METRIX BLOOD GLUCOSE TEST VI STRP: ORAL_STRIP | 12 refills | Status: AC

## 2019-09-07 MED ORDER — TRUEPLUS LANCETS 30G MISC: 2 refills | Status: AC

## 2019-09-07 MED ORDER — TRULICITY 0.75 MG/0.5ML ~~LOC~~ SOAJ: 0.7500 mg | SUBCUTANEOUS | 2 refills | Status: DC

## 2019-09-07 MED ORDER — ATORVASTATIN CALCIUM 40 MG PO TABS: 40.0000 mg | ORAL_TABLET | Freq: Every day | ORAL | 3 refills | Status: DC

## 2019-09-07 MED ORDER — CELECOXIB 100 MG PO CAPS: 100.0000 mg | ORAL_CAPSULE | Freq: Two times a day (BID) | ORAL | 2 refills | Status: DC | PRN

## 2019-09-07 MED ORDER — BD SWAB SINGLE USE REGULAR PADS: MEDICATED_PAD | 2 refills | Status: AC

## 2019-09-07 NOTE — Therapy (Signed)
[place], [place], [address] Phone: [phone]   Fax:  [phone]  Physical Therapy Treatment Progress Note Reporting Period 08/02/19 to 09/07/19  See note below for Objective Data and Assessment of Progress/Goals.         Patient Details  Name: Kelly Williams MRN: [id number] Date of Birth: 04-12-1941 Referring Provider (PT): Ophelia Charter, MD   Encounter Date: 09/07/2019  PT End of Session - 09/07/19 0959    Visit Number  10    Number of Visits  24    Date for PT Re-Evaluation  10/25/19    Authorization Type  [place] medicare    PT Start Time  1018    PT Stop Time  1103    PT Time Calculation (min)  45 min    Behavior During Therapy  [facility] for tasks assessed/performed       Past Medical History:  Diagnosis Date  . Alcohol abuse   . Alcohol abuse   . Arthritis   . Carotid arterial disease ([place])   . Chronic kidney disease    CKD-patient denies  . COPD (chronic obstructive pulmonary disease) (HCC)    uses inhalers  . Coronary artery disease   . Diabetes mellitus without complication ([place])   . Diabetic retinopathy ([place])   . Hyperlipidemia   . Hypertension   . Normocytic anemia 01/16/2019  . Stroke [facility])    around 2020    Past Surgical History:  Procedure Laterality Date  . APPENDECTOMY     as teenager  . BACK SURGERY    . CAROTID ENDARTERECTOMY Right   . CORONARY ARTERY BYPASS GRAFT     10-12 years ago in [place] at [place] Right 07/18/2019   Procedure: REVERSE SHOULDER ARTHROPLASTY WITH SUBSCAPULAR REPAIR;  Surgeon: Hiram Gash, MD;  Location: [facility];  Service: Orthopedics;  Laterality: Right;  [name] VASECTOMY      There were no vitals filed for this visit.  Subjective Assessment - 09/07/19 1023    Subjective  Pt reports HEP is going well at home and he has not been lifting anything with the Rt arm. Pt reports he continues to have to use  his LUE to assist his RUE with shaving and combing of hair. He stated during session that he has been using a yellow band to "do these exercises at home", in reference to Rt shoulder ER.    Pertinent History  h/o RTC repair, diabetic neuropathy,  CAD, COPD, HTN, DM type 2, diabetic retinopathy, stroke in 2020, and CABG x 5    Patient Stated Goals  Hope to be able to brush my teeth and comb my hair    Currently in Pain?  No/denies    Pain Score  0-No pain         [facility] PT Assessment - 09/07/19 0001      Assessment   Medical Diagnosis  R reverse total shoulder repair    Referring Provider (PT)  Ophelia Charter, MD    Onset Date/Surgical Date  07/18/19    Hand Dominance  Right    Prior Therapy  known to our clinic from prior therapy      Observation/Other Assessments   Focus on Therapeutic Outcomes (FOTO)   46% limited       ROM / Strength   AROM / PROM / Strength  AROM;PROM;Strength      AROM   AROM Assessment Site  Shoulder    Right/Left Shoulder  Right    Right Shoulder Flexion  105 Degrees   seated   Right Shoulder ABduction  110 Degrees   scaption, seated     Strength   Strength Assessment Site  Hand    Right/Left hand  Right;Left    Right Hand Grip (lbs)  45   dynamometer   Left Hand Grip (lbs)  50   dynamometer      [facility] Adult PT Treatment/Exercise - 09/07/19 0001      Elbow Exercises   Elbow Flexion  Strengthening;10 reps;Bar weights/barbell;Right;Seated    Bar Weights/Barbell (Elbow Flexion)  1 lb    Elbow Flexion Limitations  trial 2 lb- was not tolerated well, increased bicep tendon discomfort- discontinued after 2 reps      Shoulder Exercises: Supine   External Rotation  AAROM;Right;10 reps   with cane 3 sec hold   Flexion  AAROM;10 reps;Right;Left   with cane, chest press in between   Other Supine Exercises  scap squeezes, X 10 reps, 5 sec hold   10 shoulder rolls after     Shoulder Exercises: Seated   Flexion  AROM;Left;5 reps   in front of mirror for  self correction   Abduction  AROM;Right;5 reps   scaption; mirror for visual feedback   Other Seated Exercises  shoulder rolls after pulleys, x 10, 1 set      Shoulder Exercises: Pulleys   Flexion  2 minutes    Scaption  2 minutes    ABduction  --    ABduction Limitations  trial 2 reps, dissonctinued due to increase in fatigue and discomfort of Rt shoulder      Shoulder Exercises: Isometric Strengthening   Flexion  5X5"   seated to SPTA hand and pt opp hand   External Rotation  5X5"   seated to SPTA hand and pt opp hand, no pain   ABduction  5X5"   seated to SPTA hand and pt opp hand     Hand Exercises   Other Hand Exercises  hand master in Rt hand, light, x10 ext and flex with squeeze      Vasopneumatic   Number Minutes Vasopneumatic   10 minutes    Vasopnuematic Location   Shoulder   Rt    Vasopneumatic Pressure  Low    Vasopneumatic Temperature   34               PT Short Term Goals - 09/07/19 1234      PT SHORT TERM GOAL #1   Title  The patient will be indep with initial HEP (per protocol).    Time  6    Period  Weeks    Status  On-going    Target Date  09/13/19      PT SHORT TERM GOAL #2   Title  The patient will improve PROM to 120 degrees for flexion and scaption.    Baseline  90 deg flexion    Time  6    Period  Weeks    Status  Achieved      PT SHORT TERM GOAL #3   Title  The patient will improve PROM ER to 40 degrees    Baseline  20 deg    Time  6    Period  Weeks    Status  Achieved    Target Date  09/13/19      PT SHORT TERM GOAL #4   Title  The  patient will tolerate isometric deltoid and periscapular AROM.    Baseline  09/07/19 no pain    Time  6    Period  Weeks    Status  Achieved    Target Date  09/13/19        PT Long Term Goals - 09/07/19 1232      PT LONG TERM GOAL #1   Title  The patient will be indep with progession of HEP    Time  12    Period  Weeks    Status  On-going      PT LONG TERM GOAL #2   Title  The  patient will improve AROM R shoulder to 45 degrees of flexion and scaption in sitting.    Baseline  Rt flex-105 deg, Rt scap- 110 deg in seated (09/07/19)    Time  12    Period  Weeks    Status  Achieved      PT LONG TERM GOAL #3   Title  The patient will be able to reach to the L shoulder for bathing/ADLs.    Time  12    Period  Weeks    Status  On-going      PT LONG TERM GOAL #4   Title  The patient will be able to reach to R ear and face for shaving and hygeine.    Baseline  can reach but Lt arm must assist during active motion (09/07/19)    Time  12    Period  Weeks    Status  Partially Met      PT LONG TERM GOAL #5   Title  The patient will be able to reach to R side back pocket to demonstrate improved function R shoulder.    Time  12    Period  Weeks    Status  On-going            Plan - 09/07/19 1222    Clinical Impression Statement  AAROM of Rt shoulder progressing well today in seated position. Time spent re-educating pt regarding rehab protocol/restrictions; encouraged him to avoid exercises NOT in current HEP.  Pt reminded of healing process and to not push Rt shoulder to the point of pain. Spoke to supervising PT regarding decreasing freq to 1x/wk until next phase of rehab.  Pt has met STG #4, LTG #2 and has partially met LTG# 4; all other goals on-going.    Personal Factors and Comorbidities  Comorbidity 1;Comorbidity 3+;Comorbidity 2    Comorbidities  diabetes, HTN, COPD, falls    Examination-Activity Limitations  Bed Mobility;Locomotion Level;Lift;Carry;Reach Overhead    Examination-Participation Restrictions  Meal Prep    Rehab Potential  Good    PT Frequency  2x / week    PT Duration  12 weeks    PT Treatment/Interventions  ADLs/Self Care Home Management;Electrical Stimulation;Moist Heat;Cryotherapy;Patient/family education;Taping;Dry needling;Manual techniques;Therapeutic exercise;Therapeutic activities;Passive range of motion    PT Next Visit Plan  progress  per rehab protocol.  encourage continued compliance with precautions.    PT Home Exercise Plan  [email or web address]    Consulted and Agree with Plan of Care  Patient       Patient will benefit from skilled therapeutic intervention in order to improve the following deficits and impairments:  Decreased range of motion, Pain, Hypomobility, Postural dysfunction, Decreased strength, Increased edema, Impaired tone, Impaired flexibility  Visit Diagnosis: Acute pain of right shoulder  Muscle weakness (generalized)  Localized edema  Abnormal posture  Problem List Patient Active Problem List   Diagnosis Date Noted  . Unintentional weight loss 08/24/2019  . History of CVA (cerebrovascular accident) 06/25/2019  . Bruit 06/25/2019  . Dizziness 06/14/2019  . Essential hypertension 06/14/2019  . Injury of left shoulder 05/30/2019  . Rotator cuff tear, right 05/30/2019  . Loose stools 02/04/2019  . Gait disturbance 02/04/2019  . Cervical spondylosis 01/17/2019  . Normocytic anemia 01/16/2019  . Trigger finger, right ring finger 10/27/2018  . Renal insufficiency 06/29/2018  . At moderate risk for fall 06/29/2018  . Ambulates with cane 06/29/2018  . Memory difficulties 06/02/2018  . Diabetic autonomic neuropathy associated with type 2 diabetes mellitus ([place]) 06/02/2018  . Status post lumbar spinal fusion 06/02/2018  . S/P CABG x 5 06/02/2018  . Chronic obstructive pulmonary disease ([place]) 06/02/2018  . Shortness of breath 06/02/2018  . Diabetic nephropathy associated with type 2 diabetes mellitus ([place]) 06/02/2018  . Type 2 diabetes mellitus with diabetic neuropathy, unspecified ([place]) 06/02/2018  . Tachycardia with heart rate 100-120 beats per minute 06/02/2018  . Acromioclavicular arthrosis 06/02/2018  . Diabetic retinopathy ([place])   . Coronary artery disease   . Carotid arterial disease ([place])     Ronaldo Miyamoto, SPTA 09/07/19 1:44 PM  This entire session was performed under direct  supervision and direction of a licensed Physical Brewing technologist. I have personally read, edited and approved of the note as written.  Kerin Perna, PTA 09/07/19 1:44 PM  Celyn P. Helene Kelp PT, MPH 09/07/19 2:14 PM   [facility] [place], [place], [address] Phone: [phone]   Fax:  [phone]  Name: Kelly Williams MRN: [id number] Date of Birth: February 21, 1941

## 2019-09-10 ENCOUNTER — Ambulatory Visit: Payer: Medicare HMO | Admitting: Cardiology

## 2019-09-10 ENCOUNTER — Other Ambulatory Visit: Payer: Self-pay

## 2019-09-10 ENCOUNTER — Encounter: Payer: Medicare HMO | Admitting: Physical Therapy

## 2019-09-10 DIAGNOSIS — E1169 Type 2 diabetes mellitus with other specified complication: Secondary | ICD-10-CM

## 2019-09-10 MED ORDER — CELECOXIB 100 MG PO CAPS: 100.0000 mg | ORAL_CAPSULE | Freq: Two times a day (BID) | ORAL | 2 refills | Status: AC | PRN

## 2019-09-10 MED ORDER — TRULICITY 0.75 MG/0.5ML ~~LOC~~ SOAJ: 0.7500 mg | SUBCUTANEOUS | 2 refills | Status: AC

## 2019-09-13 ENCOUNTER — Encounter: Payer: Self-pay | Admitting: Rehabilitative and Restorative Service Providers"

## 2019-09-13 ENCOUNTER — Other Ambulatory Visit: Payer: Self-pay

## 2019-09-13 ENCOUNTER — Telehealth: Payer: Self-pay

## 2019-09-13 ENCOUNTER — Ambulatory Visit (INDEPENDENT_AMBULATORY_CARE_PROVIDER_SITE_OTHER): Payer: Medicare HMO | Admitting: Rehabilitative and Restorative Service Providers"

## 2019-09-13 DIAGNOSIS — M25511 Pain in right shoulder: Secondary | ICD-10-CM

## 2019-09-13 DIAGNOSIS — M6281 Muscle weakness (generalized): Secondary | ICD-10-CM | POA: Diagnosis not present

## 2019-09-13 DIAGNOSIS — R600 Localized edema: Secondary | ICD-10-CM

## 2019-09-13 DIAGNOSIS — E1169 Type 2 diabetes mellitus with other specified complication: Secondary | ICD-10-CM

## 2019-09-13 DIAGNOSIS — R293 Abnormal posture: Secondary | ICD-10-CM

## 2019-09-13 MED ORDER — TRUE METRIX AIR GLUCOSE METER DEVI: 99 refills | Status: AC

## 2019-09-13 NOTE — Therapy (Signed)
[place], [place], [address] Phone: [phone]   Fax:  [phone]  Physical Therapy Treatment  Patient Details  Name: Kelly Williams MRN: [id number] Date of Birth: 08-Jul-1940 Referring Provider (PT): Ophelia Charter, MD   Encounter Date: 09/13/2019  PT End of Session - 09/13/19 1018    Visit Number  11    Number of Visits  24    Date for PT Re-Evaluation  10/25/19    Authorization Type  [employer] medicare    PT Start Time  1016    PT Stop Time  1101    PT Time Calculation (min)  45 min    Activity Tolerance  Patient tolerated treatment well       Past Medical History:  Diagnosis Date  . Alcohol abuse   . Alcohol abuse   . Arthritis   . Carotid arterial disease ([place])   . Chronic kidney disease    CKD-patient denies  . COPD (chronic obstructive pulmonary disease) (HCC)    uses inhalers  . Coronary artery disease   . Diabetes mellitus without complication ([place])   . Diabetic retinopathy ([place])   . Hyperlipidemia   . Hypertension   . Normocytic anemia 01/16/2019  . Stroke [facility])    around 2020    Past Surgical History:  Procedure Laterality Date  . APPENDECTOMY     as teenager  . BACK SURGERY    . CAROTID ENDARTERECTOMY Right   . CORONARY ARTERY BYPASS GRAFT     10-12 years ago in [place] at [place] Right 07/18/2019   Procedure: REVERSE SHOULDER ARTHROPLASTY WITH SUBSCAPULAR REPAIR;  Surgeon: Hiram Gash, MD;  Location: [facility];  Service: Orthopedics;  Laterality: Right;  [name] VASECTOMY      There were no vitals filed for this visit.  Subjective Assessment - 09/13/19 1019    Subjective  Shoulder is feeling okay - he has been doing exercises at home. Feels like he is getting stronger    Currently in Pain?  No/denies    Pain Score  0-No pain                        [facility] Adult PT Treatment/Exercise - 09/13/19 0001      Shoulder  Exercises: Seated   Elevation  AROM;Strengthening;Right;20 reps    Other Seated Exercises  scapular depression pushing into the large red ball - hold 5 sec x 10 x 2 sets       Shoulder Exercises: Pulleys   Flexion  2 minutes    Scaption  2 minutes      Shoulder Exercises: Therapy Ball   Other Therapy Ball Exercises  working on bilat UE function with hands on ball and rolled towel - elbow flex/ext; pressing ball out and pulling back; supination and pronation with ball UE's slightly extended in front; diagonaly shoulder to opposite knee both directions      Vasopneumatic   Number Minutes Vasopneumatic   10 minutes    Vasopnuematic Location   Shoulder   Rt    Vasopneumatic Pressure  Low    Vasopneumatic Temperature   34      Manual Therapy   Manual therapy comments  pt sitting     Soft tissue mobilization  working through the Rt shoulder girdle in upper trap/leveator; pecs; teres; deltoid     Scapular Mobilization  Rt  PT Education - 09/13/19 1041    Education Details  HEP    Person(s) Educated  Patient    Methods  Explanation;Demonstration;Tactile cues;Verbal cues;Handout    Comprehension  Verbalized understanding;Returned demonstration;Verbal cues required;Tactile cues required       PT Short Term Goals - 09/07/19 1234      PT SHORT TERM GOAL #1   Title  The patient will be indep with initial HEP (per protocol).    Time  6    Period  Weeks    Status  On-going    Target Date  09/13/19      PT SHORT TERM GOAL #2   Title  The patient will improve PROM to 120 degrees for flexion and scaption.    Baseline  90 deg flexion    Time  6    Period  Weeks    Status  Achieved      PT SHORT TERM GOAL #3   Title  The patient will improve PROM ER to 40 degrees    Baseline  20 deg    Time  6    Period  Weeks    Status  Achieved    Target Date  09/13/19      PT SHORT TERM GOAL #4   Title  The patient will tolerate isometric deltoid and periscapular AROM.     Baseline  09/07/19 no pain    Time  6    Period  Weeks    Status  Achieved    Target Date  09/13/19        PT Long Term Goals - 09/07/19 1232      PT LONG TERM GOAL #1   Title  The patient will be indep with progession of HEP    Time  12    Period  Weeks    Status  On-going      PT LONG TERM GOAL #2   Title  The patient will improve AROM R shoulder to 45 degrees of flexion and scaption in sitting.    Baseline  Rt flex-105 deg, Rt scap- 110 deg in seated (09/07/19)    Time  12    Period  Weeks    Status  Achieved      PT LONG TERM GOAL #3   Title  The patient will be able to reach to the L shoulder for bathing/ADLs.    Time  12    Period  Weeks    Status  On-going      PT LONG TERM GOAL #4   Title  The patient will be able to reach to R ear and face for shaving and hygeine.    Baseline  can reach but Lt arm must assist during active motion (09/07/19)    Time  12    Period  Weeks    Status  Partially Met      PT LONG TERM GOAL #5   Title  The patient will be able to reach to R side back pocket to demonstrate improved function R shoulder.    Time  12    Period  Weeks    Status  On-going            Plan - 09/13/19 1054    Clinical Impression Statement  Added UE exercises in sitting - working with the ball in bilat UE's which patient did well with and will work on at home with a rolled towel. Pt continues to demonstrate elevation of Rt  shoulder girdle with Rt shoulder elevation. Worked on scapular depression and verbal/tactile cues to control posterior shoulder girdle with shoulder elevation. Progressing well with shoulder rehab.    Rehab Potential  Good    PT Frequency  2x / week    PT Duration  12 weeks    PT Treatment/Interventions  ADLs/Self Care Home Management;Electrical Stimulation;Moist Heat;Cryotherapy;Patient/family education;Taping;Dry needling;Manual techniques;Therapeutic exercise;Therapeutic activities;Passive range of motion    PT Next Visit Plan  progress  per rehab protocol.  assess response to new exercises. encouraged continued compliance with precautions    PT Home Exercise Plan  [place]; written program see Instructions    Consulted and Agree with Plan of Care  Patient       Patient will benefit from skilled therapeutic intervention in order to improve the following deficits and impairments:     Visit Diagnosis: Acute pain of right shoulder  Muscle weakness (generalized)  Localized edema  Abnormal posture     Problem List Patient Active Problem List   Diagnosis Date Noted  . Unintentional weight loss 08/24/2019  . History of CVA (cerebrovascular accident) 06/25/2019  . Bruit 06/25/2019  . Dizziness 06/14/2019  . Essential hypertension 06/14/2019  . Injury of left shoulder 05/30/2019  . Rotator cuff tear, right 05/30/2019  . Loose stools 02/04/2019  . Gait disturbance 02/04/2019  . Cervical spondylosis 01/17/2019  . Normocytic anemia 01/16/2019  . Trigger finger, right ring finger 10/27/2018  . Renal insufficiency 06/29/2018  . At moderate risk for fall 06/29/2018  . Ambulates with cane 06/29/2018  . Memory difficulties 06/02/2018  . Diabetic autonomic neuropathy associated with type 2 diabetes mellitus ([place]) 06/02/2018  . Status post lumbar spinal fusion 06/02/2018  . S/P CABG x 5 06/02/2018  . Chronic obstructive pulmonary disease ([place]) 06/02/2018  . Shortness of breath 06/02/2018  . Diabetic nephropathy associated with type 2 diabetes mellitus ([place]) 06/02/2018  . Type 2 diabetes mellitus with diabetic neuropathy, unspecified ([place]) 06/02/2018  . Tachycardia with heart rate 100-120 beats per minute 06/02/2018  . Acromioclavicular arthrosis 06/02/2018  . Diabetic retinopathy ([place])   . Coronary artery disease   . Carotid arterial disease ([place])     Agapito Hanway Nilda Simmer PT, MPH  09/13/2019, 11:04 AM  [facility] [place], [place], [address] Phone:  [phone]   Fax:  [phone]  Name: Kelly Williams MRN: [id number] Date of Birth: 1941/01/24

## 2019-09-13 NOTE — Patient Instructions (Signed)
Sitting straight and tall shoulder blades down and back    - press into the bed or chair with hands hold for 5 sec and repeat 10 times   Using a rolled towel;  - chest to thighs - push out from chest toward front - steering wheel - wood chopping - both directions - push wrists up and down - push overhead to top of your head

## 2019-09-13 NOTE — Telephone Encounter (Signed)
Sent glucometer to [employer]

## 2019-09-17 ENCOUNTER — Encounter: Payer: Medicare HMO | Admitting: Physical Therapy

## 2019-09-17 ENCOUNTER — Telehealth: Payer: Self-pay | Admitting: Physical Therapy

## 2019-09-17 NOTE — Telephone Encounter (Signed)
Patient did not show for PT appt.  Called patient and spoke to him; states he overslept and forgot his appt.  Confirmed next upcoming appt with him.    Kerin Perna, PTA 09/17/19 11:24 AM

## 2019-09-20 ENCOUNTER — Other Ambulatory Visit: Payer: Self-pay

## 2019-09-20 ENCOUNTER — Ambulatory Visit (INDEPENDENT_AMBULATORY_CARE_PROVIDER_SITE_OTHER): Payer: Medicare HMO | Admitting: Physical Therapy

## 2019-09-20 VITALS — HR 87

## 2019-09-20 DIAGNOSIS — M6281 Muscle weakness (generalized): Secondary | ICD-10-CM

## 2019-09-20 DIAGNOSIS — M25511 Pain in right shoulder: Secondary | ICD-10-CM | POA: Diagnosis not present

## 2019-09-20 DIAGNOSIS — R293 Abnormal posture: Secondary | ICD-10-CM

## 2019-09-20 NOTE — Therapy (Signed)
[place], [place], [address] Phone: [phone]   Fax:  [phone]  Physical Therapy Treatment  Patient Details  Name: Kelly Williams MRN: [id number] Date of Birth: 10-Jun-1940 Referring Provider (PT): Ophelia Charter, MD   Encounter Date: 09/20/2019  PT End of Session - 09/20/19 1107    Visit Number  12    Number of Visits  24    Date for PT Re-Evaluation  10/25/19    Authorization Type  [employer] medicare    PT Start Time  1102    PT Stop Time  1144    PT Time Calculation (min)  42 min    Activity Tolerance  Patient tolerated treatment well    Behavior During Therapy  [facility] for tasks assessed/performed       Past Medical History:  Diagnosis Date  . Alcohol abuse   . Alcohol abuse   . Arthritis   . Carotid arterial disease ([place])   . Chronic kidney disease    CKD-patient denies  . COPD (chronic obstructive pulmonary disease) (HCC)    uses inhalers  . Coronary artery disease   . Diabetes mellitus without complication ([place])   . Diabetic retinopathy ([place])   . Hyperlipidemia   . Hypertension   . Normocytic anemia 01/16/2019  . Stroke [facility])    around 2020    Past Surgical History:  Procedure Laterality Date  . APPENDECTOMY     as teenager  . BACK SURGERY    . CAROTID ENDARTERECTOMY Right   . CORONARY ARTERY BYPASS GRAFT     10-12 years ago in [place] at [place] Right 07/18/2019   Procedure: REVERSE SHOULDER ARTHROPLASTY WITH SUBSCAPULAR REPAIR;  Surgeon: Hiram Gash, MD;  Location: [facility];  Service: Orthopedics;  Laterality: Right;  [name] VASECTOMY      Vitals:   09/20/19 1129  Pulse: 87  SpO2: 94%    Subjective Assessment - 09/20/19 1107    Subjective  Pt reports he is no longer using bands at home. Has mostly been doing reaching overhead exercises.  "I can almost shave with my Rt hand now"    Patient [place] to be able to brush my  teeth and comb my hair    Currently in Pain?  No/denies         [facility] PT Assessment - 09/20/19 0001      Assessment   Medical Diagnosis  R reverse total shoulder repair    Referring Provider (PT)  Ophelia Charter, MD    Onset Date/Surgical Date  07/18/19    Hand Dominance  Right    Prior Therapy  known to our clinic from prior therapy      AROM   Right Shoulder Flexion  124 Degrees   standing   Right Shoulder ABduction  117 Degrees   standing   Right Shoulder Internal Rotation  20 Degrees   seated with arm at side.      PROM   Right Shoulder External Rotation  80 Degrees   in supported scaption (supine)      [facility] Adult PT Treatment/Exercise - 09/20/19 0001      Elbow Exercises   Elbow Flexion  Strengthening;Both;5 reps   3 sets   Bar Weights/Barbell (Elbow Flexion)  2 lbs      Shoulder Exercises: Supine   Horizontal ABduction  AROM;Both;10 reps   arm is T to clap hands  Shoulder Exercises: Seated   Flexion  AROM;Right;10 reps   to 90 deg with cues to not elevate scapula   Abduction  AROM;Right;10 reps   just above 90 deg with cues to not elevate scapula   Other Seated Exercises  scapular depression pushing into the large green ball - hold 5 sec x 10 x 2 sets       Shoulder Exercises: Sidelying   External Rotation  AROM;Right;10 reps   2 sets     Shoulder Exercises: Pulleys   Flexion  2 minutes    Scaption  2 minutes    Other Pulley Exercises  4 reps with eccentric lowering of Rt arm from end range flexion      Shoulder Exercises: ROM/Strengthening   Rhythmic Stabilization, Supine  RUE 90 deg x 3 reps of 15 sec perterbations      Manual Therapy   Soft tissue mobilization  working through [employer] and shoulder girdle.       Seated L's x 5 sec x 10 reps, 2 sets. Scap squeeze with tactile cues x 5 sec x 10 reps  Pt unable to raise RUE to low W position.           PT Short Term Goals - 09/07/19 1234      PT SHORT TERM GOAL #1   Title  The patient  will be indep with initial HEP (per protocol).    Time  6    Period  Weeks    Status  On-going    Target Date  09/13/19      PT SHORT TERM GOAL #2   Title  The patient will improve PROM to 120 degrees for flexion and scaption.    Baseline  90 deg flexion    Time  6    Period  Weeks    Status  Achieved      PT SHORT TERM GOAL #3   Title  The patient will improve PROM ER to 40 degrees    Baseline  20 deg    Time  6    Period  Weeks    Status  Achieved    Target Date  09/13/19      PT SHORT TERM GOAL #4   Title  The patient will tolerate isometric deltoid and periscapular AROM.    Baseline  09/07/19 no pain    Time  6    Period  Weeks    Status  Achieved    Target Date  09/13/19        PT Long Term Goals - 09/07/19 1232      PT LONG TERM GOAL #1   Title  The patient will be indep with progession of HEP    Time  12    Period  Weeks    Status  On-going      PT LONG TERM GOAL #2   Title  The patient will improve AROM R shoulder to 45 degrees of flexion and scaption in sitting.    Baseline  Rt flex-105 deg, Rt scap- 110 deg in seated (09/07/19)    Time  12    Period  Weeks    Status  Achieved      PT LONG TERM GOAL #3   Title  The patient will be able to reach to the L shoulder for bathing/ADLs.    Time  12    Period  Weeks    Status  On-going      PT  LONG TERM GOAL #4   Title  The patient will be able to reach to R ear and face for shaving and hygeine.    Baseline  can reach but Lt arm must assist during active motion (09/07/19)    Time  12    Period  Weeks    Status  Partially Met      PT LONG TERM GOAL #5   Title  The patient will be able to reach to R side back pocket to demonstrate improved function R shoulder.    Time  12    Period  Weeks    Status  On-going            Plan - 09/20/19 1325    Clinical Impression Statement  Pt's Rt shoulder flexion and abduction ROM is progressing well. Pt reporting he is able to complete more functional tasks (ie:  bathing and dressing) with less assistance from his LUE.  Worked on good scapular positioning with AROM.  Pt demonstrates limited active Rt shoulder ER; fatigued quickly with sidelying ER (without wt).  Pt required min-mod A to return to sitting from supine; pt reported at end of session that he had fallen in bathroom last week and bruised Lt ribs making it painful to push up with LUE.  Pt progressing well towards remaining goals and will benefit from continued PT intervention to maximize safety and return to prior level of function.    Rehab Potential  Good    PT Frequency  2x / week    PT Duration  12 weeks    PT Treatment/Interventions  ADLs/Self Care Home Management;Electrical Stimulation;Moist Heat;Cryotherapy;Patient/family education;Taping;Dry needling;Manual techniques;Therapeutic exercise;Therapeutic activities;Passive range of motion    PT Next Visit Plan  progress per rehab protocol.  encouraged continued compliance with precautions    PT Home Exercise Plan  [place]; written program see Instructions    Consulted and Agree with Plan of Care  Patient       Patient will benefit from skilled therapeutic intervention in order to improve the following deficits and impairments:  Decreased range of motion, Pain, Hypomobility, Postural dysfunction, Decreased strength, Increased edema, Impaired tone, Impaired flexibility  Visit Diagnosis: Acute pain of right shoulder  Muscle weakness (generalized)  Abnormal posture     Problem List Patient Active Problem List   Diagnosis Date Noted  . Unintentional weight loss 08/24/2019  . History of CVA (cerebrovascular accident) 06/25/2019  . Bruit 06/25/2019  . Dizziness 06/14/2019  . Essential hypertension 06/14/2019  . Injury of left shoulder 05/30/2019  . Rotator cuff tear, right 05/30/2019  . Loose stools 02/04/2019  . Gait disturbance 02/04/2019  . Cervical spondylosis 01/17/2019  . Normocytic anemia 01/16/2019  . Trigger finger, right  ring finger 10/27/2018  . Renal insufficiency 06/29/2018  . At moderate risk for fall 06/29/2018  . Ambulates with cane 06/29/2018  . Memory difficulties 06/02/2018  . Diabetic autonomic neuropathy associated with type 2 diabetes mellitus ([place]) 06/02/2018  . Status post lumbar spinal fusion 06/02/2018  . S/P CABG x 5 06/02/2018  . Chronic obstructive pulmonary disease ([place]) 06/02/2018  . Shortness of breath 06/02/2018  . Diabetic nephropathy associated with type 2 diabetes mellitus ([place]) 06/02/2018  . Type 2 diabetes mellitus with diabetic neuropathy, unspecified ([place]) 06/02/2018  . Tachycardia with heart rate 100-120 beats per minute 06/02/2018  . Acromioclavicular arthrosis 06/02/2018  . Diabetic retinopathy ([place])   . Coronary artery disease   . Carotid arterial disease ([place])    Anderson Malta  [place], PTA 09/20/19 3:55 PM  [place], [place], [address] Phone: [phone]   Fax:  [phone]  Name: Kelly Williams MRN: [id number] Date of Birth: 30-Mar-1941

## 2019-09-21 NOTE — Addendum Note (Signed)
Addended by: Rudell Cobb M on: 09/21/2019 01:51 PM   Modules accepted: Orders

## 2019-09-24 ENCOUNTER — Encounter: Payer: Medicare HMO | Admitting: Physical Therapy

## 2019-09-25 ENCOUNTER — Encounter: Payer: Medicare HMO | Admitting: Physical Therapy

## 2019-09-25 DIAGNOSIS — M19011 Primary osteoarthritis, right shoulder: Secondary | ICD-10-CM | POA: Diagnosis not present

## 2019-09-27 ENCOUNTER — Encounter: Payer: Medicare HMO | Admitting: Physical Therapy

## 2019-10-02 ENCOUNTER — Encounter: Payer: Medicare HMO | Admitting: Rehabilitative and Restorative Service Providers"

## 2019-10-04 ENCOUNTER — Encounter: Payer: Self-pay | Admitting: Physical Therapy

## 2019-10-04 ENCOUNTER — Other Ambulatory Visit: Payer: Self-pay

## 2019-10-04 ENCOUNTER — Ambulatory Visit (INDEPENDENT_AMBULATORY_CARE_PROVIDER_SITE_OTHER): Payer: Medicare HMO | Admitting: Physical Therapy

## 2019-10-04 DIAGNOSIS — M6281 Muscle weakness (generalized): Secondary | ICD-10-CM

## 2019-10-04 DIAGNOSIS — M25511 Pain in right shoulder: Secondary | ICD-10-CM

## 2019-10-04 DIAGNOSIS — R293 Abnormal posture: Secondary | ICD-10-CM | POA: Diagnosis not present

## 2019-10-04 NOTE — Therapy (Signed)
[place], [place], [address] Phone: [phone]   Fax:  [phone]  Physical Therapy Treatment  Patient Details  Name: Kelly Williams MRN: [id number] Date of Birth: 1940/11/16 Referring Provider (PT): Ophelia Charter, MD   Encounter Date: 10/04/2019  PT End of Session - 10/04/19 1111    Visit Number  13    Number of Visits  24    Date for PT Re-Evaluation  10/25/19    Authorization Type  [employer]are    PT Start Time  1108    PT Stop Time  1143    PT Time Calculation (min)  35 min    Activity Tolerance  Patient tolerated treatment well;No increased pain    Behavior During Therapy  WFL for tasks assessed/performed       Past Medical History:  Diagnosis Date  . Alcohol abuse   . Alcohol abuse   . Arthritis   . Carotid arterial disease ([place])   . Chronic kidney disease    CKD-patient denies  . COPD (chronic obstructive pulmonary disease) (HCC)    uses inhalers  . Coronary artery disease   . Diabetes mellitus without complication ([place])   . Diabetic retinopathy ([place])   . Hyperlipidemia   . Hypertension   . Normocytic anemia 01/16/2019  . Stroke [facility])    around 2020    Past Surgical History:  Procedure Laterality Date  . APPENDECTOMY     as teenager  . BACK SURGERY    . CAROTID ENDARTERECTOMY Right   . CORONARY ARTERY BYPASS GRAFT     10-12 years ago in [place] at [place] Right 07/18/2019   Procedure: REVERSE SHOULDER ARTHROPLASTY WITH SUBSCAPULAR REPAIR;  Surgeon: Hiram Gash, MD;  Location: [facility];  Service: Orthopedics;  Laterality: Right;  [name] VASECTOMY      There were no vitals filed for this visit.  Subjective Assessment - 10/04/19 1111    Subjective  Pt reports he has been doing exercises with soup can over last 2 wks.  He had follow up with surgeon, "He says I'm doing good and can put my hand behind by back now". He states his Rt arm  feels weak, so his Lt hand assists with shaving and combing hair.    Pertinent History  h/o RTC repair, diabetic neuropathy,  CAD, COPD, HTN, DM type 2, diabetic retinopathy, stroke in 2020, and CABG x 5    Patient Stated Goals  Hope to be able to brush my teeth and comb my hair    Currently in Pain?  No/denies    Pain Score  0-No pain         [facility] PT Assessment - 10/04/19 0001      Assessment   Medical Diagnosis  R reverse total shoulder repair    Referring Provider (PT)  Ophelia Charter, MD    Onset Date/Surgical Date  07/18/19    Hand Dominance  Right    Next MD Visit  03/2020    Prior Therapy  known to our clinic from prior therapy      AROM   Right/Left Shoulder  Right    Right Shoulder Flexion  130 Degrees    Right Shoulder ABduction  115 Degrees    Right Shoulder External Rotation  50 Degrees   arm abdc to 80 deg      [facility] Adult PT Treatment/Exercise - 10/04/19 0001  Shoulder Exercises: Supine   Other Supine Exercises  Rt shoulder flex/ext in supine with 1# wt x 10 reps; forward Rt punch with 2# x 10       Shoulder Exercises: Seated   Row  Both;10 reps;Strengthening;Theraband    Theraband Level (Shoulder Row)  Level 1 (Yellow)   2 sets; cues for form   External Rotation  Strengthening;Both;10 reps    Theraband Level (Shoulder External Rotation)  Level 1 (Yellow)    External Rotation Limitations  limited ROM    Flexion  Right;10 reps;Strengthening   to 90 deg with cues to not elevate scapula   Flexion Weight (lbs)  1,2    Abduction  Right;10 reps;Strengthening   just above 90 deg with cues to not elevate scapula   ABduction Weight (lbs)  1    Other Seated Exercises  shoulder rolls x 10 backwards.       Shoulder Exercises: Sidelying   External Rotation  Right;10 reps;AROM;Strengthening;Weights   2 sets, 1 with weight, 1 without   External Rotation Weight (lbs)  1      Shoulder Exercises: Pulleys   Flexion  2 minutes    Scaption  2 minutes      Shoulder  Exercises: Stretch   Internal Rotation Stretch  5 reps   10 sec, Lt hand assist Rt hand behind back   Other Shoulder Stretches  midlevel doorway stretch with cues for form and not to stretch too far.  X 10 sec x 3 reps              PT Short Term Goals - 09/07/19 1234      PT SHORT TERM GOAL #1   Title  The patient will be indep with initial HEP (per protocol).    Time  6    Period  Weeks    Status  On-going    Target Date  09/13/19      PT SHORT TERM GOAL #2   Title  The patient will improve PROM to 120 degrees for flexion and scaption.    Baseline  90 deg flexion    Time  6    Period  Weeks    Status  Achieved      PT SHORT TERM GOAL #3   Title  The patient will improve PROM ER to 40 degrees    Baseline  20 deg    Time  6    Period  Weeks    Status  Achieved    Target Date  09/13/19      PT SHORT TERM GOAL #4   Title  The patient will tolerate isometric deltoid and periscapular AROM.    Baseline  09/07/19 no pain    Time  6    Period  Weeks    Status  Achieved    Target Date  09/13/19        PT Long Term Goals - 10/04/19 1313      PT LONG TERM GOAL #1   Title  The patient will be indep with progession of HEP    Time  12    Period  Weeks    Status  On-going      PT LONG TERM GOAL #2   Title  The patient will improve AROM R shoulder to 45 degrees of flexion and scaption in sitting.    Time  12    Period  Weeks    Status  Achieved      PT  LONG TERM GOAL #3   Title  The patient will be able to reach to the L shoulder for bathing/ADLs.    Time  12    Period  Weeks    Status  Partially Met      PT LONG TERM GOAL #4   Title  The patient will be able to reach to R ear and face for shaving and hygeine.    Baseline  can reach but Lt arm must assist during active motion    Time  12    Period  Weeks    Status  Partially Met      PT LONG TERM GOAL #5   Title  The patient will be able to reach to R side back pocket to demonstrate improved function R  shoulder.    Time  12    Period  Weeks    Status  Partially Met            Plan - 10/04/19 1314    Clinical Impression Statement  Pt demo improved Rt shoulder ROM.  Pt tolerated Rt shoulder exercises well, requiring minor cues for form and to avoid compensatory motions.  He completed all exercises without increase in pain.  Pt has partially met his goals and will benefit from continued PT intervention to maximize functional mobility independence.    Rehab Potential  Good    PT Frequency  2x / week    PT Duration  12 weeks    PT Treatment/Interventions  ADLs/Self Care Home Management;Electrical Stimulation;Moist Heat;Cryotherapy;Patient/family education;Taping;Dry needling;Manual techniques;Therapeutic exercise;Therapeutic activities;Passive range of motion    PT Next Visit Plan  progress per rehab protocol.    PT Home Exercise Plan  [place]; written program see Instructions    Consulted and Agree with Plan of Care  Patient       Patient will benefit from skilled therapeutic intervention in order to improve the following deficits and impairments:  Decreased range of motion, Pain, Hypomobility, Postural dysfunction, Decreased strength, Increased edema, Impaired tone, Impaired flexibility  Visit Diagnosis: Muscle weakness (generalized)  Abnormal posture  Acute pain of right shoulder     Problem List Patient Active Problem List   Diagnosis Date Noted  . Unintentional weight loss 08/24/2019  . History of CVA (cerebrovascular accident) 06/25/2019  . Bruit 06/25/2019  . Dizziness 06/14/2019  . Essential hypertension 06/14/2019  . Injury of left shoulder 05/30/2019  . Rotator cuff tear, right 05/30/2019  . Loose stools 02/04/2019  . Gait disturbance 02/04/2019  . Cervical spondylosis 01/17/2019  . Normocytic anemia 01/16/2019  . Trigger finger, right ring finger 10/27/2018  . Renal insufficiency 06/29/2018  . At moderate risk for fall 06/29/2018  . Ambulates with cane  06/29/2018  . Memory difficulties 06/02/2018  . Diabetic autonomic neuropathy associated with type 2 diabetes mellitus ([place]) 06/02/2018  . Status post lumbar spinal fusion 06/02/2018  . S/P CABG x 5 06/02/2018  . Chronic obstructive pulmonary disease ([place]) 06/02/2018  . Shortness of breath 06/02/2018  . Diabetic nephropathy associated with type 2 diabetes mellitus ([place]) 06/02/2018  . Type 2 diabetes mellitus with diabetic neuropathy, unspecified ([place]) 06/02/2018  . Tachycardia with heart rate 100-120 beats per minute 06/02/2018  . Acromioclavicular arthrosis 06/02/2018  . Diabetic retinopathy ([place])   . Coronary artery disease   . Carotid arterial disease [facility])    Kerin Perna, PTA 10/04/19 1:20 PM  [facility] [place], [place], [address] Phone: [phone]  Fax:  [phone]  Name: Kelly Williams MRN: [id number] Date of Birth: 1940-07-20

## 2019-10-04 NOTE — Patient Instructions (Signed)
Internal Rotation (Passive)    Bring hand behind back, using other hand to assist. Hold __10__ seconds. Repeat __3__ times. Do _1-2___ sessions per day.

## 2019-10-09 ENCOUNTER — Encounter: Payer: Self-pay | Admitting: Rehabilitative and Restorative Service Providers"

## 2019-10-09 ENCOUNTER — Ambulatory Visit (INDEPENDENT_AMBULATORY_CARE_PROVIDER_SITE_OTHER): Payer: Medicare HMO | Admitting: Rehabilitative and Restorative Service Providers"

## 2019-10-09 ENCOUNTER — Other Ambulatory Visit: Payer: Self-pay

## 2019-10-09 DIAGNOSIS — R293 Abnormal posture: Secondary | ICD-10-CM | POA: Diagnosis not present

## 2019-10-09 DIAGNOSIS — M25511 Pain in right shoulder: Secondary | ICD-10-CM

## 2019-10-09 DIAGNOSIS — M6281 Muscle weakness (generalized): Secondary | ICD-10-CM | POA: Diagnosis not present

## 2019-10-09 NOTE — Therapy (Signed)
[place], [place], [address] Phone: [phone]   Fax:  [phone]  Physical Therapy Treatment  Patient Details  Name: Kelly Williams MRN: [id number] Date of Birth: [birth year] Referring Provider (PT): Ophelia Charter, MD   Encounter Date: 10/09/2019  PT End of Session - 10/09/19 1109    Visit Number  14    Number of Visits  24    Date for PT Re-Evaluation  10/25/19    PT Start Time  1104    PT Stop Time  1140    PT Time Calculation (min)  36 min    Activity Tolerance  Patient tolerated treatment well;No increased pain       Past Medical History:  Diagnosis Date  . Alcohol abuse   . Alcohol abuse   . Arthritis   . Carotid arterial disease ([place])   . Chronic kidney disease    CKD-patient denies  . COPD (chronic obstructive pulmonary disease) (H[facility])    uses inhalers  . Coronary artery disease   . Diabetes mellitus without complication ([place])   . Diabetic retinopathy ([place])   . Hyperlipidemia   . Hypertension   . Normocytic anemia 01/16/2019  . Stroke [facility])    around 2020    Past Surgical History:  Procedure Laterality Date  . APPENDECTOMY     as teenager  . BACK SURGERY    . CAROTID ENDARTERECTOMY Right   . CORONARY ARTERY BYPASS GRAFT     10-12 years ago in [place] at [place] Right 07/18/2019   Procedure: REVERSE SHOULDER ARTHROPLASTY WITH SUBSCAPULAR REPAIR;  Surgeon: Hiram Gash, MD;  Location: [facility];  Service: Orthopedics;  Laterality: Right;  [name] VASECTOMY      There were no vitals filed for this visit.  Subjective Assessment - 10/09/19 1109    Subjective  Feeling ok. Working on his exercises at home. Using right arm for more at home - no pain    Currently in Pain?  No/denies                        [facility] Adult PT Treatment/Exercise - 10/09/19 0001      Shoulder Exercises: Seated   Extension  Strengthening;Right;10  reps;Theraband    Theraband Level (Shoulder Extension)  Level 1 (Yellow)    Row  Both;10 reps;Strengthening;Theraband    Theraband Level (Shoulder Row)  Level 1 (Yellow)   2 sets; cues for form   External Rotation  Strengthening;Both;10 reps    Theraband Level (Shoulder External Rotation)  Level 1 (Yellow)    External Rotation Limitations  limited ROM; repeated isometric hold Rt and pull with Lt     Flexion  Right;10 reps;Strengthening   to 90 deg with cues to not elevate scapula   Theraband Level (Shoulder Flexion)  Level 1 (Yellow)   standing on TB x 10    Flexion Weight (lbs)  2   to 90 deg x 10   Other Seated Exercises  shoulder rolls x 10 backwards.     Other Seated Exercises  elbow extension with UE at side yellow TB x 10 reps       Shoulder Exercises: Standing   Other Standing Exercises  gentle stretch for ER with Rt UE resting on corner of pillar - gentle rotation 20 sec hold x 5 reps       Shoulder Exercises: Pulleys   Flexion  2 minutes    Scaption  2 minutes      Shoulder Exercises: Therapy Ball   Right/Left Limitations  isometric adduction with small orange ball x 10 2-3 sec hold     Other Therapy Ball Exercises  working on bilat UE function with hands on ball and rolled towel - elbow flex/ext; pressing ball out and pulling back; supination and pronation with ball UE's slightly extended in front; diagonaly shoulder to opposite knee both directions    Other Therapy Ball Exercises  scapular depression pushing into large red ball 5 sec hold x 10 reps       Shoulder Exercises: ROM/Strengthening   "W" Arms  x 15-20 reps                PT Short Term Goals - 09/07/19 1234      PT SHORT TERM GOAL #1   Title  The patient will be indep with initial HEP (per protocol).    Time  6    Period  Weeks    Status  On-going    Target Date  09/13/19      PT SHORT TERM GOAL #2   Title  The patient will improve PROM to 120 degrees for flexion and scaption.    Baseline  90  deg flexion    Time  6    Period  Weeks    Status  Achieved      PT SHORT TERM GOAL #3   Title  The patient will improve PROM ER to 40 degrees    Baseline  20 deg    Time  6    Period  Weeks    Status  Achieved    Target Date  09/13/19      PT SHORT TERM GOAL #4   Title  The patient will tolerate isometric deltoid and periscapular AROM.    Baseline  09/07/19 no pain    Time  6    Period  Weeks    Status  Achieved    Target Date  09/13/19        PT Long Term Goals - 10/04/19 1313      PT LONG TERM GOAL #1   Title  The patient will be indep with progession of HEP    Time  12    Period  Weeks    Status  On-going      PT LONG TERM GOAL #2   Title  The patient will improve AROM R shoulder to 45 degrees of flexion and scaption in sitting.    Time  12    Period  Weeks    Status  Achieved      PT LONG TERM GOAL #3   Title  The patient will be able to reach to the L shoulder for bathing/ADLs.    Time  12    Period  Weeks    Status  Partially Met      PT LONG TERM GOAL #4   Title  The patient will be able to reach to R ear and face for shaving and hygeine.    Baseline  can reach but Lt arm must assist during active motion    Time  12    Period  Weeks    Status  Partially Met      PT LONG TERM GOAL #5   Title  The patient will be able to reach to R side back pocket to demonstrate improved function R shoulder.  Time  12    Period  Weeks    Status  Partially Met            Plan - 10/09/19 1144    Clinical Impression Statement  cotninues to work on ROM and strengthening.Patient continues to demonstrate elevation of Rt shoulder girdle with shoulder flexion and scaption. Added gentle ER stretching. Cotninued to work on stabilization and strengthening.    Rehab Potential  Good    PT Frequency  2x / week    PT Duration  12 weeks    PT Treatment/Interventions  ADLs/Self Care Home Management;Electrical Stimulation;Moist Heat;Cryotherapy;Patient/family  education;Taping;Dry needling;Manual techniques;Therapeutic exercise;Therapeutic activities;Passive range of motion    PT Next Visit Plan  progress per rehab protocol.    PT Home Exercise Plan  [facility]; written program       Patient will benefit from skilled therapeutic intervention in order to improve the following deficits and impairments:     Visit Diagnosis: Muscle weakness (generalized)  Abnormal posture  Acute pain of right shoulder     Problem List Patient Active Problem List   Diagnosis Date Noted  . Unintentional weight loss 08/24/2019  . History of CVA (cerebrovascular accident) 06/25/2019  . Bruit 06/25/2019  . Dizziness 06/14/2019  . Essential hypertension 06/14/2019  . Injury of left shoulder 05/30/2019  . Rotator cuff tear, right 05/30/2019  . Loose stools 02/04/2019  . Gait disturbance 02/04/2019  . Cervical spondylosis 01/17/2019  . Normocytic anemia 01/16/2019  . Trigger finger, right ring finger 10/27/2018  . Renal insufficiency 06/29/2018  . At moderate risk for fall 06/29/2018  . Ambulates with cane 06/29/2018  . Memory difficulties 06/02/2018  . Diabetic autonomic neuropathy associated with type 2 diabetes mellitus ([place]) 06/02/2018  . Status post lumbar spinal fusion 06/02/2018  . S/P CABG x 5 06/02/2018  . Chronic obstructive pulmonary disease ([place]) 06/02/2018  . Shortness of breath 06/02/2018  . Diabetic nephropathy associated with type 2 diabetes mellitus ([place]) 06/02/2018  . Type 2 diabetes mellitus with diabetic neuropathy, unspecified ([place]) 06/02/2018  . Tachycardia with heart rate 100-120 beats per minute 06/02/2018  . Acromioclavicular arthrosis 06/02/2018  . Diabetic retinopathy ([place])   . Coronary artery disease   . Carotid arterial disease ([place])     Nickie Deren Nilda Simmer PT, MPH  10/09/2019, 11:46 AM  [facility] [place], [place], [address] Phone: [phone]   Fax:   [phone]  Name: Kelly Williams MRN: [id number] Date of Birth: 03/31/1941

## 2019-10-11 ENCOUNTER — Encounter: Payer: Medicare HMO | Admitting: Rehabilitative and Restorative Service Providers"

## 2019-10-16 ENCOUNTER — Ambulatory Visit (INDEPENDENT_AMBULATORY_CARE_PROVIDER_SITE_OTHER): Payer: Medicare HMO | Admitting: Physical Therapy

## 2019-10-16 ENCOUNTER — Other Ambulatory Visit: Payer: Self-pay

## 2019-10-16 DIAGNOSIS — R293 Abnormal posture: Secondary | ICD-10-CM

## 2019-10-16 DIAGNOSIS — M6281 Muscle weakness (generalized): Secondary | ICD-10-CM | POA: Diagnosis not present

## 2019-10-16 DIAGNOSIS — M25511 Pain in right shoulder: Secondary | ICD-10-CM | POA: Diagnosis not present

## 2019-10-16 NOTE — Therapy (Signed)
[place], [place], [address] Phone: [phone]   Fax:  [phone]  Physical Therapy Treatment  Patient Details  Name: Kelly Williams MRN: [id number] Date of Birth: 23-Apr-1941 Referring Provider (PT): Ophelia Charter, MD   Encounter Date: 10/16/2019   PT End of Session - 10/16/19 1106    Visit Number 15    Number of Visits 24    Date for PT Re-Evaluation 10/25/19    Authorization Type humana medicare    PT Start Time 1104    PT Stop Time 1144    PT Time Calculation (min) 40 min    Activity Tolerance Patient tolerated treatment well;No increased pain           Past Medical History:  Diagnosis Date  . Alcohol abuse   . Alcohol abuse   . Arthritis   . Carotid arterial disease ([place])   . Chronic kidney disease    CKD-patient denies  . COPD (chronic obstructive pulmonary disease) (HCC)    uses inhalers  . Coronary artery disease   . Diabetes mellitus without complication ([place])   . Diabetic retinopathy ([place])   . Hyperlipidemia   . Hypertension   . Normocytic anemia 01/16/2019  . Stroke [facility])    around 2020    Past Surgical History:  Procedure Laterality Date  . APPENDECTOMY     as teenager  . BACK SURGERY    . CAROTID ENDARTERECTOMY Right   . CORONARY ARTERY BYPASS GRAFT     10-12 years ago in [place] at [place] Right 07/18/2019   Procedure: REVERSE SHOULDER ARTHROPLASTY WITH SUBSCAPULAR REPAIR;  Surgeon: Hiram Gash, MD;  Location: [facility];  Service: Orthopedics;  Laterality: Right;  [name] VASECTOMY      There were no vitals filed for this visit.   Subjective Assessment - 10/16/19 1106    Subjective Pt reports he is feeling good.  He continues to use his Lt arm to help Rt with shaving/ combing hair.  No pain.    Pertinent History h/o RTC repair, diabetic neuropathy,  CAD, COPD, HTN, DM type 2, diabetic retinopathy, stroke in 2020, and CABG x 5     Patient Stated Goals Hope to be able to brush my teeth and comb my hair    Currently in Pain? No/denies    Pain Score 0-No pain              [facility] PT Assessment - 10/16/19 0001      Assessment   Medical Diagnosis R reverse total shoulder repair    Referring Provider (PT) Ophelia Charter, MD    Onset Date/Surgical Date 07/18/19    Hand Dominance Right    Next MD Visit 03/2020    Prior Therapy known to our clinic from prior therapy      AROM   Right Shoulder External Rotation 53 Degrees   seated, arm abdct 80 deg           [facility] Adult PT Treatment/Exercise - 10/16/19 0001      Elbow Exercises   Elbow Flexion Strengthening;Right;10 reps    Theraband Level (Elbow Flexion) Level 2 (Red)      Neck Exercises: Seated   Shoulder Rolls Backwards;Forwards;5 reps      Shoulder Exercises: Seated   Extension Strengthening;Right;10 reps    Theraband Level (Shoulder Extension) Level 1 (Yellow)    Row Right;10 reps    Theraband Level (Shoulder  Row) Level 2 (Red)   2 sets   External Rotation Strengthening;Both;10 reps    Theraband Level (Shoulder External Rotation) Level 1 (Yellow)    External Rotation Limitations limited ROM; repeated isometric hold Rt and pull with Lt     Other Seated Exercises Rt shoulder flexion to touch top of head, then reaching to ceiliing and slowly lowering to side x 5 reps; then Rt shoulder flexion (above eye level) to grab 1# and slowly lower to side x 5 reps      Shoulder Exercises: ROM/Strengthening   UBE (Upper Arm Bike) L1: (seated) 1.5 min forward     Other ROM/Strengthening Exercises PNF D1, D2 with RUE with resistance from PTA x 10 in each direction.      Other ROM/Strengthening Exercises Rt shoulder ER isometric with back of palm by head x 5 sec x 10 reps;  then AROM with Rt arm at side of body to top of head.       Shoulder Exercises: Stretch   Internal Rotation Stretch 2 reps   10 sec hold   Other Shoulder Stretches midlevel doorway stretch x 15  sec x 3 reps      Manual Therapy   Manual therapy comments pt in supported supine    Soft tissue mobilization working through [employer] and shoulder girdle.                     PT Short Term Goals - 09/07/19 1234      PT SHORT TERM GOAL #1   Title The patient will be indep with initial HEP (per protocol).    Time 6    Period Weeks    Status On-going    Target Date 09/13/19      PT SHORT TERM GOAL #2   Title The patient will improve PROM to 120 degrees for flexion and scaption.    Baseline 90 deg flexion    Time 6    Period Weeks    Status Achieved      PT SHORT TERM GOAL #3   Title The patient will improve PROM ER to 40 degrees    Baseline 20 deg    Time 6    Period Weeks    Status Achieved    Target Date 09/13/19      PT SHORT TERM GOAL #4   Title The patient will tolerate isometric deltoid and periscapular AROM.    Baseline 09/07/19 no pain    Time 6    Period Weeks    Status Achieved    Target Date 09/13/19             PT Long Term Goals - 10/16/19 1123      PT LONG TERM GOAL #1   Title The patient will be indep with progession of HEP    Time 12    Period Weeks    Status On-going      PT LONG TERM GOAL #2   Title The patient will improve AROM R shoulder to 45 degrees of flexion and scaption in sitting.    Time 12    Period Weeks    Status Achieved      PT LONG TERM GOAL #3   Title The patient will be able to reach to the L shoulder for bathing/ADLs.    Time 12    Period Weeks    Status Achieved      PT LONG TERM GOAL #4   Title  The patient will be able to reach to R ear and face for shaving and hygeine.    Time 12    Period Weeks    Status Achieved      PT LONG TERM GOAL #5   Title The patient will be able to reach to R side back pocket to demonstrate improved function R shoulder.    Baseline Lt hand to assist RUE    Time 12    Period Weeks    Status Achieved                 Plan - 10/16/19 1305    Clinical Impression  Statement Pt now able to reach in his back pocket and top of head without difficulty.  He continues to use his LUE to support Rt for shaving/hair combing likely due to weakness; he was able to perform these motions with good form without use of LUE or pain.  Pt tolerated all exercises well without pain. His LEs fatigue quickly, so most exercises are performed in sitting.   Weakness noted in Rt shoulder ER; will benefit from continued strengthening in this area.  Pt has partially met his goals and will benefit from continued PT intervention to max mobility.    Rehab Potential Good    PT Frequency 2x / week    PT Duration 12 weeks    PT Treatment/Interventions ADLs/Self Care Home Management;Electrical Stimulation;Moist Heat;Cryotherapy;Patient/family education;Taping;Dry needling;Manual techniques;Therapeutic exercise;Therapeutic activities;Passive range of motion    PT Next Visit Plan progress strengthening per rehab protocol.  assess goals for recert vs d/c.    PT Home Exercise Plan [place]; written program    Consulted and Agree with Plan of Care Patient           Patient will benefit from skilled therapeutic intervention in order to improve the following deficits and impairments:  Decreased range of motion, Pain, Hypomobility, Postural dysfunction, Decreased strength, Increased edema, Impaired tone, Impaired flexibility  Visit Diagnosis: Muscle weakness (generalized)  Abnormal posture  Acute pain of right shoulder     Problem List Patient Active Problem List   Diagnosis Date Noted  . Unintentional weight loss 08/24/2019  . History of CVA (cerebrovascular accident) 06/25/2019  . Bruit 06/25/2019  . Dizziness 06/14/2019  . Essential hypertension 06/14/2019  . Injury of left shoulder 05/30/2019  . Rotator cuff tear, right 05/30/2019  . Loose stools 02/04/2019  . Gait disturbance 02/04/2019  . Cervical spondylosis 01/17/2019  . Normocytic anemia 01/16/2019  . Trigger finger,  right ring finger 10/27/2018  . Renal insufficiency 06/29/2018  . At moderate risk for fall 06/29/2018  . Ambulates with cane 06/29/2018  . Memory difficulties 06/02/2018  . Diabetic autonomic neuropathy associated with type 2 diabetes mellitus ([place]) 06/02/2018  . Status post lumbar spinal fusion 06/02/2018  . S/P CABG x 5 06/02/2018  . Chronic obstructive pulmonary disease ([place]) 06/02/2018  . Shortness of breath 06/02/2018  . Diabetic nephropathy associated with type 2 diabetes mellitus ([place]) 06/02/2018  . Type 2 diabetes mellitus with diabetic neuropathy, unspecified ([place]) 06/02/2018  . Tachycardia with heart rate 100-120 beats per minute 06/02/2018  . Acromioclavicular arthrosis 06/02/2018  . Diabetic retinopathy ([place])   . Coronary artery disease   . Carotid arterial disease [facility])    Kerin Perna, PTA 10/16/19 1:13 PM  [facility] [place], [place], [address] Phone: [phone]   Fax:  [phone]  Name: BRONISLAUS VERDELL MRN: [id number] Date of  Birth: [birth year]

## 2019-10-18 ENCOUNTER — Encounter: Payer: Medicare HMO | Admitting: Rehabilitative and Restorative Service Providers"

## 2019-10-25 ENCOUNTER — Other Ambulatory Visit: Payer: Self-pay

## 2019-10-25 ENCOUNTER — Ambulatory Visit (INDEPENDENT_AMBULATORY_CARE_PROVIDER_SITE_OTHER): Payer: Medicare HMO | Admitting: Physical Therapy

## 2019-10-25 DIAGNOSIS — M6281 Muscle weakness (generalized): Secondary | ICD-10-CM | POA: Diagnosis not present

## 2019-10-25 DIAGNOSIS — R293 Abnormal posture: Secondary | ICD-10-CM

## 2019-10-25 NOTE — Therapy (Addendum)
[place], [place], [address] Phone: [phone]   Fax:  [phone]  Physical Therapy Treatment  Patient Details  Name: Kelly Williams MRN: [id number] Date of Birth: 23-Oct-1940 Referring Provider (PT): Ophelia Charter, MD   Encounter Date: 10/25/2019   PT End of Session - 10/25/19 1029    Visit Number 16    Number of Visits 24    Date for PT Re-Evaluation 10/25/19    Authorization Type humana medicare    PT Start Time 1019    PT Stop Time 1048    PT Time Calculation (min) 29 min    Activity Tolerance Patient tolerated treatment well;No increased pain           Past Medical History:  Diagnosis Date  . Alcohol abuse   . Alcohol abuse   . Arthritis   . Carotid arterial disease ([place])   . Chronic kidney disease    CKD-patient denies  . COPD (chronic obstructive pulmonary disease) (HCC)    uses inhalers  . Coronary artery disease   . Diabetes mellitus without complication ([place])   . Diabetic retinopathy ([place])   . Hyperlipidemia   . Hypertension   . Normocytic anemia 01/16/2019  . Stroke [facility])    around 2020    Past Surgical History:  Procedure Laterality Date  . APPENDECTOMY     as teenager  . BACK SURGERY    . CAROTID ENDARTERECTOMY Right   . CORONARY ARTERY BYPASS GRAFT     10-12 years ago in [place] at [place] Right 07/18/2019   Procedure: REVERSE SHOULDER ARTHROPLASTY WITH SUBSCAPULAR REPAIR;  Surgeon: Hiram Gash, MD;  Location: [facility];  Service: Orthopedics;  Laterality: Right;  [name] VASECTOMY      There were no vitals filed for this visit.   Subjective Assessment - 10/25/19 1022    Subjective Pt reports he is doing his exercises daily.  He can comb his hair with Rt arm without difficulty. His Rt arm gets tired when shaving, so he uses his LUE to finish.  "I'm pretty much pain free".  Only complaint is he can't sleep in his Rt side. Pt  states, "I think I've reached the end of the road here".  Pt verbalized readiness to work on HEP on own and d/c.    Pertinent History h/o RTC repair, diabetic neuropathy,  CAD, COPD, HTN, DM type 2, diabetic retinopathy, stroke in 2020, and CABG x 5    Currently in Pain? No/denies    Pain Score 0-No pain              [facility] PT Assessment - 10/25/19 0001      Assessment   Medical Diagnosis R reverse total shoulder repair    Referring Provider (PT) Ophelia Charter, MD    Onset Date/Surgical Date 07/18/19    Hand Dominance Right    Next MD Visit 03/2020    Prior Therapy known to our clinic from prior therapy      Strength   Strength Assessment Site Shoulder    Right/Left Shoulder Right    Right Shoulder Flexion 4+/5    Right Shoulder Extension 5/5    Right Shoulder ABduction 4+/5    Right Shoulder Internal Rotation 3+/5    Right Shoulder External Rotation 3+/5            [facility] Adult PT Treatment/Exercise - 10/25/19 0001  Shoulder Exercises: Supine   Horizontal ABduction Strengthening;Both;10 reps;Theraband    Theraband Level (Shoulder Horizontal ABduction) Level 1 (Yellow)   2 sets   Flexion Strengthening;Both;10 reps    Theraband Level (Shoulder Flexion) Level 1 (Yellow)    Diagonals Strengthening;Right;10 reps   2 sets    Theraband Level (Shoulder Diagonals) Level 1 (Yellow)      Shoulder Exercises: Seated   External Rotation Strengthening;Right;10 reps;Theraband   reactive isometric on RUE   Theraband Level (Shoulder External Rotation) Level 1 (Yellow)    Internal Rotation Strengthening;Right;10 reps;Theraband    Theraband Level (Shoulder Internal Rotation) Level 1 (Yellow)    Flexion Right;Left;10 reps    Flexion Weight (lbs) 2    Abduction Strengthening;Right;Left;10 reps    ABduction Weight (lbs) 2    Diagonals Theraband;Right;AROM;5 reps    Other Seated Exercises scap retraction x 5 sec x 5 reps; shoulder rolls x 5 reps;  Rt hand behind the head, 5 reps x 2 sets                    PT Education - 10/25/19 1059    Education Details HEP    Person(s) Educated Patient    Methods Explanation;Demonstration;Handout;Verbal cues;Tactile cues    Comprehension Verbalized understanding;Returned demonstration            PT Short Term Goals - 09/07/19 1234      PT SHORT TERM GOAL #1   Title The patient will be indep with initial HEP (per protocol).    Time 6    Period Weeks    Status Achieved    Target Date 09/13/19      PT SHORT TERM GOAL #2   Title The patient will improve PROM to 120 degrees for flexion and scaption.    Baseline 90 deg flexion    Time 6    Period Weeks    Status Achieved      PT SHORT TERM GOAL #3   Title The patient will improve PROM ER to 40 degrees    Baseline 20 deg    Time 6    Period Weeks    Status Achieved    Target Date 09/13/19      PT SHORT TERM GOAL #4   Title The patient will tolerate isometric deltoid and periscapular AROM.    Baseline 09/07/19 no pain    Time 6    Period Weeks    Status Achieved    Target Date 09/13/19             PT Long Term Goals - 10/25/19 1027      PT LONG TERM GOAL #1   Title The patient will be indep with progession of HEP    Time 12    Period Weeks    Status Achieved      PT LONG TERM GOAL #2   Title The patient will improve AROM R shoulder to 45 degrees of flexion and scaption in sitting.    Time 12    Period Weeks    Status Achieved      PT LONG TERM GOAL #3   Title The patient will be able to reach to the L shoulder for bathing/ADLs.    Time 12    Period Weeks    Status Achieved      PT LONG TERM GOAL #4   Title The patient will be able to reach to R ear and face for shaving and hygeine.  Time 12    Period Weeks    Status Achieved      PT LONG TERM GOAL #5   Title The patient will be able to reach to R side back pocket to demonstrate improved function R shoulder.    Baseline Lt hand to assist RUE    Time 12    Period Weeks    Status  Achieved                 Plan - 10/25/19 1223    Clinical Impression Statement Pt demonstrated improved Rt shoulder strength and AROM.  Pt tolerated all strengthening/ AROM exercises well with only fatigue, no pain.  Pt has met his goals and verbalized readiness to d/c to HEP.    Rehab Potential Good    PT Frequency 2x / week    PT Duration 12 weeks    PT Treatment/Interventions ADLs/Self Care Home Management;Electrical Stimulation;Moist Heat;Cryotherapy;Patient/family education;Taping;Dry needling;Manual techniques;Therapeutic exercise;Therapeutic activities;Passive range of motion    PT Next Visit Plan spoke to supervising PT; will d/c to HEP.    PT Home Exercise Plan [place]; written program    Consulted and Agree with Plan of Care Patient           Patient will benefit from skilled therapeutic intervention in order to improve the following deficits and impairments:  Decreased range of motion, Pain, Hypomobility, Postural dysfunction, Decreased strength, Increased edema, Impaired tone, Impaired flexibility  Visit Diagnosis: Muscle weakness (generalized)  Abnormal posture     Problem List Patient Active Problem List   Diagnosis Date Noted  . Unintentional weight loss 08/24/2019  . History of CVA (cerebrovascular accident) 06/25/2019  . Bruit 06/25/2019  . Dizziness 06/14/2019  . Essential hypertension 06/14/2019  . Injury of left shoulder 05/30/2019  . Rotator cuff tear, right 05/30/2019  . Loose stools 02/04/2019  . Gait disturbance 02/04/2019  . Cervical spondylosis 01/17/2019  . Normocytic anemia 01/16/2019  . Trigger finger, right ring finger 10/27/2018  . Renal insufficiency 06/29/2018  . At moderate risk for fall 06/29/2018  . Ambulates with cane 06/29/2018  . Memory difficulties 06/02/2018  . Diabetic autonomic neuropathy associated with type 2 diabetes mellitus ([place]) 06/02/2018  . Status post lumbar spinal fusion 06/02/2018  . S/P CABG x 5 06/02/2018   . Chronic obstructive pulmonary disease ([place]) 06/02/2018  . Shortness of breath 06/02/2018  . Diabetic nephropathy associated with type 2 diabetes mellitus ([place]) 06/02/2018  . Type 2 diabetes mellitus with diabetic neuropathy, unspecified ([place]) 06/02/2018  . Tachycardia with heart rate 100-120 beats per minute 06/02/2018  . Acromioclavicular arthrosis 06/02/2018  . Diabetic retinopathy ([place])   . Coronary artery disease   . Carotid arterial disease [facility])    Kerin Perna, PTA 10/25/19 12:25 PM  [place], [place], [address] Phone: [phone]   Fax:  [phone]  Name: ARGENIS KUMARI MRN: [id number] Date of Birth: 02-07-1941    PHYSICAL THERAPY DISCHARGE SUMMARY  Visits from Start of Care: 16  Current functional level related to goals / functional outcomes: See progress note for discharge status    Remaining deficits: Needs to continue with HEP and shoulder precautions   Education / Equipment: HEP  Plan: Patient agrees to discharge.  Patient goals were partially met. Patient is being discharged due to being pleased with the current functional level.  ?????     Celyn P. Helene Kelp PT, MPH 11/08/19 10:20 AM

## 2019-10-25 NOTE — Patient Instructions (Signed)
Over Head Pull: Narrow Grip        On back, knees bent, feet flat, band across thighs, elbows straight but relaxed. Pull hands apart (start). Keeping elbows straight, bring arms up and over head, hands toward floor. Keep pull steady on band. Hold momentarily. Return slowly, keeping pull steady, back to start. Repeat __10_ times. Band color __yellow____   Side Pull: Double Arm   On back, knees bent, feet flat. Arms perpendicular to body, shoulder level, elbows straight but relaxed. Pull arms out to sides, elbows straight. Resistance band comes across collarbones, hands toward floor. Hold momentarily. Slowly return to starting position. Repeat __10_ times. Band color __yellow___   Sash   On back, knees bent, feet flat, left hand on left hip, right hand above left. Pull right arm DIAGONALLY (hip to shoulder) across chest. Bring right arm along head toward floor. Hold momentarily. Slowly return to starting position. Repeat _10__ times. Do with left arm. Band color __yellow____   Shoulder Rotation: Double Arm   On back, knees bent, feet flat, elbows tucked at sides, bent 90, hands palms up. Pull hands apart and down toward floor, keeping elbows near sides. Hold momentarily. Slowly return to starting position. Repeat _10__ times. Band color _yellow_____   Shoulder Internal / External Rotation: Combined Motion    Place back of right hand on small of back. Then move hand out and up, placing palm on back of head. Repeat sequence _5-10___ times per session. Do _7___ sessions per week. Position: Standing    Strengthening: Resisted Internal Rotation    Hold tubing in left hand, elbow at side and forearm out. Rotate forearm in across body. Repeat __10__ times per set. Do __2__ sets per session. Do _1___ sessions per day.  http://orth.exer.us/830   Copyright  VHI. All rights reserved.

## 2019-11-01 ENCOUNTER — Telehealth: Payer: Self-pay | Admitting: Physical Therapy

## 2019-11-01 NOTE — Telephone Encounter (Signed)
Patient called stating that his daughter doesn't believe he should be finished with PT.  Spoke with patient regarding his progress since initiating therapy, how he has functional movement in his Right arm, and that as long as he continues his HEP his strength should continue improve.  Reminded patient that he had requested to d/c on last visit because he didn't feel he had any limitations and was pleased with his level of function; patient agreed.  Patient has met all therapy goals to date.  With patient meeting goals and requesting discharge, patient has been discharged from therapy. Patient agreed that he felt like he didn't need additional visits and stated he would speak with his daughter regarding this.  Informed patient that his daughter can call and speak with PT or myself if she had additional concerns.    Kerin Perna, PTA 11/01/19 2:21 PM

## 2019-11-14 ENCOUNTER — Ambulatory Visit: Payer: Medicare HMO | Admitting: Cardiology

## 2019-11-25 ENCOUNTER — Inpatient Hospital Stay (HOSPITAL_COMMUNITY)
Admission: EM | Admit: 2019-11-25 | Discharge: 2019-11-29 | DRG: 065 | Disposition: A | Discharge status: Home Health | Payer: Medicare HMO | Attending: Internal Medicine | Admitting: Internal Medicine

## 2019-11-25 DIAGNOSIS — I6521 Occlusion and stenosis of right carotid artery: Secondary | ICD-10-CM | POA: Diagnosis present

## 2019-11-25 DIAGNOSIS — N1831 Chronic kidney disease, stage 3a: Secondary | ICD-10-CM | POA: Diagnosis present

## 2019-11-25 DIAGNOSIS — E1122 Type 2 diabetes mellitus with diabetic chronic kidney disease: Secondary | ICD-10-CM | POA: Diagnosis present

## 2019-11-25 DIAGNOSIS — I2510 Atherosclerotic heart disease of native coronary artery without angina pectoris: Secondary | ICD-10-CM | POA: Diagnosis present

## 2019-11-25 DIAGNOSIS — K219 Gastro-esophageal reflux disease without esophagitis: Secondary | ICD-10-CM | POA: Diagnosis present

## 2019-11-25 DIAGNOSIS — Z9114 Patient's other noncompliance with medication regimen: Secondary | ICD-10-CM

## 2019-11-25 DIAGNOSIS — E1142 Type 2 diabetes mellitus with diabetic polyneuropathy: Secondary | ICD-10-CM | POA: Diagnosis present

## 2019-11-25 DIAGNOSIS — Z79899 Other long term (current) drug therapy: Secondary | ICD-10-CM

## 2019-11-25 DIAGNOSIS — Z794 Long term (current) use of insulin: Secondary | ICD-10-CM

## 2019-11-25 DIAGNOSIS — R270 Ataxia, unspecified: Secondary | ICD-10-CM | POA: Diagnosis present

## 2019-11-25 DIAGNOSIS — Z7982 Long term (current) use of aspirin: Secondary | ICD-10-CM

## 2019-11-25 DIAGNOSIS — J449 Chronic obstructive pulmonary disease, unspecified: Secondary | ICD-10-CM | POA: Diagnosis present

## 2019-11-25 DIAGNOSIS — Z888 Allergy status to other drugs, medicaments and biological substances status: Secondary | ICD-10-CM

## 2019-11-25 DIAGNOSIS — Z9989 Dependence on other enabling machines and devices: Secondary | ICD-10-CM

## 2019-11-25 DIAGNOSIS — I779 Disorder of arteries and arterioles, unspecified: Secondary | ICD-10-CM | POA: Diagnosis present

## 2019-11-25 DIAGNOSIS — W19XXXA Unspecified fall, initial encounter: Secondary | ICD-10-CM | POA: Diagnosis present

## 2019-11-25 DIAGNOSIS — Z96611 Presence of right artificial shoulder joint: Secondary | ICD-10-CM | POA: Diagnosis present

## 2019-11-25 DIAGNOSIS — F10229 Alcohol dependence with intoxication, unspecified: Secondary | ICD-10-CM | POA: Diagnosis present

## 2019-11-25 DIAGNOSIS — Z951 Presence of aortocoronary bypass graft: Secondary | ICD-10-CM

## 2019-11-25 DIAGNOSIS — Z8249 Family history of ischemic heart disease and other diseases of the circulatory system: Secondary | ICD-10-CM

## 2019-11-25 DIAGNOSIS — N179 Acute kidney failure, unspecified: Secondary | ICD-10-CM | POA: Diagnosis present

## 2019-11-25 DIAGNOSIS — G510 Bell's palsy: Secondary | ICD-10-CM | POA: Diagnosis present

## 2019-11-25 DIAGNOSIS — E1140 Type 2 diabetes mellitus with diabetic neuropathy, unspecified: Secondary | ICD-10-CM | POA: Diagnosis present

## 2019-11-25 DIAGNOSIS — Z87891 Personal history of nicotine dependence: Secondary | ICD-10-CM

## 2019-11-25 DIAGNOSIS — I129 Hypertensive chronic kidney disease with stage 1 through stage 4 chronic kidney disease, or unspecified chronic kidney disease: Secondary | ICD-10-CM | POA: Diagnosis present

## 2019-11-25 DIAGNOSIS — E785 Hyperlipidemia, unspecified: Secondary | ICD-10-CM | POA: Diagnosis present

## 2019-11-25 DIAGNOSIS — D649 Anemia, unspecified: Secondary | ICD-10-CM | POA: Diagnosis present

## 2019-11-25 DIAGNOSIS — D72829 Elevated white blood cell count, unspecified: Secondary | ICD-10-CM | POA: Diagnosis present

## 2019-11-25 DIAGNOSIS — R29706 NIHSS score 6: Secondary | ICD-10-CM | POA: Diagnosis present

## 2019-11-25 DIAGNOSIS — R471 Dysarthria and anarthria: Secondary | ICD-10-CM | POA: Diagnosis present

## 2019-11-25 DIAGNOSIS — E11319 Type 2 diabetes mellitus with unspecified diabetic retinopathy without macular edema: Secondary | ICD-10-CM | POA: Diagnosis present

## 2019-11-25 DIAGNOSIS — I429 Cardiomyopathy, unspecified: Secondary | ICD-10-CM | POA: Diagnosis present

## 2019-11-25 DIAGNOSIS — I720 Aneurysm of carotid artery: Secondary | ICD-10-CM | POA: Diagnosis present

## 2019-11-25 DIAGNOSIS — I10 Essential (primary) hypertension: Secondary | ICD-10-CM | POA: Diagnosis present

## 2019-11-25 DIAGNOSIS — R296 Repeated falls: Secondary | ICD-10-CM | POA: Diagnosis present

## 2019-11-25 DIAGNOSIS — E1151 Type 2 diabetes mellitus with diabetic peripheral angiopathy without gangrene: Secondary | ICD-10-CM | POA: Diagnosis present

## 2019-11-25 DIAGNOSIS — Z8673 Personal history of transient ischemic attack (TIA), and cerebral infarction without residual deficits: Secondary | ICD-10-CM

## 2019-11-25 DIAGNOSIS — Z20822 Contact with and (suspected) exposure to covid-19: Secondary | ICD-10-CM | POA: Diagnosis present

## 2019-11-25 DIAGNOSIS — I639 Cerebral infarction, unspecified: Secondary | ICD-10-CM | POA: Diagnosis not present

## 2019-11-25 DIAGNOSIS — M1990 Unspecified osteoarthritis, unspecified site: Secondary | ICD-10-CM | POA: Diagnosis present

## 2019-11-26 ENCOUNTER — Observation Stay (HOSPITAL_COMMUNITY): Payer: Medicare HMO

## 2019-11-26 ENCOUNTER — Emergency Department (HOSPITAL_COMMUNITY): Payer: Medicare HMO

## 2019-11-26 ENCOUNTER — Encounter (HOSPITAL_COMMUNITY): Payer: Self-pay | Admitting: Emergency Medicine

## 2019-11-26 DIAGNOSIS — I639 Cerebral infarction, unspecified: Principal | ICD-10-CM

## 2019-11-26 DIAGNOSIS — I10 Essential (primary) hypertension: Secondary | ICD-10-CM

## 2019-11-26 DIAGNOSIS — Z951 Presence of aortocoronary bypass graft: Secondary | ICD-10-CM

## 2019-11-26 DIAGNOSIS — Z9989 Dependence on other enabling machines and devices: Secondary | ICD-10-CM | POA: Diagnosis not present

## 2019-11-26 DIAGNOSIS — E1140 Type 2 diabetes mellitus with diabetic neuropathy, unspecified: Secondary | ICD-10-CM

## 2019-11-26 DIAGNOSIS — Z794 Long term (current) use of insulin: Secondary | ICD-10-CM

## 2019-11-26 DIAGNOSIS — J449 Chronic obstructive pulmonary disease, unspecified: Secondary | ICD-10-CM

## 2019-11-26 DIAGNOSIS — I6521 Occlusion and stenosis of right carotid artery: Secondary | ICD-10-CM

## 2019-11-26 DIAGNOSIS — J439 Emphysema, unspecified: Secondary | ICD-10-CM

## 2019-11-26 LAB — COMPREHENSIVE METABOLIC PANEL
ALT: 17 U/L (ref 0–44)
AST: 20 U/L (ref 15–41)
Albumin: 3.8 g/dL (ref 3.5–5.0)
Alkaline Phosphatase: 91 U/L (ref 38–126)
Anion gap: 10 (ref 5–15)
BUN: 39 mg/dL — ABNORMAL HIGH (ref 8–23)
CO2: 21 mmol/L — ABNORMAL LOW (ref 22–32)
Calcium: 9.3 mg/dL (ref 8.9–10.3)
Chloride: 107 mmol/L (ref 98–111)
Creatinine, Ser: 1.79 mg/dL — ABNORMAL HIGH (ref 0.61–1.24)
GFR calc Af Amer: 41 mL/min — ABNORMAL LOW (ref >60)
GFR calc non Af Amer: 35 mL/min — ABNORMAL LOW (ref >60)
Glucose, Bld: 117 mg/dL — ABNORMAL HIGH (ref 70–99)
Potassium: 4.3 mmol/L (ref 3.5–5.1)
Sodium: 138 mmol/L (ref 135–145)
Total Bilirubin: 1 mg/dL (ref 0.3–1.2)
Total Protein: 7.5 g/dL (ref 6.5–8.1)

## 2019-11-26 LAB — RAPID URINE DRUG SCREEN, HOSP PERFORMED
Amphetamines: NOT DETECTED
Barbiturates: NOT DETECTED
Benzodiazepines: NOT DETECTED
Cocaine: NOT DETECTED
Opiates: NOT DETECTED
Tetrahydrocannabinol: NOT DETECTED

## 2019-11-26 LAB — CBC
HCT: 38.5 % — ABNORMAL LOW (ref 39.0–52.0)
Hemoglobin: 12.5 g/dL — ABNORMAL LOW (ref 13.0–17.0)
MCH: 30.2 pg (ref 26.0–34.0)
MCHC: 32.5 g/dL (ref 30.0–36.0)
MCV: 93 fL (ref 80.0–100.0)
Platelets: 315 10*3/uL (ref 150–400)
RBC: 4.14 MIL/uL — ABNORMAL LOW (ref 4.22–5.81)
RDW: 13.2 % (ref 11.5–15.5)
WBC: 13.9 10*3/uL — ABNORMAL HIGH (ref 4.0–10.5)
nRBC: 0 % (ref 0.0–0.2)

## 2019-11-26 LAB — DIFFERENTIAL
Abs Immature Granulocytes: 0.03 10*3/uL (ref 0.00–0.07)
Basophils Absolute: 0.1 10*3/uL (ref 0.0–0.1)
Basophils Relative: 1 %
Eosinophils Absolute: 0.3 10*3/uL (ref 0.0–0.5)
Eosinophils Relative: 2 %
Immature Granulocytes: 0 %
Lymphocytes Relative: 24 %
Lymphs Abs: 3.4 10*3/uL (ref 0.7–4.0)
Monocytes Absolute: 0.9 10*3/uL (ref 0.1–1.0)
Monocytes Relative: 6 %
Neutro Abs: 9.3 10*3/uL — ABNORMAL HIGH (ref 1.7–7.7)
Neutrophils Relative %: 67 %

## 2019-11-26 LAB — I-STAT CHEM 8, ED
BUN: 39 mg/dL — ABNORMAL HIGH (ref 8–23)
Calcium, Ion: 1.26 mmol/L (ref 1.15–1.40)
Chloride: 108 mmol/L (ref 98–111)
Creatinine, Ser: 1.8 mg/dL — ABNORMAL HIGH (ref 0.61–1.24)
Glucose, Bld: 113 mg/dL — ABNORMAL HIGH (ref 70–99)
HCT: 39 % (ref 39.0–52.0)
Hemoglobin: 13.3 g/dL (ref 13.0–17.0)
Potassium: 4.4 mmol/L (ref 3.5–5.1)
Sodium: 142 mmol/L (ref 135–145)
TCO2: 21 mmol/L — ABNORMAL LOW (ref 22–32)

## 2019-11-26 LAB — CBG MONITORING, ED
Glucose-Capillary: 110 mg/dL — ABNORMAL HIGH (ref 70–99)
Glucose-Capillary: 130 mg/dL — ABNORMAL HIGH (ref 70–99)

## 2019-11-26 LAB — URINALYSIS, ROUTINE W REFLEX MICROSCOPIC
Bilirubin Urine: NEGATIVE
Glucose, UA: NEGATIVE mg/dL
Hgb urine dipstick: NEGATIVE
Ketones, ur: NEGATIVE mg/dL
Leukocytes,Ua: NEGATIVE
Nitrite: NEGATIVE
Protein, ur: 100 mg/dL — AB
Specific Gravity, Urine: 1.019 (ref 1.005–1.030)
pH: 5 (ref 5.0–8.0)

## 2019-11-26 LAB — GLUCOSE, CAPILLARY: Glucose-Capillary: 144 mg/dL — ABNORMAL HIGH (ref 70–99)

## 2019-11-26 LAB — SARS CORONAVIRUS 2 BY RT PCR (HOSPITAL ORDER, PERFORMED IN ~~LOC~~ HOSPITAL LAB): SARS Coronavirus 2: NEGATIVE

## 2019-11-26 LAB — APTT: aPTT: 31 seconds (ref 24–36)

## 2019-11-26 LAB — PROTIME-INR
INR: 1 (ref 0.8–1.2)
Prothrombin Time: 13 seconds (ref 11.4–15.2)

## 2019-11-26 LAB — ETHANOL: Alcohol, Ethyl (B): <10 mg/dL (ref <10)

## 2019-11-26 MED ORDER — DULAGLUTIDE 0.75 MG/0.5ML ~~LOC~~ SOAJ: 0.7500 mg | SUBCUTANEOUS | Status: DC

## 2019-11-26 MED ORDER — GADOBUTROL 1 MMOL/ML IV SOLN
7.0000 mL | Freq: Once | INTRAVENOUS | Status: AC | PRN
  Administered 2019-11-26: 7 mL via INTRAVENOUS

## 2019-11-26 MED ORDER — INSULIN GLARGINE 100 UNIT/ML ~~LOC~~ SOLN
10.0000 [IU] | Freq: Every day | SUBCUTANEOUS | Status: DC
  Administered 2019-11-26 – 2019-11-28 (×3): 10 [IU] via SUBCUTANEOUS
  Filled 2019-11-26 (×6): qty 0.1

## 2019-11-26 MED ORDER — GABAPENTIN 300 MG PO CAPS
300.0000 mg | ORAL_CAPSULE | Freq: Three times a day (TID) | ORAL | Status: DC
  Administered 2019-11-26 – 2019-11-29 (×10): 300 mg via ORAL
  Filled 2019-11-26 (×10): qty 1

## 2019-11-26 MED ORDER — SODIUM CHLORIDE 0.9 % IV SOLN: INTRAVENOUS | Status: DC

## 2019-11-26 MED ORDER — PANTOPRAZOLE SODIUM 40 MG PO TBEC
40.0000 mg | DELAYED_RELEASE_TABLET | Freq: Every day | ORAL | Status: DC
  Administered 2019-11-26 – 2019-11-29 (×4): 40 mg via ORAL
  Filled 2019-11-26 (×4): qty 1

## 2019-11-26 MED ORDER — ATORVASTATIN CALCIUM 80 MG PO TABS
80.0000 mg | ORAL_TABLET | Freq: Every day | ORAL | Status: DC
  Administered 2019-11-26 – 2019-11-28 (×3): 80 mg via ORAL
  Filled 2019-11-26 (×3): qty 1

## 2019-11-26 MED ORDER — SODIUM CHLORIDE 0.9% FLUSH
3.0000 mL | Freq: Once | INTRAVENOUS | Status: AC
Start: 2019-11-26 — End: 2019-11-26
  Administered 2019-11-26: 3 mL via INTRAVENOUS

## 2019-11-26 MED ORDER — ACETAMINOPHEN 500 MG PO TABS: 1000.0000 mg | ORAL_TABLET | Freq: Four times a day (QID) | ORAL | Status: DC | PRN

## 2019-11-26 MED ORDER — ASPIRIN EC 325 MG PO TBEC
325.0000 mg | DELAYED_RELEASE_TABLET | Freq: Every day | ORAL | Status: DC
  Administered 2019-11-27 – 2019-11-29 (×3): 325 mg via ORAL
  Filled 2019-11-26 (×3): qty 1

## 2019-11-26 MED ORDER — ASPIRIN EC 325 MG PO TBEC
325.0000 mg | DELAYED_RELEASE_TABLET | Freq: Every day | ORAL | Status: DC
  Administered 2019-11-26: 325 mg via ORAL
  Filled 2019-11-26: qty 1

## 2019-11-26 MED ORDER — BISMUTH SUBSALICYLATE 262 MG/15ML PO SUSP
30.0000 mL | Freq: Four times a day (QID) | ORAL | Status: DC | PRN
  Administered 2019-11-28: 30 mL via ORAL
  Filled 2019-11-26: qty 236

## 2019-11-26 MED ORDER — CLOPIDOGREL BISULFATE 75 MG PO TABS
75.0000 mg | ORAL_TABLET | Freq: Every day | ORAL | Status: DC
  Administered 2019-11-26 – 2019-11-29 (×4): 75 mg via ORAL
  Filled 2019-11-26 (×4): qty 1

## 2019-11-26 MED ORDER — ENOXAPARIN SODIUM 30 MG/0.3ML ~~LOC~~ SOLN
30.0000 mg | SUBCUTANEOUS | Status: DC
  Administered 2019-11-26 – 2019-11-29 (×4): 30 mg via SUBCUTANEOUS
  Filled 2019-11-26 (×4): qty 0.3

## 2019-11-26 MED ORDER — IPRATROPIUM-ALBUTEROL 0.5-2.5 (3) MG/3ML IN SOLN: 3.0000 mL | RESPIRATORY_TRACT | Status: DC | PRN

## 2019-11-26 MED ORDER — STROKE: EARLY STAGES OF RECOVERY BOOK
Freq: Once | Status: AC
  Filled 2019-11-26 (×2): qty 1

## 2019-11-26 MED ORDER — INSULIN ASPART 100 UNIT/ML ~~LOC~~ SOLN
0.0000 [IU] | Freq: Three times a day (TID) | SUBCUTANEOUS | Status: DC
  Administered 2019-11-26: 2 [IU] via SUBCUTANEOUS
  Administered 2019-11-27: 3 [IU] via SUBCUTANEOUS
  Administered 2019-11-27 – 2019-11-28 (×2): 2 [IU] via SUBCUTANEOUS

## 2019-11-26 NOTE — ED Notes (Signed)
Report attempted, RN to call back. 

## 2019-11-26 NOTE — Consult Note (Addendum)
Neurology Consultation  Reason for Consult: Stroke  Referring Physician: Guilford Shi, MD  CC: Facial droop, dysarthria  History is obtained from: Daughter  HPI: Kelly Williams is a 79 y.o. male with a past medical history of stroke, hypertension, hyperlipidemia, diabetes, carotid disease with carotid endarterectomy on the left along with medication noncompliance and alcohol abuse.  Daughter states that the patient was last seen normal on Thursday night at approximately 8 PM.  On Friday she had gotten home approximately 5:30 PM, at that point he was intoxicated and had fallen down.  She states that he has multiple falls secondary to neuropathy.  Often times when he is intoxicated he will isolate himself thus on Saturday morning she thought that he had isolated himself in the room as he was not seen till 11 PM Saturday night.  It was at that point that the daughter noticed he was still slurring his speech.  She still was not sure if he had snuck some alcohol in the room but due to the speech she was concerned and brought him to the ER.    She states that since Friday she has noticed that he has had difficulty getting his words out and also has had periods of aggression which he did not have previously.  She states that the patient is noncompliant with all of his medications other than the fact that she gives his insulin to him.  He is on aspirin but does not take it on a regular basis.  He does drink excessively.  While in the ED patient did have an MRI which revealed acute infarcts involving the right frontal and parietal lobes. MRA of neck revealed focal stenosis in the right internal carotid artery approximately 3 cm distal to the bifurcation with an adjacent 11 mm aneurysmal dilatation.    LKW: 11/22/2019 at approximately 8 PM tpa given?: no, out of window Premorbid modified Rankin scale (mRS): 1   NIHSS 1a Level of Conscious.: 0 1b LOC Questions: 0 1c LOC Commands: 0 2 Best Gaze:  0 3 Visual: 0 4 Facial Palsy: 2 5a Motor Arm - left: 0 5b Motor Arm - Right:0  6a Motor Leg - Left: 0 6b Motor Leg - Right: 0 7 Limb Ataxia: 2 8 Sensory: 1 9 Best Language: 1 10 Dysarthria: 1 11 Extinct. and Inatten.: 0 TOTAL: 6   Past Medical History:  Diagnosis Date  . Alcohol abuse   . Alcohol abuse   . Arthritis   . Carotid arterial disease ([place])   . Chronic kidney disease    CKD-patient denies  . COPD (chronic obstructive pulmonary disease) (HCC)    uses inhalers  . Coronary artery disease   . Diabetes mellitus without complication ([place])   . Diabetic retinopathy ([place])   . Hyperlipidemia   . Hypertension   . Normocytic anemia 01/16/2019  . Stroke [facility])    around 2020, 2 after bypass in 2015    Family History  Problem Relation Age of Onset  . Hypertension Mother   . Hypertension Father    Social History:   reports that he quit smoking about 17 years ago. His smoking use included cigarettes. He has a 10.00 pack-year smoking history. He has never used smokeless tobacco. He reports previous alcohol use. He reports that he does not use drugs.  Medications  Current Facility-Administered Medications:  .   stroke: mapping our early stages of recovery book, , Does not apply, Once, Guilford Shi, MD .  0.9 %  sodium chloride infusion, , Intravenous, Continuous, Kamineni, Lamount Cranker, MD, Last Rate: 75 mL/hr at 11/26/19 1521, New Bag at 11/26/19 1521 .  acetaminophen (TYLENOL) tablet 1,000 mg, 1,000 mg, Oral, Q6H PRN, Guilford Shi, MD .  aspirin EC tablet 325 mg, 325 mg, Oral, Daily, Marliss Coots, PA-C .  atorvastatin (LIPITOR) tablet 80 mg, 80 mg, Oral, QHS, Kamineni, Neelima, MD .  bismuth subsalicylate (PEPTO BISMOL) 262 MG/15ML suspension 30 mL, 30 mL, Oral, Q6H PRN, Kamineni, Neelima, MD .  clopidogrel (PLAVIX) tablet 75 mg, 75 mg, Oral, Daily, Marliss Coots, PA-C .  enoxaparin (LOVENOX) injection 30 mg, 30 mg, Subcutaneous, Q24H, Kamineni, Neelima, MD, 30 mg  at 11/26/19 1515 .  gabapentin (NEURONTIN) capsule 300 mg, 300 mg, Oral, TID, Kamineni, Neelima, MD, 300 mg at 11/26/19 1515 .  insulin aspart (novoLOG) injection 0-15 Units, 0-15 Units, Subcutaneous, TID WC, Guilford Shi, MD, 2 Units at 11/26/19 1650 .  insulin glargine (LANTUS) injection 10 Units, 10 Units, Subcutaneous, QHS, Kamineni, Neelima, MD .  ipratropium-albuterol (DUONEB) 0.5-2.5 (3) MG/3ML nebulizer solution 3 mL, 3 mL, Nebulization, Q4H PRN, Kamineni, Neelima, MD .  pantoprazole (PROTONIX) EC tablet 40 mg, 40 mg, Oral, Daily, Kamineni, Neelima, MD, 40 mg at 11/26/19 1515  ROS:     General ROS: negative for - chills, fatigue, fever, night sweats, weight gain or weight loss Psychological ROS: negative for - behavioral disorder, hallucinations, memory difficulties, mood swings or suicidal ideation Ophthalmic ROS: negative for - blurry vision, double vision, eye pain or loss of vision ENT ROS: negative for - epistaxis, nasal discharge, oral lesions, sore throat, tinnitus or vertigo Allergy and Immunology ROS: negative for - hives or itchy/watery eyes Hematological and Lymphatic ROS: negative for - bleeding problems, bruising or swollen lymph nodes Endocrine ROS: negative for - galactorrhea, hair pattern changes, polydipsia/polyuria or temperature intolerance Respiratory ROS: negative for - cough, hemoptysis, shortness of breath or wheezing Cardiovascular ROS: negative for - chest pain, dyspnea on exertion, edema or irregular heartbeat Gastrointestinal ROS: negative for - abdominal pain, diarrhea, hematemesis, nausea/vomiting or stool incontinence Genito-Urinary ROS: negative for - dysuria, hematuria, incontinence or urinary frequency/urgency Musculoskeletal ROS: negative for - joint swelling or muscular weakness Neurological ROS: as noted in HPI Dermatological ROS: negative for rash and skin lesion changes  Exam: Current vital signs: BP (!) 169/83   Pulse 89   Temp 98.2 F  (36.8 C) (Oral)   Resp 20   SpO2 99%  Vital signs in last 24 hours: Temp:  [97.7 F (36.5 C)-98.2 F (36.8 C)] 98.2 F (36.8 C) (07/26 1658) Pulse Rate:  [54-94] 89 (07/26 1658) Resp:  [15-21] 20 (07/26 1658) BP: (119-193)/(65-101) 169/83 (07/26 1658) SpO2:  [96 %-100 %] 99 % (07/26 1658)   Constitutional: Appears well-developed and well-nourished.  Psych: Affect appropriate to situation Eyes: No scleral injection HENT: No OP obstrucion Head: Normocephalic.  Cardiovascular: Normal rate and regular rhythm.  Respiratory: Effort normal, non-labored breathing GI: Soft.  No distension. There is no tenderness.  Skin: WDI  Neuro: Mental Status: Patient is awake, alert, oriented to person, place, month, year, and situation. Speech is dysarthric. Intact naming, repeating, comprehension.  Patient is able to follow simple commands. Cranial Nerves: II: Visual Fields are full. PERRL III,IV, VI: EOMI without ptosis or diplopia.  V: Facial sensation is symmetric to temperature VII: Left facial droop VIII: hearing is intact to voice X: Palate elevates symmetrically XI: Shoulder shrug is symmetric. XII: tongue is midline without atrophy or fasciculations.  Motor: 5/5 strength throughout when resisting examiner. However, he does have a positive orbiting fingers sign on the left.  Positive for drift left lower extremity Sensory: Decreased on left leg DSS intact Deep Tendon Reflexes: 2+ and symmetric in the biceps and patellae.  Plantars: Toes are downgoing bilaterally.  Cerebellar: FNF intact. HKS shows ataxia bilaterally  Labs I have reviewed labs in epic and the results pertinent to this consultation are:  CBC    Component Value Date/Time   WBC 13.9 (H) 11/26/2019 0004   RBC 4.14 (L) 11/26/2019 0004   HGB 13.3 11/26/2019 0034   HCT 39.0 11/26/2019 0034   PLT 315 11/26/2019 0004   MCV 93.0 11/26/2019 0004   MCH 30.2 11/26/2019 0004   MCHC 32.5 11/26/2019 0004   RDW 13.2  11/26/2019 0004   LYMPHSABS 3.4 11/26/2019 0004   MONOABS 0.9 11/26/2019 0004   EOSABS 0.3 11/26/2019 0004   BASOSABS 0.1 11/26/2019 0004    CMP     Component Value Date/Time   NA 142 11/26/2019 0034   K 4.4 11/26/2019 0034   CL 108 11/26/2019 0034   CO2 21 (L) 11/26/2019 0004   GLUCOSE 113 (H) 11/26/2019 0034   BUN 39 (H) 11/26/2019 0034   CREATININE 1.80 (H) 11/26/2019 0034   CREATININE 1.34 (H) 08/24/2019 1114   CALCIUM 9.3 11/26/2019 0004   PROT 7.5 11/26/2019 0004   ALBUMIN 3.8 11/26/2019 0004   AST 20 11/26/2019 0004   ALT 17 11/26/2019 0004   ALKPHOS 91 11/26/2019 0004   BILITOT 1.0 11/26/2019 0004   GFRNONAA 35 (L) 11/26/2019 0004   GFRNONAA 50 (L) 08/24/2019 1114   GFRAA 41 (L) 11/26/2019 0004   GFRAA 58 (L) 08/24/2019 1114    Lipid Panel     Component Value Date/Time   CHOL 206 (H) 06/01/2018 1612   TRIG 102 06/01/2018 1612   HDL 40 (L) 06/01/2018 1612   CHOLHDL 5.2 (H) 06/01/2018 1612   LDLCALC 144 (H) 06/01/2018 1612     Imaging I have reviewed the images obtained:  CT scan of the brain: No acute intracranial abnormality.  Old right MCA territory infarct and chronic small vessel disease  MRI examination of the brain - Acute infarcts involving the right frontal and parietal lobes  MRA neck: Patient has had a right carotid endarterectomy.  Right carotid bifurcation patent.  Approximately 3 cm distal to the bifurcation there is a focal stenosis in the internal carotid artery with adjacent 11 mm aneurysm.  Possible pseudoaneurysm.  50% diameter stenosis proximal left internal carotid artery above the bifurcation.  Etta Quill PA-C [facility] [phone] 11/26/2019, 5:24 PM     Assessment: 79 year old male with known medication noncompliance and alcoholism, presenting with prolonged dysarthria after initial intoxication.  1. MRI reveals right frontal and parietal lobe acute ischemic infarctions.  2. MRA neck reveals findings consistent  with prior right carotid endarterectomy. The right carotid bifurcation is patent. However, approximately 3 cm distal to the bifurcation there is a focal stenosis in the internal carotid artery with an adjacent 11 mm aneurysm. 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  3. Other findings on MRA: Mild to moderate stenosis of the distal cervical internal carotid artery is noted. 50% diameter stenosis proximal left internal carotid artery above the bifurcation. Moderate intracranial atherosclerotic disease. Moderate to severe stenosis proximal right subclavian artery. 4. Exam shows bilateral lower extremity ataxia, left lower extremity drift, positive orbiting fingers sign involving the LUE,  dysarthria and mild expressive aphasia.   5. The right ICA aneurysm has been discussed with Dr. Garen Grams who reviewed the films and believes there may be a dissection versus pseudoaneurysm.  At this point the patient will need a diagnostic angiogram to further evaluate. 6. EtOH abuse  Recommendations: -Bilateral cervical carotid angiogram with Interventional Neuroradiology -Transthoracic Echo -Start patient on ASA 325 mg daily and Plavix 75 mg daily. Discussed with Dr. Estanislado Pandy   -Start atorvastatin 80 mg/other high intensity statin -BP goal: Normotensive. Out of the permissive HTN time window -HBAIC and Lipid profile -Telemetry monitoring -Frequent neuro checks -NPO until passes stroke swallow screen -PT/OT --CIWA protocol # please page stroke NP  Or  PA  Or MD from 8am -4 pm  as this patient from this time will be  followed by the stroke.   You can look them up on [email or web address]  Password [email or web address]  Electronically signed: Dr. Kerney Elbe

## 2019-11-26 NOTE — ED Notes (Signed)
Neurologist PA at the bedside.

## 2019-11-26 NOTE — H&P (View-Only) (Signed)
Patient name: Kelly Williams MRN: [id number] DOB: 03-22-1941 Sex: male  REASON FOR CONSULT: Right brain stroke with prior right carotid endarterectomy  HPI: Kelly Williams is a 79 y.o. male, who began to have some slurred speech and left facial droop somewhere around 24 to 36 hours ago.  The patient's daughter stated that he was intoxicated with alcohol over the weekend and does not know exactly when he started to lose with neurologic function.  He did have a prior stroke about 15 years ago.  This was after coronary artery bypass grafting.  He subsequently had a right carotid endarterectomy about 5 years after that at [facility] in [place].  Since that time he had no other neurologic events.  He currently primarily complains mainly of problems with speech although his daughter has also noted a left facial droop.  He has difficulty walking baseline due to severe peripheral neuropathy.  He walks with a cane when he is in public but is able to walk around the house without difficulty.  He currently does not really complain of weakness numbness or tingling in the upper or lower extremities.  Other medical problems include chronic kidney disease, COPD, diabetes, hyperlipidemia, hypertension all of which have been stable.  Most recent serum creatinine prior to today was 1.3.  Today's creatinine was elevated at 1.7.  He is on a statin and Plavix.  Past Medical History:  Diagnosis Date  . Alcohol abuse   . Alcohol abuse   . Arthritis   . Carotid arterial disease ([place])   . Chronic kidney disease    CKD-patient denies  . COPD (chronic obstructive pulmonary disease) (HCC)    uses inhalers  . Coronary artery disease   . Diabetes mellitus without complication ([place])   . Diabetic retinopathy ([place])   . Hyperlipidemia   . Hypertension   . Normocytic anemia 01/16/2019  . Stroke [facility])    around 2020, 2 after bypass in 2015   Past Surgical History:  Procedure Laterality Date  . APPENDECTOMY     as  teenager  . BACK SURGERY    . CAROTID ENDARTERECTOMY Right   . CORONARY ARTERY BYPASS GRAFT     10-12 years ago in [place] at [place] Right 07/18/2019   Procedure: REVERSE SHOULDER ARTHROPLASTY WITH SUBSCAPULAR REPAIR;  Surgeon: Hiram Gash, MD;  Location: [facility] ORS;  Service: Orthopedics;  Laterality: Right;  [name] VASECTOMY      Family History  Problem Relation Age of Onset  . Hypertension Mother   . Hypertension Father     SOCIAL HISTORY: Social History   Socioeconomic History  . Marital status: Divorced    Spouse name: Not on file  . Number of children: Not on file  . Years of education: Not on file  . Highest education level: Not on file  Occupational History  . Not on file  Tobacco Use  . Smoking status: Former Smoker    Packs/day: 1.00    Years: 10.00    Pack years: 10.00    Types: Cigarettes    Quit date: 10/12/2002    Years since quitting: 17.1  . Smokeless tobacco: Never Used  Vaping Use  . Vaping Use: Never used  Substance and Sexual Activity  . Alcohol use: Not Currently  . Drug use: Never  . Sexual activity: Not Currently    Birth control/protection: Abstinence, Surgical  Other Topics Concern  . Not on file  Social  History Narrative  . Not on file   Social Determinants of Health   Financial Resource Strain:   . Difficulty of Paying Living Expenses:   Food Insecurity:   . Worried About Charity fundraiser in the Last Year:   . Arboriculturist in the Last Year:   Transportation Needs:   . Film/video editor (Medical):   [name] Lack of Transportation (Non-Medical):   Physical Activity:   . Days of Exercise per Week:   . Minutes of Exercise per Session:   Stress:   . Feeling of Stress :   Social Connections:   . Frequency of Communication with Friends and Family:   . Frequency of Social Gatherings with Friends and Family:   . Attends Religious Services:   . Active Member of Clubs or Organizations:   .  Attends Archivist Meetings:   [name] Marital Status:   Intimate Partner Violence:   . Fear of Current or Ex-Partner:   . Emotionally Abused:   [name] Physically Abused:   . Sexually Abused:     Allergies  Allergen Reactions  . Metformin And Related Diarrhea    Current Facility-Administered Medications  Medication Dose Route Frequency Provider Last Rate Last Admin  .  stroke: mapping our early stages of recovery book   Does not apply Once Guilford Shi, MD      . 0.9 %  sodium chloride infusion   Intravenous Continuous Guilford Shi, MD 75 mL/hr at 11/26/19 1521 New Bag at 11/26/19 1521  . acetaminophen (TYLENOL) tablet 1,000 mg  1,000 mg Oral Q6H PRN Guilford Shi, MD      . Derrill Memo ON 11/27/2019] aspirin EC tablet 325 mg  325 mg Oral Daily Marliss Coots, PA-C      . atorvastatin (LIPITOR) tablet 80 mg  80 mg Oral QHS Kamineni, Neelima, MD      . bismuth subsalicylate (PEPTO BISMOL) 262 MG/15ML suspension 30 mL  30 mL Oral Q6H PRN Guilford Shi, MD      . clopidogrel (PLAVIX) tablet 75 mg  75 mg Oral Daily Etta Quill R, PA-C      . enoxaparin (LOVENOX) injection 30 mg  30 mg Subcutaneous Q24H Guilford Shi, MD   30 mg at 11/26/19 1515  . gabapentin (NEURONTIN) capsule 300 mg  300 mg Oral TID Guilford Shi, MD   300 mg at 11/26/19 1515  . insulin aspart (novoLOG) injection 0-15 Units  0-15 Units Subcutaneous TID WC Guilford Shi, MD   2 Units at 11/26/19 1650  . insulin glargine (LANTUS) injection 10 Units  10 Units Subcutaneous QHS Kamineni, Neelima, MD      . ipratropium-albuterol (DUONEB) 0.5-2.5 (3) MG/3ML nebulizer solution 3 mL  3 mL Nebulization Q4H PRN Kamineni, Neelima, MD      . pantoprazole (PROTONIX) EC tablet 40 mg  40 mg Oral Daily Guilford Shi, MD   40 mg at 11/26/19 1515    ROS:   General:  No weight loss, Fever, chills  HEENT: No recent headaches, no nasal bleeding, no visual changes, no sore throat  Neurologic: No dizziness,  blackouts, seizures. No recent symptoms of stroke or mini- stroke. No recent episodes of slurred speech, or temporary blindness.  Cardiac: No recent episodes of chest pain/pressure, no shortness of breath at rest.  + shortness of breath with exertion.  Denies history of atrial fibrillation or irregular heartbeat  Vascular: No history of rest pain in feet.  No history of claudication.  No history of non-healing ulcer, No history of DVT   Pulmonary: No home oxygen, no productive cough, no hemoptysis,  No asthma or wheezing  Musculoskeletal:  [ ]  Arthritis, [ ]  Low back pain,  [ ]  Joint pain  Hematologic:No history of hypercoagulable state.  No history of easy bleeding.  No history of anemia  Gastrointestinal: No hematochezia or melena,  No gastroesophageal reflux, no trouble swallowing  Urinary: [X]  chronic Kidney disease, [ ]  on HD - [ ]  MWF or [ ]  TTHS, [ ]  Burning with urination, [ ]  Frequent urination, [ ]  Difficulty urinating;   Skin: No rashes  Psychological: No history of anxiety,  No history of depression   Physical Examination  Vitals:   11/26/19 1531 11/26/19 1532 11/26/19 1600 11/26/19 1658  BP: (!) 151/77  (!) 140/87 (!) 169/83  Pulse:  59 78 89  Resp: 19 19 18 20   Temp:    98.2 F (36.8 C)  TempSrc:    Oral  SpO2:  96% 98% 99%    There is no height or weight on file to calculate BMI.  General:  Alert and oriented, no acute distress HEENT: Normal, with the exception of mild loss of left nasolabial fold and left facial droop, he has slurred speech Neck: No JVD, well-healed right neck scar consistent with prior carotid endarterectomy Pulmonary: Clear to auscultation bilaterally Cardiac: Regular Rate and Rhythm Extremity Pulses:  2+ radial, brachial pulses bilaterally Musculoskeletal: No deformity or edema  Neurologic: Upper and lower extremity motor 5/5 and symmetric, mild left facial droop, slurred speech  DATA:  Patient had a CT scan of the head and MRI of the  brain today.  This shows acute right frontal and parietal infarcts.  MRA of the neck was also performed which suggested distal internal carotid artery stenosis and "1.1 cm aneurysm"  ASSESSMENT: Acute right brain stroke possibly related to recurrent carotid occlusive disease.  Difficult to know if he actually has a patch aneurysm from his previous carotid endarterectomy has a 1.1 cm is fairly small and it is ill-defined on my review of the images.   PLAN: Continue his Plavix and statin.  We will obtain a carotid duplex scan to further define the stenosis on the right side as well as any other anatomical features.  IV hydration today with the plan to get a CT angiogram of the neck within the next 1 to 2 days to further define the anatomy.   Ruta Hinds, MD [facility] Office: [phone] Pager: [phone]

## 2019-11-26 NOTE — ED Notes (Signed)
Transported to MRI

## 2019-11-26 NOTE — ED Notes (Signed)
Pt aware of need for urine sample, urinal at bedside 

## 2019-11-26 NOTE — ED Triage Notes (Signed)
Pt's family reports last seen normal was Thursday.  Today she noticed left sided facial droop, slurred speech and labile emotions.

## 2019-11-26 NOTE — H&P (Addendum)
History and Physical    DOA: 11/25/2019  PCP: Luetta Nutting, DO  Patient coming from: Home  Chief Complaint: Dysarthria x3 days  HPI: Kelly Williams is a 79 y.o. male with history h/o hypertension, hyperlipidemia, DM, COPD, CKD, chronic alcohol abuse, CAD s/p CABG, post CABG CVA in 2020 presents now with complaints of dysarthria as noted by daughter 3 days prior to admission.  Patient has history of chronic alcohol abuse and has had intermittent episodes of binge drinking-he apparently fell 2 days prior injuring his left jaw/has a bruise under lower lip.  He does have history of COPD and chronic cough but no acute worsening.  Patient's daughter who is a nurse was concerned about persistent dysarthria and brought him to the ED for stroke evaluation.  Per daughter, no report of dysphagia and he was eating his meals/popcorn while watching movies without any problems.  Patient noted to be eating crackers in the ED during my evaluation.  He is able to move all extremities and denies any focal weakness or numbness.  He does have chronic numbness in his feet from peripheral neuropathy.  He takes Trulicity weekly injections every Saturday but did not take this weekend.  According to the daughter he is noncompliant with aspirin 81 mg daily that he is supposed to be taking, apparently ends up taking 3-4 times a week.  He is does not drink alcohol daily but tends to binge about sixpack beer (12 ounces each) on weekends-last drink was in May.  Never had acute withdrawals.  Daughter feels his speech is still somewhat slurred compared to baseline but improving. ED course: Temp 90 7.38F, pulse 91, respiratory rate 17, BP 167/90-192/94, O2 sat 100% on room air.  WBC 13.9, hemoglobin 12.5, platelet 315, sodium 142, potassium 4.4, chloride 108, BUN 39, creatinine 1.8, glucose 113, calcium 9.3, INR 1.0.  CT head revealed Old right MCA territory infarct and chronic small vessel disease.  MRI revealed acute right frontal and  parietal infarcts in addition to old infarcts.  Neurology consulted and hospitalist requested to admit patient for further evaluation.   Review of Systems: As per HPI otherwise 10 point review of systems negative.    Past Medical History:  Diagnosis Date  . Alcohol abuse   . Alcohol abuse   . Arthritis   . Carotid arterial disease ([place])   . Chronic kidney disease    CKD-patient denies  . COPD (chronic obstructive pulmonary disease) (HCC)    uses inhalers  . Coronary artery disease   . Diabetes mellitus without complication ([place])   . Diabetic retinopathy ([place])   . Hyperlipidemia   . Hypertension   . Normocytic anemia 01/16/2019  . Stroke [facility])    around 2020, 2 after bypass in 2015    Past Surgical History:  Procedure Laterality Date  . APPENDECTOMY     as teenager  . BACK SURGERY    . CAROTID ENDARTERECTOMY Right   . CORONARY ARTERY BYPASS GRAFT     10-12 years ago in [place] at [place] Right 07/18/2019   Procedure: REVERSE SHOULDER ARTHROPLASTY WITH SUBSCAPULAR REPAIR;  Surgeon: Hiram Gash, MD;  Location: [facility] ORS;  Service: Orthopedics;  Laterality: Right;  [name] VASECTOMY      Social history:  reports that he quit smoking about 17 years ago. His smoking use included cigarettes. He has a 10.00 pack-year smoking history. He has never used smokeless tobacco. He reports previous alcohol use. He  reports that he does not use drugs.   Allergies  Allergen Reactions  . Metformin And Related Diarrhea    No family history on file.    Prior to Admission medications   Medication Sig Start Date End Date Taking? Authorizing Provider  acetaminophen (TYLENOL) 500 MG tablet Take 1,000 mg by mouth every 6 (six) hours as needed for mild pain.   Yes [provider]  albuterol (PROVENTIL HFA;VENTOLIN HFA) 108 (90 Base) MCG/ACT inhaler Inhale 1-2 puffs into the lungs every 4 (four) hours as needed for wheezing or shortness of breath  (bronchospasm). 06/20/18  Yes Trixie Dredge, PA-C  aspirin EC 81 MG tablet Take 1 tablet (81 mg total) by mouth daily. Patient taking differently: Take 81 mg by mouth at bedtime.  06/25/19  Yes Buford Dresser, MD  atorvastatin (LIPITOR) 40 MG tablet Take 1 tablet (40 mg total) by mouth at bedtime. 09/07/19  Yes Luetta Nutting, DO  bismuth subsalicylate (PEPTO BISMOL) 262 MG/15ML suspension Take 30 mLs by mouth every 6 (six) hours as needed for indigestion or diarrhea or loose stools.   Yes [provider]  celecoxib (CELEBREX) 100 MG capsule Take 1 capsule (100 mg total) by mouth 2 (two) times daily as needed for mild pain. Patient taking differently: Take 200 mg by mouth 2 (two) times daily as needed for mild pain.  09/10/19  Yes Luetta Nutting, DO  Dulaglutide (TRULICITY) 7.51 ZG/0.1VC SOPN Inject 0.75 mg into the skin once a week. Patient taking differently: Inject 0.75 mg into the skin once a week. Saturday 09/10/19  Yes Luetta Nutting, DO  gabapentin (NEURONTIN) 300 MG capsule Take 1 capsule (300 mg total) by mouth 3 (three) times daily. 09/07/19  Yes Luetta Nutting, DO  ipratropium-albuterol (DUONEB) 0.5-2.5 (3) MG/3ML SOLN Take 3 mLs by nebulization every 6 (six) hours as needed. Patient taking differently: Take 3 mLs by nebulization every 6 (six) hours as needed (COPD).  10/27/18  Yes Trixie Dredge, PA-C  losartan (COZAAR) 25 MG tablet Take 1 tablet (25 mg total) by mouth every morning. Patient taking differently: Take 25 mg by mouth at bedtime.  06/25/19  Yes Buford Dresser, MD  Alcohol Swabs (B-D SINGLE USE SWABS REGULAR) PADS Use to check glucose daily Patient taking differently: 1 each by Other route See admin instructions. Use to check glucose daily 09/07/19   Luetta Nutting, DO  Blood Glucose Monitoring Suppl (TRUE METRIX AIR GLUCOSE METER) DEVI Dx DM E11.9 Check fasting blood sugar every morning and once 2 hours after largest meal of the  day. Patient taking differently: 1 each by Other route See admin instructions. Dx DM E11.9 Check fasting blood sugar every morning and once 2 hours after largest meal of the day. 09/13/19   Luetta Nutting, DO  glucose blood (TRUE METRIX BLOOD GLUCOSE TEST) test strip Use as instructed Patient taking differently: 1 each by Other route as directed.  09/07/19   Luetta Nutting, DO  omeprazole (PRILOSEC) 20 MG capsule Take 1 capsule (20 mg total) by mouth daily. 07/18/19 08/17/19  Ethelda Chick, PA-C  Respiratory Therapy Supplies (NEBULIZER/TUBING/MOUTHPIECE) KIT Use as directed with nebulizer treatments (Duoneb) every 4 hours prn. Dx: J44.9 Patient taking differently: 1 each by Other route See admin instructions. Use as directed with nebulizer treatments (Duoneb) every 4 hours prn. Dx: J44.9 06/20/18   Trixie Dredge, PA-C  Tiotropium Bromide-Olodaterol (STIOLTO RESPIMAT) 2.5-2.5 MCG/ACT AERS Inhale 2 puffs into the lungs daily. Patient not taking: Reported on 11/26/2019 10/27/18  Trixie Dredge, PA-C  TRUEplus Lancets 30G MISC Use to check glucose daily Patient taking differently: 1 each by Other route daily.  09/07/19   Luetta Nutting, DO    Physical Exam: Vitals:   11/26/19 1230 11/26/19 1245 11/26/19 1317 11/26/19 1330  BP: (!) 143/89 (!) 143/75 (!) 166/81 (!) 143/65  Pulse: 89 89 92 94  Resp: 19 15 21 17   Temp:      TempSrc:      SpO2: 100% 99% 100% 99%    Constitutional: NAD, calm, comfortable Eyes: PERRL, lids and conjunctivae normal ENMT: Mucous membranes are moist. Posterior pharynx clear of any exudate or lesions.Normal dentition.  Neck: normal, supple, no masses, no thyromegaly Respiratory: clear to auscultation bilaterally, no wheezing, no crackles. Normal respiratory effort. No accessory muscle use.  Cardiovascular: Regular rate and rhythm, no murmurs / rubs / gallops. No extremity edema. 2+ pedal pulses.  Abdomen: no tenderness, no masses palpated. No  hepatosplenomegaly. Bowel sounds positive.  Musculoskeletal: no clubbing / cyanosis. No joint deformity upper and lower extremities. Good ROM, no contractures. Normal muscle tone.  Neurologic: Mild dysarthria.  CN 2-12 grossly intact. Sensation intact, DTR normal. Strength 5/5 in all 4.  Psychiatric: Normal judgment and insight. Alert and oriented x 3. Normal mood.  SKIN/catheters: Small bruise along mandibular area below the lower lip from recent fall.  Labs on Admission: I have personally reviewed following labs and imaging studies  CBC: Recent Labs  Lab 11/26/19 0004 11/26/19 0034  WBC 13.9*  --   NEUTROABS 9.3*  --   HGB 12.5* 13.3  HCT 38.5* 39.0  MCV 93.0  --   PLT 315  --    Basic Metabolic Panel: Recent Labs  Lab 11/26/19 0004 11/26/19 0034  NA 138 142  K 4.3 4.4  CL 107 108  CO2 21*  --   GLUCOSE 117* 113*  BUN 39* 39*  CREATININE 1.79* 1.80*  CALCIUM 9.3  --    GFR: CrCl cannot be calculated (Unknown ideal weight.). Recent Labs  Lab 11/26/19 0004  WBC 13.9*   Liver Function Tests: Recent Labs  Lab 11/26/19 0004  AST 20  ALT 17  ALKPHOS 91  BILITOT 1.0  PROT 7.5  ALBUMIN 3.8   No results for input(s): LIPASE, AMYLASE in the last 168 hours. No results for input(s): AMMONIA in the last 168 hours. Coagulation Profile: Recent Labs  Lab 11/26/19 0004  INR 1.0   Cardiac Enzymes: No results for input(s): CKTOTAL, CKMB, CKMBINDEX, TROPONINI in the last 168 hours. BNP (last 3 results) No results for input(s): PROBNP in the last 8760 hours. HbA1C: No results for input(s): HGBA1C in the last 72 hours. CBG: Recent Labs  Lab 11/26/19 0001  GLUCAP 110*   Lipid Profile: No results for input(s): CHOL, HDL, LDLCALC, TRIG, CHOLHDL, LDLDIRECT in the last 72 hours. Thyroid Function Tests: No results for input(s): TSH, T4TOTAL, FREET4, T3FREE, THYROIDAB in the last 72 hours. Anemia Panel: No results for input(s): VITAMINB12, FOLATE, FERRITIN, TIBC,  IRON, RETICCTPCT in the last 72 hours. Urine analysis:    Component Value Date/Time   COLORURINE YELLOW 11/26/2019 0915   APPEARANCEUR CLEAR 11/26/2019 0915   LABSPEC 1.019 11/26/2019 0915   PHURINE 5.0 11/26/2019 0915   GLUCOSEU NEGATIVE 11/26/2019 0915   HGBUR NEGATIVE 11/26/2019 0915   BILIRUBINUR NEGATIVE 11/26/2019 0915   KETONESUR NEGATIVE 11/26/2019 0915   PROTEINUR 100 (A) 11/26/2019 0915   NITRITE NEGATIVE 11/26/2019 0915   LEUKOCYTESUR NEGATIVE 11/26/2019 0915  Radiological Exams on Admission: Personally reviewed  CT HEAD WO CONTRAST  Result Date: 11/26/2019 CLINICAL DATA:  Left-sided facial droop with slurred speech EXAM: CT HEAD WITHOUT CONTRAST TECHNIQUE: Contiguous axial images were obtained from the base of the skull through the vertex without intravenous contrast. COMPARISON:  None. FINDINGS: Brain: There is no mass, hemorrhage or extra-axial collection. The size and configuration of the ventricles and extra-axial CSF spaces are normal. There is hypoattenuation of the white matter, most commonly indicating chronic small vessel disease. Old right MCA territory infarct is unchanged. There are old small vessel infarcts of the left basal ganglia and right thalamus. Vascular: No abnormal hyperdensity of the major intracranial arteries or dural venous sinuses. No intracranial atherosclerosis. Skull: The visualized skull base, calvarium and extracranial soft tissues are normal. Sinuses/Orbits: No fluid levels or advanced mucosal thickening of the visualized paranasal sinuses. No mastoid or middle ear effusion. The orbits are normal. IMPRESSION: 1. No acute intracranial abnormality. 2. Old right MCA territory infarct and chronic small vessel disease. Electronically Signed   By: Ulyses Jarred M.D.   On: 11/26/2019 00:56   MR BRAIN WO CONTRAST  Result Date: 11/26/2019 CLINICAL DATA:  Neuro deficit. EXAM: MRI HEAD WITHOUT CONTRAST TECHNIQUE: Multiplanar, multiecho pulse sequences of  the brain and surrounding structures were obtained without intravenous contrast. COMPARISON:  11/26/2019 head CT.  01/16/2019 MRI head. FINDINGS: Please note image quality is mildly degraded by motion artifact. Brain: Focal restricted diffusion involving the right frontal and parietal regions with correlative T2/FLAIR hyperintense signal. Sequela of remote right MCA territory, bilateral occipital and right temporal insults. Remote lacunar infarcts involving the right corona radiata, bilateral basal ganglia, midbrain and right thalamus. Tiny remote left cerebellar infarct. Moderate diffuse parenchymal volume loss with ex vacuo dilatation. Remote tiny right thalamic microhemorrhage. No midline shift, mass lesion or extra-axial fluid collection. Scattered and confluent background white matter T2/FLAIR hyperintense foci are nonspecific however commonly associated with chronic microvascular ischemic changes. Vascular: Grossly normal flow voids. Tortuous basilar artery and right V4 segment abutting the right anterolateral medulla. Skull and upper cervical spine: Normal marrow signal. Upper cervical spondylosis. Sinuses/Orbits: Sequela of bilateral lens replacement. Mild ethmoid sinus mucosal thickening. No mastoid effusion. Other: None. IMPRESSION: Acute infarcts involving the right frontal and parietal lobes. Remote infarcts involving the right MCA territory, bilateral occipital, right temporal region and left cerebellum. Remote lacunar infarcts involving the right corona radiata, bilateral basal ganglia, midbrain and right thalamus. Moderate cerebral atrophy. Advanced chronic microvascular ischemic changes. These results were called by telephone at the time of interpretation on 11/26/2019 at 10:21 am to provider [facility] , who verbally acknowledged these results. Electronically Signed   By: Primitivo Gauze M.D.   On: 11/26/2019 10:25    EKG: Independently reviewed.  Normal sinus rhythm no acute ST-T  changes     Assessment and Plan:   Principal Problem:   CVA (cerebral vascular accident) ([place]) Active Problems:   Coronary artery disease   Carotid arterial disease (HCC)   S/P CABG x 5   Chronic obstructive pulmonary disease ([place])   Type 2 diabetes mellitus with diabetic neuropathy, unspecified ([place])   Ambulates with cane   Essential hypertension    1.  Acute CVA: CT head revealed old infarcts but MRI shows Acute infarcts involving the right frontal and parietal lobes.Remote infarcts involving the right MCA territory, bilateral occipital, right temporal region and left cerebellum. Remote lacunar infarcts involving the right corona radiata, bilateral basal ganglia, midbrain and right  thalamus.Moderate cerebral atrophy.  Patient on aspirin 81 mg at baseline, will admit with 325 mg aspirin, permissive hypertension.  Neurology evaluation requested-may need Plavix or anticoagulation if thromboemboli suspected from cardiac versus carotid source.  Echocardiogram deferred as had recently done in February 2021-will repeat if neurology recommends.  N.p.o. until dysphagia ruled out.  Neurochecks per stroke protocol.  Will need speech therapy, PT/OT evaluation  2.  Bilateral carotid artery stenosis: Vascular ultrasound from March 2021 showed right ICA are consistent with a 40-59% stenosis. Heavy plaque burden noted in the mid ICA, s/p  history of CEA. Non-hemodynamically significant plaque <50% noted in the  CCA. Left Carotid: Velocities in the left ICA are consistent with a 60-79% stenosis.   3.  Hypertension: Permissive hypertension in the setting of acute stroke and carotid stenosis for now although stroke symptoms seem to have started 3 days prior.  May resume home medications at lower dose if significantly elevated blood pressure.  4.  AKI on CKD stage III: Patient's baseline creatinine appears to be around 1.3, currently elevated at 1.79.  Hold ARB's.  Ginger hydration and repeat labs in  AM.  5.  CAD, s/p CABG x5, chronic systolic cardiomyopathy: Patient had echocardiogram in February 2021 revealing EF 40 to 45%, no signs of decompensation currently.  Not on any diuretics at baseline.  Resume home medications.  Hold ARB for permissive hypertension for now.  Resume aspirin, statins.?  Not on beta-blockers.  6.  Chronic alcohol use: Denies any prior history of withdrawals.  Counseled regarding cessation and risks of recurrent strokes/worsening cardiac condition with continued use.  Patient and daughter verbalized understanding.    7.Peripheral neuropathy: Resume home dose of Neurontin.  Walks with a cane at baseline  8.  Diabetes mellitus, insulin dependent-with neuropathy: Patient on weekly Trulicity at home.  Will place on  sliding scale insulin while n.p.o. Will resume Trulicity if able to tolerate p.o. intake.  Monitor blood glucose levels.  9.  Mild leukocytosis: Likely reactive, repeat in a.m.  10.  COPD: Stable.  Resume home therapy.  11. GERD: PPI  DVT prophylaxis: Lovenox  COVID screen: Negative  Code Status: Full code.Health care proxy would be daughter  Patient/Family Communication: Discussed with patient and all questions answered to satisfaction.  Consults called: Neurology Admission status :Patient will be admitted under OBSERVATION status.The patient's presenting symptoms, physical exam findings, and initial radiographic and laboratory data in the context of their medical condition is felt to place them at low risk for further clinical deterioration. Furthermore, it is anticipated that the patient will be medically stable for discharge from the hospital within 2 midnights of hospital stay.      Guilford Shi MD [facility] Pager in [place]  If 7PM-7AM, please contact night-coverage [email or web address]   11/26/2019, 2:35 PM

## 2019-11-26 NOTE — ED Notes (Signed)
Pt has trouble with balance at baseline d/t neuropathy

## 2019-11-26 NOTE — Consult Note (Signed)
Patient name: Kelly Williams MRN: [id number] DOB: 08-Dec-1940 Sex: male  REASON FOR CONSULT: Right brain stroke with prior right carotid endarterectomy  HPI: Kelly Williams is a 79 y.o. male, who began to have some slurred speech and left facial droop somewhere around 24 to 36 hours ago.  The patient's daughter stated that he was intoxicated with alcohol over the weekend and does not know exactly when he started to lose with neurologic function.  He did have a prior stroke about 15 years ago.  This was after coronary artery bypass grafting.  He subsequently had a right carotid endarterectomy about 5 years after that at [facility] in [place].  Since that time he had no other neurologic events.  He currently primarily complains mainly of problems with speech although his daughter has also noted a left facial droop.  He has difficulty walking baseline due to severe peripheral neuropathy.  He walks with a cane when he is in public but is able to walk around the house without difficulty.  He currently does not really complain of weakness numbness or tingling in the upper or lower extremities.  Other medical problems include chronic kidney disease, COPD, diabetes, hyperlipidemia, hypertension all of which have been stable.  Most recent serum creatinine prior to today was 1.3.  Today's creatinine was elevated at 1.7.  He is on a statin and Plavix.  Past Medical History:  Diagnosis Date  . Alcohol abuse   . Alcohol abuse   . Arthritis   . Carotid arterial disease ([place])   . Chronic kidney disease    CKD-patient denies  . COPD (chronic obstructive pulmonary disease) ([facility])    uses inhalers  . Coronary artery disease   . Diabetes mellitus without complication ([place])   . Diabetic retinopathy ([place])   . Hyperlipidemia   . Hypertension   . Normocytic anemia 01/16/2019  . Stroke [facility])    around 2020, 2 after bypass in 2015   Past Surgical History:  Procedure Laterality Date  . APPENDECTOMY     as  teenager  . BACK SURGERY    . CAROTID ENDARTERECTOMY Right   . CORONARY ARTERY BYPASS GRAFT     10-12 years ago in [place] at [place] Right 07/18/2019   Procedure: REVERSE SHOULDER ARTHROPLASTY WITH SUBSCAPULAR REPAIR;  Surgeon: Hiram Gash, MD;  Location: [facility] ORS;  Service: Orthopedics;  Laterality: Right;  [name] VASECTOMY      Family History  Problem Relation Age of Onset  . Hypertension Mother   . Hypertension Father     SOCIAL HISTORY: Social History   Socioeconomic History  . Marital status: Divorced    Spouse name: Not on file  . Number of children: Not on file  . Years of education: Not on file  . Highest education level: Not on file  Occupational History  . Not on file  Tobacco Use  . Smoking status: Former Smoker    Packs/day: 1.00    Years: 10.00    Pack years: 10.00    Types: Cigarettes    Quit date: 10/12/2002    Years since quitting: 17.1  . Smokeless tobacco: Never Used  Vaping Use  . Vaping Use: Never used  Substance and Sexual Activity  . Alcohol use: Not Currently  . Drug use: Never  . Sexual activity: Not Currently    Birth control/protection: Abstinence, Surgical  Other Topics Concern  . Not on file  Social  History Narrative  . Not on file   Social Determinants of Health   Financial Resource Strain:   . Difficulty of Paying Living Expenses:   Food Insecurity:   . Worried About Charity fundraiser in the Last Year:   . Arboriculturist in the Last Year:   Transportation Needs:   . Film/video editor (Medical):   [name] Lack of Transportation (Non-Medical):   Physical Activity:   . Days of Exercise per Week:   . Minutes of Exercise per Session:   Stress:   . Feeling of Stress :   Social Connections:   . Frequency of Communication with Friends and Family:   . Frequency of Social Gatherings with Friends and Family:   . Attends Religious Services:   . Active Member of Clubs or Organizations:   .  Attends Archivist Meetings:   [name] Marital Status:   Intimate Partner Violence:   . Fear of Current or Ex-Partner:   . Emotionally Abused:   [name] Physically Abused:   . Sexually Abused:     Allergies  Allergen Reactions  . Metformin And Related Diarrhea    Current Facility-Administered Medications  Medication Dose Route Frequency Provider Last Rate Last Admin  .  stroke: mapping our early stages of recovery book   Does not apply Once Guilford Shi, MD      . 0.9 %  sodium chloride infusion   Intravenous Continuous Guilford Shi, MD 75 mL/hr at 11/26/19 1521 New Bag at 11/26/19 1521  . acetaminophen (TYLENOL) tablet 1,000 mg  1,000 mg Oral Q6H PRN Guilford Shi, MD      . Derrill Memo ON 11/27/2019] aspirin EC tablet 325 mg  325 mg Oral Daily Marliss Coots, PA-C      . atorvastatin (LIPITOR) tablet 80 mg  80 mg Oral QHS Kamineni, Neelima, MD      . bismuth subsalicylate (PEPTO BISMOL) 262 MG/15ML suspension 30 mL  30 mL Oral Q6H PRN Guilford Shi, MD      . clopidogrel (PLAVIX) tablet 75 mg  75 mg Oral Daily Etta Quill R, PA-C      . enoxaparin (LOVENOX) injection 30 mg  30 mg Subcutaneous Q24H Guilford Shi, MD   30 mg at 11/26/19 1515  . gabapentin (NEURONTIN) capsule 300 mg  300 mg Oral TID Guilford Shi, MD   300 mg at 11/26/19 1515  . insulin aspart (novoLOG) injection 0-15 Units  0-15 Units Subcutaneous TID WC Guilford Shi, MD   2 Units at 11/26/19 1650  . insulin glargine (LANTUS) injection 10 Units  10 Units Subcutaneous QHS Kamineni, Neelima, MD      . ipratropium-albuterol (DUONEB) 0.5-2.5 (3) MG/3ML nebulizer solution 3 mL  3 mL Nebulization Q4H PRN Kamineni, Neelima, MD      . pantoprazole (PROTONIX) EC tablet 40 mg  40 mg Oral Daily Guilford Shi, MD   40 mg at 11/26/19 1515    ROS:   General:  No weight loss, Fever, chills  HEENT: No recent headaches, no nasal bleeding, no visual changes, no sore throat  Neurologic: No dizziness,  blackouts, seizures. No recent symptoms of stroke or mini- stroke. No recent episodes of slurred speech, or temporary blindness.  Cardiac: No recent episodes of chest pain/pressure, no shortness of breath at rest.  + shortness of breath with exertion.  Denies history of atrial fibrillation or irregular heartbeat  Vascular: No history of rest pain in feet.  No history of claudication.  No history of non-healing ulcer, No history of DVT   Pulmonary: No home oxygen, no productive cough, no hemoptysis,  No asthma or wheezing  Musculoskeletal:  [ ]  Arthritis, [ ]  Low back pain,  [ ]  Joint pain  Hematologic:No history of hypercoagulable state.  No history of easy bleeding.  No history of anemia  Gastrointestinal: No hematochezia or melena,  No gastroesophageal reflux, no trouble swallowing  Urinary: [X]  chronic Kidney disease, [ ]  on HD - [ ]  MWF or [ ]  TTHS, [ ]  Burning with urination, [ ]  Frequent urination, [ ]  Difficulty urinating;   Skin: No rashes  Psychological: No history of anxiety,  No history of depression   Physical Examination  Vitals:   11/26/19 1531 11/26/19 1532 11/26/19 1600 11/26/19 1658  BP: (!) 151/77  (!) 140/87 (!) 169/83  Pulse:  59 78 89  Resp: 19 19 18 20   Temp:    98.2 F (36.8 C)  TempSrc:    Oral  SpO2:  96% 98% 99%    There is no height or weight on file to calculate BMI.  General:  Alert and oriented, no acute distress HEENT: Normal, with the exception of mild loss of left nasolabial fold and left facial droop, he has slurred speech Neck: No JVD, well-healed right neck scar consistent with prior carotid endarterectomy Pulmonary: Clear to auscultation bilaterally Cardiac: Regular Rate and Rhythm Extremity Pulses:  2+ radial, brachial pulses bilaterally Musculoskeletal: No deformity or edema  Neurologic: Upper and lower extremity motor 5/5 and symmetric, mild left facial droop, slurred speech  DATA:  Patient had a CT scan of the head and MRI of the  brain today.  This shows acute right frontal and parietal infarcts.  MRA of the neck was also performed which suggested distal internal carotid artery stenosis and "1.1 cm aneurysm"  ASSESSMENT: Acute right brain stroke possibly related to recurrent carotid occlusive disease.  Difficult to know if he actually has a patch aneurysm from his previous carotid endarterectomy has a 1.1 cm is fairly small and it is ill-defined on my review of the images.   PLAN: Continue his Plavix and statin.  We will obtain a carotid duplex scan to further define the stenosis on the right side as well as any other anatomical features.  IV hydration today with the plan to get a CT angiogram of the neck within the next 1 to 2 days to further define the anatomy.   Ruta Hinds, MD [facility] Office: [phone] Pager: [phone]

## 2019-11-26 NOTE — ED Notes (Signed)
Pt in MRI.

## 2019-11-26 NOTE — Plan of Care (Signed)
  Problem: Education: Goal: Knowledge of disease or condition will improve Outcome: Progressing Goal: Knowledge of secondary prevention will improve Outcome: Progressing   Problem: Coping: Goal: Will verbalize positive feelings about self Outcome: Progressing

## 2019-11-26 NOTE — ED Provider Notes (Signed)
[facility] Provider Note   CSN: [id number] Arrival date & time: 11/25/19  2351     History Chief Complaint  Patient presents with  . stroke like symptoms    Kelly MERKEY is a 79 y.o. male.  HPI Patient is here for evaluation of dysarthria.  Dysarthria noted by daughter, 3 days ago and persistent.  Initially she thought he was intoxicated.  Patient has intermittent episodes of binge drinking causing intoxication.  He typically falls when he drinks and apparently fell, injuring his left jaw, 2 days ago.  Yesterday he was able to go to the movies with his daughter, and later in the night came here for persistent dysarthria.  Patient had a prolonged wait in the waiting room prior to being placed in examination/treatment room.  Protocol evaluation done at the time I saw him.  CT did not indicate infarct.  Patient has occasional chronic cough.  He is not a smoker.  He has COPD.  He intermittently takes his medications because "I forget to take them."  No other recent illnesses.  Patient's daughter is a Marine scientist and lives with him.  There are no other known modifying factors.    Past Medical History:  Diagnosis Date  . Alcohol abuse   . Alcohol abuse   . Arthritis   . Carotid arterial disease ([place])   . Chronic kidney disease    CKD-patient denies  . COPD (chronic obstructive pulmonary disease) (HCC)    uses inhalers  . Coronary artery disease   . Diabetes mellitus without complication ([place])   . Diabetic retinopathy ([place])   . Hyperlipidemia   . Hypertension   . Normocytic anemia 01/16/2019  . Stroke [facility])    around 2020, 2 after bypass in 2015    Patient Active Problem List   Diagnosis Date Noted  . Unintentional weight loss 08/24/2019  . History of CVA (cerebrovascular accident) 06/25/2019  . Bruit 06/25/2019  . Dizziness 06/14/2019  . Essential hypertension 06/14/2019  . Injury of left shoulder 05/30/2019  . Rotator cuff tear, right  05/30/2019  . Loose stools 02/04/2019  . Gait disturbance 02/04/2019  . Cervical spondylosis 01/17/2019  . Normocytic anemia 01/16/2019  . Trigger finger, right ring finger 10/27/2018  . Renal insufficiency 06/29/2018  . At moderate risk for fall 06/29/2018  . Ambulates with cane 06/29/2018  . Memory difficulties 06/02/2018  . Diabetic autonomic neuropathy associated with type 2 diabetes mellitus ([place]) 06/02/2018  . Status post lumbar spinal fusion 06/02/2018  . S/P CABG x 5 06/02/2018  . Chronic obstructive pulmonary disease ([place]) 06/02/2018  . Shortness of breath 06/02/2018  . Diabetic nephropathy associated with type 2 diabetes mellitus ([place]) 06/02/2018  . Type 2 diabetes mellitus with diabetic neuropathy, unspecified ([place]) 06/02/2018  . Tachycardia with heart rate 100-120 beats per minute 06/02/2018  . Acromioclavicular arthrosis 06/02/2018  . Diabetic retinopathy ([place])   . Coronary artery disease   . Carotid arterial disease ([place])     Past Surgical History:  Procedure Laterality Date  . APPENDECTOMY     as teenager  . BACK SURGERY    . CAROTID ENDARTERECTOMY Right   . CORONARY ARTERY BYPASS GRAFT     10-12 years ago in [place] at [facility] Right 07/18/2019   Procedure: REVERSE SHOULDER ARTHROPLASTY WITH SUBSCAPULAR REPAIR;  Surgeon: Hiram Gash, MD;  Location: [facility];  Service: Orthopedics;  Laterality: Right;  [name] VASECTOMY  No family history on file.  Social History   Tobacco Use  . Smoking status: Former Smoker    Packs/day: 1.00    Years: 10.00    Pack years: 10.00    Types: Cigarettes    Quit date: 10/12/2002    Years since quitting: 17.1  . Smokeless tobacco: Never Used  Vaping Use  . Vaping Use: Never used  Substance Use Topics  . Alcohol use: Not Currently  . Drug use: Never    Home Medications Prior to Admission medications   Medication Sig Start Date End Date Taking? Authorizing Provider    acetaminophen (TYLENOL) 500 MG tablet Take 1,000 mg by mouth every 6 (six) hours as needed for mild pain.   Yes [provider]  albuterol (PROVENTIL HFA;VENTOLIN HFA) 108 (90 Base) MCG/ACT inhaler Inhale 1-2 puffs into the lungs every 4 (four) hours as needed for wheezing or shortness of breath (bronchospasm). 06/20/18  Yes Trixie Dredge, PA-C  aspirin EC 81 MG tablet Take 1 tablet (81 mg total) by mouth daily. Patient taking differently: Take 81 mg by mouth at bedtime.  06/25/19  Yes Buford Dresser, MD  atorvastatin (LIPITOR) 40 MG tablet Take 1 tablet (40 mg total) by mouth at bedtime. 09/07/19  Yes Luetta Nutting, DO  bismuth subsalicylate (PEPTO BISMOL) 262 MG/15ML suspension Take 30 mLs by mouth every 6 (six) hours as needed for indigestion or diarrhea or loose stools.   Yes [provider]  celecoxib (CELEBREX) 100 MG capsule Take 1 capsule (100 mg total) by mouth 2 (two) times daily as needed for mild pain. Patient taking differently: Take 200 mg by mouth 2 (two) times daily as needed for mild pain.  09/10/19  Yes Luetta Nutting, DO  Dulaglutide (TRULICITY) 3.61 WE/3.1VQ SOPN Inject 0.75 mg into the skin once a week. Patient taking differently: Inject 0.75 mg into the skin once a week. Saturday 09/10/19  Yes Luetta Nutting, DO  gabapentin (NEURONTIN) 300 MG capsule Take 1 capsule (300 mg total) by mouth 3 (three) times daily. 09/07/19  Yes Luetta Nutting, DO  ipratropium-albuterol (DUONEB) 0.5-2.5 (3) MG/3ML SOLN Take 3 mLs by nebulization every 6 (six) hours as needed. Patient taking differently: Take 3 mLs by nebulization every 6 (six) hours as needed (COPD).  10/27/18  Yes Trixie Dredge, PA-C  losartan (COZAAR) 25 MG tablet Take 1 tablet (25 mg total) by mouth every morning. Patient taking differently: Take 25 mg by mouth at bedtime.  06/25/19  Yes Buford Dresser, MD  Alcohol Swabs (B-D SINGLE USE SWABS REGULAR) PADS Use to check glucose  daily Patient taking differently: 1 each by Other route See admin instructions. Use to check glucose daily 09/07/19   Luetta Nutting, DO  Blood Glucose Monitoring Suppl (TRUE METRIX AIR GLUCOSE METER) DEVI Dx DM E11.9 Check fasting blood sugar every morning and once 2 hours after largest meal of the day. Patient taking differently: 1 each by Other route See admin instructions. Dx DM E11.9 Check fasting blood sugar every morning and once 2 hours after largest meal of the day. 09/13/19   Luetta Nutting, DO  glucose blood (TRUE METRIX BLOOD GLUCOSE TEST) test strip Use as instructed Patient taking differently: 1 each by Other route as directed.  09/07/19   Luetta Nutting, DO  omeprazole (PRILOSEC) 20 MG capsule Take 1 capsule (20 mg total) by mouth daily. 07/18/19 08/17/19  Ethelda Chick, PA-C  Respiratory Therapy Supplies (NEBULIZER/TUBING/MOUTHPIECE) KIT Use as directed with nebulizer treatments (Duoneb) every  4 hours prn. Dx: J44.9 Patient taking differently: 1 each by Other route See admin instructions. Use as directed with nebulizer treatments (Duoneb) every 4 hours prn. Dx: J44.9 06/20/18   Trixie Dredge, PA-C  Tiotropium Bromide-Olodaterol (STIOLTO RESPIMAT) 2.5-2.5 MCG/ACT AERS Inhale 2 puffs into the lungs daily. Patient not taking: Reported on 11/26/2019 10/27/18   Trixie Dredge, PA-C  TRUEplus Lancets 30G MISC Use to check glucose daily Patient taking differently: 1 each by Other route daily.  09/07/19   Luetta Nutting, DO    Allergies    Metformin and related  Review of Systems   Review of Systems  All other systems reviewed and are negative.   Physical Exam Updated Vital Signs BP (!) 193/94   Pulse 85   Temp 98.2 F (36.8 C) (Oral)   Resp 18   SpO2 99%   Physical Exam Vitals and nursing note reviewed.  Constitutional:      General: He is not in acute distress.    Appearance: He is well-developed. He is not ill-appearing.  HENT:     Head:  Normocephalic.     Comments: Small contusion left anterior mandible region, no trismus or deformity of the mandible.    Right Ear: External ear normal.     Left Ear: External ear normal.     Nose: Nose normal.     Mouth/Throat:     Mouth: Mucous membranes are moist.     Pharynx: No oropharyngeal exudate.     Comments: No dental injury. Eyes:     Conjunctiva/sclera: Conjunctivae normal.     Pupils: Pupils are equal, round, and reactive to light.  Neck:     Trachea: Phonation normal.  Cardiovascular:     Rate and Rhythm: Normal rate and regular rhythm.     Heart sounds: Normal heart sounds.  Pulmonary:     Effort: Pulmonary effort is normal.     Breath sounds: Normal breath sounds.  Abdominal:     General: There is no distension.     Palpations: Abdomen is soft.     Tenderness: There is no abdominal tenderness.  Musculoskeletal:        General: Normal range of motion.     Cervical back: Normal range of motion and neck supple.  Skin:    General: Skin is warm and dry.  Neurological:     Mental Status: He is alert and oriented to person, place, and time.     Cranial Nerves: No cranial nerve deficit.     Sensory: No sensory deficit.     Motor: No abnormal muscle tone.     Coordination: Coordination normal.     Comments: Dysarthria is present.  No aphasia.  No neglect.  No nystagmus.  No pronator drift or ataxia.  Psychiatric:        Mood and Affect: Mood normal.        Behavior: Behavior normal.        Thought Content: Thought content normal.        Judgment: Judgment normal.     ED Results / Procedures / Treatments   Labs (all labs ordered are listed, but only abnormal results are displayed) Labs Reviewed  CBC - Abnormal; Notable for the following components:      Result Value   WBC 13.9 (*)    RBC 4.14 (*)    Hemoglobin 12.5 (*)    HCT 38.5 (*)    All other components within normal limits  DIFFERENTIAL -  Abnormal; Notable for the following components:   Neutro Abs  9.3 (*)    All other components within normal limits  COMPREHENSIVE METABOLIC PANEL - Abnormal; Notable for the following components:   CO2 21 (*)    Glucose, Bld 117 (*)    BUN 39 (*)    Creatinine, Ser 1.79 (*)    GFR calc non Af Amer 35 (*)    GFR calc Af Amer 41 (*)    All other components within normal limits  URINALYSIS, ROUTINE W REFLEX MICROSCOPIC - Abnormal; Notable for the following components:   Protein, ur 100 (*)    Bacteria, UA MANY (*)    All other components within normal limits  I-STAT CHEM 8, ED - Abnormal; Notable for the following components:   BUN 39 (*)    Creatinine, Ser 1.80 (*)    Glucose, Bld 113 (*)    TCO2 21 (*)    All other components within normal limits  CBG MONITORING, ED - Abnormal; Notable for the following components:   Glucose-Capillary 110 (*)    All other components within normal limits  SARS CORONAVIRUS 2 BY RT PCR (HOSPITAL ORDER, [facility])  URINE CULTURE  PROTIME-INR  APTT  ETHANOL    EKG EKG Interpretation  Date/Time:  Monday November 26 2019 00:05:25 EDT Ventricular Rate:  92 PR Interval:  188 QRS Duration: 80 QT Interval:  356 QTC Calculation: 440 R Axis:   1 Text Interpretation: Normal sinus rhythm Anterior infarct , age undetermined Abnormal ECG No old tracing to compare Confirmed by Daleen Bo [phone]) on 11/26/2019 8:28:37 AM   Radiology CT HEAD WO CONTRAST  Result Date: 11/26/2019 CLINICAL DATA:  Left-sided facial droop with slurred speech EXAM: CT HEAD WITHOUT CONTRAST TECHNIQUE: Contiguous axial images were obtained from the base of the skull through the vertex without intravenous contrast. COMPARISON:  None. FINDINGS: Brain: There is no mass, hemorrhage or extra-axial collection. The size and configuration of the ventricles and extra-axial CSF spaces are normal. There is hypoattenuation of the white matter, most commonly indicating chronic small vessel disease. Old right MCA territory infarct  is unchanged. There are old small vessel infarcts of the left basal ganglia and right thalamus. Vascular: No abnormal hyperdensity of the major intracranial arteries or dural venous sinuses. No intracranial atherosclerosis. Skull: The visualized skull base, calvarium and extracranial soft tissues are normal. Sinuses/Orbits: No fluid levels or advanced mucosal thickening of the visualized paranasal sinuses. No mastoid or middle ear effusion. The orbits are normal. IMPRESSION: 1. No acute intracranial abnormality. 2. Old right MCA territory infarct and chronic small vessel disease. Electronically Signed   By: Ulyses Jarred M.D.   On: 11/26/2019 00:56   MR BRAIN WO CONTRAST  Result Date: 11/26/2019 CLINICAL DATA:  Neuro deficit. EXAM: MRI HEAD WITHOUT CONTRAST TECHNIQUE: Multiplanar, multiecho pulse sequences of the brain and surrounding structures were obtained without intravenous contrast. COMPARISON:  11/26/2019 head CT.  01/16/2019 MRI head. FINDINGS: Please note image quality is mildly degraded by motion artifact. Brain: Focal restricted diffusion involving the right frontal and parietal regions with correlative T2/FLAIR hyperintense signal. Sequela of remote right MCA territory, bilateral occipital and right temporal insults. Remote lacunar infarcts involving the right corona radiata, bilateral basal ganglia, midbrain and right thalamus. Tiny remote left cerebellar infarct. Moderate diffuse parenchymal volume loss with ex vacuo dilatation. Remote tiny right thalamic microhemorrhage. No midline shift, mass lesion or extra-axial fluid collection. Scattered and confluent background white matter  T2/FLAIR hyperintense foci are nonspecific however commonly associated with chronic microvascular ischemic changes. Vascular: Grossly normal flow voids. Tortuous basilar artery and right V4 segment abutting the right anterolateral medulla. Skull and upper cervical spine: Normal marrow signal. Upper cervical spondylosis.  Sinuses/Orbits: Sequela of bilateral lens replacement. Mild ethmoid sinus mucosal thickening. No mastoid effusion. Other: None. IMPRESSION: Acute infarcts involving the right frontal and parietal lobes. Remote infarcts involving the right MCA territory, bilateral occipital, right temporal region and left cerebellum. Remote lacunar infarcts involving the right corona radiata, bilateral basal ganglia, midbrain and right thalamus. Moderate cerebral atrophy. Advanced chronic microvascular ischemic changes. These results were called by telephone at the time of interpretation on 11/26/2019 at 10:21 am to provider [facility] , who verbally acknowledged these results. Electronically Signed   By: Kelly Gauze M.D.   On: 11/26/2019 10:25    Procedures .Critical Care Performed by: Daleen Bo, MD Authorized by: Daleen Bo, MD   Critical care provider statement:    Critical care time (minutes):  40   Critical care start time:  11/26/2019 8:20 AM   Critical care end time:  11/26/2019 11:45 AM   Critical care time was exclusive of:  Separately billable procedures and treating other patients   Critical care was necessary to treat or prevent imminent or life-threatening deterioration of the following conditions:  CNS failure or compromise   Critical care was time spent personally by me on the following activities:  Blood draw for specimens, development of treatment plan with patient or surrogate, discussions with consultants, evaluation of patient's response to treatment, examination of patient, obtaining history from patient or surrogate, ordering and performing treatments and interventions, ordering and review of laboratory studies, pulse oximetry, re-evaluation of patient's condition, review of old charts and ordering and review of radiographic studies   (including critical care time)  Medications Ordered in ED Medications  sodium chloride flush (NS) 0.9 % injection 3 mL (3 mLs Intravenous Given  11/26/19 0846)    ED Course  I have reviewed the triage vital signs and the nursing notes.  Pertinent labs & imaging results that were available during my care of the patient were reviewed by me and considered in my medical decision making (see chart for details).  Clinical Course as of Nov 25 1208  Mon Nov 26, 2019  1101 Case discussed with neurologist, Dr. 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   [EW]  1105 Normal except elevated BUN and creatinine  I-stat chem 8, ED(!) [EW]  1105 Normal except presence of white cells and bacteria.  Urine culture ordered  Urinalysis, Routine w reflex microscopic(!) [EW]  1105 Normal  Ethanol [EW]  1106 Per radiologist no acute abnormality.  CT HEAD WO CONTRAST [EW]  1106 Per radiologist, frontal and parietal strokes, acute  MR BRAIN WO CONTRAST [EW]  1106 Normal except CO2 low, glucose high, BUN high, creatinine high  Comprehensive metabolic panel(!) [EW]    Clinical Course User Index [EW] Daleen Bo, MD   MDM Rules/Calculators/A&P                           Patient Vitals for the past 24 hrs:  BP Temp Temp src Pulse Resp SpO2  11/26/19 0900 (!) 193/94 -- -- 85 18 99 %  11/26/19 0845 (!) 160/101 -- -- 80 17 99 %  11/26/19 0830 (!) 178/85 -- -- 82 19 100 %  11/26/19 0815 (!) 189/81 -- --  82 19 99 %  11/26/19 0800 (!) 192/94 -- -- 87 18 100 %  11/26/19 0745 (!) 177/95 -- -- 84 17 100 %  11/26/19 0402 119/76 98.2 F (36.8 C) Oral 77 16 99 %  11/26/19 0008 (!) 167/90 97.7 F (36.5 C) -- 91 17 100 %    12:10 PM Reevaluation with update and discussion. After initial assessment and treatment, an updated evaluation reveals no change in critical status he is eating easily without choking.  Findings discussed with patient and daughter, all questions answered. Daleen Bo   Medical Decision Making:  This patient is presenting for evaluation of dysarthria, which does require a range of treatment options, and is a complaint  that involves a moderate risk of morbidity and mortality. The differential diagnoses include CVA, intoxication, traumatic injury. I decided to review old records, and in summary elderly male, noncompliant with medications, with dysarthria for 3 days.  No prior stroke.  Suspected alcoholic intake by daughter..  I obtained additional historical information from daughter at bedside..  Clinical Laboratory Tests Ordered, included CBC, Metabolic panel, Urinalysis and Urine culture. Review indicates renal insufficiency, mild AKI, hyperglycemia, abnormal urinalysis. Radiologic Tests Ordered, included CT brain, MRI brain.  I independently Visualized: Radiographic images, which show CT negative, MRI with acute strokes  Cardiac Monitor Tracing which shows normal sinus rhythm    Critical Interventions-patient presented with dysarthria facial droop, with findings for acute stroke, not meeting criteria for intervention.  He requires hospitalization.  He will need to be restratified and treated for stroke.  After These Interventions, the Patient was reevaluated and was found stable requiring hospitalization for a stratification and initiation of cardiology and nephrology evaluations.  CRITICAL CARE-yes Performed by: Daleen Bo  Nursing Notes Reviewed/ Care Coordinated Applicable Imaging Reviewed Interpretation of Laboratory Data incorporated into ED treatment   11:10 AM-Consult complete with hospitalist. Patient case explained and discussed.  She agrees to admit patient for further evaluation and treatment. Call ended at 11:19 AM  Plan: Admit    Final Clinical Impression(s) / ED Diagnoses Final diagnoses:  None    Rx / DC Orders ED Discharge Orders    None       Daleen Bo, MD 11/26/19 1210

## 2019-11-27 ENCOUNTER — Encounter (HOSPITAL_COMMUNITY): Payer: Self-pay | Admitting: Radiology

## 2019-11-27 ENCOUNTER — Observation Stay (HOSPITAL_COMMUNITY): Payer: Medicare HMO

## 2019-11-27 ENCOUNTER — Observation Stay (HOSPITAL_BASED_OUTPATIENT_CLINIC_OR_DEPARTMENT_OTHER): Payer: Medicare HMO

## 2019-11-27 DIAGNOSIS — N1831 Chronic kidney disease, stage 3a: Secondary | ICD-10-CM | POA: Diagnosis present

## 2019-11-27 DIAGNOSIS — F10129 Alcohol abuse with intoxication, unspecified: Secondary | ICD-10-CM

## 2019-11-27 DIAGNOSIS — E1122 Type 2 diabetes mellitus with diabetic chronic kidney disease: Secondary | ICD-10-CM | POA: Diagnosis present

## 2019-11-27 DIAGNOSIS — I429 Cardiomyopathy, unspecified: Secondary | ICD-10-CM | POA: Diagnosis present

## 2019-11-27 DIAGNOSIS — J449 Chronic obstructive pulmonary disease, unspecified: Secondary | ICD-10-CM | POA: Diagnosis present

## 2019-11-27 DIAGNOSIS — I341 Nonrheumatic mitral (valve) prolapse: Secondary | ICD-10-CM

## 2019-11-27 DIAGNOSIS — I639 Cerebral infarction, unspecified: Secondary | ICD-10-CM | POA: Diagnosis present

## 2019-11-27 DIAGNOSIS — Z7982 Long term (current) use of aspirin: Secondary | ICD-10-CM | POA: Diagnosis not present

## 2019-11-27 DIAGNOSIS — Z888 Allergy status to other drugs, medicaments and biological substances status: Secondary | ICD-10-CM | POA: Diagnosis not present

## 2019-11-27 DIAGNOSIS — I2510 Atherosclerotic heart disease of native coronary artery without angina pectoris: Secondary | ICD-10-CM | POA: Diagnosis present

## 2019-11-27 DIAGNOSIS — W19XXXA Unspecified fall, initial encounter: Secondary | ICD-10-CM | POA: Diagnosis present

## 2019-11-27 DIAGNOSIS — Z794 Long term (current) use of insulin: Secondary | ICD-10-CM | POA: Diagnosis not present

## 2019-11-27 DIAGNOSIS — F10229 Alcohol dependence with intoxication, unspecified: Secondary | ICD-10-CM | POA: Diagnosis present

## 2019-11-27 DIAGNOSIS — R471 Dysarthria and anarthria: Secondary | ICD-10-CM | POA: Diagnosis present

## 2019-11-27 DIAGNOSIS — E785 Hyperlipidemia, unspecified: Secondary | ICD-10-CM | POA: Diagnosis present

## 2019-11-27 DIAGNOSIS — E11319 Type 2 diabetes mellitus with unspecified diabetic retinopathy without macular edema: Secondary | ICD-10-CM | POA: Diagnosis present

## 2019-11-27 DIAGNOSIS — E1140 Type 2 diabetes mellitus with diabetic neuropathy, unspecified: Secondary | ICD-10-CM | POA: Diagnosis not present

## 2019-11-27 DIAGNOSIS — Z79899 Other long term (current) drug therapy: Secondary | ICD-10-CM | POA: Diagnosis not present

## 2019-11-27 DIAGNOSIS — Z20822 Contact with and (suspected) exposure to covid-19: Secondary | ICD-10-CM | POA: Diagnosis present

## 2019-11-27 DIAGNOSIS — Z951 Presence of aortocoronary bypass graft: Secondary | ICD-10-CM | POA: Diagnosis not present

## 2019-11-27 DIAGNOSIS — Z9114 Patient's other noncompliance with medication regimen: Secondary | ICD-10-CM | POA: Diagnosis not present

## 2019-11-27 DIAGNOSIS — I10 Essential (primary) hypertension: Secondary | ICD-10-CM | POA: Diagnosis not present

## 2019-11-27 DIAGNOSIS — I6523 Occlusion and stenosis of bilateral carotid arteries: Secondary | ICD-10-CM | POA: Diagnosis not present

## 2019-11-27 DIAGNOSIS — I63231 Cerebral infarction due to unspecified occlusion or stenosis of right carotid arteries: Secondary | ICD-10-CM

## 2019-11-27 DIAGNOSIS — I720 Aneurysm of carotid artery: Secondary | ICD-10-CM | POA: Diagnosis present

## 2019-11-27 DIAGNOSIS — D72829 Elevated white blood cell count, unspecified: Secondary | ICD-10-CM | POA: Diagnosis present

## 2019-11-27 DIAGNOSIS — N179 Acute kidney failure, unspecified: Secondary | ICD-10-CM | POA: Diagnosis present

## 2019-11-27 DIAGNOSIS — Z8673 Personal history of transient ischemic attack (TIA), and cerebral infarction without residual deficits: Secondary | ICD-10-CM | POA: Diagnosis not present

## 2019-11-27 DIAGNOSIS — I6521 Occlusion and stenosis of right carotid artery: Secondary | ICD-10-CM | POA: Diagnosis not present

## 2019-11-27 DIAGNOSIS — I129 Hypertensive chronic kidney disease with stage 1 through stage 4 chronic kidney disease, or unspecified chronic kidney disease: Secondary | ICD-10-CM | POA: Diagnosis present

## 2019-11-27 DIAGNOSIS — K219 Gastro-esophageal reflux disease without esophagitis: Secondary | ICD-10-CM | POA: Diagnosis present

## 2019-11-27 DIAGNOSIS — Z96611 Presence of right artificial shoulder joint: Secondary | ICD-10-CM | POA: Diagnosis present

## 2019-11-27 HISTORY — PX: IR PATIENT EVAL TECH 0-60 MINS: IMG5564

## 2019-11-27 LAB — CBC
HCT: 34.1 % — ABNORMAL LOW (ref 39.0–52.0)
Hemoglobin: 11.1 g/dL — ABNORMAL LOW (ref 13.0–17.0)
MCH: 30.2 pg (ref 26.0–34.0)
MCHC: 32.6 g/dL (ref 30.0–36.0)
MCV: 92.9 fL (ref 80.0–100.0)
Platelets: 276 10*3/uL (ref 150–400)
RBC: 3.67 MIL/uL — ABNORMAL LOW (ref 4.22–5.81)
RDW: 13.2 % (ref 11.5–15.5)
WBC: 11.6 10*3/uL — ABNORMAL HIGH (ref 4.0–10.5)
nRBC: 0 % (ref 0.0–0.2)

## 2019-11-27 LAB — BASIC METABOLIC PANEL
Anion gap: 6 (ref 5–15)
BUN: 34 mg/dL — ABNORMAL HIGH (ref 8–23)
CO2: 22 mmol/L (ref 22–32)
Calcium: 8.5 mg/dL — ABNORMAL LOW (ref 8.9–10.3)
Chloride: 113 mmol/L — ABNORMAL HIGH (ref 98–111)
Creatinine, Ser: 1.5 mg/dL — ABNORMAL HIGH (ref 0.61–1.24)
GFR calc Af Amer: 51 mL/min — ABNORMAL LOW (ref >60)
GFR calc non Af Amer: 44 mL/min — ABNORMAL LOW (ref >60)
Glucose, Bld: 130 mg/dL — ABNORMAL HIGH (ref 70–99)
Potassium: 4.2 mmol/L (ref 3.5–5.1)
Sodium: 141 mmol/L (ref 135–145)

## 2019-11-27 LAB — LIPID PANEL
Cholesterol: 131 mg/dL (ref 0–200)
HDL: 40 mg/dL — ABNORMAL LOW (ref >40)
LDL Cholesterol: 76 mg/dL (ref 0–99)
Total CHOL/HDL Ratio: 3.3 RATIO
Triglycerides: 75 mg/dL (ref <150)
VLDL: 15 mg/dL (ref 0–40)

## 2019-11-27 LAB — ECHOCARDIOGRAM COMPLETE: Area-P 1/2: 4.06 cm2

## 2019-11-27 LAB — GLUCOSE, CAPILLARY
Glucose-Capillary: 102 mg/dL — ABNORMAL HIGH (ref 70–99)
Glucose-Capillary: 136 mg/dL — ABNORMAL HIGH (ref 70–99)
Glucose-Capillary: 152 mg/dL — ABNORMAL HIGH (ref 70–99)
Glucose-Capillary: 201 mg/dL — ABNORMAL HIGH (ref 70–99)

## 2019-11-27 LAB — HEMOGLOBIN A1C
Hgb A1c MFr Bld: 6.3 % — ABNORMAL HIGH (ref 4.8–5.6)
Mean Plasma Glucose: 134.11 mg/dL

## 2019-11-27 MED ORDER — HEPARIN SODIUM (PORCINE) 1000 UNIT/ML IJ SOLN
INTRAMUSCULAR | Status: AC
  Filled 2019-11-27: qty 1

## 2019-11-27 MED ORDER — SODIUM CHLORIDE 0.9 % IV BOLUS
INTRAVENOUS | Status: AC | PRN
  Administered 2019-11-27: 250 mL via INTRAVENOUS

## 2019-11-27 MED ORDER — FENTANYL CITRATE (PF) 100 MCG/2ML IJ SOLN
INTRAMUSCULAR | Status: AC
  Filled 2019-11-27: qty 2

## 2019-11-27 MED ORDER — NITROGLYCERIN 1 MG/10 ML FOR IR/CATH LAB
INTRA_ARTERIAL | Status: AC
  Filled 2019-11-27: qty 10

## 2019-11-27 MED ORDER — VERAPAMIL HCL 2.5 MG/ML IV SOLN
INTRAVENOUS | Status: AC
  Filled 2019-11-27: qty 2

## 2019-11-27 MED ORDER — MIDAZOLAM HCL 2 MG/2ML IJ SOLN
INTRAMUSCULAR | Status: AC
  Filled 2019-11-27: qty 2

## 2019-11-27 MED ORDER — LIDOCAINE HCL 1 % IJ SOLN
INTRAMUSCULAR | Status: AC
  Filled 2019-11-27: qty 20

## 2019-11-27 NOTE — Progress Notes (Signed)
[facility]  Subjective  - feels better   Objective (!) 173/86 79 97.8 F (36.6 C) (Oral) 20 100%  Intake/Output Summary (Last 24 hours) at 11/27/2019 1306 Last data filed at 11/27/2019 0327 Gross per 24 hour  Intake 1054 ml  Output 100 ml  Net 954 ml   Speech slightly improved, facial droop persists  Assessment/Planning: Right brain stroke Carotid duplex 40-60% right ICA, 60-80% left ICA antegrade verts No aneurysm noted on duplex  Will leave at discretion of Neuro service carotid angio vs CTA Creatinine 1.5 today near baseline  Ruta Hinds 11/27/2019 1:06 PM --  Laboratory Lab Results: Recent Labs    11/26/19 0004 11/26/19 0004 11/26/19 0034 11/27/19 0313  WBC 13.9*  --   --  11.6*  HGB 12.5*   < > 13.3 11.1*  HCT 38.5*   < > 39.0 34.1*  PLT 315  --   --  276   < > = values in this interval not displayed.   BMET Recent Labs    11/26/19 0004 11/26/19 0004 11/26/19 0034 11/27/19 0313  NA 138   < > 142 141  K 4.3   < > 4.4 4.2  CL 107   < > 108 113*  CO2 21*  --   --  22  GLUCOSE 117*   < > 113* 130*  BUN 39*   < > 39* 34*  CREATININE 1.79*   < > 1.80* 1.50*  CALCIUM 9.3  --   --  8.5*   < > = values in this interval not displayed.    COAG Lab Results  Component Value Date   INR 1.0 11/26/2019   No results found for: PTT

## 2019-11-27 NOTE — Progress Notes (Signed)
Patient ID: Kelly Williams, male   DOB: 08-Mar-1941, 79 y.o.   MRN: [id number] INR. Discussed with Dr Oneida Alar and Dr Cheral Marker. Will defer diagnostic arteriogram until patients kidney functions improve.. Informed patient regarding change in plan for now.  S.Reigan Tolliver MD

## 2019-11-27 NOTE — Evaluation (Signed)
Speech Language Pathology Evaluation Patient Details Name: Kelly Williams MRN: [id number] DOB: [birth year] Today's Date: 11/27/2019 Time: 0922-0949 SLP Time Calculation (min) (ACUTE ONLY): 27 min  Problem List:  Patient Active Problem List   Diagnosis Date Noted  . CVA (cerebral vascular accident) ([place]) 11/26/2019  . Unintentional weight loss 08/24/2019  . History of CVA (cerebrovascular accident) 06/25/2019  . Bruit 06/25/2019  . Dizziness 06/14/2019  . Essential hypertension 06/14/2019  . Injury of left shoulder 05/30/2019  . Rotator cuff tear, right 05/30/2019  . Loose stools 02/04/2019  . Gait disturbance 02/04/2019  . Cervical spondylosis 01/17/2019  . Normocytic anemia 01/16/2019  . Trigger finger, right ring finger 10/27/2018  . Renal insufficiency 06/29/2018  . At moderate risk for fall 06/29/2018  . Ambulates with cane 06/29/2018  . Memory difficulties 06/02/2018  . Diabetic autonomic neuropathy associated with type 2 diabetes mellitus ([place]) 06/02/2018  . Status post lumbar spinal fusion 06/02/2018  . S/P CABG x 5 06/02/2018  . Chronic obstructive pulmonary disease ([place]) 06/02/2018  . Shortness of breath 06/02/2018  . Diabetic nephropathy associated with type 2 diabetes mellitus ([place]) 06/02/2018  . Type 2 diabetes mellitus with diabetic neuropathy, unspecified ([place]) 06/02/2018  . Tachycardia with heart rate 100-120 beats per minute 06/02/2018  . Acromioclavicular arthrosis 06/02/2018  . Diabetic retinopathy ([place])   . Coronary artery disease   . Carotid arterial disease ([facility])    Past Medical History:  Past Medical History:  Diagnosis Date  . Alcohol abuse   . Alcohol abuse   . Arthritis   . Carotid arterial disease ([place])   . Chronic kidney disease    CKD-patient denies  . COPD (chronic obstructive pulmonary disease) ([facility])    uses inhalers  . Coronary artery disease   . Diabetes mellitus without complication ([place])   . Diabetic retinopathy ([place])   .  Hyperlipidemia   . Hypertension   . Normocytic anemia 01/16/2019  . Stroke [facility])    around 2020, 2 after bypass in 2015   Past Surgical History:  Past Surgical History:  Procedure Laterality Date  . APPENDECTOMY     as teenager  . BACK SURGERY    . CAROTID ENDARTERECTOMY Right   . CORONARY ARTERY BYPASS GRAFT     10-12 years ago in [place] at [place] 0-60 MINS  11/27/2019  . REVERSE SHOULDER ARTHROPLASTY Right 07/18/2019   Procedure: REVERSE SHOULDER ARTHROPLASTY WITH SUBSCAPULAR REPAIR;  Surgeon: Hiram Gash, MD;  Location: [facility] ORS;  Service: Orthopedics;  Laterality: Right;  [name] VASECTOMY     HPI:  Kelly Williams is a 79 y.o. male with history h/o hypertension, hyperlipidemia, DM, COPD, CKD, chronic alcohol abuse, CAD s/p CABG, post CABG CVA in 2020 presents now with complaints of dysarthria as noted by daughter 3 days prior to admission. Head MRI revealed Acute infarcts involving the right frontal and parietal lobes.   Assessment / Plan / Recommendation Clinical Impression  Kelly Williams demonstrates a mild-moderate dysarthria most consistent with flaccid subtype. Oral mech exam was slow, but revealed good symmetry. Mildly reduced strength is suspected. Pt immediately confirmed speech changes described as, "trouble getting my words out," and confirmed chronic cognitive difficulty with memory. He reports that he forgets to take his medicine and doesn't want to burden his daughter with the task. He was recommended to use an alarm to remind him to take his meds. Given complex problem solving calculations and med mgmt questions,  pt had 100% acc'y, but memory tested poorly (6/10 for word recall and 2/8 for paragraph recall). Speech is largely intelligible, but very slowed/unnatural and effortful. He was interested in speech therapy to return to his baseline performance. He appeared motivated and a good candidate for speech strategies/treatment on acute level. Should  pt discharge with speech deficits ongoing, recommend outpatient therapy for compensation of speech and memory.    SLP Assessment  SLP Recommendation/Assessment: Patient needs continued Speech Lanaguage Pathology Services SLP Visit Diagnosis: Dysarthria and anarthria (R47.1);Cognitive communication deficit (R41.841)    Follow Up Recommendations  Outpatient SLP    Frequency and Duration min 1 x/week  1 week      SLP Evaluation Cognition  Overall Cognitive Status: (P) No family/caregiver present to determine baseline cognitive functioning Arousal/Alertness: Awake/alert Orientation Level: Oriented X4 Attention: Focused Memory: Impaired Memory Impairment: Retrieval deficit;Decreased short term memory Decreased Short Term Memory: Verbal complex;Functional basic;Functional complex;Verbal basic Awareness: Appears intact Problem Solving: Appears intact Executive Function: Decision Making;Organizing;Self Correcting Organizing: Appears intact Decision Making: Appears intact Self Correcting: Appears intact Safety/Judgment: Appears intact       Comprehension  Auditory Comprehension Overall Auditory Comprehension: Appears within functional limits for tasks assessed Yes/No Questions: Within Functional Limits Commands: Within Functional Limits Conversation: Simple    Expression Expression Primary Mode of Expression: Verbal Verbal Expression Overall Verbal Expression: Appears within functional limits for tasks assessed Repetition: No impairment Naming: No impairment Written Expression Dominant Hand: (P) Right   Oral / Motor  Oral Motor/Sensory Function Overall Oral Motor/Sensory Function: Within functional limits Motor Speech Overall Motor Speech: Appears within functional limits for tasks assessed Respiration: Impaired Level of Impairment: Sentence Phonation: Normal Resonance: Within functional limits Articulation: Impaired Level of Impairment: Phrase Intelligibility:  Intelligible Motor Planning: Witnin functional limits Motor Speech Errors: Not applicable Effective Techniques: Pacing                    Collette Pescador P. Augusten Lipkin, M.S., CCC-SLP Speech-Language Pathologist Acute Rehabilitation Services Pager: [place] 11/27/2019, 10:38 AM

## 2019-11-27 NOTE — Progress Notes (Signed)
Carotid duplex bilateral study completed.   See Cv Proc for preliminary results.   Kelly Williams  

## 2019-11-27 NOTE — Sedation Documentation (Signed)
Cerebral Angiogram procedure cancelled by Dr. Marjory Lies. Patient informed by MD that IV hydration will be need for 24 hours prior to procedure due to elevated creatinine level. Patient states understanding without any further questions. Patient nurse on [address] informed of cancellation and plan.

## 2019-11-27 NOTE — CV Procedure (Signed)
Echocardiogram not completed patient undergoing other therapies.  Darlina Sicilian RDCS

## 2019-11-27 NOTE — Evaluation (Signed)
Occupational Therapy Evaluation Patient Details Name: Kelly Williams MRN: [id number] DOB: 1940-11-29 Today's Date: 11/27/2019    History of Present Illness Pt is a 79 y/o male with PMH of stroke, HTN, DM, periphreal neuropathy, carotid disease with L carotid endarterectomy, CABG x 5, R shoulder arthroplasthy, medication noncompliance and alcohol abuse. Daughter found patient after falling and intoxicated, L facial droop, slurring speech and aggression (reports hx of mulitple falls). MRI reveals acute ischemic infarcts of R frontal and parietal lobes. MRA of neck revealed focal stenosis in the right internal carotid artery approximately 3 cm distal to the bifurcation with an adjacent 11 mm aneurysmal dilatation.     Clinical Impression   PTA patient modified independent with basic ADLs using cane vs rollator as needed, reports frequent falls and has support from daughter at nighttime (she works during the day) who assist with IADLs- but pt does drive.  Admitted for above and limited by problem list below, including impaired balance, decreased activity tolerance, generalized weakness, slurred speech, decreased cognition, and decreased safety awareness.  He follows commands with increased time, poor awareness to safety and need for increased assistance and presents with decreased STM and attention to task requiring redirection during dual cognitive task or loosing balance when walking and talking. He requires min guard for transfers, min-mod assist for mobility (with several LOB, one total LOB when turning), and min assist for ADLs.  He will benefit from continued OT services while admitted and after dc at [facility] level, given 24/7 assistance for safety and mobility-- if this is not available, may need to look into SNF.     Follow Up Recommendations  Home health OT;Supervision/Assistance - 24 hour;SNF (SNF if doesn't have 24/7 )    Equipment Recommendations  3 in 1 bedside commode    Recommendations for  Other Services       Precautions / Restrictions Precautions Precautions: Fall Precaution Comments: CIWA  Restrictions Weight Bearing Restrictions: No      Mobility Bed Mobility Overal bed mobility: Needs Assistance Bed Mobility: Supine to Sit     Supine to sit: Supervision     General bed mobility comments: for safety, line mgmt   Transfers Overall transfer level: Needs assistance Equipment used: Rolling walker (2 wheeled) Transfers: Sit to/from Stand Sit to Stand: Min guard         General transfer comment: min guard for safety and balance    Balance Overall balance assessment: Needs assistance Sitting-balance support: No upper extremity supported;Feet supported Sitting balance-Leahy Scale: Fair     Standing balance support: Single extremity supported;Bilateral upper extremity supported;During functional activity Standing balance-Leahy Scale: Poor Standing balance comment: relies on UE support, several LOB dynamically                            ADL either performed or assessed with clinical judgement   ADL Overall ADL's : Needs assistance/impaired     Grooming: Minimal assistance;Standing;Wash/dry hands   Upper Body Bathing: Set up;Sitting   Lower Body Bathing: Minimal assistance;Sit to/from stand   Upper Body Dressing : Minimal assistance;Sitting   Lower Body Dressing: Minimal assistance;Sit to/from stand   Toilet Transfer: Minimal assistance;Ambulation;RW;Regular Toilet;Grab bars   Toileting- Clothing Manipulation and Hygiene: Minimal assistance;Sit to/from stand       Functional mobility during ADLs: Minimal assistance;Moderate assistance;Cueing for safety;Rolling walker General ADL Comments: patient limited by impaired balance and decreased activity tolerance, as well as cognition (but anticipate this  is close to baseline)      Vision Baseline Vision/History: Wears glasses Wears Glasses: At all times Patient Visual Report:  Blurring of vision Additional Comments: further assessment required- reports blurry vision but able to locate items at sink and read room number without assist, slight undershooting with finger to nose testing but denies diplopia      Perception     Praxis      Pertinent Vitals/Pain Pain Assessment: No/denies pain     Hand Dominance Right   Extremity/Trunk Assessment Upper Extremity Assessment Upper Extremity Assessment: Generalized weakness (hx of R shoulder surgery and weakness with flexion )   Lower Extremity Assessment Lower Extremity Assessment: Defer to PT evaluation (hx of peripheral neuropathy )       Communication Communication Communication: Expressive difficulties (slurred speech )   Cognition Arousal/Alertness: Awake/alert Behavior During Therapy: WFL for tasks assessed/performed Overall Cognitive Status: No family/caregiver present to determine baseline cognitive functioning Area of Impairment: Attention;Memory;Following commands;Safety/judgement;Awareness;Problem solving                   Current Attention Level: Sustained Memory: Decreased short-term memory Following Commands: Follows one step commands consistently;Follows multi-step commands inconsistently;Follows multi-step commands with increased time Safety/Judgement: Decreased awareness of safety Awareness: Emergent Problem Solving: Slow processing;Decreased initiation;Difficulty sequencing;Requires verbal cues General Comments: pt with decreased attention to task, awareness of safety/need for increased assist. easily distracted, unable to complete dual processing tasks without redirection or loosing balance --anticipate near baseline    General Comments  pt with functional mobiilty and several LOB with 1 HHA simulating cane, pt with poor walker mgmt; patient with complete LOB when turning to sit in chair     Exercises     Shoulder Instructions      Home Living Family/patient expects to be  discharged to:: Private residence Living Arrangements: Children Available Help at Discharge: Family;Available PRN/intermittently Type of Home: Apartment Home Access: Stairs to enter Entrance Stairs-Number of Steps: 2 flights Entrance Stairs-Rails: Right Home Layout: One level     Bathroom Shower/Tub: Teacher, early years/pre: Standard     Home Equipment: Environmental consultant - 4 wheels;Cane - single point;Shower seat   Additional Comments: daugher works during the day, pt alone 7am -5pm  Lives With: Daughter    Prior Functioning/Environment Level of Independence: Independent with assistive device(s)        Comments: initally reports using rollator for all mobility, then reports using cane more than rollator. Falls a lot at home.         OT Problem List: Decreased strength;Decreased activity tolerance;Impaired balance (sitting and/or standing);Impaired vision/perception;Decreased coordination;Decreased cognition;Decreased knowledge of use of DME or AE;Decreased safety awareness;Decreased knowledge of precautions      OT Treatment/Interventions: Self-care/ADL training;Therapeutic exercise;DME and/or AE instruction;Therapeutic activities;Cognitive remediation/compensation;Patient/family education;Balance training    OT Goals(Current goals can be found in the care plan section) Acute Rehab OT Goals Patient Stated Goal: to get back home  OT Goal Formulation: With patient Time For Goal Achievement: 12/11/19 Potential to Achieve Goals: Good  OT Frequency: Min 2X/week   Barriers to D/C:            Co-evaluation              AM-PAC OT "6 Clicks" Daily Activity     Outcome Measure Help from another person eating meals?: Total (npo) Help from another person taking care of personal grooming?: A Little Help from another person toileting, which includes using toliet, bedpan, or urinal?: A Little  Help from another person bathing (including washing, rinsing, drying)?: A  Little Help from another person to put on and taking off regular upper body clothing?: A Little Help from another person to put on and taking off regular lower body clothing?: A Little 6 Click Score: 16   End of Session Equipment Utilized During Treatment: Gait belt;Rolling walker Nurse Communication: Mobility status;Precautions  Activity Tolerance: Patient tolerated treatment well Patient left: in chair;with call bell/phone within reach;with chair alarm set  OT Visit Diagnosis: Unsteadiness on feet (R26.81);Repeated falls (R29.6);Muscle weakness (generalized) (M62.81);History of falling (Z91.81);Other symptoms and signs involving cognitive function                Time: 0953-1005 OT Time Calculation (min): 12 min Charges:  OT General Charges $OT Visit: 1 Visit OT Evaluation $OT Eval Moderate Complexity: 1 Mod  Jolaine Artist, OT Acute Rehabilitation Services Pager [phone] Office [phone]   Delight Stare 11/27/2019, 12:04 PM

## 2019-11-27 NOTE — Procedures (Addendum)
Case could not be attempted due kidney function and conflicting referrals with vascular surgery.

## 2019-11-27 NOTE — Evaluation (Signed)
Physical Therapy Evaluation Patient Details Name: Kelly Williams MRN: [id number] DOB: Aug 08, 1940 Today's Date: 11/27/2019   History of Present Illness  Pt is a 79 y/o male with PMH of stroke, HTN, DM, periphreal neuropathy, carotid disease with L carotid endarterectomy, CABG x 5, R shoulder arthroplasthy, medication noncompliance and alcohol abuse. Daughter found patient after falling and intoxicated, L facial droop, slurring speech and aggression (reports hx of mulitple falls). MRI reveals acute ischemic infarcts of R frontal and parietal lobes. MRA of neck revealed focal stenosis in the right internal carotid artery approximately 3 cm distal to the bifurcation with an adjacent 11 mm aneurysmal dilatation.    Clinical Impression  Patient presents with slurred speech, impaired balance, decreased sensation (premorbidly), impaired cognition and impaired mobility s/p above. Pt lives at home with daughter who works as a Marine scientist, so pt is home alone most of the day. Reports 1 dozen falls per week. Reports using SPC vs rollator for mobility. Pt with poor safety awareness and ability to sustain attention to task. Difficulty with dual tasking during gait training resulting in more LOB. Today, pt requires Min-mod A for gait training with HHA to simulate cane with a few instances of LOB especially when fatigued. High fall risk already due to neuropathy and poor proprioception in BLEs. 1 complete LOB when turning. Would benefit from having more support at home and supervision for OOB. If family not able to provide this, recommend SNF to maximize independence and mobility. Will follow acutely.    Follow Up Recommendations Home health PT;Supervision for mobility/OOB;Supervision/Assistance - 24 hour    Equipment Recommendations  None recommended by PT    Recommendations for Other Services       Precautions / Restrictions Precautions Precautions: Fall Precaution Comments: CIWA  Restrictions Weight Bearing  Restrictions: No      Mobility  Bed Mobility Overal bed mobility: Needs Assistance Bed Mobility: Supine to Sit     Supine to sit: Supervision;HOB elevated     General bed mobility comments: for safety, line mgmt   Transfers Overall transfer level: Needs assistance Equipment used: Rolling walker (2 wheeled) Transfers: Sit to/from Stand Sit to Stand: Min guard         General transfer comment: min guard for safety and balance. Stood from [employer], transferred to chair post ambulation.  Ambulation/Gait Ambulation/Gait assistance: Mod assist;Min assist Gait Distance (Feet): 225 Feet Assistive device: 1 person hand held assist Gait Pattern/deviations: Step-through pattern;Decreased stride length;Staggering right;Staggering left;Leaning posteriorly;Narrow base of support;Drifts right/left Gait velocity: decreased Gait velocity interpretation: <1.8 ft/sec, indicate of risk for recurrent falls General Gait Details: Slow, unsteady steppage like gait due to neuropathy with a few instances of posterior LOB esp when fatigued, HHA to simulate use of SPC as pt has difficulty with RW navigation.  Stairs            Wheelchair Mobility    Modified Rankin (Stroke Patients Only) Modified Rankin (Stroke Patients Only) Pre-Morbid Rankin Score: Moderate disability Modified Rankin: Moderately severe disability     Balance Overall balance assessment: Needs assistance Sitting-balance support: No upper extremity supported;Feet supported Sitting balance-Leahy Scale: Fair     Standing balance support: Single extremity supported;Bilateral upper extremity supported;During functional activity Standing balance-Leahy Scale: Poor Standing balance comment: relies on UE support, several LOB dynamically                              Pertinent Vitals/Pain Pain Assessment:  No/denies pain    Home Living Family/patient expects to be discharged to:: Private residence Living  Arrangements: Children Available Help at Discharge: Family;Available PRN/intermittently Type of Home: Apartment Home Access: Stairs to enter Entrance Stairs-Rails: Right Entrance Stairs-Number of Steps: 2 flights Home Layout: One level Home Equipment: [place] - 4 wheels;Cane - single point;Shower seat Additional Comments: daugher works during the day, pt alone 7am -5pm    Prior Function Level of Independence: Independent with assistive device(s)         Comments: initally reports using rollator for all mobility, then reports using cane more than rollator. Falls a lot at home.      Hand Dominance   Dominant Hand: Right    Extremity/Trunk Assessment   Upper Extremity Assessment Upper Extremity Assessment: Defer to OT evaluation    Lower Extremity Assessment Lower Extremity Assessment: RLE deficits/detail;LLE deficits/detail;Generalized weakness RLE Deficits / Details: decreased sensation mid shin down into foot RLE Sensation: decreased proprioception;history of peripheral neuropathy LLE Deficits / Details: decreased sensation mid shin down into foot LLE Sensation: decreased proprioception;history of peripheral neuropathy       Communication   Communication: Expressive difficulties (slurred speech)  Cognition Arousal/Alertness: Awake/alert Behavior During Therapy: WFL for tasks assessed/performed Overall Cognitive Status: No family/caregiver present to determine baseline cognitive functioning Area of Impairment: Attention;Memory;Following commands;Safety/judgement;Awareness;Problem solving                   Current Attention Level: Sustained Memory: Decreased short-term memory Following Commands: Follows one step commands consistently;Follows multi-step commands inconsistently;Follows multi-step commands with increased time Safety/Judgement: Decreased awareness of safety Awareness: Emergent Problem Solving: Slow processing;Decreased initiation;Difficulty  sequencing;Requires verbal cues General Comments: pt with decreased attention to task, awareness of safety/need for increased assist. easily distracted, unable to complete dual processing tasks without redirection or loosing balance --anticipate near baseline       General Comments General comments (skin integrity, edema, etc.): Complete LOB when turning to sit in chair requiring external support to prevent fall.    Exercises     Assessment/Plan    PT Assessment Patient needs continued PT services  PT Problem List Decreased strength;Decreased mobility;Decreased safety awareness;Decreased cognition;Impaired sensation;Decreased balance       PT Treatment Interventions Therapeutic activities;DME instruction;Gait training;Therapeutic exercise;Patient/family education;Balance training;Stair training;Functional mobility training;Neuromuscular re-education;Cognitive remediation    PT Goals (Current goals can be found in the Care Plan section)  Acute Rehab PT Goals Patient Stated Goal: to get back home  PT Goal Formulation: With patient Time For Goal Achievement: 12/11/19 Potential to Achieve Goals: Good    Frequency Min 4X/week   Barriers to discharge Decreased caregiver support home alone during the day    Co-evaluation               AM-PAC PT "6 Clicks" Mobility  Outcome Measure Help needed turning from your back to your side while in a flat bed without using bedrails?: None Help needed moving from lying on your back to sitting on the side of a flat bed without using bedrails?: None Help needed moving to and from a bed to a chair (including a wheelchair)?: A Little Help needed standing up from a chair using your arms (e.g., wheelchair or bedside chair)?: A Little Help needed to walk in hospital room?: A Little Help needed climbing 3-5 steps with a railing? : A Little 6 Click Score: 20    End of Session Equipment Utilized During Treatment: Gait belt Activity Tolerance:  Patient tolerated treatment well Patient left:  in chair;with call bell/phone within reach;with chair alarm set Nurse Communication: Mobility status PT Visit Diagnosis: Muscle weakness (generalized) (M62.81);History of falling (Z91.81)    Time: 8185-6314 PT Time Calculation (min) (ACUTE ONLY): 13 min   Charges:   PT Evaluation $PT Eval Moderate Complexity: 1 Mod          Marisa Severin, PT, DPT Acute Rehabilitation Services Pager [phone] Office [phone]      Marguarite Arbour A Sabra Heck 11/27/2019, 12:43 PM

## 2019-11-27 NOTE — Plan of Care (Signed)
  Problem: Education: Goal: Knowledge of disease or condition will improve Outcome: Progressing Goal: Knowledge of secondary prevention will improve Outcome: Progressing   Problem: Coping: Goal: Will verbalize positive feelings about self Outcome: Progressing   Problem: Self-Care: Goal: Ability to participate in self-care as condition permits will improve Outcome: Progressing   Problem: Education: Goal: Knowledge of General Education information will improve Description: Including pain rating scale, medication(s)/side effects and non-pharmacologic comfort measures Outcome: Progressing

## 2019-11-27 NOTE — Progress Notes (Signed)
PROGRESS NOTE    Kelly Williams  AOZ:[id number] DOB: [birth year] DOA: 11/25/2019 PCP: Luetta Nutting, DO   Brief Narrative:  Patient is a 79 year old male with history of hypertension, hyperlipidemia, diabetes mellitus type 2, COPD, CKD, chronic alcohol abuse, coronary disease status post CABG, post CABG CVA who presents with dysarthria.  .  Patient was brought to the emergency department for the concern of persistent dysarthria.  Patient is also noncompliant with his medications.  On presentation, he was hypertensive.  MRI of the brain showed acute right frontal/right infarcts in addition to old infarcts.  Neurology, vascular surgery and IR consulted and following.  Plan for cerebral angiogram.  Assessment & Plan:   Principal Problem:   CVA (cerebral vascular accident) ([facility]) Active Problems:   Coronary artery disease   Carotid arterial disease ([facility])   S/P CABG x 5   Chronic obstructive pulmonary disease ([place])   Type 2 diabetes mellitus with diabetic neuropathy, unspecified ([place])   Ambulates with cane   Essential hypertension   Acute nonhemorrhagic CVA: CT head revealed old infarcts but MRI showed acute infarct involving the right frontal/frontal lobes.  Neurology consulted.  Currently on aspirin, Plavix and statin.  Speech/PT/OT evaluation done.  Hemoglobin A1c of 6.3.  LDL of 76 Plan for cerebral angiogram as per IR after improvement in the kidney function PT/OT recommending home health.  Bilateral carotid artery stenosis:Vascular ultrasound from March 2021 showed right ICA are consistent with a 40-59% stenosis. Heavy plaque burden noted in the mid ICA, s/p  history of CEA. Non-hemodynamically significant plaque <50% noted in the  CCA. Left Carotid: Velocities in the left ICA are consistent with a 60-79% stenosis .  He is a status post right carotid endarterectomy. MRA neck showed focal stenosis on the right, possible pseudoaneurysm versus dissection.  Vascular surgery also following,  carotid duplex ultrasound done on this admission showed 40 to 59% stenosis in right ICA and 60 to 79% stenosis in the left ICA.  Hypertension: Monitor blood pressure.  Continue current medications.  Allow permissive hypertension in the setting of acute stroke, use as needed medications for severe hypertension  AKI on CKD status 3A: Baseline creatinine around 1.3.  Continue gentle IV fluids.  Kidney function has been improving .  Coronary artery disease: Status post CABG.  Has history of chronic systolic cardiomyopathy.  Echo done in February 2021 showed EF of 40 to 45%.  New echocardiogram done here showed ejection fraction 50 to 96%, grade 1 diastolic dysfunction, no intracardiac source of emboli..  Chronic alcohol abuse: Counseled for cessation.  Monitor for withdrawal  Peripheral neuropathy: On Neurontin.  Insulin-dependent diabetes type 2: A1c of 6.3.  On weekly Trulicity at home.  Continue sliding scale insulin.  Leukocytosis: Mild, most likely reactive.  COPD: Stable.  Continue home regimen  GERD: Continue PPI         DVT prophylaxis:Lovenox Code Status: Full Family Communication: daughter on phone on 11/27/19 Status is: inpatient  Dispo: The patient is from: Home              Anticipated d/c is to: Home              Anticipated d/c date is: 2 days              Patient currently is not medically stable to d/c.     Consultants: Vascular surgery, neurology, IR  Procedures: None  Antimicrobials:  Anti-infectives (From admission, onward)   None  Subjective: Patient seen and examined at the bedside this morning.  Hemodynamically stable.  Comfortable.  His speech is clear this morning.  No focal neurological deficits.  Objective: Vitals:   11/26/19 1800 11/26/19 1954 11/26/19 2336 11/27/19 0338  BP: (!) 169/92 (!) 169/82 (!) 162/96 (!) 170/91  Pulse: 81 80 78 79  Resp: 17 18 18 18   Temp: 97.7 F (36.5 C) (!) 97.5 F (36.4 C) 97.9 F (36.6 C) 98 F (36.7  C)  TempSrc: Oral Oral Oral Oral  SpO2: 100% 99% 98% 100%    Intake/Output Summary (Last 24 hours) at 11/27/2019 0818 Last data filed at 11/27/2019 0327 Gross per 24 hour  Intake 1054 ml  Output 100 ml  Net 954 ml   There were no vitals filed for this visit.  Examination:  General exam: Appears calm and comfortable ,Not in distress,average built HEENT:PERRL,Oral mucosa moist, Ear/Nose normal on gross exam Respiratory system: Bilateral equal air entry, normal vesicular breath sounds, no wheezes or crackles  Cardiovascular system: S1 & S2 heard, RRR. No JVD, murmurs, rubs, gallops or clicks. No pedal edema. Gastrointestinal system: Abdomen is nondistended, soft and nontender. No organomegaly or masses felt. Normal bowel sounds heard. Central nervous system: Alert and oriented. No focal neurological deficits. Extremities: No edema, no clubbing ,no cyanosis, Skin: No rashes, lesions or ulcers,no icterus ,no pallor      Data Reviewed: I have personally reviewed following labs and imaging studies  CBC: Recent Labs  Lab 11/26/19 0004 11/26/19 0034 11/27/19 0313  WBC 13.9*  --  11.6*  NEUTROABS 9.3*  --   --   HGB 12.5* 13.3 11.1*  HCT 38.5* 39.0 34.1*  MCV 93.0  --  92.9  PLT 315  --  831   Basic Metabolic Panel: Recent Labs  Lab 11/26/19 0004 11/26/19 0034 11/27/19 0313  NA 138 142 141  K 4.3 4.4 4.2  CL 107 108 113*  CO2 21*  --  22  GLUCOSE 117* 113* 130*  BUN 39* 39* 34*  CREATININE 1.79* 1.80* 1.50*  CALCIUM 9.3  --  8.5*   GFR: CrCl cannot be calculated (Unknown ideal weight.). Liver Function Tests: Recent Labs  Lab 11/26/19 0004  AST 20  ALT 17  ALKPHOS 91  BILITOT 1.0  PROT 7.5  ALBUMIN 3.8   No results for input(s): LIPASE, AMYLASE in the last 168 hours. No results for input(s): AMMONIA in the last 168 hours. Coagulation Profile: Recent Labs  Lab 11/26/19 0004  INR 1.0   Cardiac Enzymes: No results for input(s): CKTOTAL, CKMB,  CKMBINDEX, TROPONINI in the last 168 hours. BNP (last 3 results) No results for input(s): PROBNP in the last 8760 hours. HbA1C: Recent Labs    11/27/19 0313  HGBA1C 6.3*   CBG: Recent Labs  Lab 11/26/19 0001 11/26/19 1612 11/26/19 2111 11/27/19 0612  GLUCAP 110* 130* 144* 102*   Lipid Profile: Recent Labs    11/27/19 0313  CHOL 131  HDL 40*  LDLCALC 76  TRIG 75  CHOLHDL 3.3   Thyroid Function Tests: No results for input(s): TSH, T4TOTAL, FREET4, T3FREE, THYROIDAB in the last 72 hours. Anemia Panel: No results for input(s): VITAMINB12, FOLATE, FERRITIN, TIBC, IRON, RETICCTPCT in the last 72 hours. Sepsis Labs: No results for input(s): PROCALCITON, LATICACIDVEN in the last 168 hours.  Recent Results (from the past 240 hour(s))  SARS Coronavirus 2 by RT PCR (hospital order, performed in [facility] hospital lab) Nasopharyngeal Nasopharyngeal Swab  Status: None   Collection Time: 11/26/19 12:32 PM   Specimen: Nasopharyngeal Swab  Result Value Ref Range Status   SARS Coronavirus 2 NEGATIVE NEGATIVE Final    Comment: (NOTE) SARS-CoV-2 target nucleic acids are NOT DETECTED.  The SARS-CoV-2 RNA is generally detectable in upper and lower respiratory specimens during the acute phase of infection. The lowest concentration of SARS-CoV-2 viral copies this assay can detect is 250 copies / mL. A negative result does not preclude SARS-CoV-2 infection and should not be used as the sole basis for treatment or other patient management decisions.  A negative result may occur with improper specimen collection / handling, submission of specimen other than nasopharyngeal swab, presence of viral mutation(s) within the areas targeted by this assay, and inadequate number of viral copies (<250 copies / mL). A negative result must be combined with clinical observations, patient history, and epidemiological information.  Fact Sheet for Patients:     StrictlyIdeas.no  Fact Sheet for Healthcare Providers: BankingDealers.co.za  This test is not yet approved or  cleared by the [place] FDA and has been authorized for detection and/or diagnosis of SARS-CoV-2 by FDA under an Emergency Use Authorization (EUA).  This EUA will remain in effect (meaning this test can be used) for the duration of the COVID-19 declaration under Section 564(b)(1) of the Act, 21 U.S.C. section 360bbb-3(b)(1), unless the authorization is terminated or revoked sooner.  Performed at [facility], [place] [address] [place], [place] [address]          Radiology Studies: CT HEAD WO CONTRAST  Result Date: 11/26/2019 CLINICAL DATA:  Left-sided facial droop with slurred speech EXAM: CT HEAD WITHOUT CONTRAST TECHNIQUE: Contiguous axial images were obtained from the base of the skull through the vertex without intravenous contrast. COMPARISON:  None. FINDINGS: Brain: There is no mass, hemorrhage or extra-axial collection. The size and configuration of the ventricles and extra-axial CSF spaces are normal. There is hypoattenuation of the white matter, most commonly indicating chronic small vessel disease. Old right MCA territory infarct is unchanged. There are old small vessel infarcts of the left basal ganglia and right thalamus. Vascular: No abnormal hyperdensity of the major intracranial arteries or dural venous sinuses. No intracranial atherosclerosis. Skull: The visualized skull base, calvarium and extracranial soft tissues are normal. Sinuses/Orbits: No fluid levels or advanced mucosal thickening of the visualized paranasal sinuses. No mastoid or middle ear effusion. The orbits are normal. IMPRESSION: 1. No acute intracranial abnormality. 2. Old right MCA territory infarct and chronic small vessel disease. Electronically Signed   By: Ulyses Jarred M.D.   On: 11/26/2019 00:56   MR ANGIO HEAD WO CONTRAST  Result  Date: 11/26/2019 CLINICAL DATA:  Right MCA stroke. History of right carotid endarterectomy. Right carotid aneurysm. EXAM: MRA NECK WITHOUT AND WITH CONTRAST MRA HEAD WITHOUT CONTRAST TECHNIQUE: Multiplanar and multiecho pulse sequences of the neck were obtained without and with intravenous contrast. Angiographic images of the neck were obtained using MRA technique without and with intravenous contast.; Angiographic images of the Circle of Willis were obtained using MRA technique without intravenous contrast. CONTRAST:  67mL GADAVIST GADOBUTROL 1 MMOL/ML IV SOLN COMPARISON:  MRI head 11/26/2019 FINDINGS: MRA NECK FINDINGS Aortic arch normal. Proximal great vessels widely patent. There is a moderate to severe stenosis of the origin of the right subclavian artery with diffuse disease distal to this. Left subclavian artery widely patent. Right carotid bifurcation widely patent. By history prior endarterectomy. Approximately 3 cm distal to the bifurcation,  there is an area of moderate to severe focal stenosis in the internal carotid artery with adjacent 11 mm aneurysm. This may be a pseudoaneurysm from prior endarterectomy. In addition, there is mild to moderate stenosis of the cervical internal carotid artery below the skull base presumably atherosclerotic. Left carotid bifurcation widely patent. Approximately 50% diameter stenosis proximal left internal carotid artery above the bifurcation. Mild atherosclerotic disease proximal right vertebral artery without significant stenosis. Mild stenosis distal right vertebral artery. Right vertebral artery patent to the basilar Small left vertebral artery which appears to end in PICA. MRA HEAD FINDINGS Right vertebral artery is patent to the basilar. Right PICA patent. Left vertebral artery is small and ends in PICA. Mild atherosclerotic irregularity of the distal basilar without significant stenosis. Diffuse disease in the right posterior cerebral artery with moderate to severe  stenosis proximally and in the P2 segment. Fetal origin of the left posterior cerebral artery which appears widely patent. Atherosclerotic disease in the cavernous carotid bilaterally. Moderate stenosis on the right and mild to moderate stenosis on the left. Moderate stenosis proximal right A1 segment. Right M1 segment widely patent. Mild stenosis in the superior division of the right middle cerebral artery at the origin. Left anterior cerebral artery patent with mild to moderate atherosclerotic disease. Moderate stenosis in the superior division of the left middle cerebral artery. Negative for cerebral aneurysm. IMPRESSION: 1. By history, right carotid endarterectomy. Right carotid bifurcation patent. 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 Mild to moderate stenosis of the distal cervical internal carotid artery 2. 50% diameter stenosis proximal left internal carotid artery above the bifurcation 3. Right vertebral artery supplies the basilar. Small left vertebral artery ends in PICA 4. Moderate intracranial atherosclerotic disease without large vessel occlusion. 5. Moderate to severe stenosis proximal right subclavian artery. Electronically Signed   By: Franchot Gallo M.D.   On: 11/26/2019 15:26   MR ANGIO NECK W WO CONTRAST  Result Date: 11/26/2019 CLINICAL DATA:  Right MCA stroke. History of right carotid endarterectomy. Right carotid aneurysm. EXAM: MRA NECK WITHOUT AND WITH CONTRAST MRA HEAD WITHOUT CONTRAST TECHNIQUE: Multiplanar and multiecho pulse sequences of the neck were obtained without and with intravenous contrast. Angiographic images of the neck were obtained using MRA technique without and with intravenous contast.; Angiographic images of the Circle of Willis were obtained using MRA technique without intravenous contrast. CONTRAST:  28mL GADAVIST  GADOBUTROL 1 MMOL/ML IV SOLN COMPARISON:  MRI head 11/26/2019 FINDINGS: MRA NECK FINDINGS Aortic arch normal. Proximal great vessels widely patent. There is a moderate to severe stenosis of the origin of the right subclavian artery with diffuse disease distal to this. Left subclavian artery widely patent. Right carotid bifurcation widely patent. By history prior endarterectomy. Approximately 3 cm distal to the bifurcation, there is an area of moderate to severe focal stenosis in the internal carotid artery with adjacent 11 mm aneurysm. This may be a pseudoaneurysm from prior endarterectomy. In addition, there is mild to moderate stenosis of the cervical internal carotid artery below the skull base presumably atherosclerotic. Left carotid bifurcation widely patent. Approximately 50% diameter stenosis proximal left internal carotid artery above the bifurcation. Mild atherosclerotic disease proximal right vertebral artery without significant stenosis. Mild stenosis distal right vertebral artery. Right vertebral artery patent to the basilar Small left vertebral artery which appears to end in PICA. MRA HEAD FINDINGS Right vertebral artery  is patent to the basilar. Right PICA patent. Left vertebral artery is small and ends in PICA. Mild atherosclerotic irregularity of the distal basilar without significant stenosis. Diffuse disease in the right posterior cerebral artery with moderate to severe stenosis proximally and in the P2 segment. Fetal origin of the left posterior cerebral artery which appears widely patent. Atherosclerotic disease in the cavernous carotid bilaterally. Moderate stenosis on the right and mild to moderate stenosis on the left. Moderate stenosis proximal right A1 segment. Right M1 segment widely patent. Mild stenosis in the superior division of the right middle cerebral artery at the origin. Left anterior cerebral artery patent with mild to moderate atherosclerotic disease. Moderate stenosis in the  superior division of the left middle cerebral artery. Negative for cerebral aneurysm. IMPRESSION: 1. By history, right carotid endarterectomy. Right carotid bifurcation patent. 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 Mild to moderate stenosis of the distal cervical internal carotid artery 2. 50% diameter stenosis proximal left internal carotid artery above the bifurcation 3. Right vertebral artery supplies the basilar. Small left vertebral artery ends in PICA 4. Moderate intracranial atherosclerotic disease without large vessel occlusion. 5. Moderate to severe stenosis proximal right subclavian artery. Electronically Signed   By: Franchot Gallo M.D.   On: 11/26/2019 15:26   MR BRAIN WO CONTRAST  Result Date: 11/26/2019 CLINICAL DATA:  Neuro deficit. EXAM: MRI HEAD WITHOUT CONTRAST TECHNIQUE: Multiplanar, multiecho pulse sequences of the brain and surrounding structures were obtained without intravenous contrast. COMPARISON:  11/26/2019 head CT.  01/16/2019 MRI head. FINDINGS: Please note image quality is mildly degraded by motion artifact. Brain: Focal restricted diffusion involving the right frontal and parietal regions with correlative T2/FLAIR hyperintense signal. Sequela of remote right MCA territory, bilateral occipital and right temporal insults. Remote lacunar infarcts involving the right corona radiata, bilateral basal ganglia, midbrain and right thalamus. Tiny remote left cerebellar infarct. Moderate diffuse parenchymal volume loss with ex vacuo dilatation. Remote tiny right thalamic microhemorrhage. No midline shift, mass lesion or extra-axial fluid collection. Scattered and confluent background white matter T2/FLAIR hyperintense foci are nonspecific however commonly associated with chronic microvascular ischemic changes. Vascular: Grossly normal flow voids.  Tortuous basilar artery and right V4 segment abutting the right anterolateral medulla. Skull and upper cervical spine: Normal marrow signal. Upper cervical spondylosis. Sinuses/Orbits: Sequela of bilateral lens replacement. Mild ethmoid sinus mucosal thickening. No mastoid effusion. Other: None. IMPRESSION: Acute infarcts involving the right frontal and parietal lobes. Remote infarcts involving the right MCA territory, bilateral occipital, right temporal region and left cerebellum. Remote lacunar infarcts involving the right corona radiata, bilateral basal ganglia, midbrain and right thalamus. Moderate cerebral atrophy. Advanced chronic microvascular ischemic changes. These results were called by telephone at the time of interpretation on 11/26/2019 at 10:21 am to provider [facility] , who verbally acknowledged these results. Electronically Signed   By: Primitivo Gauze M.D.   On: 11/26/2019 10:25        Scheduled Meds: . aspirin EC  325 mg Oral Daily  . atorvastatin  80 mg Oral QHS  . clopidogrel  75 mg Oral Daily  . enoxaparin (LOVENOX) injection  30 mg Subcutaneous Q24H  . gabapentin  300 mg Oral TID  . insulin aspart  0-15 Units Subcutaneous TID WC  . insulin glargine  10 Units Subcutaneous QHS  . pantoprazole  40 mg Oral Daily   Continuous  Infusions: . sodium chloride 100 mL/hr at 11/27/19 0415     LOS: 0 days    Time spent: 35 mins.More than 50% of that time was spent in counseling and/or coordination of care.      Shelly Coss, MD [facility] P7/27/2021, 8:18 AM

## 2019-11-27 NOTE — Progress Notes (Signed)
  Echocardiogram 2D Echocardiogram has been performed.  Darlina Sicilian M 11/27/2019, 11:50 AM

## 2019-11-27 NOTE — Progress Notes (Signed)
STROKE TEAM PROGRESS NOTE   INTERVAL HISTORY Daughter is at bedside. She stated that pt had 5-vessel CABG 15 years ago and shortly after the procedure he had stroke but only has residue of left leg slight weakness. No more strokes since. However, he did have right CEA 10 years ago and left CEA 7 years ago. This time, he was drinking and fell, but the reason daughter sent him to ED was because of his left facial droop and slurry speech. She denies any arm or leg weakness out of baseline. Pt stated he feels better now. But he still has dysarthria and mild left facial droop. He has old right shoulder rotator cuff injury.   Vitals:   11/26/19 2336 11/27/19 0338 11/27/19 0844 11/27/19 0959  BP: (!) 162/96 (!) 170/91 (!) 179/91 (!) 163/79  Pulse: 78 79 75 79  Resp: 18 18 15 16   Temp: 97.9 F (36.6 C) 98 F (36.7 C)  97.6 F (36.4 C)  TempSrc: Oral Oral  Oral  SpO2: 98% 100% 100% 98%   CBC:  Recent Labs  Lab 11/26/19 0004 11/26/19 0004 11/26/19 0034 11/27/19 0313  WBC 13.9*  --   --  11.6*  NEUTROABS 9.3*  --   --   --   HGB 12.5*   < > 13.3 11.1*  HCT 38.5*   < > 39.0 34.1*  MCV 93.0  --   --  92.9  PLT 315  --   --  276   < > = values in this interval not displayed.   Basic Metabolic Panel:  Recent Labs  Lab 11/26/19 0004 11/26/19 0004 11/26/19 0034 11/27/19 0313  NA 138   < > 142 141  K 4.3   < > 4.4 4.2  CL 107   < > 108 113*  CO2 21*  --   --  22  GLUCOSE 117*   < > 113* 130*  BUN 39*   < > 39* 34*  CREATININE 1.79*   < > 1.80* 1.50*  CALCIUM 9.3  --   --  8.5*   < > = values in this interval not displayed.   Lipid Panel:  Recent Labs  Lab 11/27/19 0313  CHOL 131  TRIG 75  HDL 40*  CHOLHDL 3.3  VLDL 15  LDLCALC 76   HgbA1c:  Recent Labs  Lab 11/27/19 0313  HGBA1C 6.3*   Urine Drug Screen:  Recent Labs  Lab 11/26/19 0921  LABOPIA NONE DETECTED  COCAINSCRNUR NONE DETECTED  LABBENZ NONE DETECTED  AMPHETMU NONE DETECTED  THCU NONE DETECTED  LABBARB  NONE DETECTED    Alcohol Level  Recent Labs  Lab 11/26/19 0852  ETH <10    IMAGING past 24 hours MR ANGIO HEAD WO CONTRAST  Result Date: 11/26/2019 CLINICAL DATA:  Right MCA stroke. History of right carotid endarterectomy. Right carotid aneurysm. EXAM: MRA NECK WITHOUT AND WITH CONTRAST MRA HEAD WITHOUT CONTRAST TECHNIQUE: Multiplanar and multiecho pulse sequences of the neck were obtained without and with intravenous contrast. Angiographic images of the neck were obtained using MRA technique without and with intravenous contast.; Angiographic images of the Circle of Willis were obtained using MRA technique without intravenous contrast. CONTRAST:  23mL GADAVIST GADOBUTROL 1 MMOL/ML IV SOLN COMPARISON:  MRI head 11/26/2019 FINDINGS: MRA NECK FINDINGS Aortic arch normal. Proximal great vessels widely patent. There is a moderate to severe stenosis of the origin of the right subclavian artery with diffuse disease distal to this. Left subclavian artery  widely patent. Right carotid bifurcation widely patent. By history prior endarterectomy. Approximately 3 cm distal to the bifurcation, there is an area of moderate to severe focal stenosis in the internal carotid artery with adjacent 11 mm aneurysm. This may be a pseudoaneurysm from prior endarterectomy. In addition, there is mild to moderate stenosis of the cervical internal carotid artery below the skull base presumably atherosclerotic. Left carotid bifurcation widely patent. Approximately 50% diameter stenosis proximal left internal carotid artery above the bifurcation. Mild atherosclerotic disease proximal right vertebral artery without significant stenosis. Mild stenosis distal right vertebral artery. Right vertebral artery patent to the basilar Small left vertebral artery which appears to end in PICA. MRA HEAD FINDINGS Right vertebral artery is patent to the basilar. Right PICA patent. Left vertebral artery is small and ends in PICA. Mild atherosclerotic  irregularity of the distal basilar without significant stenosis. Diffuse disease in the right posterior cerebral artery with moderate to severe stenosis proximally and in the P2 segment. Fetal origin of the left posterior cerebral artery which appears widely patent. Atherosclerotic disease in the cavernous carotid bilaterally. Moderate stenosis on the right and mild to moderate stenosis on the left. Moderate stenosis proximal right A1 segment. Right M1 segment widely patent. Mild stenosis in the superior division of the right middle cerebral artery at the origin. Left anterior cerebral artery patent with mild to moderate atherosclerotic disease. Moderate stenosis in the superior division of the left middle cerebral artery. Negative for cerebral aneurysm. IMPRESSION: 1. By history, right carotid endarterectomy. Right carotid bifurcation patent. 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 Mild to moderate stenosis of the distal cervical internal carotid artery 2. 50% diameter stenosis proximal left internal carotid artery above the bifurcation 3. Right vertebral artery supplies the basilar. Small left vertebral artery ends in PICA 4. Moderate intracranial atherosclerotic disease without large vessel occlusion. 5. Moderate to severe stenosis proximal right subclavian artery. Electronically Signed   By: Franchot Gallo M.D.   On: 11/26/2019 15:26   MR ANGIO NECK W WO CONTRAST  Result Date: 11/26/2019 CLINICAL DATA:  Right MCA stroke. History of right carotid endarterectomy. Right carotid aneurysm. EXAM: MRA NECK WITHOUT AND WITH CONTRAST MRA HEAD WITHOUT CONTRAST TECHNIQUE: Multiplanar and multiecho pulse sequences of the neck were obtained without and with intravenous contrast. Angiographic images of the neck were obtained using MRA technique without and with intravenous  contast.; Angiographic images of the Circle of Willis were obtained using MRA technique without intravenous contrast. CONTRAST:  15mL GADAVIST GADOBUTROL 1 MMOL/ML IV SOLN COMPARISON:  MRI head 11/26/2019 FINDINGS: MRA NECK FINDINGS Aortic arch normal. Proximal great vessels widely patent. There is a moderate to severe stenosis of the origin of the right subclavian artery with diffuse disease distal to this. Left subclavian artery widely patent. Right carotid bifurcation widely patent. By history prior endarterectomy. Approximately 3 cm distal to the bifurcation, there is an area of moderate to severe focal stenosis in the internal carotid artery with adjacent 11 mm aneurysm. This may be a pseudoaneurysm from prior endarterectomy. In addition, there is mild to moderate stenosis of the cervical internal carotid artery below the skull base presumably atherosclerotic. Left carotid bifurcation widely patent. Approximately 50% diameter stenosis proximal left internal carotid artery above the bifurcation. Mild atherosclerotic disease proximal right vertebral artery without significant stenosis. Mild stenosis distal right vertebral artery. Right vertebral artery patent to  the basilar Small left vertebral artery which appears to end in PICA. MRA HEAD FINDINGS Right vertebral artery is patent to the basilar. Right PICA patent. Left vertebral artery is small and ends in PICA. Mild atherosclerotic irregularity of the distal basilar without significant stenosis. Diffuse disease in the right posterior cerebral artery with moderate to severe stenosis proximally and in the P2 segment. Fetal origin of the left posterior cerebral artery which appears widely patent. Atherosclerotic disease in the cavernous carotid bilaterally. Moderate stenosis on the right and mild to moderate stenosis on the left. Moderate stenosis proximal right A1 segment. Right M1 segment widely patent. Mild stenosis in the superior division of the right middle  cerebral artery at the origin. Left anterior cerebral artery patent with mild to moderate atherosclerotic disease. Moderate stenosis in the superior division of the left middle cerebral artery. Negative for cerebral aneurysm. IMPRESSION: 1. By history, right carotid endarterectomy. Right carotid bifurcation patent. 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 Mild to moderate stenosis of the distal cervical internal carotid artery 2. 50% diameter stenosis proximal left internal carotid artery above the bifurcation 3. Right vertebral artery supplies the basilar. Small left vertebral artery ends in PICA 4. Moderate intracranial atherosclerotic disease without large vessel occlusion. 5. Moderate to severe stenosis proximal right subclavian artery. Electronically Signed   By: Franchot Gallo M.D.   On: 11/26/2019 15:26   IR PATIENT EVAL TECH 0-60 MINS  Result Date: 11/27/2019 Rosann Auerbach, RT     11/27/2019  9:19 AM Case could not be attempted due kidney function and conflicting referrals with vascular surgery.  ECHOCARDIOGRAM COMPLETE  Result Date: 11/27/2019    ECHOCARDIOGRAM REPORT   Patient Name:   GILLIAM HAWKES Date of Exam: 11/27/2019 Medical Rec #:  [id number]      Height:       66.0 in Accession #:    [id number]     Weight:       123.0 lb Date of Birth:  May 16, 1940      BSA:          1.627 m Patient Age:    79 years       BP:           173/86 mmHg Patient Gender: M              HR:           79 bpm. Exam Location:  Inpatient Procedure: 2D Echo Indications:    Stroke 434.91 / I163.9  History:        Patient has prior history of Echocardiogram examinations, most                 recent 06/27/2019. CAD; Risk Factors:Hypertension, Diabetes and                 Dyslipidemia. Carotid artery disease. Chronic kidney disease.  Sonographer:    Darlina Sicilian RDCS Referring  Phys: [id number] [place]  1. Left ventricular ejection fraction, by estimation, is 50 to 55%. The left ventricle has low normal function. The left ventricle has no regional wall motion abnormalities. Left ventricular diastolic parameters are consistent with Grade I diastolic dysfunction (impaired relaxation).  2. Right ventricular systolic function is normal. The right ventricular size is normal.  3. The mitral valve is normal in structure. Mild mitral valve regurgitation. No evidence of  mitral stenosis.  4. The aortic valve is tricuspid. Aortic valve regurgitation is not visualized. Mild to moderate aortic valve sclerosis/calcification is present, without any evidence of aortic stenosis.  5. The inferior vena cava is normal in size with greater than 50% respiratory variability, suggesting right atrial pressure of 3 mmHg. Conclusion(s)/Recommendation(s): No intracardiac source of embolism detected on this transthoracic study. A transesophageal echocardiogram is recommended to exclude cardiac source of embolism if clinically indicated. FINDINGS  Left Ventricle: Left ventricular ejection fraction, by estimation, is 50 to 55%. The left ventricle has low normal function. The left ventricle has no regional wall motion abnormalities. The left ventricular internal cavity size was normal in size. There is no left ventricular hypertrophy. Left ventricular diastolic parameters are consistent with Grade I diastolic dysfunction (impaired relaxation). Right Ventricle: The right ventricular size is normal. No increase in right ventricular wall thickness. Right ventricular systolic function is normal. Left Atrium: Left atrial size was normal in size. Right Atrium: Right atrial size was normal in size. Pericardium: There is no evidence of pericardial effusion. Mitral Valve: The mitral valve is normal in structure. Normal mobility of the mitral valve leaflets. Mild mitral valve regurgitation. No evidence of mitral valve  stenosis. Tricuspid Valve: The tricuspid valve is normal in structure. Tricuspid valve regurgitation is not demonstrated. No evidence of tricuspid stenosis. Aortic Valve: The aortic valve is tricuspid. Aortic valve regurgitation is not visualized. Mild to moderate aortic valve sclerosis/calcification is present, without any evidence of aortic stenosis. Pulmonic Valve: The pulmonic valve was normal in structure. Pulmonic valve regurgitation is not visualized. No evidence of pulmonic stenosis. Aorta: The aortic root is normal in size and structure. Venous: The inferior vena cava is normal in size with greater than 50% respiratory variability, suggesting right atrial pressure of 3 mmHg. IAS/Shunts: No atrial level shunt detected by color flow Doppler.  LEFT VENTRICLE PLAX 2D LVIDd:         4.00 cm  Diastology LV IVS:        1.60 cm  LV e' lateral:   6.83 cm/s LVOT diam:     2.10 cm  LV E/e' lateral: 10.1 LV SV:         56       LV e' medial:    4.12 cm/s LV SV Index:   35       LV E/e' medial:  16.7 LVOT Area:     3.46 cm  RIGHT VENTRICLE TAPSE (M-mode): 1.0 cm LEFT ATRIUM             Index       RIGHT ATRIUM           Index LA Vol (A2C):   43.0 ml 26.44 ml/m RA Area:     11.60 cm LA Vol (A4C):   49.5 ml 30.43 ml/m RA Volume:   22.20 ml  13.65 ml/m LA Biplane Vol: 47.6 ml 29.27 ml/m  AORTIC VALVE LVOT Vmax:   77.70 cm/s LVOT Vmean:  56.900 cm/s LVOT VTI:    0.163 m  AORTA Ao Root diam: 3.70 cm MITRAL VALVE MV Area (PHT): 4.06 cm     SHUNTS MV Decel Time: 187 msec     Systemic VTI:  0.16 m MV E velocity: 69.00 cm/s   Systemic Diam: 2.10 cm MV A velocity: 112.00 cm/s MV E/A ratio:  0.62 Candee Furbish MD Electronically signed by Candee Furbish MD Signature Date/Time: 11/27/2019/12:39:33 PM    Final    VAS US CAROTID  Result Date: 11/27/2019 Carotid Arterial Duplex Study Indications:       CVA. Risk Factors:      Hypertension, hyperlipidemia, Diabetes, prior CVA. Comparison Study:  Prior study 07-09-2019 RT ICA  40-59%, LT ICA 60-79% Performing Technologist: Darlin Coco  Examination Guidelines: A complete evaluation includes B-mode imaging, spectral Doppler, color Doppler, and power Doppler as needed of all accessible portions of each vessel. Bilateral testing is considered an integral part of a complete examination. Limited examinations for reoccurring indications may be performed as noted.  Right Carotid Findings: +----------+--------+--------+--------+-------------------+--------------------+           PSV cm/sEDV cm/sStenosisPlaque Description Comments             +----------+--------+--------+--------+-------------------+--------------------+ CCA Prox  82      8               heterogenous                            +----------+--------+--------+--------+-------------------+--------------------+ CCA Distal88      8               heterogenous                            +----------+--------+--------+--------+-------------------+--------------------+ ICA Prox  223     26      40-59%  heterogenous and   Velocities may                                         hypoechoic         underestimate degree                                                      of stenosis due to                                                        more proximal                                                             obstruction.         +----------+--------+--------+--------+-------------------+--------------------+ ICA Mid   146     18                                                      +----------+--------+--------+--------+-------------------+--------------------+ ICA Distal66      11                                                      +----------+--------+--------+--------+-------------------+--------------------+  ECA       127                                                              +----------+--------+--------+--------+-------------------+--------------------+ +----------+--------+-------+----------------+-------------------+           PSV cm/sEDV cmsDescribe        Arm Pressure (mmHG) +----------+--------+-------+----------------+-------------------+ [id number]            Multiphasic, WNL                    +----------+--------+-------+----------------+-------------------+ +---------+--------+---+--------+--+---------+ VertebralPSV cm/s136EDV cm/s13Antegrade +---------+--------+---+--------+--+---------+  Left Carotid Findings: +----------+--------+--------+--------+-------------------+--------------------+           PSV cm/sEDV cm/sStenosisPlaque Description Comments             +----------+--------+--------+--------+-------------------+--------------------+ CCA Prox  100     16              heterogenous                            +----------+--------+--------+--------+-------------------+--------------------+ CCA Distal96      12                                                      +----------+--------+--------+--------+-------------------+--------------------+ ICA Prox  257     60      60-79%  calcific and       Velocities may                                         diffuse            underestimate degree                                                      of stenosis due to                                                        more proximal                                                             obstruction.         +----------+--------+--------+--------+-------------------+--------------------+ ICA Mid   234     39                                                      +----------+--------+--------+--------+-------------------+--------------------+  ICA Distal77      22                                                       +----------+--------+--------+--------+-------------------+--------------------+ ECA       111                                                             +----------+--------+--------+--------+-------------------+--------------------+ +----------+--------+--------+----------------+-------------------+           PSV cm/sEDV cm/sDescribe        Arm Pressure (mmHG) +----------+--------+--------+----------------+-------------------+ Subclavian122     97      Multiphasic, WNL                    +----------+--------+--------+----------------+-------------------+ +---------+--------+--+--------+-+---------+ VertebralPSV cm/s52EDV cm/s8Antegrade +---------+--------+--+--------+-+---------+   Summary: Right Carotid: Velocities in the right ICA are consistent with a 40-59%                stenosis. Non-hemodynamically significant plaque <50% noted in                the CCA. Left Carotid: Velocities in the left ICA are consistent with a 60-79% stenosis.               Non-hemodynamically significant plaque <50% noted in the CCA. Vertebrals:  Bilateral vertebral arteries demonstrate antegrade flow. Subclavians: Normal flow hemodynamics were seen in bilateral subclavian              arteries. *See table(s) above for measurements and observations.     Preliminary     PHYSICAL EXAM  Temp:  [97.5 F (36.4 C)-98.2 F (36.8 C)] 97.9 F (36.6 C) (07/27 1618) Pulse Rate:  [75-89] 85 (07/27 1618) Resp:  [15-20] 18 (07/27 1618) BP: (152-179)/(78-96) 152/78 (07/27 1618) SpO2:  [98 %-100 %] 100 % (07/27 1618)  General - Well nourished, well developed, in no apparent distress.  Ophthalmologic - fundi not visualized due to noncooperation.  Cardiovascular - Regular rhythm and rate.  Mental Status -  Level of arousal and orientation to time, place, and person were intact. Language including expression, naming, repetition, comprehension was assessed and found intact. Mild dysarthria Fund of  Knowledge was assessed and was intact.  Cranial Nerves II - XII - II - Visual field intact OU. III, IV, VI - Extraocular movements intact. V - Facial sensation intact bilaterally. VII - mild left facial droop. VIII - Hearing & vestibular intact bilaterally. X - Palate elevates symmetrically. XI - Chin turning & shoulder shrug intact bilaterally. XII - Tongue protrusion intact.  Motor Strength - The patient's strength was normal in all extremities and pronator drift was absent except chronic right shoulder rotator cuff injury with limited ROM.  Bulk was normal and fasciculations were absent.   Motor Tone - Muscle tone was assessed at the neck and appendages and was normal.  Reflexes - The patient's reflexes were symmetrical in all extremities and he had no pathological reflexes.  Sensory - Light touch, temperature/pinprick were assessed and were symmetrical.    Coordination - The patient had normal movements in the right hand with no ataxia or dysmetria. However,  left FTN mild dysmetria. Tremor was absent.  Gait and Station - deferred.   ASSESSMENT/PLAN [name] is a 79 y.o. male with history of stroke, hypertension, hyperlipidemia, diabetes, carotid disease s/p R CEA, medication noncompliance and alcohol abuse presenting with facial droop and dysarthria following intoxication.   Stroke:   R MCA small infarcts, likely due to R ICA stenosis s/p CEA, workup underway  CT head No acute abnormality. Old R MCA infarct. Small vessel disease. ASPECTS 10.     MRI  R frontal and parietal lobe infarcts. Old R MCA territory, B occipital, R temporal lobe and L cerebellar infarcts. Old R corona radiata, B basal ganglia, midbrain and R thalamic lacunes.   MRA head & neck  R CEA w/ bifurcation patent. Focal stenosis 3cm distal to bifurcation w/ adjacent 74mm aneurysm. 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 Distal cervical R ICA w/ mild to moderate stenosis. L ICA proximal to bifurcation  50%. Moderate intracranial atherosclerosis. R subclavian w/ moderate to severe stenosis.   Carotid Doppler  R 40-59% stenosis, L 60-79% stenosis   Will do CTA head and neck in am if Cre further improves  2D Echo EF 50-55%. No source of embolus   LDL 76  HgbA1c 6.3  VTE prophylaxis - Lovenox 30 mg sq daily   aspirin 81 mg daily prior to admission, now on aspirin 325 mg daily and clopidogrel 75 mg daily.  Therapy recommendations:  OP SLP  Disposition:  pending - daughter works 10 hours a day  Carotid disease, bilateral   Hx R CEA 10 years ago and L CEA 7 years ago  MRA neck  R CEA w/ bifurcation patent. Focal stenosis 3cm distal to bifurcation w/ adjacent 25mm aneurysm. 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 Distal cervical R ICA w/ mild to moderate stenosis. L ICA proximal to bifurcation 50%.   Carotid Doppler  R 40-59% stenosis, L 60-79% stenosis    CTA head and neck in am if Cre improves  VVS onboard (Dr. Oneida Alar)  Hypertension   Stable 150-170s . Permissive hypertension (OK if < 220/120) but gradually lower in 5-7 days . Long-term BP goal 130-150  Hyperlipidemia  Home meds:  lipitor 40  Now on lipitor 80  LDL 76, goal < 70  Continue statin at discharge  Diabetes type II, insulin dependent, Controlled  HgbA1c 6.3, goal < 7.0  CBGs   SSI  Close PCP follow up  Chronic ETOH abuse  Intoxication just prior to admission   alcohol level <10  CIWA protocol  advised to drink no more than 2 drink(s) a day  Other Stroke Risk Factors  Advanced age  Former Cigarette smoker, quit 17 yrs ago  Hx stroke/TIA  Coronary artery disease s/p CABG x 5  Chronic systolic cardiomyopathy   Other Active Problems  COPD  AKI on CKD stage III. Cre 1.79-1.8-1.5->pending  Peripheral neuropathy on Neurontin   Milk leukocytosis, WBC 13.9-11.6, thought to be reactive  GERD on PPI  Hospital day # 0  I had long discussion with daughter at bedside, updated pt  current condition, treatment plan and potential prognosis, and answered all the questions. She expressed understanding and appreciation.   Rosalin Hawking, MD PhD Stroke Neurology 11/27/2019 4:49 PM  To contact Stroke Continuity provider, please refer to [email or web address] After hours, contact General Neurology

## 2019-11-27 NOTE — Consult Note (Signed)
Chief Complaint: Right brain stroke with prior right carotid endartecectomy  Referring Physician(s): Charlsie Quest PA  Supervising Physician: Luanne Bras  Patient Status: [facility] - In-pt  History of Present Illness: Kelly Williams is a 79 y.o. male History of HTN, HLD, DM, CKD, chronic alcohol abuse, CAD s/p CABG, CVA approximately 15 years ago s/p right carotid endartectomy approximately 5 years ago at OSH and bilateral carotid artery stenosis (Vascular US from 3.8.2021). Patient presented to the emergency department  with daughter with symptoms of  dysarthria, slurred speech and left sided facial droop X  3 days.  MR brain from 7.26.21 reads Acute infarcts involving the right frontal and parietal lobes  Team is requesting cerebral angiogram.   Past Medical History:  Diagnosis Date  . Alcohol abuse   . Alcohol abuse   . Arthritis   . Carotid arterial disease ([place])   . Chronic kidney disease    CKD-patient denies  . COPD (chronic obstructive pulmonary disease) (HCC)    uses inhalers  . Coronary artery disease   . Diabetes mellitus without complication ([place])   . Diabetic retinopathy ([place])   . Hyperlipidemia   . Hypertension   . Normocytic anemia 01/16/2019  . Stroke [facility])    around 2020, 2 after bypass in 2015    Past Surgical History:  Procedure Laterality Date  . APPENDECTOMY     as teenager  . BACK SURGERY    . CAROTID ENDARTERECTOMY Right   . CORONARY ARTERY BYPASS GRAFT     10-12 years ago in [place] at [place] Right 07/18/2019   Procedure: REVERSE SHOULDER ARTHROPLASTY WITH SUBSCAPULAR REPAIR;  Surgeon: Hiram Gash, MD;  Location: [facility] ORS;  Service: Orthopedics;  Laterality: Right;  [name] VASECTOMY      Allergies: Metformin and related  Medications: Prior to Admission medications   Medication Sig Start Date End Date Taking? Authorizing Provider  acetaminophen (TYLENOL) 500 MG tablet Take 1,000 mg by mouth every 6  (six) hours as needed for mild pain.   Yes [provider]  albuterol (PROVENTIL HFA;VENTOLIN HFA) 108 (90 Base) MCG/ACT inhaler Inhale 1-2 puffs into the lungs every 4 (four) hours as needed for wheezing or shortness of breath (bronchospasm). 06/20/18  Yes Trixie Dredge, PA-C  aspirin EC 81 MG tablet Take 1 tablet (81 mg total) by mouth daily. Patient taking differently: Take 81 mg by mouth at bedtime.  06/25/19  Yes Buford Dresser, MD  atorvastatin (LIPITOR) 40 MG tablet Take 1 tablet (40 mg total) by mouth at bedtime. 09/07/19  Yes Luetta Nutting, DO  bismuth subsalicylate (PEPTO BISMOL) 262 MG/15ML suspension Take 30 mLs by mouth every 6 (six) hours as needed for indigestion or diarrhea or loose stools.   Yes [provider]  celecoxib (CELEBREX) 100 MG capsule Take 1 capsule (100 mg total) by mouth 2 (two) times daily as needed for mild pain. Patient taking differently: Take 200 mg by mouth 2 (two) times daily as needed for mild pain.  09/10/19  Yes Luetta Nutting, DO  Dulaglutide (TRULICITY) 6.21 HY/8.6VH SOPN Inject 0.75 mg into the skin once a week. Patient taking differently: Inject 0.75 mg into the skin once a week. Saturday 09/10/19  Yes Luetta Nutting, DO  gabapentin (NEURONTIN) 300 MG capsule Take 1 capsule (300 mg total) by mouth 3 (three) times daily. 09/07/19  Yes Luetta Nutting, DO  ipratropium-albuterol (DUONEB) 0.5-2.5 (3) MG/3ML SOLN Take 3 mLs by nebulization every  6 (six) hours as needed. Patient taking differently: Take 3 mLs by nebulization every 6 (six) hours as needed (COPD).  10/27/18  Yes Trixie Dredge, PA-C  losartan (COZAAR) 25 MG tablet Take 1 tablet (25 mg total) by mouth every morning. Patient taking differently: Take 25 mg by mouth at bedtime.  06/25/19  Yes Buford Dresser, MD  Alcohol Swabs (B-D SINGLE USE SWABS REGULAR) PADS Use to check glucose daily Patient taking differently: 1 each by Other route See admin  instructions. Use to check glucose daily 09/07/19   Luetta Nutting, DO  Blood Glucose Monitoring Suppl (TRUE METRIX AIR GLUCOSE METER) DEVI Dx DM E11.9 Check fasting blood sugar every morning and once 2 hours after largest meal of the day. Patient taking differently: 1 each by Other route See admin instructions. Dx DM E11.9 Check fasting blood sugar every morning and once 2 hours after largest meal of the day. 09/13/19   Luetta Nutting, DO  glucose blood (TRUE METRIX BLOOD GLUCOSE TEST) test strip Use as instructed Patient taking differently: 1 each by Other route as directed.  09/07/19   Luetta Nutting, DO  omeprazole (PRILOSEC) 20 MG capsule Take 1 capsule (20 mg total) by mouth daily. 07/18/19 08/17/19  Ethelda Chick, PA-C  Respiratory Therapy Supplies (NEBULIZER/TUBING/MOUTHPIECE) KIT Use as directed with nebulizer treatments (Duoneb) every 4 hours prn. Dx: J44.9 Patient taking differently: 1 each by Other route See admin instructions. Use as directed with nebulizer treatments (Duoneb) every 4 hours prn. Dx: J44.9 06/20/18   Trixie Dredge, PA-C  Tiotropium Bromide-Olodaterol (STIOLTO RESPIMAT) 2.5-2.5 MCG/ACT AERS Inhale 2 puffs into the lungs daily. Patient not taking: Reported on 11/26/2019 10/27/18   Trixie Dredge, PA-C  TRUEplus Lancets 30G MISC Use to check glucose daily Patient taking differently: 1 each by Other route daily.  09/07/19   Luetta Nutting, DO     Family History  Problem Relation Age of Onset  . Hypertension Mother   . Hypertension Father     Social History   Socioeconomic History  . Marital status: Divorced    Spouse name: Not on file  . Number of children: Not on file  . Years of education: Not on file  . Highest education level: Not on file  Occupational History  . Not on file  Tobacco Use  . Smoking status: Former Smoker    Packs/day: 1.00    Years: 10.00    Pack years: 10.00    Types: Cigarettes    Quit date: 10/12/2002    Years  since quitting: 17.1  . Smokeless tobacco: Never Used  Vaping Use  . Vaping Use: Never used  Substance and Sexual Activity  . Alcohol use: Not Currently  . Drug use: Never  . Sexual activity: Not Currently    Birth control/protection: Abstinence, Surgical  Other Topics Concern  . Not on file  Social History Narrative  . Not on file   Social Determinants of Health   Financial Resource Strain:   . Difficulty of Paying Living Expenses:   Food Insecurity:   . Worried About Charity fundraiser in the Last Year:   . Arboriculturist in the Last Year:   Transportation Needs:   . Film/video editor (Medical):   [name] Lack of Transportation (Non-Medical):   Physical Activity:   . Days of Exercise per Week:   . Minutes of Exercise per Session:   Stress:   . Feeling of Stress :   Social  Connections:   . Frequency of Communication with Friends and Family:   . Frequency of Social Gatherings with Friends and Family:   . Attends Religious Services:   . Active Member of Clubs or Organizations:   . Attends Archivist Meetings:   [name] Marital Status:     Review of Systems: A 12 point ROS discussed and pertinent positives are indicated in the HPI above.  All other systems are negative.  Review of Systems  Constitutional: Negative for fever.  HENT: Negative for congestion.   Respiratory: Negative for cough and shortness of breath.   Cardiovascular: Negative for chest pain.  Gastrointestinal: Negative for abdominal pain.  Neurological: Negative for headaches.  Psychiatric/Behavioral: Negative for behavioral problems and confusion.    Vital Signs: BP (!) 170/91 (BP Location: Left Arm)   Pulse 79   Temp 98 F (36.7 C) (Oral)   Resp 18   SpO2 100%   Physical Exam Vitals and nursing note reviewed.  Constitutional:      Appearance: He is well-developed.  HENT:     Head: Normocephalic.  Cardiovascular:     Rate and Rhythm: Normal rate and regular rhythm.     Heart sounds:  Normal heart sounds.  Pulmonary:     Effort: Pulmonary effort is normal.     Breath sounds: Normal breath sounds.  Musculoskeletal:        General: Normal range of motion.     Cervical back: Normal range of motion.  Skin:    General: Skin is dry.  Neurological:     Mental Status: He is alert and oriented to person, place, and time.     Comments: Alert, aware and oriented X 3 Comprehension is intact. Patient has dysarthria PERRL bilaterally Left sided facial droop noted Tongue midline Can spontaneously move all 4 extremities. Hand grip strength equal bilaterally. Left upper extremity drift.  Dorsiflexion 5/5 bilaterally. Plantar flection 5/5 bilaterally.  Fine motor and coordination slow but intact.   Negativepronator drift. Fine motor and coordinationgrossly in tact Gaitnot assessed Rombergnot assessed Heel to toenot assessed Distal pulsesnot assessed   Psychiatric:        Behavior: Behavior normal.     Imaging: CT HEAD WO CONTRAST  Result Date: 11/26/2019 CLINICAL DATA:  Left-sided facial droop with slurred speech EXAM: CT HEAD WITHOUT CONTRAST TECHNIQUE: Contiguous axial images were obtained from the base of the skull through the vertex without intravenous contrast. COMPARISON:  None. FINDINGS: Brain: There is no mass, hemorrhage or extra-axial collection. The size and configuration of the ventricles and extra-axial CSF spaces are normal. There is hypoattenuation of the white matter, most commonly indicating chronic small vessel disease. Old right MCA territory infarct is unchanged. There are old small vessel infarcts of the left basal ganglia and right thalamus. Vascular: No abnormal hyperdensity of the major intracranial arteries or dural venous sinuses. No intracranial atherosclerosis. Skull: The visualized skull base, calvarium and extracranial soft tissues are normal. Sinuses/Orbits: No fluid levels or advanced mucosal thickening of the visualized paranasal sinuses.  No mastoid or middle ear effusion. The orbits are normal. IMPRESSION: 1. No acute intracranial abnormality. 2. Old right MCA territory infarct and chronic small vessel disease. Electronically Signed   By: Ulyses Jarred M.D.   On: 11/26/2019 00:56   MR ANGIO HEAD WO CONTRAST  Result Date: 11/26/2019 CLINICAL DATA:  Right MCA stroke. History of right carotid endarterectomy. Right carotid aneurysm. EXAM: MRA NECK WITHOUT AND WITH CONTRAST MRA HEAD WITHOUT CONTRAST TECHNIQUE: Multiplanar  and multiecho pulse sequences of the neck were obtained without and with intravenous contrast. Angiographic images of the neck were obtained using MRA technique without and with intravenous contast.; Angiographic images of the Circle of Willis were obtained using MRA technique without intravenous contrast. CONTRAST:  57m GADAVIST GADOBUTROL 1 MMOL/ML IV SOLN COMPARISON:  MRI head 11/26/2019 FINDINGS: MRA NECK FINDINGS Aortic arch normal. Proximal great vessels widely patent. There is a moderate to severe stenosis of the origin of the right subclavian artery with diffuse disease distal to this. Left subclavian artery widely patent. Right carotid bifurcation widely patent. By history prior endarterectomy. Approximately 3 cm distal to the bifurcation, there is an area of moderate to severe focal stenosis in the internal carotid artery with adjacent 11 mm aneurysm. This may be a pseudoaneurysm from prior endarterectomy. In addition, there is mild to moderate stenosis of the cervical internal carotid artery below the skull base presumably atherosclerotic. Left carotid bifurcation widely patent. Approximately 50% diameter stenosis proximal left internal carotid artery above the bifurcation. Mild atherosclerotic disease proximal right vertebral artery without significant stenosis. Mild stenosis distal right vertebral artery. Right vertebral artery patent to the basilar Small left vertebral artery which appears to end in PICA. MRA HEAD  FINDINGS Right vertebral artery is patent to the basilar. Right PICA patent. Left vertebral artery is small and ends in PICA. Mild atherosclerotic irregularity of the distal basilar without significant stenosis. Diffuse disease in the right posterior cerebral artery with moderate to severe stenosis proximally and in the P2 segment. Fetal origin of the left posterior cerebral artery which appears widely patent. Atherosclerotic disease in the cavernous carotid bilaterally. Moderate stenosis on the right and mild to moderate stenosis on the left. Moderate stenosis proximal right A1 segment. Right M1 segment widely patent. Mild stenosis in the superior division of the right middle cerebral artery at the origin. Left anterior cerebral artery patent with mild to moderate atherosclerotic disease. Moderate stenosis in the superior division of the left middle cerebral artery. Negative for cerebral aneurysm. IMPRESSION: 1. By history, right carotid endarterectomy. Right carotid bifurcation patent. 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 Mild to moderate stenosis of the distal cervical internal carotid artery 2. 50% diameter stenosis proximal left internal carotid artery above the bifurcation 3. Right vertebral artery supplies the basilar. Small left vertebral artery ends in PICA 4. Moderate intracranial atherosclerotic disease without large vessel occlusion. 5. Moderate to severe stenosis proximal right subclavian artery. Electronically Signed   By: CFranchot GalloM.D.   On: 11/26/2019 15:26   MR ANGIO NECK W WO CONTRAST  Result Date: 11/26/2019 CLINICAL DATA:  Right MCA stroke. History of right carotid endarterectomy. Right carotid aneurysm. EXAM: MRA NECK WITHOUT AND WITH CONTRAST MRA HEAD WITHOUT CONTRAST TECHNIQUE: Multiplanar and multiecho pulse sequences of the neck were  obtained without and with intravenous contrast. Angiographic images of the neck were obtained using MRA technique without and with intravenous contast.; Angiographic images of the Circle of Willis were obtained using MRA technique without intravenous contrast. CONTRAST:  729mGADAVIST GADOBUTROL 1 MMOL/ML IV SOLN COMPARISON:  MRI head 11/26/2019 FINDINGS: MRA NECK FINDINGS Aortic arch normal. Proximal great vessels widely patent. There is a moderate to severe stenosis of the origin of the right subclavian artery with diffuse disease distal to this. Left subclavian artery widely patent. Right carotid bifurcation widely patent. By history prior  endarterectomy. Approximately 3 cm distal to the bifurcation, there is an area of moderate to severe focal stenosis in the internal carotid artery with adjacent 11 mm aneurysm. This may be a pseudoaneurysm from prior endarterectomy. In addition, there is mild to moderate stenosis of the cervical internal carotid artery below the skull base presumably atherosclerotic. Left carotid bifurcation widely patent. Approximately 50% diameter stenosis proximal left internal carotid artery above the bifurcation. Mild atherosclerotic disease proximal right vertebral artery without significant stenosis. Mild stenosis distal right vertebral artery. Right vertebral artery patent to the basilar Small left vertebral artery which appears to end in PICA. MRA HEAD FINDINGS Right vertebral artery is patent to the basilar. Right PICA patent. Left vertebral artery is small and ends in PICA. Mild atherosclerotic irregularity of the distal basilar without significant stenosis. Diffuse disease in the right posterior cerebral artery with moderate to severe stenosis proximally and in the P2 segment. Fetal origin of the left posterior cerebral artery which appears widely patent. Atherosclerotic disease in the cavernous carotid bilaterally. Moderate stenosis on the right and mild to moderate stenosis on the  left. Moderate stenosis proximal right A1 segment. Right M1 segment widely patent. Mild stenosis in the superior division of the right middle cerebral artery at the origin. Left anterior cerebral artery patent with mild to moderate atherosclerotic disease. Moderate stenosis in the superior division of the left middle cerebral artery. Negative for cerebral aneurysm. IMPRESSION: 1. By history, right carotid endarterectomy. Right carotid bifurcation patent. 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 Mild to moderate stenosis of the distal cervical internal carotid artery 2. 50% diameter stenosis proximal left internal carotid artery above the bifurcation 3. Right vertebral artery supplies the basilar. Small left vertebral artery ends in PICA 4. Moderate intracranial atherosclerotic disease without large vessel occlusion. 5. Moderate to severe stenosis proximal right subclavian artery. Electronically Signed   By: Franchot Gallo M.D.   On: 11/26/2019 15:26   MR BRAIN WO CONTRAST  Result Date: 11/26/2019 CLINICAL DATA:  Neuro deficit. EXAM: MRI HEAD WITHOUT CONTRAST TECHNIQUE: Multiplanar, multiecho pulse sequences of the brain and surrounding structures were obtained without intravenous contrast. COMPARISON:  11/26/2019 head CT.  01/16/2019 MRI head. FINDINGS: Please note image quality is mildly degraded by motion artifact. Brain: Focal restricted diffusion involving the right frontal and parietal regions with correlative T2/FLAIR hyperintense signal. Sequela of remote right MCA territory, bilateral occipital and right temporal insults. Remote lacunar infarcts involving the right corona radiata, bilateral basal ganglia, midbrain and right thalamus. Tiny remote left cerebellar infarct. Moderate diffuse parenchymal volume loss with ex vacuo dilatation. Remote tiny right thalamic  microhemorrhage. No midline shift, mass lesion or extra-axial fluid collection. Scattered and confluent background white matter T2/FLAIR hyperintense foci are nonspecific however commonly associated with chronic microvascular ischemic changes. Vascular: Grossly normal flow voids. Tortuous basilar artery and right V4 segment abutting the right anterolateral medulla. Skull and upper cervical spine: Normal marrow signal. Upper cervical spondylosis. Sinuses/Orbits: Sequela of bilateral lens replacement. Mild ethmoid sinus mucosal thickening. No mastoid effusion. Other: None. IMPRESSION: Acute infarcts involving the right frontal and parietal lobes. Remote infarcts involving the right MCA territory, bilateral occipital, right temporal region and left cerebellum. Remote lacunar infarcts involving the right corona radiata, bilateral basal ganglia, midbrain and right thalamus. Moderate cerebral atrophy. Advanced chronic microvascular ischemic changes. These results were called by telephone at the time  of interpretation on 11/26/2019 at 10:21 am to provider [facility] , who verbally acknowledged these results. Electronically Signed   By: Primitivo Gauze M.D.   On: 11/26/2019 10:25    Labs:  CBC: Recent Labs    07/11/19 0930 07/11/19 0930 08/24/19 1114 11/26/19 0004 11/26/19 0034 11/27/19 0313  WBC 8.8  --  8.3 13.9*  --  11.6*  HGB 13.2   < > 12.6* 12.5* 13.3 11.1*  HCT 40.8   < > 38.6 38.5* 39.0 34.1*  PLT 363  --  337 315  --  276   < > = values in this interval not displayed.    COAGS: Recent Labs    11/26/19 0004  INR 1.0  APTT 31    BMP: Recent Labs    06/14/19 0948 07/11/19 0930 08/24/19 1114 11/26/19 0004 11/26/19 0034 11/27/19 0313  NA   < > 139 137 138 142 141  K   < > 4.6 4.9 4.3 4.4 4.2  CL   < > 110 106 107 108 113*  CO2  --  24 22 21*  --  22  GLUCOSE   < > 132* 186* 117* 113* 130*  BUN   < > 24* 29* 39* 39* 34*  CALCIUM  --  8.7* 9.5 9.3  --  8.5*  CREATININE    < > 1.25* 1.34* 1.79* 1.80* 1.50*  GFRNONAA  --  55* 50* 35*  --  44*  GFRAA  --  >60 58* 41*  --  51*   < > = values in this interval not displayed.    LIVER FUNCTION TESTS: Recent Labs    01/12/19 1652 08/24/19 1114 11/26/19 0004  BILITOT 0.3 0.7 1.0  AST 25 16 20   ALT 17 11 17   ALKPHOS  --   --  91  PROT 7.0 7.4 7.5  ALBUMIN  --   --  3.8   Assessment and Plan:  79 y.o, male inpatient. History of HTN, HLD, DM, CKD, chronic alcohol abuse, CAD s/p CABG, CVA approximately 15 years ago s/p right carotid endartectomy approximately 5 years ago at OSH and bilateral carotid artery stenosis (Vascular US from 3.8.2021). Patient presented to the emergency department  with daughter with symptoms of  dysarthria, slurred speech and left sided facial droop X  3 days.  MR brain from 7.26.21 reads Acute infarcts involving the right frontal and parietal lobes  Team is requesting cerebral angiogram.  WBC is 11.6, BUN 34, Cr 1.50 All labs and medications are within acceptable parameters. Patient is afebrile.   Thank you for this interesting consult.  I greatly enjoyed meeting WILLIAM SCHAKE and look forward to participating in their care.  A copy of this report was sent to the requesting provider on this date.  Risks and benefits of cerebral angiogram were discussed with the patient including, but not limited to bleeding, infection, vascular injury or contrast induced renal failure.  This interventional procedure involves the use of X-rays and because of the nature of the planned procedure, it is possible that we will have prolonged use of X-ray fluoroscopy.  Potential radiation risks to you include (but are not limited to) the following: - A slightly elevated risk for cancer  several years later in life. This risk is typically less than 0.5% percent. This risk is low in comparison to the normal incidence of human cancer, which is 33% for women and 50% for men according to the [employer]. -  Radiation induced injury can include skin redness, resembling a rash, tissue breakdown / ulcers and hair loss (which can be temporary or permanent).   The likelihood of either of these occurring depends on the difficulty of the procedure and whether you are sensitive to radiation due to previous procedures, disease, or genetic conditions.   IF your procedure requires a prolonged use of radiation, you will be notified and given written instructions for further action.  It is your responsibility to monitor the irradiated area for the 2 weeks following the procedure and to notify your physician if you are concerned that you have suffered a radiation induced injury.    All of the patient's questions were answered, patient is agreeable to proceed.  Consent signed and in chart.    Electronically Signed: Jacqualine Mau, NP 11/27/2019, 7:35 AM   I spent a total of 40 Minutes    in face to face in clinical consultation, greater than 50% of which was counseling/coordinating care for cerebral angiogram

## 2019-11-28 ENCOUNTER — Inpatient Hospital Stay (HOSPITAL_COMMUNITY): Payer: Medicare HMO

## 2019-11-28 DIAGNOSIS — I6523 Occlusion and stenosis of bilateral carotid arteries: Secondary | ICD-10-CM | POA: Diagnosis not present

## 2019-11-28 DIAGNOSIS — I6521 Occlusion and stenosis of right carotid artery: Secondary | ICD-10-CM | POA: Diagnosis not present

## 2019-11-28 DIAGNOSIS — Z951 Presence of aortocoronary bypass graft: Secondary | ICD-10-CM | POA: Diagnosis not present

## 2019-11-28 DIAGNOSIS — I639 Cerebral infarction, unspecified: Secondary | ICD-10-CM | POA: Diagnosis not present

## 2019-11-28 DIAGNOSIS — E1140 Type 2 diabetes mellitus with diabetic neuropathy, unspecified: Secondary | ICD-10-CM | POA: Diagnosis not present

## 2019-11-28 DIAGNOSIS — I10 Essential (primary) hypertension: Secondary | ICD-10-CM | POA: Diagnosis not present

## 2019-11-28 DIAGNOSIS — Z8673 Personal history of transient ischemic attack (TIA), and cerebral infarction without residual deficits: Secondary | ICD-10-CM | POA: Diagnosis not present

## 2019-11-28 DIAGNOSIS — I2510 Atherosclerotic heart disease of native coronary artery without angina pectoris: Secondary | ICD-10-CM | POA: Diagnosis not present

## 2019-11-28 DIAGNOSIS — I63231 Cerebral infarction due to unspecified occlusion or stenosis of right carotid arteries: Secondary | ICD-10-CM | POA: Diagnosis not present

## 2019-11-28 LAB — BASIC METABOLIC PANEL
Anion gap: 5 (ref 5–15)
BUN: 19 mg/dL (ref 8–23)
CO2: 21 mmol/L — ABNORMAL LOW (ref 22–32)
Calcium: 8.2 mg/dL — ABNORMAL LOW (ref 8.9–10.3)
Chloride: 117 mmol/L — ABNORMAL HIGH (ref 98–111)
Creatinine, Ser: 1.13 mg/dL (ref 0.61–1.24)
GFR calc Af Amer: >60 mL/min (ref >60)
GFR calc non Af Amer: >60 mL/min (ref >60)
Glucose, Bld: 129 mg/dL — ABNORMAL HIGH (ref 70–99)
Potassium: 4 mmol/L (ref 3.5–5.1)
Sodium: 143 mmol/L (ref 135–145)

## 2019-11-28 LAB — GLUCOSE, CAPILLARY
Glucose-Capillary: 84 mg/dL (ref 70–99)
Glucose-Capillary: 74 mg/dL (ref 70–99)
Glucose-Capillary: 141 mg/dL — ABNORMAL HIGH (ref 70–99)
Glucose-Capillary: 142 mg/dL — ABNORMAL HIGH (ref 70–99)
Glucose-Capillary: 98 mg/dL (ref 70–99)
Glucose-Capillary: 309 mg/dL — ABNORMAL HIGH (ref 70–99)

## 2019-11-28 LAB — URINE CULTURE
Culture: >=100000 — AB
Special Requests: NORMAL

## 2019-11-28 MED ORDER — LOSARTAN POTASSIUM 25 MG PO TABS
25.0000 mg | ORAL_TABLET | ORAL | Status: DC
  Administered 2019-11-29: 25 mg via ORAL
  Filled 2019-11-28: qty 1

## 2019-11-28 MED ORDER — IOHEXOL 350 MG/ML SOLN
75.0000 mL | Freq: Once | INTRAVENOUS | Status: AC | PRN
  Administered 2019-11-28: 75 mL via INTRAVENOUS

## 2019-11-28 MED ORDER — LABETALOL HCL 5 MG/ML IV SOLN
10.0000 mg | INTRAVENOUS | Status: DC | PRN
  Administered 2019-11-28: 10 mg via INTRAVENOUS
  Filled 2019-11-28: qty 4

## 2019-11-28 NOTE — Progress Notes (Signed)
STROKE TEAM PROGRESS NOTE   INTERVAL HISTORY No family is at bedside. Pt sitting in chair, having lunch. No acute event overnight. 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  Dr. Oneida Alar is following.  Vitals:   11/28/19 0012 11/28/19 0333 11/28/19 0724 11/28/19 1407  BP: (!) 132/80 (!) 135/65 (!) 186/90 (!) 177/89  Pulse: 90 84 84 91  Resp:  16 16 22   Temp: 98.3 F (36.8 C) 97.7 F (36.5 C) 97.6 F (36.4 C) 97.7 F (36.5 C)  TempSrc: Oral Oral Oral Oral  SpO2: 100% 99% 100% 100%   CBC:  Recent Labs  Lab 11/26/19 0004 11/26/19 0004 11/26/19 0034 11/27/19 0313  WBC 13.9*  --   --  11.6*  NEUTROABS 9.3*  --   --   --   HGB 12.5*   < > 13.3 11.1*  HCT 38.5*   < > 39.0 34.1*  MCV 93.0  --   --  92.9  PLT 315  --   --  276   < > = values in this interval not displayed.   Basic Metabolic Panel:  Recent Labs  Lab 11/27/19 0313 11/28/19 0327  NA 141 143  K 4.2 4.0  CL 113* 117*  CO2 22 21*  GLUCOSE 130* 129*  BUN 34* 19  CREATININE 1.50* 1.13  CALCIUM 8.5* 8.2*   Lipid Panel:  Recent Labs  Lab 11/27/19 0313  CHOL 131  TRIG 75  HDL 40*  CHOLHDL 3.3  VLDL 15  LDLCALC 76   HgbA1c:  Recent Labs  Lab 11/27/19 0313  HGBA1C 6.3*   Urine Drug Screen:  Recent Labs  Lab 11/26/19 0921  LABOPIA NONE DETECTED  COCAINSCRNUR NONE DETECTED  LABBENZ NONE DETECTED  AMPHETMU NONE DETECTED  THCU NONE DETECTED  LABBARB NONE DETECTED    Alcohol Level  Recent Labs  Lab 11/26/19 0852  ETH <10    IMAGING past 24 hours CT ANGIO HEAD W OR WO CONTRAST  Result Date: 11/28/2019 CLINICAL DATA:  Stroke/TIA, assess intracranial arteries. EXAM: CT ANGIOGRAPHY HEAD AND NECK TECHNIQUE: Multidetector CT imaging of the head and neck was performed using the standard protocol during bolus administration of intravenous contrast. Multiplanar CT image reconstructions and MIPs were obtained to evaluate the vascular anatomy. Carotid stenosis  measurements (when applicable) are obtained utilizing NASCET criteria, using the distal internal carotid diameter as the denominator. CONTRAST:  29mL OMNIPAQUE IOHEXOL 350 MG/ML SOLN COMPARISON:  Brain MRI, MRA head and MRA neck 11/26/2019, noncontrast head CT 11/26/2019 FINDINGS: CT HEAD FINDINGS Brain: Stable, moderate generalized parenchymal atrophy. Evolving acute/early subacute ischemic infarct superimposed upon a chronic infarct within the right frontal operculum, not simply changed in extent as compared to the brain MRI of 11/26/2019. No evidence of hemorrhagic conversion. No significant mass effect. An additional small acute/early subacute infarct within the right parietal lobe was better appreciated on the prior MRI of 11/26/2019. Redemonstrated background chronic cortically based infarcts within the right frontal, right parietal, bilateral occipital and right temporal lobes. Redemonstrated chronic lacunar infarcts in the basal ganglia and right thalamus. A tiny chronic lacunar infarct within the inferior left cerebellum was better appreciated on the prior MRI. Stable background advanced chronic small vessel ischemic disease within the cerebral white matter. No extra-axial fluid collection. No evidence of intracranial mass. No midline shift. Vascular: Reported below. Skull: Normal. Negative for fracture or focal lesion. Sinuses: No significant paranasal sinus disease or mastoid effusion at the imaged levels.  Orbits: No acute abnormality. Review of the MIP images confirms the above findings CTA NECK FINDINGS Aortic arch: Standard aortic branching. Atherosclerotic plaque within the visualized aortic arch and proximal major branch vessels of the neck. No hemodynamically significant innominate or proximal subclavian artery stenosis. Right carotid system: CCA and ICA patent within the neck. Prior right carotid endarterectomy. Redemonstrated, approximately 3 cm distal to the bifurcation, there is irregularity of  the proximal ICA with luminal narrowing of 70-80% as compared to the vessel more distally within the neck (series 7, image 185). 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 Redemonstrated mild/moderate narrowing of the distal cervical ICA. Left carotid system: CCA and ICA patent within the neck. Mild calcified plaque at the CCA origin. There is more prominent mixed plaque within the carotid bifurcation and proximal ICA. Stenosis of the proximal ICA of 60-70% (series 7, image 193). Vertebral arteries: The vertebral arteries are patent within the neck. The right vertebral artery is dominant. Sites of moderate stenosis within the V1 right vertebral artery. Nonstenotic plaque at the origin of the left vertebral artery. Skeleton: No acute bony abnormality or aggressive osseous lesion. Cervical spondylosis with multilevel disc space narrowing, posterior disc osteophytes, uncovertebral and facet hypertrophy. Fusion across the C4-C5, C5-C6 and C6-C7 disc spaces. Other neck: Nonspecific cystic lesion within the subcutaneous left upper neck soft tissues measuring 11 mm (series 7, image 25). No cervical lymphadenopathy. Upper chest: No consolidation within the imaged lung apices. Prior median sternotomy. Review of the MIP images confirms the above findings CTA HEAD FINDINGS Anterior circulation: The intracranial internal carotid arteries are patent. Mixed plaque within both vessels. On the right, there are sites of moderate stenosis within the cavernous segment and severe stenosis of the paraclinoid segment. On the left, there is moderate stenosis of the cavernous segment. The M1 middle cerebral arteries are patent without significant stenosis. Mild stenosis within the M1 right MCA. No M2 proximal branch occlusion or high-grade proximal stenosis is identified. The anterior cerebral arteries are patent. Severe focal stenosis at the origin of the A1 right ACA (series 10, image 17). No intracranial aneurysm is  identified. Posterior circulation: The non dominant intracranial left vertebral artery is developmentally diminutive, but patent and terminates as the left PICA. Moderate atherosclerotic narrowing of the V4 right vertebral artery proximal to the right PICA origin. The basilar artery is patent. The posterior cerebral arteries are patent bilaterally. Redemonstrated multifocal atherosclerotic irregularity of the right posterior cerebral artery. Most notably, there are sites of severe stenosis within the P2 right PCA (series 10, image 21). Posterior communicating arteries are hypoplastic or absent bilaterally. Venous sinuses: Within limitations of contrast timing, no convincing thrombus. Anatomic variants: As described Review of the MIP images confirms the above findings IMPRESSION: CT head: 1. Evolving acute/early subacute ischemic infarct within the right frontal operculum superimposed upon a chronic right frontal lobe infarct. This infarct has not significantly changed in extent as compared to the prior brain MRI of 11/26/2019. No evidence of hemorrhagic conversion. No significant mass effect. 2. An additional small acute/early subacute infarct within the right parietal lobe was better appreciated on the prior MRI. 3. Stable background generalized parenchymal atrophy and chronic ischemic changes as outlined. CTA neck: 1. Prior right carotid endarterectomy. 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 Additionally, there is mild/moderate stenosis of the distal right cervical ICA. 2. 60-70% stenosis  of the proximal left ICA. 3. Sites of moderate atherosclerotic narrowing within the dominant V1 right vertebral artery. CTA head: 1. No intracranial large vessel occlusion. 2. Intracranial atherosclerotic disease with multifocal stenoses, most notably as follows. 3. Severe stenosis of the paraclinoid  right ICA. 4. Moderate stenosis of the cavernous internal carotid arteries bilaterally. 5. Severe focal stenosis at the origin of the A1 right anterior cerebral artery. 6. Moderate atherosclerotic narrowing of the V4 right vertebral artery. Of note, the non dominant left vertebral artery terminates as the left PICA. 7. Sites of severe stenosis within the P2 right PCA. Electronically Signed   By: Kellie Simmering DO   On: 11/28/2019 09:45   CT ANGIO NECK W OR WO CONTRAST  Result Date: 11/28/2019 CLINICAL DATA:  Stroke/TIA, assess intracranial arteries. EXAM: CT ANGIOGRAPHY HEAD AND NECK TECHNIQUE: Multidetector CT imaging of the head and neck was performed using the standard protocol during bolus administration of intravenous contrast. Multiplanar CT image reconstructions and MIPs were obtained to evaluate the vascular anatomy. Carotid stenosis measurements (when applicable) are obtained utilizing NASCET criteria, using the distal internal carotid diameter as the denominator. CONTRAST:  64mL OMNIPAQUE IOHEXOL 350 MG/ML SOLN COMPARISON:  Brain MRI, MRA head and MRA neck 11/26/2019, noncontrast head CT 11/26/2019 FINDINGS: CT HEAD FINDINGS Brain: Stable, moderate generalized parenchymal atrophy. Evolving acute/early subacute ischemic infarct superimposed upon a chronic infarct within the right frontal operculum, not simply changed in extent as compared to the brain MRI of 11/26/2019. No evidence of hemorrhagic conversion. No significant mass effect. An additional small acute/early subacute infarct within the right parietal lobe was better appreciated on the prior MRI of 11/26/2019. Redemonstrated background chronic cortically based infarcts within the right frontal, right parietal, bilateral occipital and right temporal lobes. Redemonstrated chronic lacunar infarcts in the basal ganglia and right thalamus. A tiny chronic lacunar infarct within the inferior left cerebellum was better appreciated on the prior MRI. Stable  background advanced chronic small vessel ischemic disease within the cerebral white matter. No extra-axial fluid collection. No evidence of intracranial mass. No midline shift. Vascular: Reported below. Skull: Normal. Negative for fracture or focal lesion. Sinuses: No significant paranasal sinus disease or mastoid effusion at the imaged levels. Orbits: No acute abnormality. Review of the MIP images confirms the above findings CTA NECK FINDINGS Aortic arch: Standard aortic branching. Atherosclerotic plaque within the visualized aortic arch and proximal major branch vessels of the neck. No hemodynamically significant innominate or proximal subclavian artery stenosis. Right carotid system: CCA and ICA patent within the neck. Prior right carotid endarterectomy. Redemonstrated, approximately 3 cm distal to the bifurcation, there is irregularity of the proximal ICA with luminal narrowing of 70-80% as compared to the vessel more distally within the neck (series 7, image 185). 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 Redemonstrated mild/moderate narrowing of the distal cervical ICA. Left carotid system: CCA and ICA patent within the neck. Mild calcified plaque at the CCA origin. There is more prominent mixed plaque within the carotid bifurcation and proximal ICA. Stenosis of the proximal ICA of 60-70% (series 7, image 193). Vertebral arteries: The vertebral arteries are patent within the neck. The right vertebral artery is dominant. Sites of moderate stenosis within the V1 right vertebral artery. Nonstenotic plaque at the origin of the left vertebral artery. Skeleton: No acute bony abnormality or aggressive osseous lesion. Cervical spondylosis with multilevel disc space narrowing, posterior disc osteophytes, uncovertebral and facet hypertrophy. Fusion across the C4-C5, C5-C6 and C6-C7 disc  spaces. Other neck: Nonspecific cystic lesion within the subcutaneous left upper neck soft tissues measuring 11 mm  (series 7, image 25). No cervical lymphadenopathy. Upper chest: No consolidation within the imaged lung apices. Prior median sternotomy. Review of the MIP images confirms the above findings CTA HEAD FINDINGS Anterior circulation: The intracranial internal carotid arteries are patent. Mixed plaque within both vessels. On the right, there are sites of moderate stenosis within the cavernous segment and severe stenosis of the paraclinoid segment. On the left, there is moderate stenosis of the cavernous segment. The M1 middle cerebral arteries are patent without significant stenosis. Mild stenosis within the M1 right MCA. No M2 proximal branch occlusion or high-grade proximal stenosis is identified. The anterior cerebral arteries are patent. Severe focal stenosis at the origin of the A1 right ACA (series 10, image 17). No intracranial aneurysm is identified. Posterior circulation: The non dominant intracranial left vertebral artery is developmentally diminutive, but patent and terminates as the left PICA. Moderate atherosclerotic narrowing of the V4 right vertebral artery proximal to the right PICA origin. The basilar artery is patent. The posterior cerebral arteries are patent bilaterally. Redemonstrated multifocal atherosclerotic irregularity of the right posterior cerebral artery. Most notably, there are sites of severe stenosis within the P2 right PCA (series 10, image 21). Posterior communicating arteries are hypoplastic or absent bilaterally. Venous sinuses: Within limitations of contrast timing, no convincing thrombus. Anatomic variants: As described Review of the MIP images confirms the above findings IMPRESSION: CT head: 1. Evolving acute/early subacute ischemic infarct within the right frontal operculum superimposed upon a chronic right frontal lobe infarct. This infarct has not significantly changed in extent as compared to the prior brain MRI of 11/26/2019. No evidence of hemorrhagic conversion. No  significant mass effect. 2. An additional small acute/early subacute infarct within the right parietal lobe was better appreciated on the prior MRI. 3. Stable background generalized parenchymal atrophy and chronic ischemic changes as outlined. CTA neck: 1. Prior right carotid endarterectomy. 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 Additionally, there is mild/moderate stenosis of the distal right cervical ICA. 2. 60-70% stenosis of the proximal left ICA. 3. Sites of moderate atherosclerotic narrowing within the dominant V1 right vertebral artery. CTA head: 1. No intracranial large vessel occlusion. 2. Intracranial atherosclerotic disease with multifocal stenoses, most notably as follows. 3. Severe stenosis of the paraclinoid right ICA. 4. Moderate stenosis of the cavernous internal carotid arteries bilaterally. 5. Severe focal stenosis at the origin of the A1 right anterior cerebral artery. 6. Moderate atherosclerotic narrowing of the V4 right vertebral artery. Of note, the non dominant left vertebral artery terminates as the left PICA. 7. Sites of severe stenosis within the P2 right PCA. Electronically Signed   By: Kellie Simmering DO   On: 11/28/2019 09:45    PHYSICAL EXAM  Temp:  [97.6 F (36.4 C)-98.3 F (36.8 C)] 97.7 F (36.5 C) (07/28 1407) Pulse Rate:  [73-91] 91 (07/28 1407) Resp:  [16-22] 22 (07/28 1407) BP: (132-186)/(65-90) 177/89 (07/28 1407) SpO2:  [99 %-100 %] 100 % (07/28 1407)  General - Well nourished, well developed, in no apparent distress.  Ophthalmologic - fundi not visualized due to noncooperation.  Cardiovascular - Regular rhythm and rate.  Mental Status -  Level of arousal and orientation to time, place, and person were intact. Language including expression, naming, repetition, comprehension was assessed and found intact.  Mild dysarthria Fund of  Knowledge was assessed and was intact.  Cranial Nerves II - XII - II - Visual field intact OU. III, IV, VI - Extraocular movements intact. V - Facial sensation intact bilaterally. VII - mild left facial droop. VIII - Hearing & vestibular intact bilaterally. X - Palate elevates symmetrically. XI - Chin turning & shoulder shrug intact bilaterally. XII - Tongue protrusion intact.  Motor Strength - The patient's strength was normal in all extremities and pronator drift was absent except chronic right shoulder rotator cuff injury with limited ROM.  Bulk was normal and fasciculations were absent.   Motor Tone - Muscle tone was assessed at the neck and appendages and was normal.  Reflexes - The patient's reflexes were symmetrical in all extremities and he had no pathological reflexes.  Sensory - Light touch, temperature/pinprick were assessed and were symmetrical.    Coordination - The patient had normal movements in the hands with no ataxia or dysmetria. Tremor was absent.  Gait and Station - deferred.   ASSESSMENT/PLAN Kelly Williams is a 79 y.o. male with history of stroke, hypertension, hyperlipidemia, diabetes, carotid disease s/p R CEA, medication noncompliance and alcohol abuse presenting with facial droop and dysarthria following intoxication.   Stroke:   R MCA small infarcts, likely due to R ICA stenosis s/p CEA, workup underway  CT head No acute abnormality. Old R MCA infarct. Small vessel disease. ASPECTS 10.     MRI  R frontal and parietal lobe infarcts. Old R MCA territory, B occipital, R temporal lobe and L cerebellar infarcts. Old R corona radiata, B basal ganglia, midbrain and R thalamic lacunes.   MRA head & neck  R CEA w/ bifurcation patent. Focal stenosis 3cm distal to bifurcation w/ adjacent 38mm aneurysm. 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 Distal cervical R ICA w/ mild to moderate stenosis. L ICA proximal to bifurcation 50%. Moderate intracranial  atherosclerosis. R subclavian w/ moderate to severe stenosis.   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  Mild to moderate stenosis of distal right cervical ICA.  60 to 70% proximal ICA stenosis.  Severe stenosis right ICA paraclinoid area, moderate stenosis cavernous ICA bilaterally.  Right A1 and P2 severe focal stenosis.  Right V4 moderate stenosis.  2D Echo EF 50-55%. No source of embolus   LDL 76  HgbA1c 6.3  VTE prophylaxis - Lovenox 30 mg sq daily   aspirin 81 mg daily prior to admission, now on aspirin 325 mg daily and clopidogrel 75 mg daily.  Therapy recommendations:  OP SLP  Disposition:  pending - daughter works 10 hours a day  Carotid disease, bilateral   Hx R CEA 10 years ago and L CEA 7 years ago  MRA neck  R CEA w/ bifurcation patent. Focal stenosis 3cm distal to bifurcation w/ adjacent 62mm aneurysm. 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 Distal cervical R ICA w/ mild to moderate stenosis. L ICA proximal to bifurcation 50%.   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  Mild to moderate stenosis of distal right cervical ICA.  60 to 70% proximal ICA stenosis.  Severe stenosis right ICA paraclinoid area, moderate stenosis cavernous ICA bilaterally.   [facility] onboard (Dr. Oneida Alar)  May consider right CEA re-do  Hypertension   Stable 150-170s . Permissive hypertension (OK if < 220/120) but gradually lower in 5-7 days . Long-term BP goal 130-150  Hyperlipidemia  Home meds:  lipitor 40  Now on lipitor 80  LDL 76, goal < 70  Continue statin at discharge  Diabetes type II, insulin dependent, Controlled  HgbA1c 6.3, goal < 7.0  CBGs   SSI  Close PCP follow up  Chronic ETOH abuse  Intoxication just prior to admission   alcohol level <10  CIWA protocol  advised  to drink no more than 2 drink(s) a day  Other Stroke Risk Factors  Advanced age  Former Cigarette smoker, quit 17 yrs ago  Hx stroke/TIA  Coronary artery disease s/p CABG x 5  Chronic systolic cardiomyopathy   Other Active Problems  COPD  AKI on CKD stage III. Cre 1.79-1.8-1.5->1.13  Peripheral neuropathy on Neurontin   Milk leukocytosis, WBC 13.9-11.6, thought to be reactive  GERD on PPI  Hospital day # 1  Rosalin Hawking, MD PhD Stroke Neurology 11/28/2019 3:41 PM  To contact Stroke Continuity provider, please refer to [email or web address] After hours, contact General Neurology

## 2019-11-28 NOTE — Progress Notes (Signed)
Physical Therapy Treatment Patient Details Name: Kelly Williams MRN: [id number] DOB: 02-04-1941 Today's Date: 11/28/2019    History of Present Illness Pt is a 79 y/o male with PMH of stroke, HTN, DM, periphreal neuropathy, carotid disease with L carotid endarterectomy, CABG x 5, R shoulder arthroplasthy, medication noncompliance and alcohol abuse. Daughter found patient after falling and intoxicated, L facial droop, slurring speech and aggression (reports hx of mulitple falls). MRI reveals acute ischemic infarcts of R frontal and parietal lobes. MRA of neck revealed focal stenosis in the right internal carotid artery approximately 3 cm distal to the bifurcation with an adjacent 11 mm aneurysmal dilatation.      PT Comments    Pt ambulated great hallway distance with use of RW and intermittent min assist to maintain balance/navigate hallway obstacles. PT trialled [facility] this session, pt very unsafe with cane use and stands in high guard position for balance so RW safest AD this day. PT challenged pt gait during hallway navigation, tolerated well but pt fatigues with challenge. PT to continue to progress mobility as able.    Follow Up Recommendations  Home health PT;Supervision for mobility/OOB;Supervision/Assistance - 24 hour     Equipment Recommendations  None recommended by PT    Recommendations for Other Services       Precautions / Restrictions Precautions Precautions: Fall Precaution Comments: CIWA  Restrictions Weight Bearing Restrictions: No    Mobility  Bed Mobility Overal bed mobility: Needs Assistance Bed Mobility: Sit to Supine     Supine to sit: Supervision;HOB elevated Sit to supine: Supervision;HOB elevated   General bed mobility comments: received in recliner, supervision for return to supine for lines/leads.  Transfers Overall transfer level: Needs assistance Equipment used: Rolling walker (2 wheeled) Transfers: Sit to/from Stand Sit to Stand: Min guard          General transfer comment: for safety, x2 from edge of chair and toilet  Ambulation/Gait Ambulation/Gait assistance: Min assist;Min guard Gait Distance (Feet): 300 Feet Assistive device: Rolling walker (2 wheeled) Gait Pattern/deviations: Step-through pattern;Decreased stride length;Staggering right;Leaning posteriorly;Narrow base of support;Drifts right/left;Ataxic Gait velocity: decr   General Gait Details: Min guard for safety, frequent min assist to steady especially when challenging dynamic standing balance. Pt drifts L and R, staggers R when distracted. Attempted ambulation with cane, but pt stands in high guard position with unsupported UE and very unsafe this day.   Stairs             Wheelchair Mobility    Modified Rankin (Stroke Patients Only) Modified Rankin (Stroke Patients Only) Pre-Morbid Rankin Score: Moderate disability Modified Rankin: Moderately severe disability     Balance Overall balance assessment: Needs assistance Sitting-balance support: No upper extremity supported;Feet supported Sitting balance-Leahy Scale: Good Sitting balance - Comments: min guard for safety   Standing balance support: Bilateral upper extremity supported;During functional activity;No upper extremity supported Standing balance-Leahy Scale: Fair Standing balance comment: Pt ambulating with RW but able to wash hands at sink without any LOB             High level balance activites: Head turns;Direction changes High Level Balance Comments: Repeated horizontal and vertical head turns with emphasis on maintaining straight trajectory            Cognition Arousal/Alertness: Awake/alert Behavior During Therapy: [facility] for tasks assessed/performed;Impulsive Overall Cognitive Status: No family/caregiver present to determine baseline cognitive functioning Area of Impairment: Attention;Memory;Following commands;Safety/judgement;Awareness;Problem solving  Current Attention Level: Sustained Memory: Decreased short-term memory Following Commands: Follows one step commands consistently;Follows one step commands with increased time;Follows multi-step commands consistently Safety/Judgement: Decreased awareness of safety Awareness: Emergent Problem Solving: Slow processing;Decreased initiation;Difficulty sequencing;Requires verbal cues General Comments: Pt with short attention span, seconds only. Stands without assist even with multiple PT cues to remain sitting until PT able to assist. Poor safety awareness in hallway, but does well with verbal cuing.      Exercises      General Comments General comments (skin integrity, edema, etc.): Pt with improved memory, balance, and safety awareness during session.      Pertinent Vitals/Pain Pain Assessment: No/denies pain    Home Living                      Prior Function            PT Goals (current goals can now be found in the care plan section) Acute Rehab PT Goals Patient Stated Goal: to get back home  PT Goal Formulation: With patient Time For Goal Achievement: 12/11/19 Potential to Achieve Goals: Good Progress towards PT goals: Progressing toward goals    Frequency    Min 4X/week      PT Plan Current plan remains appropriate    Co-evaluation              AM-PAC PT "6 Clicks" Mobility   Outcome Measure  Help needed turning from your back to your side while in a flat bed without using bedrails?: None Help needed moving from lying on your back to sitting on the side of a flat bed without using bedrails?: None Help needed moving to and from a bed to a chair (including a wheelchair)?: A Little Help needed standing up from a chair using your arms (e.g., wheelchair or bedside chair)?: A Little Help needed to walk in hospital room?: A Little Help needed climbing 3-5 steps with a railing? : A Lot 6 Click Score: 19    End of Session Equipment Utilized During  Treatment: Gait belt Activity Tolerance: Patient tolerated treatment well Patient left: with call bell/phone within reach;in bed;with family/visitor present (instructed pt's daughter to ask RN to set bed alarm before she leaves) Nurse Communication: Mobility status PT Visit Diagnosis: Muscle weakness (generalized) (M62.81);History of falling (Z91.81)     Time: 2334-3568 PT Time Calculation (min) (ACUTE ONLY): 18 min  Charges:  $Gait Training: 8-22 mins                    Alexias Margerum E, PT [place] Pager [phone]  Office [phone]    Zebulin Siegel D Elonda Husky 11/28/2019, 5:52 PM

## 2019-11-28 NOTE — Progress Notes (Signed)
°   11/28/19 2300  Provider Notification  Provider Name/Title Dr Cyd Silence  Date Provider Notified 11/28/19  Time Provider Notified 2302  Notification Type Page  Notification Reason Other (Comment) (Pt has had 3 loose stools tonight)  Response No new orders (Advised to give ordered Pepto-bismol)  Date of Provider Response 11/28/19  Time of Provider Response 2320

## 2019-11-28 NOTE — Plan of Care (Signed)
  Problem: Self-Care: Goal: Ability to participate in self-care as condition permits will improve Outcome: Progressing   Problem: Activity: Goal: Risk for activity intolerance will decrease Outcome: Progressing   Problem: Nutrition: Goal: Adequate nutrition will be maintained Outcome: Progressing

## 2019-11-28 NOTE — Progress Notes (Signed)
Occupational Therapy Treatment Patient Details Name: Kelly Williams MRN: [id number] DOB: [birth year] Today's Date: 11/28/2019    History of present illness Pt is a 79 y/o male with PMH of stroke, HTN, DM, periphreal neuropathy, carotid disease with L carotid endarterectomy, CABG x 5, R shoulder arthroplasthy, medication noncompliance and alcohol abuse. Daughter found patient after falling and intoxicated, L facial droop, slurring speech and aggression (reports hx of mulitple falls). MRI reveals acute ischemic infarcts of R frontal and parietal lobes. MRA of neck revealed focal stenosis in the right internal carotid artery approximately 3 cm distal to the bifurcation with an adjacent 11 mm aneurysmal dilatation.     OT comments  Upon arrival pt up in bed and agreeable to skilled-OT session. Pt seeming more aware today talking to therapist about upcoming procedure and follow 3 step multi-step commands. Reviewed 3/3 fall prevention tips and pt was able to remember 3/3 with cues. Pt needing verbal cues to scan and read signs for directions for mobility in hallways. Completed mobility with RW, transfer to tub with tub bench, and grooming at sink with min guard assist. Pt demonstrating increased safety awareness stating that he like the tub bench better than his shower seat with handles so he doesn't have to step over the tub (which has caused falls before) and the standard RW with 2 wheels compared to his rollator because he feels more steady and safe. Therapist explained that she could set those recommendations up upon dc. Pt reported he still has not heard from family about 24/7 assist at home upon dc. Strongly recommend 24/7 assist/supervision at home upon dc - if going home. If no confirmation of 24/7 support, will continue to recommend SNF due to unsteadiness and safety awareness.    Follow Up Recommendations  Home health OT;Supervision/Assistance - 24 hour;SNF    Equipment Recommendations  Tub/shower  bench;Other (comment) (standard RW)       Precautions / Restrictions Precautions Precautions: Fall Precaution Comments: CIWA        Mobility Bed Mobility Overal bed mobility: Needs Assistance Bed Mobility: Supine to Sit     Supine to sit: Supervision;HOB elevated     General bed mobility comments: for safety, line mgmt   Transfers Overall transfer level: Needs assistance Equipment used: Rolling walker (2 wheeled) Transfers: Sit to/from Stand Sit to Stand: Min guard         General transfer comment: min guard for safety and balance    Balance Overall balance assessment: Needs assistance Sitting-balance support: No upper extremity supported;Feet supported Sitting balance-Leahy Scale: Good Sitting balance - Comments: min guard for safety   Standing balance support: Bilateral upper extremity supported;During functional activity;No upper extremity supported Standing balance-Leahy Scale: Fair Standing balance comment: Pt ambulating with RW but able to wash hands at sink without any LOB                           ADL either performed or assessed with clinical judgement   ADL Overall ADL's : Needs assistance/impaired     Grooming: Min guard;Standing;Wash/dry hands Grooming Details (indicate cue type and reason): wash hands with min guard and RW                         Tub/ Shower Transfer: Tub bench;Rolling walker;Ambulation;Cueing for safety;Min guard;Tub transfer Tub/Shower Transfer Details (indicate cue type and reason): Pt able to complete transfer safely with tub bench with RW.  Cues need for safety. Pt with better safety awareness stating that he likes the tub bench that hangs over the edge of the tub to decrease falls Functional mobility during ADLs: Min guard;Cueing for safety;Cueing for sequencing;Rolling walker                 Cognition Arousal/Alertness: Awake/alert Behavior During Therapy: WFL for tasks assessed/performed Overall  Cognitive Status: No family/caregiver present to determine baseline cognitive functioning Area of Impairment: Attention;Memory;Following commands;Safety/judgement;Awareness;Problem solving                   Current Attention Level: Sustained Memory: Decreased short-term memory Following Commands: Follows one step commands consistently;Follows one step commands with increased time;Follows multi-step commands consistently Safety/Judgement: Decreased awareness of safety Awareness: Emergent Problem Solving: Slow processing;Decreased initiation;Difficulty sequencing;Requires verbal cues General Comments: Pt presenting better today with better attention and STM. Pt able to follow 3 step multi-step commands. Reviewed 3/3 fall prevention tips and pt was able to remember 3/3 with cues. Pt needing verbal cues to scan and read signs for directions for mobility in hallways.              General Comments Pt with improved memory, balance, and safety awareness during session.    Pertinent Vitals/ Pain       Pain Assessment: No/denies pain         Frequency  Min 2X/week        Progress Toward Goals  OT Goals(current goals can now be found in the care plan section)  Progress towards OT goals: Progressing toward goals  Acute Rehab OT Goals Patient Stated Goal: to get back home  OT Goal Formulation: With patient Time For Goal Achievement: 12/11/19 Potential to Achieve Goals: Good  Plan Discharge plan remains appropriate       AM-PAC OT "6 Clicks" Daily Activity     Outcome Measure   Help from another person eating meals?: None Help from another person taking care of personal grooming?: A Little Help from another person toileting, which includes using toliet, bedpan, or urinal?: A Little Help from another person bathing (including washing, rinsing, drying)?: A Little Help from another person to put on and taking off regular upper body clothing?: A Little Help from another  person to put on and taking off regular lower body clothing?: A Little 6 Click Score: 19    End of Session Equipment Utilized During Treatment: Gait belt;Rolling walker  OT Visit Diagnosis: Unsteadiness on feet (R26.81);Repeated falls (R29.6);Muscle weakness (generalized) (M62.81);History of falling (Z91.81);Other symptoms and signs involving cognitive function   Activity Tolerance Patient tolerated treatment well   Patient Left in bed;with call bell/phone within reach;with bed alarm set   Nurse Communication Mobility status        Time: 8850-2774 OT Time Calculation (min): 18 min  Charges: OT General Charges $OT Visit: 1 Visit OT Treatments $Self Care/Home Management : 8-22 mins  Malaiya Paczkowski/OTS   Jiyah Torpey 11/28/2019, 3:06 PM

## 2019-11-28 NOTE — TOC Initial Note (Signed)
Transition of Care [facility]) - Initial/Assessment Note    Patient Details  Name: Kelly Williams MRN: [id number] Date of Birth: 1940/07/09  [facility] CM/SW Contact:    Joanne Chars, LCSW Phone Number: 11/28/2019, 3:01 PM  Clinical Narrative:     CSW met with pt to discuss follow up care.  Pt currently has outpt PT in place but does not have transportation in place now and is agreeable to [facility] services.  Choice document provided and pt requested [facility], however, [facility] not able to accept pt.  Discussed 3 in 1 equipment recommendation.  Pt reports he current has a shower chair already and is able to walk to the bathroom, refused 3 in 1.  Pt did request a walker.                Expected Discharge Plan: [place] Barriers to Discharge: Continued Medical Work up   Patient Goals and CMS Choice Patient states their goals for this hospitalization and ongoing recovery are:: improve enough to drive again CMS Medicare.gov Compare Post Acute Care list provided to:: Patient Choice offered to / list presented to : Patient  Expected Discharge Plan and Services Expected Discharge Plan: [place] Choice: Home Health, Durable Medical Equipment Living arrangements for the past 2 months: Single Family Home                 DME Arranged: Walker rolling DME Agency: [employer] Date DME Agency Contacted: 11/28/19   Representative spoke with at DME Agency: [place]            Prior Living Arrangements/Services Living arrangements for the past 2 months: [place] Lives with:: Adult Children Patient language and need for interpreter reviewed:: No Do you feel safe going back to the place where you live?: Yes      Need for Family Participation in Patient Care: No (Comment) Care giver support system in place?: Yes (comment) (daughter)   Criminal Activity/Legal Involvement Pertinent to Current Situation/Hospitalization:  No - Comment as needed  Activities of Daily Living      Permission Sought/Granted Permission sought to share information with : Customer service manager, Other (comment) Permission granted to share information with : Yes, Verbal Permission Granted     Permission granted to share info w AGENCY: [facility]        Emotional Assessment Appearance:: Appears stated age Attitude/Demeanor/Rapport: Engaged Affect (typically observed): Pleasant Orientation: : Oriented to Self, Oriented to Place, Oriented to  Time, Oriented to Situation Alcohol / Substance Use: Alcohol Use Psych Involvement: No (comment)  Admission diagnosis:  CVA (cerebral vascular accident) ([place]) [I63.9] Stroke [facility]) [I63.9] Acute CVA (cerebrovascular accident) [facility]) [I63.9] Patient Active Problem List   Diagnosis Date Noted  . Acute CVA (cerebrovascular accident) ([place]) 11/27/2019  . CVA (cerebral vascular accident) ([place]) 11/26/2019  . Unintentional weight loss 08/24/2019  . History of CVA (cerebrovascular accident) 06/25/2019  . Bruit 06/25/2019  . Dizziness 06/14/2019  . Essential hypertension 06/14/2019  . Injury of left shoulder 05/30/2019  . Rotator cuff tear, right 05/30/2019  . Loose stools 02/04/2019  . Gait disturbance 02/04/2019  . Cervical spondylosis 01/17/2019  . Normocytic anemia 01/16/2019  . Trigger finger, right ring finger 10/27/2018  . Renal insufficiency 06/29/2018  . At moderate risk for fall 06/29/2018  . Ambulates with cane 06/29/2018  . Memory difficulties 06/02/2018  . Diabetic autonomic neuropathy associated with type 2  diabetes mellitus ([place]) 06/02/2018  . Status post lumbar spinal fusion 06/02/2018  . S/P CABG x 5 06/02/2018  . Chronic obstructive pulmonary disease ([place]) 06/02/2018  . Shortness of breath 06/02/2018  . Diabetic nephropathy associated with type 2 diabetes mellitus ([place]) 06/02/2018  . Type 2 diabetes mellitus with diabetic neuropathy, unspecified ([place]) 06/02/2018  .  Tachycardia with heart rate 100-120 beats per minute 06/02/2018  . Acromioclavicular arthrosis 06/02/2018  . Diabetic retinopathy ([place])   . Coronary artery disease   . Carotid arterial disease ([place])    PCP:  Luetta Nutting, DO Pharmacy:   [place], [place] [address] [place] [address] Phone: [phone] Fax: [phone]     Social Determinants of Health (SDOH) Interventions    Readmission Risk Interventions No flowsheet data found.

## 2019-11-28 NOTE — Progress Notes (Signed)
[facility] [place]  Subjective  - feels ok   Objective (!) 186/90 84 97.6 F (36.4 C) (Oral) 16 100%  Intake/Output Summary (Last 24 hours) at 11/28/2019 0836 Last data filed at 11/27/2019 2344 Gross per 24 hour  Intake 1921.47 ml  Output 500 ml  Net 1421.47 ml   Still with facial droop and mild slurred speech  Assessment/Planning: No new neuro events CTA head neck today Will follow up with him regarding this tomorrow  Ruta Hinds 11/28/2019 8:36 AM --  Laboratory Lab Results: Recent Labs    11/26/19 0004 11/26/19 0004 11/26/19 0034 11/27/19 0313  WBC 13.9*  --   --  11.6*  HGB 12.5*   < > 13.3 11.1*  HCT 38.5*   < > 39.0 34.1*  PLT 315  --   --  276   < > = values in this interval not displayed.   BMET Recent Labs    11/27/19 0313 11/28/19 0327  NA 141 143  K 4.2 4.0  CL 113* 117*  CO2 22 21*  GLUCOSE 130* 129*  BUN 34* 19  CREATININE 1.50* 1.13  CALCIUM 8.5* 8.2*    COAG Lab Results  Component Value Date   INR 1.0 11/26/2019   No results found for: PTT

## 2019-11-28 NOTE — Progress Notes (Signed)
PROGRESS NOTE    Kelly Williams  ERD:[id number] DOB: 23-Dec-1940 DOA: 11/25/2019 PCP: Luetta Nutting, DO   Brief Narrative:  Patient is a 79 year old male with history of hypertension, hyperlipidemia, diabetes mellitus type 2, COPD, CKD, chronic alcohol abuse, coronary disease status post CABG, post CABG CVA who presents with dysarthria.  .  Patient was brought to the emergency department for the concern of persistent dysarthria.  Patient is also noncompliant with his medications.  On presentation, he was hypertensive.  MRI of the brain showed acute right frontal/right infarcts in addition to old infarcts.  Neurology, vascular surgery and IR consulted and following.Underwent CTA head and neck today  Assessment & Plan:   Principal Problem:   CVA (cerebral vascular accident) ([place]) Active Problems:   Coronary artery disease   Carotid arterial disease ([facility])   S/P CABG x 5   Chronic obstructive pulmonary disease ([place])   Type 2 diabetes mellitus with diabetic neuropathy, unspecified ([place])   Ambulates with cane   Essential hypertension   Acute CVA (cerebrovascular accident) ([place])   Acute nonhemorrhagic CVA: CT head revealed old infarcts but MRI showed acute infarct involving the right frontal/frontal lobes.  Neurology consulted.  Currently on aspirin, Plavix and statin.  Speech/PT/OT evaluation done.  Hemoglobin A1c of 6.3.  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 PT/OT recommending home health.  Bilateral carotid artery stenosis:Vascular ultrasound from March 2021 showed right ICA are consistent with a 40-59% stenosis. Heavy plaque burden noted in the mid ICA, s/p  history of CEA. Non-hemodynamically significant plaque <50% noted in the  CCA. Left  Carotid: Velocities in the left ICA are consistent with a 60-79% stenosis .  He is a status post right carotid endarterectomy. MRA neck showed focal stenosis on the right, possible pseudoaneurysm versus dissection.  Vascular surgery also following, carotid duplex ultrasound done on this admission showed 40 to 59% stenosis in right ICA and 60 to 79% stenosis in the left ICA.CTA neck as above  Hypertension: Monitor blood pressure.  Continue current medications.  Allow permissive hypertension in the setting of acute stroke, use as needed medications for severe hypertension  AKI on CKD status 3A: Baseline creatinine around 1.3.  Kidney function has normalized today.  Coronary artery disease: Status post CABG.  Has history of chronic systolic cardiomyopathy.  Echo done in February 2021 showed EF of 40 to 45%.  New echocardiogram done here showed ejection fraction 50 to 56%, grade 1 diastolic dysfunction, no intracardiac source of emboli..  Chronic alcohol abuse: Counseled for cessation.  Monitor for withdrawal  Peripheral neuropathy: On Neurontin.  Insulin-dependent diabetes type 2: A1c of 6.3.  On weekly Trulicity at home.  Continue sliding scale insulin.  Leukocytosis: Mild, most likely reactive.  COPD: Stable.  Continue home regimen  GERD: Continue PPI         DVT prophylaxis:Lovenox Code Status: Full Family Communication: daughter on phone on 11/27/19 Status is: inpatient  Dispo: The patient is from: Home              Anticipated d/c is to: Home              Anticipated d/c date is: 2 days              Patient currently  is not medically stable to d/c.  Needs vascular surgery and  neurology clearance before discharge.   Consultants: Vascular surgery, neurology, IR  Procedures: None  Antimicrobials:  Anti-infectives (From admission, onward)   None      Subjective: Patient seen and examined at the bedside this morning.  Hemodynamically stable.  Comfortable.  Denies any  complaints.  His speech is more clear today.  No focal neurological deficits.    Objective: Vitals:   11/27/19 1944 11/28/19 0012 11/28/19 0333 11/28/19 0724  BP: (!) 183/81 (!) 132/80 (!) 135/65 (!) 186/90  Pulse: 73 90 84 84  Resp: 18  16 16   Temp: 97.6 F (36.4 C) 98.3 F (36.8 C) 97.7 F (36.5 C) 97.6 F (36.4 C)  TempSrc: Oral Oral Oral Oral  SpO2: 99% 100% 99% 100%    Intake/Output Summary (Last 24 hours) at 11/28/2019 0805 Last data filed at 11/27/2019 2344 Gross per 24 hour  Intake 1921.47 ml  Output 500 ml  Net 1421.47 ml   There were no vitals filed for this visit.  Examination:   General exam: Appears calm and comfortable ,Not in distress,average built HEENT:PERRL,Oral mucosa moist, Ear/Nose normal on gross exam Respiratory system: Bilateral equal air entry, normal vesicular breath sounds, no wheezes or crackles  Cardiovascular system: S1 & S2 heard, RRR. No JVD, murmurs, rubs, gallops or clicks. Gastrointestinal system: Abdomen is nondistended, soft and nontender. No organomegaly or masses felt. Normal bowel sounds heard. Central nervous system: Alert and oriented. No focal neurological deficits. Extremities: No edema, no clubbing ,no cyanosis Skin: No rashes, lesions or ulcers,no icterus ,no pallor  Data Reviewed: I have personally reviewed following labs and imaging studies  CBC: Recent Labs  Lab 11/26/19 0004 11/26/19 0034 11/27/19 0313  WBC 13.9*  --  11.6*  NEUTROABS 9.3*  --   --   HGB 12.5* 13.3 11.1*  HCT 38.5* 39.0 34.1*  MCV 93.0  --  92.9  PLT 315  --  465   Basic Metabolic Panel: Recent Labs  Lab 11/26/19 0004 11/26/19 0034 11/27/19 0313 11/28/19 0327  NA 138 142 141 143  K 4.3 4.4 4.2 4.0  CL 107 108 113* 117*  CO2 21*  --  22 21*  GLUCOSE 117* 113* 130* 129*  BUN 39* 39* 34* 19  CREATININE 1.79* 1.80* 1.50* 1.13  CALCIUM 9.3  --  8.5* 8.2*   GFR: CrCl cannot be calculated (Unknown ideal weight.). Liver Function  Tests: Recent Labs  Lab 11/26/19 0004  AST 20  ALT 17  ALKPHOS 91  BILITOT 1.0  PROT 7.5  ALBUMIN 3.8   No results for input(s): LIPASE, AMYLASE in the last 168 hours. No results for input(s): AMMONIA in the last 168 hours. Coagulation Profile: Recent Labs  Lab 11/26/19 0004  INR 1.0   Cardiac Enzymes: No results for input(s): CKTOTAL, CKMB, CKMBINDEX, TROPONINI in the last 168 hours. BNP (last 3 results) No results for input(s): PROBNP in the last 8760 hours. HbA1C: Recent Labs    11/27/19 0313  HGBA1C 6.3*   CBG: Recent Labs  Lab 11/27/19 1134 11/27/19 1619 11/27/19 2136 11/28/19 0610 11/28/19 0729  GLUCAP 136* 152* 201* 84 74   Lipid Profile: Recent Labs    11/27/19 0313  CHOL 131  HDL 40*  LDLCALC 76  TRIG 75  CHOLHDL 3.3   Thyroid Function Tests: No results for input(s): TSH, T4TOTAL, FREET4, T3FREE, THYROIDAB in the last 72 hours. Anemia Panel: No results for input(s): VITAMINB12,  FOLATE, FERRITIN, TIBC, IRON, RETICCTPCT in the last 72 hours. Sepsis Labs: No results for input(s): PROCALCITON, LATICACIDVEN in the last 168 hours.  Recent Results (from the past 240 hour(s))  SARS Coronavirus 2 by RT PCR (hospital order, performed in [facility] hospital lab) Nasopharyngeal Nasopharyngeal Swab     Status: None   Collection Time: 11/26/19 12:32 PM   Specimen: Nasopharyngeal Swab  Result Value Ref Range Status   SARS Coronavirus 2 NEGATIVE NEGATIVE Final    Comment: (NOTE) SARS-CoV-2 target nucleic acids are NOT DETECTED.  The SARS-CoV-2 RNA is generally detectable in upper and lower respiratory specimens during the acute phase of infection. The lowest concentration of SARS-CoV-2 viral copies this assay can detect is 250 copies / mL. A negative result does not preclude SARS-CoV-2 infection and should not be used as the sole basis for treatment or other patient management decisions.  A negative result may occur with improper specimen collection /  handling, submission of specimen other than nasopharyngeal swab, presence of viral mutation(s) within the areas targeted by this assay, and inadequate number of viral copies (<250 copies / mL). A negative result must be combined with clinical observations, patient history, and epidemiological information.  Fact Sheet for Patients:   StrictlyIdeas.no  Fact Sheet for Healthcare Providers: BankingDealers.co.za  This test is not yet approved or  cleared by the [place] FDA and has been authorized for detection and/or diagnosis of SARS-CoV-2 by FDA under an Emergency Use Authorization (EUA).  This EUA will remain in effect (meaning this test can be used) for the duration of the COVID-19 declaration under Section 564(b)(1) of the Act, 21 U.S.C. section 360bbb-3(b)(1), unless the authorization is terminated or revoked sooner.  Performed at [facility], [place] [address], [place], [place] [address]   Urine culture     Status: Abnormal   Collection Time: 11/26/19  1:00 PM   Specimen: Urine, Random  Result Value Ref Range Status   Specimen Description URINE, RANDOM  Final   Special Requests   Final    Normal Performed at [facility], [place] [address], [place], [place] [address]    Culture >=100,000 COLONIES/mL PROTEUS MIRABILIS (A)  Final   Report Status 11/28/2019 FINAL  Final   Organism ID, Bacteria PROTEUS MIRABILIS (A)  Final      Susceptibility   Proteus mirabilis - MIC*    AMPICILLIN >=32 RESISTANT Resistant     CEFAZOLIN >=64 RESISTANT Resistant     CIPROFLOXACIN <=0.25 SENSITIVE Sensitive     GENTAMICIN <=1 SENSITIVE Sensitive     NITROFURANTOIN 128 RESISTANT Resistant     TRIMETH/SULFA <=20 SENSITIVE Sensitive     AMPICILLIN/SULBACTAM <=2 SENSITIVE Sensitive     PIP/TAZO <=4 SENSITIVE Sensitive     * >=100,000 COLONIES/mL PROTEUS MIRABILIS         Radiology Studies: MR ANGIO HEAD WO CONTRAST  Result  Date: 11/26/2019 CLINICAL DATA:  Right MCA stroke. History of right carotid endarterectomy. Right carotid aneurysm. EXAM: MRA NECK WITHOUT AND WITH CONTRAST MRA HEAD WITHOUT CONTRAST TECHNIQUE: Multiplanar and multiecho pulse sequences of the neck were obtained without and with intravenous contrast. Angiographic images of the neck were obtained using MRA technique without and with intravenous contast.; Angiographic images of the Circle of Willis were obtained using MRA technique without intravenous contrast. CONTRAST:  21mL GADAVIST GADOBUTROL 1 MMOL/ML IV SOLN COMPARISON:  MRI head 11/26/2019 FINDINGS: MRA NECK FINDINGS Aortic arch normal. Proximal great vessels widely patent. There is  a moderate to severe stenosis of the origin of the right subclavian artery with diffuse disease distal to this. Left subclavian artery widely patent. Right carotid bifurcation widely patent. By history prior endarterectomy. Approximately 3 cm distal to the bifurcation, there is an area of moderate to severe focal stenosis in the internal carotid artery with adjacent 11 mm aneurysm. This may be a pseudoaneurysm from prior endarterectomy. In addition, there is mild to moderate stenosis of the cervical internal carotid artery below the skull base presumably atherosclerotic. Left carotid bifurcation widely patent. Approximately 50% diameter stenosis proximal left internal carotid artery above the bifurcation. Mild atherosclerotic disease proximal right vertebral artery without significant stenosis. Mild stenosis distal right vertebral artery. Right vertebral artery patent to the basilar Small left vertebral artery which appears to end in PICA. MRA HEAD FINDINGS Right vertebral artery is patent to the basilar. Right PICA patent. Left vertebral artery is small and ends in PICA. Mild atherosclerotic irregularity of the distal basilar without significant stenosis. Diffuse disease in the right posterior cerebral artery with moderate to severe  stenosis proximally and in the P2 segment. Fetal origin of the left posterior cerebral artery which appears widely patent. Atherosclerotic disease in the cavernous carotid bilaterally. Moderate stenosis on the right and mild to moderate stenosis on the left. Moderate stenosis proximal right A1 segment. Right M1 segment widely patent. Mild stenosis in the superior division of the right middle cerebral artery at the origin. Left anterior cerebral artery patent with mild to moderate atherosclerotic disease. Moderate stenosis in the superior division of the left middle cerebral artery. Negative for cerebral aneurysm. IMPRESSION: 1. By history, right carotid endarterectomy. Right carotid bifurcation patent. 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 Mild to moderate stenosis of the distal cervical internal carotid artery 2. 50% diameter stenosis proximal left internal carotid artery above the bifurcation 3. Right vertebral artery supplies the basilar. Small left vertebral artery ends in PICA 4. Moderate intracranial atherosclerotic disease without large vessel occlusion. 5. Moderate to severe stenosis proximal right subclavian artery. Electronically Signed   By: Franchot Gallo M.D.   On: 11/26/2019 15:26   MR ANGIO NECK W WO CONTRAST  Result Date: 11/26/2019 CLINICAL DATA:  Right MCA stroke. History of right carotid endarterectomy. Right carotid aneurysm. EXAM: MRA NECK WITHOUT AND WITH CONTRAST MRA HEAD WITHOUT CONTRAST TECHNIQUE: Multiplanar and multiecho pulse sequences of the neck were obtained without and with intravenous contrast. Angiographic images of the neck were obtained using MRA technique without and with intravenous contast.; Angiographic images of the Circle of Willis were obtained using MRA technique without intravenous contrast. CONTRAST:  94mL GADAVIST  GADOBUTROL 1 MMOL/ML IV SOLN COMPARISON:  MRI head 11/26/2019 FINDINGS: MRA NECK FINDINGS Aortic arch normal. Proximal great vessels widely patent. There is a moderate to severe stenosis of the origin of the right subclavian artery with diffuse disease distal to this. Left subclavian artery widely patent. Right carotid bifurcation widely patent. By history prior endarterectomy. Approximately 3 cm distal to the bifurcation, there is an area of moderate to severe focal stenosis in the internal carotid artery with adjacent 11 mm aneurysm. This may be a pseudoaneurysm from prior endarterectomy. In addition, there is mild to moderate stenosis of the cervical internal carotid artery below the skull base presumably atherosclerotic. Left carotid bifurcation widely patent. Approximately 50% diameter stenosis proximal left internal carotid artery above the  bifurcation. Mild atherosclerotic disease proximal right vertebral artery without significant stenosis. Mild stenosis distal right vertebral artery. Right vertebral artery patent to the basilar Small left vertebral artery which appears to end in PICA. MRA HEAD FINDINGS Right vertebral artery is patent to the basilar. Right PICA patent. Left vertebral artery is small and ends in PICA. Mild atherosclerotic irregularity of the distal basilar without significant stenosis. Diffuse disease in the right posterior cerebral artery with moderate to severe stenosis proximally and in the P2 segment. Fetal origin of the left posterior cerebral artery which appears widely patent. Atherosclerotic disease in the cavernous carotid bilaterally. Moderate stenosis on the right and mild to moderate stenosis on the left. Moderate stenosis proximal right A1 segment. Right M1 segment widely patent. Mild stenosis in the superior division of the right middle cerebral artery at the origin. Left anterior cerebral artery patent with mild to moderate atherosclerotic disease. Moderate stenosis in the  superior division of the left middle cerebral artery. Negative for cerebral aneurysm. IMPRESSION: 1. By history, right carotid endarterectomy. Right carotid bifurcation patent. 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 Mild to moderate stenosis of the distal cervical internal carotid artery 2. 50% diameter stenosis proximal left internal carotid artery above the bifurcation 3. Right vertebral artery supplies the basilar. Small left vertebral artery ends in PICA 4. Moderate intracranial atherosclerotic disease without large vessel occlusion. 5. Moderate to severe stenosis proximal right subclavian artery. Electronically Signed   By: Franchot Gallo M.D.   On: 11/26/2019 15:26   MR BRAIN WO CONTRAST  Result Date: 11/26/2019 CLINICAL DATA:  Neuro deficit. EXAM: MRI HEAD WITHOUT CONTRAST TECHNIQUE: Multiplanar, multiecho pulse sequences of the brain and surrounding structures were obtained without intravenous contrast. COMPARISON:  11/26/2019 head CT.  01/16/2019 MRI head. FINDINGS: Please note image quality is mildly degraded by motion artifact. Brain: Focal restricted diffusion involving the right frontal and parietal regions with correlative T2/FLAIR hyperintense signal. Sequela of remote right MCA territory, bilateral occipital and right temporal insults. Remote lacunar infarcts involving the right corona radiata, bilateral basal ganglia, midbrain and right thalamus. Tiny remote left cerebellar infarct. Moderate diffuse parenchymal volume loss with ex vacuo dilatation. Remote tiny right thalamic microhemorrhage. No midline shift, mass lesion or extra-axial fluid collection. Scattered and confluent background white matter T2/FLAIR hyperintense foci are nonspecific however commonly associated with chronic microvascular ischemic changes. Vascular: Grossly normal flow voids.  Tortuous basilar artery and right V4 segment abutting the right anterolateral medulla. Skull and upper cervical spine: Normal marrow signal. Upper cervical spondylosis. Sinuses/Orbits: Sequela of bilateral lens replacement. Mild ethmoid sinus mucosal thickening. No mastoid effusion. Other: None. IMPRESSION: Acute infarcts involving the right frontal and parietal lobes. Remote infarcts involving the right MCA territory, bilateral occipital, right temporal region and left cerebellum. Remote lacunar infarcts involving the right corona radiata, bilateral basal ganglia, midbrain and right thalamus. Moderate cerebral atrophy. Advanced chronic microvascular ischemic changes. These results were called by telephone at the time of interpretation on 11/26/2019 at 10:21 am to provider [facility] , who verbally acknowledged these results. Electronically Signed   By: Primitivo Gauze M.D.   On: 11/26/2019 10:25   IR PATIENT EVAL TECH 0-60 MINS  Result Date: 11/27/2019 Rosann Auerbach, RT     11/27/2019  9:19 AM Case could not be attempted due kidney function and conflicting referrals with vascular surgery.  ECHOCARDIOGRAM COMPLETE  Result Date: 11/27/2019    ECHOCARDIOGRAM REPORT   Patient Name:   LARUE LIGHTNER Date of Exam: 11/27/2019 Medical Rec #:  [id number]      Height:       66.0 in Accession #:    [id number]     Weight:       123.0 lb Date of Birth:  Jul 31, 1940      BSA:          1.627 m Patient Age:    74 years       BP:           173/86 mmHg Patient Gender: M              HR:           79 bpm. Exam Location:  Inpatient Procedure: 2D Echo Indications:    Stroke 434.91 / I163.9  History:        Patient has prior history of Echocardiogram examinations, most                 recent 06/27/2019. CAD; Risk Factors:Hypertension, Diabetes and                 Dyslipidemia. Carotid artery disease. Chronic kidney disease.  Sonographer:    Darlina Sicilian RDCS Referring Phys: [id number] [place]  1. Left  ventricular ejection fraction, by estimation, is 50 to 55%. The left ventricle has low normal function. The left ventricle has no regional wall motion abnormalities. Left ventricular diastolic parameters are consistent with Grade I diastolic dysfunction (impaired relaxation).  2. Right ventricular systolic function is normal. The right ventricular size is normal.  3. The mitral valve is normal in structure. Mild mitral valve regurgitation. No evidence of mitral stenosis.  4. The aortic valve is tricuspid. Aortic valve regurgitation is not visualized. Mild to moderate aortic valve sclerosis/calcification is present, without any evidence of aortic stenosis.  5. The inferior vena cava is normal in size with greater than 50% respiratory variability, suggesting right atrial pressure of 3 mmHg. Conclusion(s)/Recommendation(s): No intracardiac source of embolism detected on this transthoracic study. A transesophageal echocardiogram is recommended to exclude cardiac source of embolism if clinically indicated. FINDINGS  Left Ventricle: Left ventricular ejection fraction, by estimation, is 50 to 55%. The left ventricle has low normal function. The left ventricle has no regional wall motion abnormalities. The left ventricular internal cavity size was normal in size. There is no left ventricular hypertrophy. Left ventricular diastolic parameters are consistent with Grade I diastolic dysfunction (impaired relaxation). Right Ventricle: The right ventricular size is normal. No increase in right ventricular wall thickness. Right ventricular systolic function is normal. Left Atrium: Left atrial size was normal in size. Right Atrium: Right atrial size was normal in size. Pericardium: There is no evidence of pericardial effusion. Mitral Valve: The mitral valve is normal in structure. Normal mobility of the mitral valve leaflets. Mild mitral valve regurgitation. No evidence of mitral valve stenosis. Tricuspid Valve: The tricuspid valve  is normal in structure. Tricuspid valve regurgitation is not demonstrated. No evidence of tricuspid stenosis. Aortic Valve: The aortic valve is tricuspid. Aortic valve regurgitation is not visualized. Mild to moderate aortic valve sclerosis/calcification is present, without any evidence of aortic stenosis. Pulmonic Valve: The pulmonic valve was normal in structure. Pulmonic valve regurgitation is not visualized. No evidence of pulmonic stenosis. Aorta: The aortic root is normal in size and structure. Venous: The inferior vena cava is normal in size with greater than 50%  respiratory variability, suggesting right atrial pressure of 3 mmHg. IAS/Shunts: No atrial level shunt detected by color flow Doppler.  LEFT VENTRICLE PLAX 2D LVIDd:         4.00 cm  Diastology LV IVS:        1.60 cm  LV e' lateral:   6.83 cm/s LVOT diam:     2.10 cm  LV E/e' lateral: 10.1 LV SV:         56       LV e' medial:    4.12 cm/s LV SV Index:   35       LV E/e' medial:  16.7 LVOT Area:     3.46 cm  RIGHT VENTRICLE TAPSE (M-mode): 1.0 cm LEFT ATRIUM             Index       RIGHT ATRIUM           Index LA Vol (A2C):   43.0 ml 26.44 ml/m RA Area:     11.60 cm LA Vol (A4C):   49.5 ml 30.43 ml/m RA Volume:   22.20 ml  13.65 ml/m LA Biplane Vol: 47.6 ml 29.27 ml/m  AORTIC VALVE LVOT Vmax:   77.70 cm/s LVOT Vmean:  56.900 cm/s LVOT VTI:    0.163 m  AORTA Ao Root diam: 3.70 cm MITRAL VALVE MV Area (PHT): 4.06 cm     SHUNTS MV Decel Time: 187 msec     Systemic VTI:  0.16 m MV E velocity: 69.00 cm/s   Systemic Diam: 2.10 cm MV A velocity: 112.00 cm/s MV E/A ratio:  0.62 Candee Furbish MD Electronically signed by Candee Furbish MD Signature Date/Time: 11/27/2019/12:39:33 PM    Final    VAS US CAROTID  Result Date: 11/27/2019 Carotid Arterial Duplex Study Indications:       CVA. Risk Factors:      Hypertension, hyperlipidemia, Diabetes, prior CVA. Comparison Study:  Prior study 07-09-2019 RT ICA 40-59%, LT ICA 60-79% Performing Technologist: Darlin Coco  Examination Guidelines: A complete evaluation includes B-mode imaging, spectral Doppler, color Doppler, and power Doppler as needed of all accessible portions of each vessel. Bilateral testing is considered an integral part of a complete examination. Limited examinations for reoccurring indications may be performed as noted.  Right Carotid Findings: +----------+--------+--------+--------+-------------------+--------------------+           PSV cm/sEDV cm/sStenosisPlaque Description Comments             +----------+--------+--------+--------+-------------------+--------------------+ CCA Prox  82      8               heterogenous                            +----------+--------+--------+--------+-------------------+--------------------+ CCA Distal88      8               heterogenous                            +----------+--------+--------+--------+-------------------+--------------------+ ICA Prox  223     26      40-59%  heterogenous and   Velocities may                                         hypoechoic         underestimate  degree                                                      of stenosis due to                                                        more proximal                                                             obstruction.         +----------+--------+--------+--------+-------------------+--------------------+ ICA Mid   146     18                                                      +----------+--------+--------+--------+-------------------+--------------------+ ICA Distal66      11                                                      +----------+--------+--------+--------+-------------------+--------------------+ ECA       127                                                             +----------+--------+--------+--------+-------------------+--------------------+  +----------+--------+-------+----------------+-------------------+           PSV cm/sEDV cmsDescribe        Arm Pressure (mmHG) +----------+--------+-------+----------------+-------------------+ [id number]            Multiphasic, WNL                    +----------+--------+-------+----------------+-------------------+ +---------+--------+---+--------+--+---------+ VertebralPSV cm/s136EDV cm/s13Antegrade +---------+--------+---+--------+--+---------+  Left Carotid Findings: +----------+--------+--------+--------+-------------------+--------------------+           PSV cm/sEDV cm/sStenosisPlaque Description Comments             +----------+--------+--------+--------+-------------------+--------------------+ CCA Prox  100     16              heterogenous                            +----------+--------+--------+--------+-------------------+--------------------+ CCA Distal96      12                                                      +----------+--------+--------+--------+-------------------+--------------------+ ICA Prox  257     60  60-79%  calcific and       Velocities may                                         diffuse            underestimate degree                                                      of stenosis due to                                                        more proximal                                                             obstruction.         +----------+--------+--------+--------+-------------------+--------------------+ ICA Mid   234     39                                                      +----------+--------+--------+--------+-------------------+--------------------+ ICA Distal77      22                                                      +----------+--------+--------+--------+-------------------+--------------------+ ECA       111                                                              +----------+--------+--------+--------+-------------------+--------------------+ +----------+--------+--------+----------------+-------------------+           PSV cm/sEDV cm/sDescribe        Arm Pressure (mmHG) +----------+--------+--------+----------------+-------------------+ Subclavian122     97      Multiphasic, WNL                    +----------+--------+--------+----------------+-------------------+ +---------+--------+--+--------+-+---------+ VertebralPSV cm/s52EDV cm/s8Antegrade +---------+--------+--+--------+-+---------+   Summary: Right Carotid: Velocities in the right ICA are consistent with a 40-59%                stenosis. Non-hemodynamically significant plaque <50% noted in                the CCA. Left Carotid: Velocities in the left ICA are consistent with a 60-79% stenosis.               Non-hemodynamically significant plaque <50% noted in the CCA. Vertebrals:  Bilateral vertebral arteries demonstrate antegrade flow. Subclavians: Normal flow hemodynamics were seen in bilateral subclavian              arteries. *See table(s) above for measurements and observations.  Electronically signed by Deitra Mayo MD on 11/27/2019 at 2:49:34 PM.    Final         Scheduled Meds: . aspirin EC  325 mg Oral Daily  . atorvastatin  80 mg Oral QHS  . clopidogrel  75 mg Oral Daily  . enoxaparin (LOVENOX) injection  30 mg Subcutaneous Q24H  . gabapentin  300 mg Oral TID  . insulin aspart  0-15 Units Subcutaneous TID WC  . insulin glargine  10 Units Subcutaneous QHS  . pantoprazole  40 mg Oral Daily   Continuous Infusions: . sodium chloride 100 mL/hr at 11/28/19 0417     LOS: 1 day    Time spent: 35 mins.More than 50% of that time was spent in counseling and/or coordination of care.      Shelly Coss, MD [facility] P7/28/2021, 8:05 AM

## 2019-11-29 ENCOUNTER — Other Ambulatory Visit: Payer: Self-pay

## 2019-11-29 DIAGNOSIS — I63231 Cerebral infarction due to unspecified occlusion or stenosis of right carotid arteries: Secondary | ICD-10-CM | POA: Diagnosis not present

## 2019-11-29 DIAGNOSIS — Z951 Presence of aortocoronary bypass graft: Secondary | ICD-10-CM | POA: Diagnosis not present

## 2019-11-29 DIAGNOSIS — I10 Essential (primary) hypertension: Secondary | ICD-10-CM | POA: Diagnosis not present

## 2019-11-29 DIAGNOSIS — Z8673 Personal history of transient ischemic attack (TIA), and cerebral infarction without residual deficits: Secondary | ICD-10-CM | POA: Diagnosis not present

## 2019-11-29 DIAGNOSIS — E1140 Type 2 diabetes mellitus with diabetic neuropathy, unspecified: Secondary | ICD-10-CM | POA: Diagnosis not present

## 2019-11-29 DIAGNOSIS — I6523 Occlusion and stenosis of bilateral carotid arteries: Secondary | ICD-10-CM | POA: Diagnosis not present

## 2019-11-29 DIAGNOSIS — I2510 Atherosclerotic heart disease of native coronary artery without angina pectoris: Secondary | ICD-10-CM | POA: Diagnosis not present

## 2019-11-29 DIAGNOSIS — I6521 Occlusion and stenosis of right carotid artery: Secondary | ICD-10-CM | POA: Diagnosis not present

## 2019-11-29 LAB — GLUCOSE, CAPILLARY
Glucose-Capillary: 100 mg/dL — ABNORMAL HIGH (ref 70–99)
Glucose-Capillary: 83 mg/dL (ref 70–99)
Glucose-Capillary: 100 mg/dL — ABNORMAL HIGH (ref 70–99)

## 2019-11-29 LAB — MAGNESIUM: Magnesium: 1.9 mg/dL (ref 1.7–2.4)

## 2019-11-29 LAB — BASIC METABOLIC PANEL
Anion gap: 8 (ref 5–15)
BUN: 24 mg/dL — ABNORMAL HIGH (ref 8–23)
CO2: 19 mmol/L — ABNORMAL LOW (ref 22–32)
Calcium: 8.1 mg/dL — ABNORMAL LOW (ref 8.9–10.3)
Chloride: 113 mmol/L — ABNORMAL HIGH (ref 98–111)
Creatinine, Ser: 1.58 mg/dL — ABNORMAL HIGH (ref 0.61–1.24)
GFR calc Af Amer: 48 mL/min — ABNORMAL LOW (ref >60)
GFR calc non Af Amer: 41 mL/min — ABNORMAL LOW (ref >60)
Glucose, Bld: 103 mg/dL — ABNORMAL HIGH (ref 70–99)
Potassium: 4.1 mmol/L (ref 3.5–5.1)
Sodium: 140 mmol/L (ref 135–145)

## 2019-11-29 MED ORDER — MAGNESIUM SULFATE 50 % IJ SOLN: 3.0000 g | Freq: Once | INTRAVENOUS | Status: DC

## 2019-11-29 MED ORDER — LOPERAMIDE HCL 2 MG PO CAPS: 2.0000 mg | ORAL_CAPSULE | ORAL | 0 refills | Status: DC | PRN

## 2019-11-29 MED ORDER — CLOPIDOGREL BISULFATE 75 MG PO TABS: 75.0000 mg | ORAL_TABLET | Freq: Every day | ORAL | 1 refills | Status: AC

## 2019-11-29 MED ORDER — LOPERAMIDE HCL 2 MG PO CAPS: 2.0000 mg | ORAL_CAPSULE | Freq: Three times a day (TID) | ORAL | 0 refills | Status: AC | PRN

## 2019-11-29 MED ORDER — ATORVASTATIN CALCIUM 80 MG PO TABS: 80.0000 mg | ORAL_TABLET | Freq: Every day | ORAL | 1 refills | Status: AC

## 2019-11-29 MED ORDER — ASPIRIN 325 MG PO TBEC: 325.0000 mg | DELAYED_RELEASE_TABLET | Freq: Every day | ORAL | 1 refills | Status: AC

## 2019-11-29 MED ORDER — LOPERAMIDE HCL 2 MG PO CAPS: 2.0000 mg | ORAL_CAPSULE | ORAL | Status: DC | PRN

## 2019-11-29 MED FILL — ASPIRIN EC 325 MG TABLET: 325 | 100 days supply | Qty: 100 | Fill #0

## 2019-11-29 MED FILL — CLOPIDOGREL 75 MG TABLET: 75 | 30 days supply | Qty: 30 | Fill #0

## 2019-11-29 MED FILL — LOPERAMIDE 2 MG CAPSULE: 2 | 10 days supply | Qty: 30 | Fill #0

## 2019-11-29 MED FILL — ATORVASTATIN 80 MG TABLET: 80 | 30 days supply | Qty: 30 | Fill #0

## 2019-11-29 NOTE — Discharge Summary (Signed)
Physician Discharge Summary  Kelly Williams QIH:[id number] DOB: [birth year] DOA: 11/25/2019  PCP: Luetta Nutting, DO  Admit date: 11/25/2019 Discharge date: 11/29/2019  Admitted From: Home Disposition:  Home  Discharge Condition:Stable CODE STATUS:FULL Diet recommendation: Heart Healthy  Brief/Interim Summary:  Patient is a 79 year old male with history of hypertension, hyperlipidemia, diabetes mellitus type 2, COPD, CKD, chronic alcohol abuse, coronary disease status post CABG, post CABG CVA who presents with dysarthria.  .  Patient was brought to the emergency department for the concern of persistent dysarthria.  Patient is also noncompliant with his medications.  On presentation, he was hypertensive.  MRI of the brain showed acute right frontal/right infarcts in addition to old infarcts.  CTA head and neck showed bilateral carotid artery disease. Neurology and vascular vascular surgery were following.  Vascular surgery wants to redo carotid endarterectomy on right side as an outpatient.  PT/OT recommended home health on discharge.  He is medically stable for discharge to home today.  Following problems were addressed during his hospitalization:  Acute nonhemorrhagic CVA: CT head revealed old infarcts but MRI showed acute infarct involving the right frontal/frontal lobes.  Neurology consulted.  Currently on aspirin, Plavix and statin.  Speech/PT/OT evaluation done.  Hemoglobin A1c of 6.3.  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  Vascular surgery planning to redo right CEA on Tuesday, August 3. PT/OT recommending home health.  Hypertension: Monitor blood pressure.  Continue current medications.    AKI on CKD status 3A: Baseline creatinine  around 1.3.  Kidney function around baseline.  Coronary artery disease: Status post CABG.  Has history of chronic systolic cardiomyopathy.  Echo done in February 2021 showed EF of 40 to 45%.  New echocardiogram done here showed ejection fraction 50 to 87%, grade 1 diastolic dysfunction, no intracardiac source of emboli.  Chronic alcohol abuse: Counseled for cessation.    Peripheral neuropathy: On Neurontin.  Insulin-dependent diabetes type 2: A1c of 6.3.  On weekly Trulicity at home.   Leukocytosis: Mild, most likely reactive.  COPD: Stable.  Continue home regimen  GERD: Continue PPI  Discharge Diagnoses:  Principal Problem:   CVA (cerebral vascular accident) ([place]) Active Problems:   Coronary artery disease   Carotid arterial disease ([place])   S/P CABG x 5   Chronic obstructive pulmonary disease ([place])   Type 2 diabetes mellitus with diabetic neuropathy, unspecified ([place])   Ambulates with cane   Essential hypertension   Acute CVA (cerebrovascular accident) [facility])    Discharge Instructions  Discharge Instructions    Ambulatory referral to Neurology   Complete by: As directed    An appointment is requested in approximately: 4 weeks   Diet - low sodium heart healthy   Complete by: As directed    Discharge instructions   Complete by: As directed    1)Please take prescribed medications as instructed. 2)Follow up with vascular surgery on Tuesday. 3)Follow up with [facility] neurology in 4 weeks.  Name and number of the provider group has been attached.   Increase activity slowly   Complete by: As directed      Allergies as of 11/29/2019      Reactions   Metformin And Related Diarrhea      Medication List    TAKE these medications  acetaminophen 500 MG tablet Commonly known as: TYLENOL Take 1,000 mg by mouth every 6 (six) hours as needed for mild pain.   albuterol 108 (90 Base) MCG/ACT inhaler Commonly known as: VENTOLIN HFA Inhale 1-2 puffs into the lungs  every 4 (four) hours as needed for wheezing or shortness of breath (bronchospasm).   aspirin 325 MG EC tablet Take 1 tablet (325 mg total) by mouth daily. Start taking on: November 30, 2019 What changed:   medication strength  how much to take   atorvastatin 80 MG tablet Commonly known as: LIPITOR Take 1 tablet (80 mg total) by mouth at bedtime. What changed:   medication strength  how much to take   B-D SINGLE USE SWABS REGULAR Pads Use to check glucose daily What changed:   how much to take  how to take this  when to take this   bismuth subsalicylate 329 VB/16OM suspension Commonly known as: PEPTO BISMOL Take 30 mLs by mouth every 6 (six) hours as needed for indigestion or diarrhea or loose stools.   celecoxib 100 MG capsule Commonly known as: CeleBREX Take 1 capsule (100 mg total) by mouth 2 (two) times daily as needed for mild pain. What changed: how much to take   clopidogrel 75 MG tablet Commonly known as: PLAVIX Take 1 tablet (75 mg total) by mouth daily. Start taking on: November 30, 2019   gabapentin 300 MG capsule Commonly known as: NEURONTIN Take 1 capsule (300 mg total) by mouth 3 (three) times daily.   ipratropium-albuterol 0.5-2.5 (3) MG/3ML Soln Commonly known as: DUONEB Take 3 mLs by nebulization every 6 (six) hours as needed. What changed: reasons to take this   loperamide 2 MG capsule Commonly known as: IMODIUM Take 1 capsule (2 mg total) by mouth as needed for diarrhea or loose stools.   losartan 25 MG tablet Commonly known as: COZAAR Take 1 tablet (25 mg total) by mouth every morning. What changed: when to take this   Nebulizer/Tubing/Mouthpiece Kit Use as directed with nebulizer treatments (Duoneb) every 4 hours prn. Dx: J44.9 What changed:   how much to take  how to take this  when to take this   omeprazole 20 MG capsule Commonly known as: PRILOSEC Take 1 capsule (20 mg total) by mouth daily.   Stiolto Respimat 2.5-2.5 MCG/ACT  Aers Generic drug: Tiotropium Bromide-Olodaterol Inhale 2 puffs into the lungs daily.   True Metrix Air Glucose Meter Devi Dx DM E11.9 Check fasting blood sugar every morning and once 2 hours after largest meal of the day. What changed:   how much to take  how to take this  when to take this   True Metrix Blood Glucose Test test strip Generic drug: glucose blood Use as instructed What changed:   how much to take  how to take this  when to take this  additional instructions   TRUEplus Lancets 30G Misc Use to check glucose daily What changed:   how much to take  how to take this  when to take this  additional instructions   Trulicity 6.00 KH/9.9HF Sopn Generic drug: Dulaglutide Inject 0.75 mg into the skin once a week. What changed: additional instructions            Durable Medical Equipment  (From admission, onward)         Start     Ordered   11/28/19 1500  For home use only DME Walker rolling  Once  Question Answer Comment  Walker: With 5 Inch Wheels   Patient needs a walker to treat with the following condition CVA (cerebral vascular accident) ([place])      11/28/19 1500          Follow-up Information    Luetta Nutting, DO. Schedule an appointment as soon as possible for a visit in 1 week(s).   Specialty: Family Medicine Contact information: [address] [place] [address] [phone]        [facility]. Schedule an appointment as soon as possible for a visit in 4 week(s).   Specialty: Neurology Contact information: [place] [phone]             Allergies  Allergen Reactions  . Metformin And Related Diarrhea    Consultations:  Vascular surgery,neurology   Procedures/Studies: CT ANGIO HEAD W OR WO CONTRAST  Result Date: 11/28/2019 CLINICAL DATA:  Stroke/TIA, assess intracranial arteries. EXAM: CT ANGIOGRAPHY HEAD  AND NECK TECHNIQUE: Multidetector CT imaging of the head and neck was performed using the standard protocol during bolus administration of intravenous contrast. Multiplanar CT image reconstructions and MIPs were obtained to evaluate the vascular anatomy. Carotid stenosis measurements (when applicable) are obtained utilizing NASCET criteria, using the distal internal carotid diameter as the denominator. CONTRAST:  73m OMNIPAQUE IOHEXOL 350 MG/ML SOLN COMPARISON:  Brain MRI, MRA head and MRA neck 11/26/2019, noncontrast head CT 11/26/2019 FINDINGS: CT HEAD FINDINGS Brain: Stable, moderate generalized parenchymal atrophy. Evolving acute/early subacute ischemic infarct superimposed upon a chronic infarct within the right frontal operculum, not simply changed in extent as compared to the brain MRI of 11/26/2019. No evidence of hemorrhagic conversion. No significant mass effect. An additional small acute/early subacute infarct within the right parietal lobe was better appreciated on the prior MRI of 11/26/2019. Redemonstrated background chronic cortically based infarcts within the right frontal, right parietal, bilateral occipital and right temporal lobes. Redemonstrated chronic lacunar infarcts in the basal ganglia and right thalamus. A tiny chronic lacunar infarct within the inferior left cerebellum was better appreciated on the prior MRI. Stable background advanced chronic small vessel ischemic disease within the cerebral white matter. No extra-axial fluid collection. No evidence of intracranial mass. No midline shift. Vascular: Reported below. Skull: Normal. Negative for fracture or focal lesion. Sinuses: No significant paranasal sinus disease or mastoid effusion at the imaged levels. Orbits: No acute abnormality. Review of the MIP images confirms the above findings CTA NECK FINDINGS Aortic arch: Standard aortic branching. Atherosclerotic plaque within the visualized aortic arch and proximal major branch vessels of  the neck. No hemodynamically significant innominate or proximal subclavian artery stenosis. Right carotid system: CCA and ICA patent within the neck. Prior right carotid endarterectomy. Redemonstrated, approximately 3 cm distal to the bifurcation, there is irregularity of the proximal ICA with luminal narrowing of 70-80% as compared to the vessel more distally within the neck (series 7, image 185). 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 Redemonstrated mild/moderate narrowing of the distal cervical ICA. Left carotid system: CCA and ICA patent within the neck. Mild calcified plaque at the CCA origin. There is more prominent mixed plaque within the carotid bifurcation and proximal ICA. Stenosis of the proximal ICA of 60-70% (series 7, image 193). Vertebral arteries: The vertebral arteries are patent within the neck. The right vertebral artery is dominant. Sites of moderate stenosis within the V1 right vertebral artery. Nonstenotic plaque at the origin of  the left vertebral artery. Skeleton: No acute bony abnormality or aggressive osseous lesion. Cervical spondylosis with multilevel disc space narrowing, posterior disc osteophytes, uncovertebral and facet hypertrophy. Fusion across the C4-C5, C5-C6 and C6-C7 disc spaces. Other neck: Nonspecific cystic lesion within the subcutaneous left upper neck soft tissues measuring 11 mm (series 7, image 25). No cervical lymphadenopathy. Upper chest: No consolidation within the imaged lung apices. Prior median sternotomy. Review of the MIP images confirms the above findings CTA HEAD FINDINGS Anterior circulation: The intracranial internal carotid arteries are patent. Mixed plaque within both vessels. On the right, there are sites of moderate stenosis within the cavernous segment and severe stenosis of the paraclinoid segment. On the left, there is moderate stenosis of the cavernous segment. The M1 middle cerebral arteries are patent without significant stenosis.  Mild stenosis within the M1 right MCA. No M2 proximal branch occlusion or high-grade proximal stenosis is identified. The anterior cerebral arteries are patent. Severe focal stenosis at the origin of the A1 right ACA (series 10, image 17). No intracranial aneurysm is identified. Posterior circulation: The non dominant intracranial left vertebral artery is developmentally diminutive, but patent and terminates as the left PICA. Moderate atherosclerotic narrowing of the V4 right vertebral artery proximal to the right PICA origin. The basilar artery is patent. The posterior cerebral arteries are patent bilaterally. Redemonstrated multifocal atherosclerotic irregularity of the right posterior cerebral artery. Most notably, there are sites of severe stenosis within the P2 right PCA (series 10, image 21). Posterior communicating arteries are hypoplastic or absent bilaterally. Venous sinuses: Within limitations of contrast timing, no convincing thrombus. Anatomic variants: As described Review of the MIP images confirms the above findings IMPRESSION: CT head: 1. Evolving acute/early subacute ischemic infarct within the right frontal operculum superimposed upon a chronic right frontal lobe infarct. This infarct has not significantly changed in extent as compared to the prior brain MRI of 11/26/2019. No evidence of hemorrhagic conversion. No significant mass effect. 2. An additional small acute/early subacute infarct within the right parietal lobe was better appreciated on the prior MRI. 3. Stable background generalized parenchymal atrophy and chronic ischemic changes as outlined. CTA neck: 1. Prior right carotid endarterectomy. 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 Additionally, there is mild/moderate stenosis of the distal right cervical ICA. 2. 60-70% stenosis of the proximal left ICA. 3. Sites  of moderate atherosclerotic narrowing within the dominant V1 right vertebral artery. CTA head: 1. No intracranial large vessel occlusion. 2. Intracranial atherosclerotic disease with multifocal stenoses, most notably as follows. 3. Severe stenosis of the paraclinoid right ICA. 4. Moderate stenosis of the cavernous internal carotid arteries bilaterally. 5. Severe focal stenosis at the origin of the A1 right anterior cerebral artery. 6. Moderate atherosclerotic narrowing of the V4 right vertebral artery. Of note, the non dominant left vertebral artery terminates as the left PICA. 7. Sites of severe stenosis within the P2 right PCA. Electronically Signed   By: Kellie Simmering DO   On: 11/28/2019 09:45   CT HEAD WO CONTRAST  Result Date: 11/26/2019 CLINICAL DATA:  Left-sided facial droop with slurred speech EXAM: CT HEAD WITHOUT CONTRAST TECHNIQUE: Contiguous axial images were obtained from the base of the skull through the vertex without intravenous contrast. COMPARISON:  None. FINDINGS: Brain: There is no mass, hemorrhage or extra-axial collection. The size and configuration of the ventricles and extra-axial CSF spaces are normal.  There is hypoattenuation of the white matter, most commonly indicating chronic small vessel disease. Old right MCA territory infarct is unchanged. There are old small vessel infarcts of the left basal ganglia and right thalamus. Vascular: No abnormal hyperdensity of the major intracranial arteries or dural venous sinuses. No intracranial atherosclerosis. Skull: The visualized skull base, calvarium and extracranial soft tissues are normal. Sinuses/Orbits: No fluid levels or advanced mucosal thickening of the visualized paranasal sinuses. No mastoid or middle ear effusion. The orbits are normal. IMPRESSION: 1. No acute intracranial abnormality. 2. Old right MCA territory infarct and chronic small vessel disease. Electronically Signed   By: Ulyses Jarred M.D.   On: 11/26/2019 00:56   CT  ANGIO NECK W OR WO CONTRAST  Result Date: 11/28/2019 CLINICAL DATA:  Stroke/TIA, assess intracranial arteries. EXAM: CT ANGIOGRAPHY HEAD AND NECK TECHNIQUE: Multidetector CT imaging of the head and neck was performed using the standard protocol during bolus administration of intravenous contrast. Multiplanar CT image reconstructions and MIPs were obtained to evaluate the vascular anatomy. Carotid stenosis measurements (when applicable) are obtained utilizing NASCET criteria, using the distal internal carotid diameter as the denominator. CONTRAST:  54m OMNIPAQUE IOHEXOL 350 MG/ML SOLN COMPARISON:  Brain MRI, MRA head and MRA neck 11/26/2019, noncontrast head CT 11/26/2019 FINDINGS: CT HEAD FINDINGS Brain: Stable, moderate generalized parenchymal atrophy. Evolving acute/early subacute ischemic infarct superimposed upon a chronic infarct within the right frontal operculum, not simply changed in extent as compared to the brain MRI of 11/26/2019. No evidence of hemorrhagic conversion. No significant mass effect. An additional small acute/early subacute infarct within the right parietal lobe was better appreciated on the prior MRI of 11/26/2019. Redemonstrated background chronic cortically based infarcts within the right frontal, right parietal, bilateral occipital and right temporal lobes. Redemonstrated chronic lacunar infarcts in the basal ganglia and right thalamus. A tiny chronic lacunar infarct within the inferior left cerebellum was better appreciated on the prior MRI. Stable background advanced chronic small vessel ischemic disease within the cerebral white matter. No extra-axial fluid collection. No evidence of intracranial mass. No midline shift. Vascular: Reported below. Skull: Normal. Negative for fracture or focal lesion. Sinuses: No significant paranasal sinus disease or mastoid effusion at the imaged levels. Orbits: No acute abnormality. Review of the MIP images confirms the above findings CTA NECK  FINDINGS Aortic arch: Standard aortic branching. Atherosclerotic plaque within the visualized aortic arch and proximal major branch vessels of the neck. No hemodynamically significant innominate or proximal subclavian artery stenosis. Right carotid system: CCA and ICA patent within the neck. Prior right carotid endarterectomy. Redemonstrated, approximately 3 cm distal to the bifurcation, there is irregularity of the proximal ICA with luminal narrowing of 70-80% as compared to the vessel more distally within the neck (series 7, image 185). 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 Redemonstrated mild/moderate narrowing of the distal cervical ICA. Left carotid system: CCA and ICA patent within the neck. Mild calcified plaque at the CCA origin. There is more prominent mixed plaque within the carotid bifurcation and proximal ICA. Stenosis of the proximal ICA of 60-70% (series 7, image 193). Vertebral arteries: The vertebral arteries are patent within the neck. The right vertebral artery is dominant. Sites of moderate stenosis within the V1 right vertebral artery. Nonstenotic plaque at the origin of the left vertebral artery. Skeleton: No acute bony abnormality or aggressive osseous lesion. Cervical spondylosis with multilevel disc space narrowing, posterior disc osteophytes, uncovertebral and facet hypertrophy. Fusion across the C4-C5, C5-C6 and C6-C7  disc spaces. Other neck: Nonspecific cystic lesion within the subcutaneous left upper neck soft tissues measuring 11 mm (series 7, image 25). No cervical lymphadenopathy. Upper chest: No consolidation within the imaged lung apices. Prior median sternotomy. Review of the MIP images confirms the above findings CTA HEAD FINDINGS Anterior circulation: The intracranial internal carotid arteries are patent. Mixed plaque within both vessels. On the right, there are sites of moderate stenosis within the cavernous segment and severe stenosis of the paraclinoid  segment. On the left, there is moderate stenosis of the cavernous segment. The M1 middle cerebral arteries are patent without significant stenosis. Mild stenosis within the M1 right MCA. No M2 proximal branch occlusion or high-grade proximal stenosis is identified. The anterior cerebral arteries are patent. Severe focal stenosis at the origin of the A1 right ACA (series 10, image 17). No intracranial aneurysm is identified. Posterior circulation: The non dominant intracranial left vertebral artery is developmentally diminutive, but patent and terminates as the left PICA. Moderate atherosclerotic narrowing of the V4 right vertebral artery proximal to the right PICA origin. The basilar artery is patent. The posterior cerebral arteries are patent bilaterally. Redemonstrated multifocal atherosclerotic irregularity of the right posterior cerebral artery. Most notably, there are sites of severe stenosis within the P2 right PCA (series 10, image 21). Posterior communicating arteries are hypoplastic or absent bilaterally. Venous sinuses: Within limitations of contrast timing, no convincing thrombus. Anatomic variants: As described Review of the MIP images confirms the above findings IMPRESSION: CT head: 1. Evolving acute/early subacute ischemic infarct within the right frontal operculum superimposed upon a chronic right frontal lobe infarct. This infarct has not significantly changed in extent as compared to the prior brain MRI of 11/26/2019. No evidence of hemorrhagic conversion. No significant mass effect. 2. An additional small acute/early subacute infarct within the right parietal lobe was better appreciated on the prior MRI. 3. Stable background generalized parenchymal atrophy and chronic ischemic changes as outlined. CTA neck: 1. Prior right carotid endarterectomy. Approximately 3 cm distal to the carotid bifurcation, there is irregularity of the proximal right ICA with focal stenosis of 70-80% and an adjacent 11 mm  pseudoaneurysm. Additionally, there is mild/moderate stenosis of the distal right cervical ICA. 2. 60-70% stenosis of the proximal left ICA. 3. Sites of moderate atherosclerotic narrowing within the dominant V1 right vertebral artery. CTA head: 1. No intracranial large vessel occlusion. 2. Intracranial atherosclerotic disease with multifocal stenoses, most notably as follows. 3. Severe stenosis of the paraclinoid right ICA. 4. Moderate stenosis of the cavernous internal carotid arteries bilaterally. 5. Severe focal stenosis at the origin of the A1 right anterior cerebral artery. 6. Moderate atherosclerotic narrowing of the V4 right vertebral artery. Of note, the non dominant left vertebral artery terminates as the left PICA. 7. Sites of severe stenosis within the P2 right PCA. Electronically Signed   By: Kellie Simmering DO   On: 11/28/2019 09:45   MR ANGIO HEAD WO CONTRAST  Result Date: 11/26/2019 CLINICAL DATA:  Right MCA stroke. History of right carotid endarterectomy. Right carotid aneurysm. EXAM: MRA NECK WITHOUT AND WITH CONTRAST MRA HEAD WITHOUT CONTRAST TECHNIQUE: Multiplanar and multiecho pulse sequences of the neck were obtained without and with intravenous contrast. Angiographic images of the neck were obtained using MRA technique without and with intravenous contast.; Angiographic images of the Circle of Willis were obtained using MRA technique without intravenous contrast. CONTRAST:  30m GADAVIST GADOBUTROL 1 MMOL/ML IV SOLN COMPARISON:  MRI head 11/26/2019 FINDINGS: MRA NECK FINDINGS Aortic arch  normal. Proximal great vessels widely patent. There is a moderate to severe stenosis of the origin of the right subclavian artery with diffuse disease distal to this. Left subclavian artery widely patent. Right carotid bifurcation widely patent. By history prior endarterectomy. Approximately 3 cm distal to the bifurcation, there is an area of moderate to severe focal stenosis in the internal carotid artery with  adjacent 11 mm aneurysm. This may be a pseudoaneurysm from prior endarterectomy. In addition, there is mild to moderate stenosis of the cervical internal carotid artery below the skull base presumably atherosclerotic. Left carotid bifurcation widely patent. Approximately 50% diameter stenosis proximal left internal carotid artery above the bifurcation. Mild atherosclerotic disease proximal right vertebral artery without significant stenosis. Mild stenosis distal right vertebral artery. Right vertebral artery patent to the basilar Small left vertebral artery which appears to end in PICA. MRA HEAD FINDINGS Right vertebral artery is patent to the basilar. Right PICA patent. Left vertebral artery is small and ends in PICA. Mild atherosclerotic irregularity of the distal basilar without significant stenosis. Diffuse disease in the right posterior cerebral artery with moderate to severe stenosis proximally and in the P2 segment. Fetal origin of the left posterior cerebral artery which appears widely patent. Atherosclerotic disease in the cavernous carotid bilaterally. Moderate stenosis on the right and mild to moderate stenosis on the left. Moderate stenosis proximal right A1 segment. Right M1 segment widely patent. Mild stenosis in the superior division of the right middle cerebral artery at the origin. Left anterior cerebral artery patent with mild to moderate atherosclerotic disease. Moderate stenosis in the superior division of the left middle cerebral artery. Negative for cerebral aneurysm. IMPRESSION: 1. By history, right carotid endarterectomy. Right carotid bifurcation patent. 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 Mild to moderate stenosis of the distal cervical internal carotid artery 2. 50% diameter stenosis proximal left internal carotid artery above the bifurcation  3. Right vertebral artery supplies the basilar. Small left vertebral artery ends in PICA 4. Moderate intracranial atherosclerotic disease without large vessel occlusion. 5. Moderate to severe stenosis proximal right subclavian artery. Electronically Signed   By: Franchot Gallo M.D.   On: 11/26/2019 15:26   MR ANGIO NECK W WO CONTRAST  Result Date: 11/26/2019 CLINICAL DATA:  Right MCA stroke. History of right carotid endarterectomy. Right carotid aneurysm. EXAM: MRA NECK WITHOUT AND WITH CONTRAST MRA HEAD WITHOUT CONTRAST TECHNIQUE: Multiplanar and multiecho pulse sequences of the neck were obtained without and with intravenous contrast. Angiographic images of the neck were obtained using MRA technique without and with intravenous contast.; Angiographic images of the Circle of Willis were obtained using MRA technique without intravenous contrast. CONTRAST:  10m GADAVIST GADOBUTROL 1 MMOL/ML IV SOLN COMPARISON:  MRI head 11/26/2019 FINDINGS: MRA NECK FINDINGS Aortic arch normal. Proximal great vessels widely patent. There is a moderate to severe stenosis of the origin of the right subclavian artery with diffuse disease distal to this. Left subclavian artery widely patent. Right carotid bifurcation widely patent. By history prior endarterectomy. Approximately 3 cm distal to the bifurcation, there is an area of moderate to severe focal stenosis in the internal carotid artery with adjacent 11 mm aneurysm. This may be a pseudoaneurysm from prior endarterectomy. In addition, there is mild to moderate stenosis of the cervical internal carotid artery below the skull base presumably atherosclerotic. Left carotid bifurcation widely patent. Approximately 50% diameter  stenosis proximal left internal carotid artery above the bifurcation. Mild atherosclerotic disease proximal right vertebral artery without significant stenosis. Mild stenosis distal right vertebral artery. Right vertebral artery patent to the basilar Small left  vertebral artery which appears to end in PICA. MRA HEAD FINDINGS Right vertebral artery is patent to the basilar. Right PICA patent. Left vertebral artery is small and ends in PICA. Mild atherosclerotic irregularity of the distal basilar without significant stenosis. Diffuse disease in the right posterior cerebral artery with moderate to severe stenosis proximally and in the P2 segment. Fetal origin of the left posterior cerebral artery which appears widely patent. Atherosclerotic disease in the cavernous carotid bilaterally. Moderate stenosis on the right and mild to moderate stenosis on the left. Moderate stenosis proximal right A1 segment. Right M1 segment widely patent. Mild stenosis in the superior division of the right middle cerebral artery at the origin. Left anterior cerebral artery patent with mild to moderate atherosclerotic disease. Moderate stenosis in the superior division of the left middle cerebral artery. Negative for cerebral aneurysm. IMPRESSION: 1. By history, right carotid endarterectomy. Right carotid bifurcation patent. 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 Mild to moderate stenosis of the distal cervical internal carotid artery 2. 50% diameter stenosis proximal left internal carotid artery above the bifurcation 3. Right vertebral artery supplies the basilar. Small left vertebral artery ends in PICA 4. Moderate intracranial atherosclerotic disease without large vessel occlusion. 5. Moderate to severe stenosis proximal right subclavian artery. Electronically Signed   By: Franchot Gallo M.D.   On: 11/26/2019 15:26   MR BRAIN WO CONTRAST  Result Date: 11/26/2019 CLINICAL DATA:  Neuro deficit. EXAM: MRI HEAD WITHOUT CONTRAST TECHNIQUE: Multiplanar, multiecho pulse sequences of the brain and surrounding structures were obtained without intravenous  contrast. COMPARISON:  11/26/2019 head CT.  01/16/2019 MRI head. FINDINGS: Please note image quality is mildly degraded by motion artifact. Brain: Focal restricted diffusion involving the right frontal and parietal regions with correlative T2/FLAIR hyperintense signal. Sequela of remote right MCA territory, bilateral occipital and right temporal insults. Remote lacunar infarcts involving the right corona radiata, bilateral basal ganglia, midbrain and right thalamus. Tiny remote left cerebellar infarct. Moderate diffuse parenchymal volume loss with ex vacuo dilatation. Remote tiny right thalamic microhemorrhage. No midline shift, mass lesion or extra-axial fluid collection. Scattered and confluent background white matter T2/FLAIR hyperintense foci are nonspecific however commonly associated with chronic microvascular ischemic changes. Vascular: Grossly normal flow voids. Tortuous basilar artery and right V4 segment abutting the right anterolateral medulla. Skull and upper cervical spine: Normal marrow signal. Upper cervical spondylosis. Sinuses/Orbits: Sequela of bilateral lens replacement. Mild ethmoid sinus mucosal thickening. No mastoid effusion. Other: None. IMPRESSION: Acute infarcts involving the right frontal and parietal lobes. Remote infarcts involving the right MCA territory, bilateral occipital, right temporal region and left cerebellum. Remote lacunar infarcts involving the right corona radiata, bilateral basal ganglia, midbrain and right thalamus. Moderate cerebral atrophy. Advanced chronic microvascular ischemic changes. These results were called by telephone at the time of interpretation on 11/26/2019 at 10:21 am to provider [facility] , who verbally acknowledged these results. Electronically Signed   By: Primitivo Gauze M.D.   On: 11/26/2019 10:25   IR PATIENT EVAL TECH 0-60 MINS  Result Date: 11/27/2019 Rosann Auerbach, RT     11/27/2019  9:19 AM Case could not be attempted due kidney  function  and conflicting referrals with vascular surgery.  ECHOCARDIOGRAM COMPLETE  Result Date: 11/27/2019    ECHOCARDIOGRAM REPORT   Patient Name:   PETRA SARGEANT Date of Exam: 11/27/2019 Medical Rec #:  [id number]      Height:       66.0 in Accession #:    [id number]     Weight:       123.0 lb Date of Birth:  10-Feb-1941      BSA:          1.627 m Patient Age:    50 years       BP:           173/86 mmHg Patient Gender: M              HR:           79 bpm. Exam Location:  Inpatient Procedure: 2D Echo Indications:    Stroke 434.91 / I163.9  History:        Patient has prior history of Echocardiogram examinations, most                 recent 06/27/2019. CAD; Risk Factors:Hypertension, Diabetes and                 Dyslipidemia. Carotid artery disease. Chronic kidney disease.  Sonographer:    Darlina Sicilian RDCS Referring Phys: [id number] [place]  1. Left ventricular ejection fraction, by estimation, is 50 to 55%. The left ventricle has low normal function. The left ventricle has no regional wall motion abnormalities. Left ventricular diastolic parameters are consistent with Grade I diastolic dysfunction (impaired relaxation).  2. Right ventricular systolic function is normal. The right ventricular size is normal.  3. The mitral valve is normal in structure. Mild mitral valve regurgitation. No evidence of mitral stenosis.  4. The aortic valve is tricuspid. Aortic valve regurgitation is not visualized. Mild to moderate aortic valve sclerosis/calcification is present, without any evidence of aortic stenosis.  5. The inferior vena cava is normal in size with greater than 50% respiratory variability, suggesting right atrial pressure of 3 mmHg. Conclusion(s)/Recommendation(s): No intracardiac source of embolism detected on this transthoracic study. A transesophageal echocardiogram is recommended to exclude cardiac source of embolism if clinically indicated. FINDINGS  Left Ventricle: Left ventricular ejection  fraction, by estimation, is 50 to 55%. The left ventricle has low normal function. The left ventricle has no regional wall motion abnormalities. The left ventricular internal cavity size was normal in size. There is no left ventricular hypertrophy. Left ventricular diastolic parameters are consistent with Grade I diastolic dysfunction (impaired relaxation). Right Ventricle: The right ventricular size is normal. No increase in right ventricular wall thickness. Right ventricular systolic function is normal. Left Atrium: Left atrial size was normal in size. Right Atrium: Right atrial size was normal in size. Pericardium: There is no evidence of pericardial effusion. Mitral Valve: The mitral valve is normal in structure. Normal mobility of the mitral valve leaflets. Mild mitral valve regurgitation. No evidence of mitral valve stenosis. Tricuspid Valve: The tricuspid valve is normal in structure. Tricuspid valve regurgitation is not demonstrated. No evidence of tricuspid stenosis. Aortic Valve: The aortic valve is tricuspid. Aortic valve regurgitation is not visualized. Mild to moderate aortic valve sclerosis/calcification is present, without any evidence of aortic stenosis. Pulmonic Valve: The pulmonic valve was normal in structure. Pulmonic valve regurgitation is not visualized. No evidence of pulmonic stenosis. Aorta: The aortic root is normal in size and structure. Venous: The inferior vena  cava is normal in size with greater than 50% respiratory variability, suggesting right atrial pressure of 3 mmHg. IAS/Shunts: No atrial level shunt detected by color flow Doppler.  LEFT VENTRICLE PLAX 2D LVIDd:         4.00 cm  Diastology LV IVS:        1.60 cm  LV e' lateral:   6.83 cm/s LVOT diam:     2.10 cm  LV E/e' lateral: 10.1 LV SV:         56       LV e' medial:    4.12 cm/s LV SV Index:   35       LV E/e' medial:  16.7 LVOT Area:     3.46 cm  RIGHT VENTRICLE TAPSE (M-mode): 1.0 cm LEFT ATRIUM             Index        RIGHT ATRIUM           Index LA Vol (A2C):   43.0 ml 26.44 ml/m RA Area:     11.60 cm LA Vol (A4C):   49.5 ml 30.43 ml/m RA Volume:   22.20 ml  13.65 ml/m LA Biplane Vol: 47.6 ml 29.27 ml/m  AORTIC VALVE LVOT Vmax:   77.70 cm/s LVOT Vmean:  56.900 cm/s LVOT VTI:    0.163 m  AORTA Ao Root diam: 3.70 cm MITRAL VALVE MV Area (PHT): 4.06 cm     SHUNTS MV Decel Time: 187 msec     Systemic VTI:  0.16 m MV E velocity: 69.00 cm/s   Systemic Diam: 2.10 cm MV A velocity: 112.00 cm/s MV E/A ratio:  0.62 Candee Furbish MD Electronically signed by Candee Furbish MD Signature Date/Time: 11/27/2019/12:39:33 PM    Final    VAS US CAROTID  Result Date: 11/27/2019 Carotid Arterial Duplex Study Indications:       CVA. Risk Factors:      Hypertension, hyperlipidemia, Diabetes, prior CVA. Comparison Study:  Prior study 07-09-2019 RT ICA 40-59%, LT ICA 60-79% Performing Technologist: Darlin Coco  Examination Guidelines: A complete evaluation includes B-mode imaging, spectral Doppler, color Doppler, and power Doppler as needed of all accessible portions of each vessel. Bilateral testing is considered an integral part of a complete examination. Limited examinations for reoccurring indications may be performed as noted.  Right Carotid Findings: +----------+--------+--------+--------+-------------------+--------------------+           PSV cm/sEDV cm/sStenosisPlaque Description Comments             +----------+--------+--------+--------+-------------------+--------------------+ CCA Prox  82      8               heterogenous                            +----------+--------+--------+--------+-------------------+--------------------+ CCA Distal88      8               heterogenous                            +----------+--------+--------+--------+-------------------+--------------------+ ICA Prox  223     26      40-59%  heterogenous and   Velocities may                                         hypoechoic  underestimate degree                                                      of stenosis due to                                                        more proximal                                                             obstruction.         +----------+--------+--------+--------+-------------------+--------------------+ ICA Mid   146     18                                                      +----------+--------+--------+--------+-------------------+--------------------+ ICA Distal66      11                                                      +----------+--------+--------+--------+-------------------+--------------------+ ECA       127                                                             +----------+--------+--------+--------+-------------------+--------------------+ +----------+--------+-------+----------------+-------------------+           PSV cm/sEDV cmsDescribe        Arm Pressure (mmHG) +----------+--------+-------+----------------+-------------------+ [id number]            Multiphasic, WNL                    +----------+--------+-------+----------------+-------------------+ +---------+--------+---+--------+--+---------+ VertebralPSV cm/s136EDV cm/s13Antegrade +---------+--------+---+--------+--+---------+  Left Carotid Findings: +----------+--------+--------+--------+-------------------+--------------------+           PSV cm/sEDV cm/sStenosisPlaque Description Comments             +----------+--------+--------+--------+-------------------+--------------------+ CCA Prox  100     16              heterogenous                            +----------+--------+--------+--------+-------------------+--------------------+ CCA Distal96      12                                                      +----------+--------+--------+--------+-------------------+--------------------+ ICA Prox  257     60  60-79%  calcific  and       Velocities may                                         diffuse            underestimate degree                                                      of stenosis due to                                                        more proximal                                                             obstruction.         +----------+--------+--------+--------+-------------------+--------------------+ ICA Mid   234     39                                                      +----------+--------+--------+--------+-------------------+--------------------+ ICA Distal77      22                                                      +----------+--------+--------+--------+-------------------+--------------------+ ECA       111                                                             +----------+--------+--------+--------+-------------------+--------------------+ +----------+--------+--------+----------------+-------------------+           PSV cm/sEDV cm/sDescribe        Arm Pressure (mmHG) +----------+--------+--------+----------------+-------------------+ Subclavian122     97      Multiphasic, WNL                    +----------+--------+--------+----------------+-------------------+ +---------+--------+--+--------+-+---------+ VertebralPSV cm/s52EDV cm/s8Antegrade +---------+--------+--+--------+-+---------+   Summary: Right Carotid: Velocities in the right ICA are consistent with a 40-59%                stenosis. Non-hemodynamically significant plaque <50% noted in                the CCA. Left Carotid: Velocities in the left ICA are consistent with a 60-79% stenosis.               Non-hemodynamically significant plaque <50% noted in the CCA. Vertebrals:  Bilateral vertebral arteries demonstrate antegrade flow. Subclavians: Normal flow hemodynamics were seen in bilateral subclavian              arteries. *See table(s) above for measurements and  observations.  Electronically signed by Deitra Mayo MD on 11/27/2019 at 2:49:34 PM.    Final        Subjective: Patient seen and examined at the bedside this morning.  Hemodynamically stable for discharge today.  Discharge Exam: Vitals:   11/29/19 0338 11/29/19 0822  BP: (!) 105/56 (!) 120/59  Pulse: 79 91  Resp: 18 (!) 24  Temp: 98.3 F (36.8 C) 98.3 F (36.8 C)  SpO2: 99% 99%   Vitals:   11/28/19 1958 11/28/19 2313 11/29/19 0338 11/29/19 0822  BP: (!) 131/76 (!) 113/59 (!) 105/56 (!) 120/59  Pulse: 89 95 79 91  Resp: 19  18 (!) 24  Temp: 98.4 F (36.9 C) 99 F (37.2 C) 98.3 F (36.8 C) 98.3 F (36.8 C)  TempSrc: Oral Oral Oral Oral  SpO2: 96% 98% 99% 99%    General: Pt is alert, awake, not in acute distress, mild dysarthria Cardiovascular: RRR, S1/S2 +, no rubs, no gallops Respiratory: CTA bilaterally, no wheezing, no rhonchi Abdominal: Soft, NT, ND, bowel sounds + Extremities: no edema, no cyanosis    The results of significant diagnostics from this hospitalization (including imaging, microbiology, ancillary and laboratory) are listed below for reference.     Microbiology: Recent Results (from the past 240 hour(s))  SARS Coronavirus 2 by RT PCR (hospital order, performed in [facility] hospital lab) Nasopharyngeal Nasopharyngeal Swab     Status: None   Collection Time: 11/26/19 12:32 PM   Specimen: Nasopharyngeal Swab  Result Value Ref Range Status   SARS Coronavirus 2 NEGATIVE NEGATIVE Final    Comment: (NOTE) SARS-CoV-2 target nucleic acids are NOT DETECTED.  The SARS-CoV-2 RNA is generally detectable in upper and lower respiratory specimens during the acute phase of infection. The lowest concentration of SARS-CoV-2 viral copies this assay can detect is 250 copies / mL. A negative result does not preclude SARS-CoV-2 infection and should not be used as the sole basis for treatment or other patient management decisions.  A negative result may  occur with improper specimen collection / handling, submission of specimen other than nasopharyngeal swab, presence of viral mutation(s) within the areas targeted by this assay, and inadequate number of viral copies (<250 copies / mL). A negative result must be combined with clinical observations, patient history, and epidemiological information.  Fact Sheet for Patients:   StrictlyIdeas.no  Fact Sheet for Healthcare Providers: BankingDealers.co.za  This test is not yet approved or  cleared by the [place] FDA and has been authorized for detection and/or diagnosis of SARS-CoV-2 by FDA under an Emergency Use Authorization (EUA).  This EUA will remain in effect (meaning this test can be used) for the duration of the COVID-19 declaration under Section 564(b)(1) of the Act, 21 U.S.C. section 360bbb-3(b)(1), unless the authorization is terminated or revoked sooner.  Performed at [facility], [place] [address], [place], [place] [address]   Urine culture     Status: Abnormal   Collection Time: 11/26/19  1:00 PM   Specimen: Urine, Random  Result Value Ref Range Status   Specimen Description URINE, RANDOM  Final   Special Requests   Final    Normal Performed at [facility], [place] [address] [place], [place] [address]    Culture >=100,000 COLONIES/mL PROTEUS  MIRABILIS (A)  Final   Report Status 11/28/2019 FINAL  Final   Organism ID, Bacteria PROTEUS MIRABILIS (A)  Final      Susceptibility   Proteus mirabilis - MIC*    AMPICILLIN >=32 RESISTANT Resistant     CEFAZOLIN >=64 RESISTANT Resistant     CIPROFLOXACIN <=0.25 SENSITIVE Sensitive     GENTAMICIN <=1 SENSITIVE Sensitive     NITROFURANTOIN 128 RESISTANT Resistant     TRIMETH/SULFA <=20 SENSITIVE Sensitive     AMPICILLIN/SULBACTAM <=2 SENSITIVE Sensitive     PIP/TAZO <=4 SENSITIVE Sensitive     * >=100,000 COLONIES/mL PROTEUS MIRABILIS     Labs: BNP (last 3  results) No results for input(s): BNP in the last 8760 hours. Basic Metabolic Panel: Recent Labs  Lab 11/26/19 0004 11/26/19 0034 11/27/19 0313 11/28/19 0327 11/29/19 0242  NA 138 142 141 143 140  K 4.3 4.4 4.2 4.0 4.1  CL 107 108 113* 117* 113*  CO2 21*  --  22 21* 19*  GLUCOSE 117* 113* 130* 129* 103*  BUN 39* 39* 34* 19 24*  CREATININE 1.79* 1.80* 1.50* 1.13 1.58*  CALCIUM 9.3  --  8.5* 8.2* 8.1*  MG  --   --   --   --  1.9   Liver Function Tests: Recent Labs  Lab 11/26/19 0004  AST 20  ALT 17  ALKPHOS 91  BILITOT 1.0  PROT 7.5  ALBUMIN 3.8   No results for input(s): LIPASE, AMYLASE in the last 168 hours. No results for input(s): AMMONIA in the last 168 hours. CBC: Recent Labs  Lab 11/26/19 0004 11/26/19 0034 11/27/19 0313  WBC 13.9*  --  11.6*  NEUTROABS 9.3*  --   --   HGB 12.5* 13.3 11.1*  HCT 38.5* 39.0 34.1*  MCV 93.0  --  92.9  PLT 315  --  276   Cardiac Enzymes: No results for input(s): CKTOTAL, CKMB, CKMBINDEX, TROPONINI in the last 168 hours. BNP: Invalid input(s): POCBNP CBG: Recent Labs  Lab 11/28/19 1335 11/28/19 1414 11/28/19 1606 11/28/19 2123 11/29/19 0617  GLUCAP 141* 142* 98 309* 100*   D-Dimer No results for input(s): DDIMER in the last 72 hours. Hgb A1c Recent Labs    11/27/19 0313  HGBA1C 6.3*   Lipid Profile Recent Labs    11/27/19 0313  CHOL 131  HDL 40*  LDLCALC 76  TRIG 75  CHOLHDL 3.3   Thyroid function studies No results for input(s): TSH, T4TOTAL, T3FREE, THYROIDAB in the last 72 hours.  Invalid input(s): FREET3 Anemia work up No results for input(s): VITAMINB12, FOLATE, FERRITIN, TIBC, IRON, RETICCTPCT in the last 72 hours. Urinalysis    Component Value Date/Time   COLORURINE YELLOW 11/26/2019 0915   APPEARANCEUR CLEAR 11/26/2019 0915   LABSPEC 1.019 11/26/2019 0915   PHURINE 5.0 11/26/2019 0915   GLUCOSEU NEGATIVE 11/26/2019 0915   HGBUR NEGATIVE 11/26/2019 0915   BILIRUBINUR NEGATIVE  11/26/2019 0915   KETONESUR NEGATIVE 11/26/2019 0915   PROTEINUR 100 (A) 11/26/2019 0915   NITRITE NEGATIVE 11/26/2019 0915   LEUKOCYTESUR NEGATIVE 11/26/2019 0915   Sepsis Labs Invalid input(s): PROCALCITONIN,  WBC,  LACTICIDVEN Microbiology Recent Results (from the past 240 hour(s))  SARS Coronavirus 2 by RT PCR (hospital order, performed in [facility] hospital lab) Nasopharyngeal Nasopharyngeal Swab     Status: None   Collection Time: 11/26/19 12:32 PM   Specimen: Nasopharyngeal Swab  Result Value Ref Range Status   SARS Coronavirus 2 NEGATIVE NEGATIVE Final  Comment: (NOTE) SARS-CoV-2 target nucleic acids are NOT DETECTED.  The SARS-CoV-2 RNA is generally detectable in upper and lower respiratory specimens during the acute phase of infection. The lowest concentration of SARS-CoV-2 viral copies this assay can detect is 250 copies / mL. A negative result does not preclude SARS-CoV-2 infection and should not be used as the sole basis for treatment or other patient management decisions.  A negative result may occur with improper specimen collection / handling, submission of specimen other than nasopharyngeal swab, presence of viral mutation(s) within the areas targeted by this assay, and inadequate number of viral copies (<250 copies / mL). A negative result must be combined with clinical observations, patient history, and epidemiological information.  Fact Sheet for Patients:   StrictlyIdeas.no  Fact Sheet for Healthcare Providers: BankingDealers.co.za  This test is not yet approved or  cleared by the [place] FDA and has been authorized for detection and/or diagnosis of SARS-CoV-2 by FDA under an Emergency Use Authorization (EUA).  This EUA will remain in effect (meaning this test can be used) for the duration of the COVID-19 declaration under Section 564(b)(1) of the Act, 21 U.S.C. section 360bbb-3(b)(1), unless the  authorization is terminated or revoked sooner.  Performed at [facility], [place] [address], [place], [place] [address]   Urine culture     Status: Abnormal   Collection Time: 11/26/19  1:00 PM   Specimen: Urine, Random  Result Value Ref Range Status   Specimen Description URINE, RANDOM  Final   Special Requests   Final    Normal Performed at [facility], [place] [address], [place], [place] [address]    Culture >=100,000 COLONIES/mL PROTEUS MIRABILIS (A)  Final   Report Status 11/28/2019 FINAL  Final   Organism ID, Bacteria PROTEUS MIRABILIS (A)  Final      Susceptibility   Proteus mirabilis - MIC*    AMPICILLIN >=32 RESISTANT Resistant     CEFAZOLIN >=64 RESISTANT Resistant     CIPROFLOXACIN <=0.25 SENSITIVE Sensitive     GENTAMICIN <=1 SENSITIVE Sensitive     NITROFURANTOIN 128 RESISTANT Resistant     TRIMETH/SULFA <=20 SENSITIVE Sensitive     AMPICILLIN/SULBACTAM <=2 SENSITIVE Sensitive     PIP/TAZO <=4 SENSITIVE Sensitive     * >=100,000 COLONIES/mL PROTEUS MIRABILIS    Please note: You were cared for by a hospitalist during your hospital stay. Once you are discharged, your primary care physician will handle any further medical issues. Please note that NO REFILLS for any discharge medications will be authorized once you are discharged, as it is imperative that you return to your primary care physician (or establish a relationship with a primary care physician if you do not have one) for your post hospital discharge needs so that they can reassess your need for medications and monitor your lab values.    Time coordinating discharge: 40 minutes  SIGNED:   Shelly Coss, MD  [facility] 11/29/2019, 11:19 AM Pager [phone]  If 7PM-7AM, please contact night-coverage [email or web address] Password [email or web address]

## 2019-11-29 NOTE — Progress Notes (Signed)
   11/29/19 0200  Provider Notification  Provider Name/Title Dr Cyd Silence  Date Provider Notified 11/29/19  Time Provider Notified 0200  Notification Type Page  Notification Reason Other (Comment) (Pt had 3 beats of Vtach with intermittent missed beats)  Response See new orders (labs ordered)  Date of Provider Response 11/29/19  Time of Provider Response [phone]

## 2019-11-29 NOTE — Progress Notes (Signed)
Physical Therapy Treatment Patient Details Name: Kelly Williams MRN: [id number] DOB: [birth year] Today's Date: 11/29/2019    History of Present Illness Pt is a 79 y/o male with PMH of stroke, HTN, DM, periphreal neuropathy, carotid disease with L carotid endarterectomy, CABG x 5, R shoulder arthroplasthy, medication noncompliance and alcohol abuse. Daughter found patient after falling and intoxicated, L facial droop, slurring speech and aggression (reports hx of mulitple falls). MRI reveals acute ischemic infarcts of R frontal and parietal lobes. MRA of neck revealed focal stenosis in the right internal carotid artery approximately 3 cm distal to the bifurcation with an adjacent 11 mm aneurysmal dilatation.      PT Comments    Pt is progressing well towards goals, however continues to demonstrate decreased safety awareness and judgement. Pt requires frequency VC for safety and min guard for OOB activities secondary to decreased balance. He will need 24/7 supervision to d/c home. This skilled session focused on gait training and stair negotiation. Patient would benefit from continued skilled PT to maximize safety with mobility and activity tolerance. Will continue to follow acutely.    Follow Up Recommendations  Home health PT;Supervision for mobility/OOB;Supervision/Assistance - 24 hour     Equipment Recommendations  None recommended by PT    Recommendations for Other Services       Precautions / Restrictions Precautions Precautions: Fall Precaution Comments: CIWA  Restrictions Weight Bearing Restrictions: No    Mobility  Bed Mobility Overal bed mobility: Needs Assistance Bed Mobility: Supine to Sit     Supine to sit: Supervision;HOB elevated     General bed mobility comments: Supervision for safety and line management  Transfers Overall transfer level: Needs assistance Equipment used: Rolling walker (2 wheeled) Transfers: Sit to/from Stand Sit to Stand: Min guard          General transfer comment: min guard for safety and frequent cues for hand placement with RW  Ambulation/Gait Ambulation/Gait assistance: Min guard Gait Distance (Feet): 150 Feet Assistive device: Rolling walker (2 wheeled) Gait Pattern/deviations: Step-through pattern;Decreased stride length;Narrow base of support;Drifts right/left Gait velocity: decr   General Gait Details: Min guard for safety. Occasional drifting when not looking forward. Cues for RW management and proximity.    Stairs Stairs: Yes Stairs assistance: Min assist Stair Management: Two rails;Alternating pattern;Step to pattern;Forwards Number of Stairs: 16 General stair comments: Pt ascends stairs reciprocally and descends with a step two pattern. After negotiation 8 steps pt requested seated break secondary to SOB, afterwards he was able to negotiate an additional 8 steps. Min A for balance and stability. 1x LOB due to poor foot placement. Pt able to correct with min A.   Wheelchair Mobility    Modified Rankin (Stroke Patients Only) Modified Rankin (Stroke Patients Only) Pre-Morbid Rankin Score: Moderate disability Modified Rankin: Moderately severe disability     Balance Overall balance assessment: Needs assistance Sitting-balance support: No upper extremity supported;Feet supported Sitting balance-Leahy Scale: Good Sitting balance - Comments: min guard for safety   Standing balance support: Bilateral upper extremity supported;During functional activity;No upper extremity supported Standing balance-Leahy Scale: Fair Standing balance comment: Pt ambulating with RW but able to wash hands at sink without any LOB               High Level Balance Comments: Repeated horizontal and vertical head turns with emphasis on maintaining straight trajectory            Cognition Arousal/Alertness: Awake/alert Behavior During Therapy: [facility] for tasks assessed/performed;Impulsive Overall Cognitive  Status: No  family/caregiver present to determine baseline cognitive functioning Area of Impairment: Attention;Following commands;Safety/judgement;Awareness;Problem solving                   Current Attention Level: Sustained   Following Commands: Follows one step commands consistently;Follows one step commands with increased time;Follows multi-step commands consistently Safety/Judgement: Decreased awareness of safety Awareness: Emergent Problem Solving: Slow processing;Decreased initiation;Difficulty sequencing;Requires verbal cues General Comments: Slow processing commands and frequent cues required for safety.       Exercises      General Comments General comments (skin integrity, edema, etc.): pt able to recall room number      Pertinent Vitals/Pain Pain Assessment: No/denies pain    Home Living                      Prior Function            PT Goals (current goals can now be found in the care plan section) Acute Rehab PT Goals Patient Stated Goal: to get back home  PT Goal Formulation: With patient Time For Goal Achievement: 12/11/19 Potential to Achieve Goals: Good Progress towards PT goals: Progressing toward goals    Frequency    Min 4X/week      PT Plan Current plan remains appropriate    Co-evaluation              AM-PAC PT "6 Clicks" Mobility   Outcome Measure  Help needed turning from your back to your side while in a flat bed without using bedrails?: None Help needed moving from lying on your back to sitting on the side of a flat bed without using bedrails?: None Help needed moving to and from a bed to a chair (including a wheelchair)?: A Little Help needed standing up from a chair using your arms (e.g., wheelchair or bedside chair)?: A Little Help needed to walk in hospital room?: A Little Help needed climbing 3-5 steps with a railing? : A Lot 6 Click Score: 19    End of Session Equipment Utilized During Treatment: Gait  belt Activity Tolerance: Patient tolerated treatment well Patient left: with call bell/phone within reach;in chair;with chair alarm set (instructed pt's daughter to ask RN to set bed alarm before she leaves) Nurse Communication: Mobility status PT Visit Diagnosis: Muscle weakness (generalized) (M62.81);History of falling (Z91.81)     Time: 4536-4680 PT Time Calculation (min) (ACUTE ONLY): 30 min  Charges:  $Gait Training: 23-37 mins                    Benjiman Core, [place] Pager [phone] Acute Rehab   Allena Katz 11/29/2019, 1:19 PM

## 2019-11-29 NOTE — Progress Notes (Signed)
STROKE TEAM PROGRESS NOTE   INTERVAL HISTORY Pt sitting in chair, talking on the phone.  No complaints, neuro stable.  Dr. Oneida Alar from vascular surgery saw patient and plan for right CEA redo next Tuesday.  Vitals:   11/28/19 2313 11/29/19 0338 11/29/19 0822 11/29/19 1145  BP: (!) 113/59 (!) 105/56 (!) 120/59 (!) 111/62  Pulse: 95 79 91 82  Resp:  18 (!) 24 20  Temp: 99 F (37.2 C) 98.3 F (36.8 C) 98.3 F (36.8 C) 98.6 F (37 C)  TempSrc: Oral Oral Oral Oral  SpO2: 98% 99% 99% 96%   CBC:  Recent Labs  Lab 11/26/19 0004 11/26/19 0004 11/26/19 0034 11/27/19 0313  WBC 13.9*  --   --  11.6*  NEUTROABS 9.3*  --   --   --   HGB 12.5*   < > 13.3 11.1*  HCT 38.5*   < > 39.0 34.1*  MCV 93.0  --   --  92.9  PLT 315  --   --  276   < > = values in this interval not displayed.   Basic Metabolic Panel:  Recent Labs  Lab 11/28/19 0327 11/29/19 0242  NA 143 140  K 4.0 4.1  CL 117* 113*  CO2 21* 19*  GLUCOSE 129* 103*  BUN 19 24*  CREATININE 1.13 1.58*  CALCIUM 8.2* 8.1*  MG  --  1.9   Lipid Panel:  Recent Labs  Lab 11/27/19 0313  CHOL 131  TRIG 75  HDL 40*  CHOLHDL 3.3  VLDL 15  LDLCALC 76   HgbA1c:  Recent Labs  Lab 11/27/19 0313  HGBA1C 6.3*   Urine Drug Screen:  Recent Labs  Lab 11/26/19 0921  LABOPIA NONE DETECTED  COCAINSCRNUR NONE DETECTED  LABBENZ NONE DETECTED  AMPHETMU NONE DETECTED  THCU NONE DETECTED  LABBARB NONE DETECTED    Alcohol Level  Recent Labs  Lab 11/26/19 0852  ETH <10    IMAGING past 24 hours No results found.  PHYSICAL EXAM    Temp:  [98.1 F (36.7 C)-99 F (37.2 C)] 98.6 F (37 C) (07/29 1145) Pulse Rate:  [79-95] 82 (07/29 1145) Resp:  [18-24] 20 (07/29 1145) BP: (105-194)/(56-96) 111/62 (07/29 1145) SpO2:  [96 %-100 %] 96 % (07/29 1145)  General - Well nourished, well developed, in no apparent distress.  Ophthalmologic - fundi not visualized due to noncooperation.  Cardiovascular - Regular rhythm and  rate.  Mental Status -  Level of arousal and orientation to time, place, and person were intact. Language including expression, naming, repetition, comprehension was assessed and found intact. Mild dysarthria Fund of Knowledge was assessed and was intact.  Cranial Nerves II - XII - II - Visual field intact OU. III, IV, VI - Extraocular movements intact. V - Facial sensation intact bilaterally. VII - mild left facial droop. VIII - Hearing & vestibular intact bilaterally. X - Palate elevates symmetrically. XI - Chin turning & shoulder shrug intact bilaterally. XII - Tongue protrusion intact.  Motor Strength - The patient's strength was normal in all extremities and pronator drift was absent except chronic right shoulder rotator cuff injury with limited ROM.  Bulk was normal and fasciculations were absent.   Motor Tone - Muscle tone was assessed at the neck and appendages and was normal.  Reflexes - The patient's reflexes were symmetrical in all extremities and he had no pathological reflexes.  Sensory - Light touch, temperature/pinprick were assessed and were symmetrical.    Coordination -  The patient had normal movements in the hands with no ataxia or dysmetria. Tremor was absent.  Gait and Station - deferred.   ASSESSMENT/PLAN [name] is a 79 y.o. male with history of stroke, hypertension, hyperlipidemia, diabetes, carotid disease s/p R CEA, medication noncompliance and alcohol abuse presenting with facial droop and dysarthria following intoxication.   Stroke:   R MCA small infarcts, likely due to R ICA stenosis s/p CEA, workup underway  CT head No acute abnormality. Old R MCA infarct. Small vessel disease. ASPECTS 10.     MRI  R frontal and parietal lobe infarcts. Old R MCA territory, B occipital, R temporal lobe and L cerebellar infarcts. Old R corona radiata, B basal ganglia, midbrain and R thalamic lacunes.   MRA head & neck  R CEA w/ bifurcation patent. Focal  stenosis 3cm distal to bifurcation w/ adjacent 25mm aneurysm. 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 Distal cervical R ICA w/ mild to moderate stenosis. L ICA proximal to bifurcation 50%. Moderate intracranial atherosclerosis. R subclavian w/ moderate to severe stenosis.   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  Mild to moderate stenosis of distal right cervical ICA.  60 to 70% proximal ICA stenosis.  Severe stenosis right ICA paraclinoid area, moderate stenosis cavernous ICA bilaterally.  Right A1 and P2 severe focal stenosis.  Right V4 moderate stenosis.  2D Echo EF 50-55%. No source of embolus   LDL 76  HgbA1c 6.3  VTE prophylaxis - Lovenox 30 mg sq daily   aspirin 81 mg daily prior to admission, now on aspirin 325 mg daily and clopidogrel 75 mg daily.  Therapy recommendations:  OP SLP, HH PT, HH OT  Disposition:  Plan home w/ HH - daughter works 10 hours a day  Follow up stroke clinic in 4 weeks. Office will call with appt date and time  Carotid disease, bilateral   Hx R CEA 10 years ago and L CEA 7 years ago  MRA neck  R CEA w/ bifurcation patent. Focal stenosis 3cm distal to bifurcation w/ adjacent 41mm aneurysm. 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 Distal cervical R ICA w/ mild to moderate stenosis. L ICA proximal to bifurcation 50%.   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  Mild to moderate stenosis of distal right cervical ICA.  60 to 70% proximal ICA stenosis.  Severe stenosis right ICA paraclinoid area, moderate stenosis cavernous ICA bilaterally.   VVS onboard (Dr. Oneida Alar)- plan for right CEA re-do next Tuesday  Continue follow up with [facility] for left ICA stenosis  Hypertension   Stable 150-170s . Permissive hypertension (OK if < 220/120) but gradually lower in 5-7  days . Long-term BP goal 130-150  Hyperlipidemia  Home meds:  lipitor 40  Now on lipitor 80  LDL 76, goal < 70  Continue statin at discharge  Diabetes type II, insulin dependent, Controlled  HgbA1c 6.3, goal < 7.0  CBGs   SSI  Close PCP follow up  Chronic ETOH abuse  Intoxication just prior to admission   alcohol level <10  CIWA protocol  advised to drink no more than 2 drink(s) a day  Other Stroke Risk Factors  Advanced age  Former Cigarette smoker, quit 17 yrs ago  Hx stroke/TIA  Coronary  artery disease s/p CABG x 5  Chronic systolic cardiomyopathy   Other Active Problems  COPD  AKI on CKD stage III. Cre 1.79-1.8-1.5->1.13->1.58 - encourage po intake  Peripheral neuropathy on Neurontin   Milk leukocytosis, WBC 13.9-11.6, thought to be reactive  GERD on PPI  Hospital day # 2  Neurology will sign off. Please call with questions. Pt will follow up with stroke clinic NP at [facility] in about 4 weeks. Thanks for the consult.   Rosalin Hawking, MD PhD Stroke Neurology 11/29/2019 3:18 PM  To contact Stroke Continuity provider, please refer to [email or web address] After hours, contact General Neurology

## 2019-11-29 NOTE — Progress Notes (Signed)
Pt.'s IV removed; site is clean, dry and intact. Discharge instructions reviewed with pt. and daughter; voiced understanding. Daughter has already picked up medications from [facility]. Personal belongs were packed by pt's daughter and transported with pt. Pt's daughter did not take walker as she stated he already has two walkers at home. Pt. transported via wheelchair by staff to personal vehicle.

## 2019-11-29 NOTE — TOC Transition Note (Signed)
Transition of Care [facility]) - CM/SW Discharge Note   Patient Details  Name: Kelly Williams MRN: [id number] Date of Birth: 05/02/1941  [facility] CM/SW Contact:  Kelly Chars, Kelly Williams Phone Number: 11/29/2019, 1:46 PM   Clinical Narrative:   CSW spoke with pt regarding plan with [facility], informed him that [facility] will make contact on Sunday.  Pt walker had not yet been delivered.  Pt stated daughter is concerned about him being home alone and asked CSW to contact daughter.  CSW spoke with pt daughter, Kelly Williams, who did state she was worried about pt being home alone.  Discussed that insurance will not cover an aid being in the home, would need to private pay.  She also asked about possible SNF placement and CSW informed her that this would not be authorized and would also require private pay.  Daughter to pursue possible day program, was given name of Kelly Williams and daughter planning to contact them to see if this is an option.  Daughter is planning to pick up after work today.     Final next level of care: [place] Barriers to Discharge: Barriers Resolved   Patient Goals and CMS Choice Patient states their goals for this hospitalization and ongoing recovery are:: improve enough to drive again CMS Medicare.gov Compare Post Acute Care list provided to:: Patient Choice offered to / list presented to : Patient  Discharge Placement                  Name of family member notified: Kelly Williams, daughter Patient and family notified of of transfer: 11/29/19  Discharge Plan and Services     Post Acute Care Choice: Home Health, Durable Medical Equipment          DME Arranged: Kelly Williams rolling DME Agency: [employer] Date DME Agency Contacted: 11/28/19   Representative spoke with at DME Agency: [place]: PT, OT [place] Agency: [place] ([place]) Date [place]: 11/29/19   Representative spoke with at [place]: [place] ([place]) Interventions     Readmission Risk Interventions No flowsheet data found.

## 2019-11-29 NOTE — Progress Notes (Addendum)
Progress Note    11/29/2019 7:40 AM Hospital Day 3  Subjective:  No complaints; says his right great toe is numb and burns.  Says his speech is unchanged.   Afebrile HR 70's-90's NSR 676'P-950'D systolic 32% RA  Vitals:   11/28/19 2313 11/29/19 0338  BP: (!) 113/59 (!) 105/56  Pulse: 95 79  Resp:  18  Temp: 99 F (37.2 C) 98.3 F (36.8 C)  SpO2: 98% 99%    Physical Exam: Cardiac:  regular Lungs:  Non labored Neuro:  Mild slurred speech and facial droop.  Equal hand grips bilaterally Extremities:  Easily palpable right DP pulse.  Right foot is warm and well perfused.  CBC    Component Value Date/Time   WBC 11.6 (H) 11/27/2019 0313   RBC 3.67 (L) 11/27/2019 0313   HGB 11.1 (L) 11/27/2019 0313   HCT 34.1 (L) 11/27/2019 0313   PLT 276 11/27/2019 0313   MCV 92.9 11/27/2019 0313   MCH 30.2 11/27/2019 0313   MCHC 32.6 11/27/2019 0313   RDW 13.2 11/27/2019 0313   LYMPHSABS 3.4 11/26/2019 0004   MONOABS 0.9 11/26/2019 0004   EOSABS 0.3 11/26/2019 0004   BASOSABS 0.1 11/26/2019 0004    BMET    Component Value Date/Time   NA 140 11/29/2019 0242   K 4.1 11/29/2019 0242   CL 113 (H) 11/29/2019 0242   CO2 19 (L) 11/29/2019 0242   GLUCOSE 103 (H) 11/29/2019 0242   BUN 24 (H) 11/29/2019 0242   CREATININE 1.58 (H) 11/29/2019 0242   CREATININE 1.34 (H) 08/24/2019 1114   CALCIUM 8.1 (L) 11/29/2019 0242   GFRNONAA 41 (L) 11/29/2019 0242   GFRNONAA 50 (L) 08/24/2019 1114   GFRAA 48 (L) 11/29/2019 0242   GFRAA 58 (L) 08/24/2019 1114    INR    Component Value Date/Time   INR 1.0 11/26/2019 0004     Intake/Output Summary (Last 24 hours) at 11/29/2019 0740 Last data filed at 11/28/2019 2000 Gross per 24 hour  Intake 120 ml  Output 325 ml  Net -205 ml   CTA head/neck 11/28/2019: IMPRESSION: CT head:  1. Evolving acute/early subacute ischemic infarct within the right frontal operculum superimposed upon a chronic right frontal lobe infarct. This infarct has  not significantly changed in extent as compared to the prior brain MRI of 11/26/2019. No evidence of hemorrhagic conversion. No significant mass effect. 2. An additional small acute/early subacute infarct within the right parietal lobe was better appreciated on the prior MRI. 3. Stable background generalized parenchymal atrophy and chronic ischemic changes as outlined.  CTA neck:  1. Prior right carotid endarterectomy. 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 Additionally, there is mild/moderate stenosis of the distal right cervical ICA. 2. 60-70% stenosis of the proximal left ICA. 3. Sites of moderate atherosclerotic narrowing within the dominant V1 right vertebral artery.  CTA head:  1. No intracranial large vessel occlusion. 2. Intracranial atherosclerotic disease with multifocal stenoses, most notably as follows. 3. Severe stenosis of the paraclinoid right ICA. 4. Moderate stenosis of the cavernous internal carotid arteries bilaterally. 5. Severe focal stenosis at the origin of the A1 right anterior cerebral artery. 6. Moderate atherosclerotic narrowing of the V4 right vertebral artery. Of note, the non dominant left vertebral artery terminates as the left PICA. 7. Sites of severe stenosis within the P2 right PCA.    Assessment/Plan:  79 y.o. male with recent CVA and hx of right CEA Hospital Day 3  -pt continues with some facial droop and slurred speech.  Pt states this has not worsened.  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 Additionally, there is mild/moderate stenosis of the distal right cervical ICA. -Dr. Oneida Alar to determine intervention--redo CEA vs TCAR/stenting.  Pt with elevated creatinine of 1.58 today.   -pt has moderate stenosis of the left ICA-will need to continue to monitor  this  Leontine Locket, PA-C [facility] [phone] 11/29/2019 7:40 AM   CTA reviewed, not a candidate for TCAR stent due to short common carotid.  Will proceed with redo right CEA on Tuesday August 3.  Ok to d/c from my standpoint.  Ok to continue Plavix/ASA.  Will call pt daughter later today with update  Ruta Hinds, MD [facility] [place] Office: [phone]

## 2019-12-01 ENCOUNTER — Other Ambulatory Visit (HOSPITAL_COMMUNITY)
Admit: 2019-12-01 | Discharge: 2019-12-01 | Disposition: A | Discharge status: Home / Self Care | Payer: Medicare HMO | Attending: Vascular Surgery | Admitting: Vascular Surgery

## 2019-12-01 DIAGNOSIS — Z01812 Encounter for preprocedural laboratory examination: Secondary | ICD-10-CM | POA: Diagnosis present

## 2019-12-01 DIAGNOSIS — Z20822 Contact with and (suspected) exposure to covid-19: Secondary | ICD-10-CM | POA: Diagnosis not present

## 2019-12-01 LAB — SARS CORONAVIRUS 2 (TAT 6-24 HRS): SARS Coronavirus 2: NEGATIVE

## 2019-12-02 DIAGNOSIS — E1142 Type 2 diabetes mellitus with diabetic polyneuropathy: Secondary | ICD-10-CM | POA: Diagnosis not present

## 2019-12-02 DIAGNOSIS — I2510 Atherosclerotic heart disease of native coronary artery without angina pectoris: Secondary | ICD-10-CM | POA: Diagnosis not present

## 2019-12-02 DIAGNOSIS — N1831 Chronic kidney disease, stage 3a: Secondary | ICD-10-CM | POA: Diagnosis not present

## 2019-12-02 DIAGNOSIS — D631 Anemia in chronic kidney disease: Secondary | ICD-10-CM | POA: Diagnosis not present

## 2019-12-02 DIAGNOSIS — I69322 Dysarthria following cerebral infarction: Secondary | ICD-10-CM | POA: Diagnosis not present

## 2019-12-02 DIAGNOSIS — J449 Chronic obstructive pulmonary disease, unspecified: Secondary | ICD-10-CM | POA: Diagnosis not present

## 2019-12-02 DIAGNOSIS — I429 Cardiomyopathy, unspecified: Secondary | ICD-10-CM | POA: Diagnosis not present

## 2019-12-02 DIAGNOSIS — E1122 Type 2 diabetes mellitus with diabetic chronic kidney disease: Secondary | ICD-10-CM | POA: Diagnosis not present

## 2019-12-02 DIAGNOSIS — I129 Hypertensive chronic kidney disease with stage 1 through stage 4 chronic kidney disease, or unspecified chronic kidney disease: Secondary | ICD-10-CM | POA: Diagnosis not present

## 2019-12-03 ENCOUNTER — Other Ambulatory Visit: Payer: Self-pay | Admitting: Family Medicine

## 2019-12-03 ENCOUNTER — Other Ambulatory Visit: Payer: Self-pay

## 2019-12-03 DIAGNOSIS — R471 Dysarthria and anarthria: Secondary | ICD-10-CM

## 2019-12-03 DIAGNOSIS — R269 Unspecified abnormalities of gait and mobility: Secondary | ICD-10-CM

## 2019-12-03 DIAGNOSIS — I63231 Cerebral infarction due to unspecified occlusion or stenosis of right carotid arteries: Secondary | ICD-10-CM

## 2019-12-03 NOTE — Progress Notes (Signed)
Unable to reach pt's daughter, [name] for pre-op call. Left detailed pre-op instructions on her voicemail.   Covid test done on 12/01/19 and it's negative.

## 2019-12-03 NOTE — Patient Outreach (Signed)
[facility]) Care Management  12/03/2019  Kelly Williams 1940/10/27 [id number]   EMMI- Stroke not on APL RED ON [place] Day # 1 Date: 12/02/19 Red Alert Reason:  Scheduled follow up? No  Outreach attempt: Spoke with daughter [name]. She states that patient does not have follow up right now as patient is scheduled for surgery on tomorrow and will go from there.  She denies any further needs at this time.     Plan: RN CM will close case.   Jone Baseman, RN, MSN [facility] Care Management Care Management Coordinator Direct Line [phone] Toll Free: [phone]  Fax: [phone]

## 2019-12-04 ENCOUNTER — Encounter (HOSPITAL_COMMUNITY): Admission: RE | Disposition: E | Payer: Self-pay | Source: Home / Self Care | Attending: Vascular Surgery

## 2019-12-04 ENCOUNTER — Inpatient Hospital Stay (HOSPITAL_COMMUNITY): Payer: Medicare HMO

## 2019-12-04 ENCOUNTER — Inpatient Hospital Stay (HOSPITAL_COMMUNITY)
Admission: RE | Admit: 2019-12-04 | Discharge: 2020-02-01 | DRG: 003 | Disposition: E | Payer: Medicare HMO | Attending: Vascular Surgery | Admitting: Vascular Surgery

## 2019-12-04 ENCOUNTER — Encounter (HOSPITAL_COMMUNITY): Payer: Self-pay | Admitting: Vascular Surgery

## 2019-12-04 ENCOUNTER — Inpatient Hospital Stay (HOSPITAL_COMMUNITY): Payer: Medicare HMO | Admitting: Certified Registered Nurse Anesthetist

## 2019-12-04 ENCOUNTER — Inpatient Hospital Stay (HOSPITAL_COMMUNITY): Payer: Medicare HMO | Admitting: Certified Registered"

## 2019-12-04 ENCOUNTER — Other Ambulatory Visit: Payer: Self-pay

## 2019-12-04 DIAGNOSIS — J9690 Respiratory failure, unspecified, unspecified whether with hypoxia or hypercapnia: Secondary | ICD-10-CM | POA: Diagnosis not present

## 2019-12-04 DIAGNOSIS — E43 Unspecified severe protein-calorie malnutrition: Secondary | ICD-10-CM | POA: Insufficient documentation

## 2019-12-04 DIAGNOSIS — F1010 Alcohol abuse, uncomplicated: Secondary | ICD-10-CM | POA: Diagnosis present

## 2019-12-04 DIAGNOSIS — E871 Hypo-osmolality and hyponatremia: Secondary | ICD-10-CM | POA: Diagnosis not present

## 2019-12-04 DIAGNOSIS — T17908A Unspecified foreign body in respiratory tract, part unspecified causing other injury, initial encounter: Secondary | ICD-10-CM | POA: Diagnosis not present

## 2019-12-04 DIAGNOSIS — J90 Pleural effusion, not elsewhere classified: Secondary | ICD-10-CM | POA: Diagnosis not present

## 2019-12-04 DIAGNOSIS — K219 Gastro-esophageal reflux disease without esophagitis: Secondary | ICD-10-CM | POA: Diagnosis present

## 2019-12-04 DIAGNOSIS — Z7189 Other specified counseling: Secondary | ICD-10-CM

## 2019-12-04 DIAGNOSIS — R29810 Facial weakness: Secondary | ICD-10-CM | POA: Diagnosis present

## 2019-12-04 DIAGNOSIS — I6521 Occlusion and stenosis of right carotid artery: Principal | ICD-10-CM | POA: Diagnosis present

## 2019-12-04 DIAGNOSIS — I4819 Other persistent atrial fibrillation: Secondary | ICD-10-CM | POA: Diagnosis not present

## 2019-12-04 DIAGNOSIS — N189 Chronic kidney disease, unspecified: Secondary | ICD-10-CM

## 2019-12-04 DIAGNOSIS — Z8673 Personal history of transient ischemic attack (TIA), and cerebral infarction without residual deficits: Secondary | ICD-10-CM

## 2019-12-04 DIAGNOSIS — Z7982 Long term (current) use of aspirin: Secondary | ICD-10-CM

## 2019-12-04 DIAGNOSIS — E8779 Other fluid overload: Secondary | ICD-10-CM | POA: Diagnosis not present

## 2019-12-04 DIAGNOSIS — R0902 Hypoxemia: Secondary | ICD-10-CM | POA: Diagnosis not present

## 2019-12-04 DIAGNOSIS — J449 Chronic obstructive pulmonary disease, unspecified: Secondary | ICD-10-CM

## 2019-12-04 DIAGNOSIS — I97618 Postprocedural hemorrhage and hematoma of a circulatory system organ or structure following other circulatory system procedure: Secondary | ICD-10-CM | POA: Diagnosis not present

## 2019-12-04 DIAGNOSIS — K567 Ileus, unspecified: Secondary | ICD-10-CM

## 2019-12-04 DIAGNOSIS — N170 Acute kidney failure with tubular necrosis: Secondary | ICD-10-CM | POA: Diagnosis not present

## 2019-12-04 DIAGNOSIS — J984 Other disorders of lung: Secondary | ICD-10-CM | POA: Diagnosis not present

## 2019-12-04 DIAGNOSIS — I482 Chronic atrial fibrillation, unspecified: Secondary | ICD-10-CM | POA: Diagnosis not present

## 2019-12-04 DIAGNOSIS — Z4682 Encounter for fitting and adjustment of non-vascular catheter: Secondary | ICD-10-CM | POA: Diagnosis not present

## 2019-12-04 DIAGNOSIS — I130 Hypertensive heart and chronic kidney disease with heart failure and stage 1 through stage 4 chronic kidney disease, or unspecified chronic kidney disease: Secondary | ICD-10-CM | POA: Diagnosis present

## 2019-12-04 DIAGNOSIS — M1990 Unspecified osteoarthritis, unspecified site: Secondary | ICD-10-CM | POA: Diagnosis present

## 2019-12-04 DIAGNOSIS — J690 Pneumonitis due to inhalation of food and vomit: Secondary | ICD-10-CM | POA: Diagnosis not present

## 2019-12-04 DIAGNOSIS — J9601 Acute respiratory failure with hypoxia: Secondary | ICD-10-CM

## 2019-12-04 DIAGNOSIS — E861 Hypovolemia: Secondary | ICD-10-CM | POA: Diagnosis not present

## 2019-12-04 DIAGNOSIS — E785 Hyperlipidemia, unspecified: Secondary | ICD-10-CM | POA: Diagnosis present

## 2019-12-04 DIAGNOSIS — Z681 Body mass index (BMI) 19 or less, adult: Secondary | ICD-10-CM

## 2019-12-04 DIAGNOSIS — I509 Heart failure, unspecified: Secondary | ICD-10-CM | POA: Diagnosis not present

## 2019-12-04 DIAGNOSIS — G9341 Metabolic encephalopathy: Secondary | ICD-10-CM | POA: Diagnosis not present

## 2019-12-04 DIAGNOSIS — E876 Hypokalemia: Secondary | ICD-10-CM | POA: Diagnosis not present

## 2019-12-04 DIAGNOSIS — I6389 Other cerebral infarction: Secondary | ICD-10-CM | POA: Diagnosis not present

## 2019-12-04 DIAGNOSIS — J811 Chronic pulmonary edema: Secondary | ICD-10-CM | POA: Diagnosis not present

## 2019-12-04 DIAGNOSIS — I6621 Occlusion and stenosis of right posterior cerebral artery: Secondary | ICD-10-CM | POA: Diagnosis not present

## 2019-12-04 DIAGNOSIS — I6503 Occlusion and stenosis of bilateral vertebral arteries: Secondary | ICD-10-CM | POA: Diagnosis not present

## 2019-12-04 DIAGNOSIS — R0603 Acute respiratory distress: Secondary | ICD-10-CM

## 2019-12-04 DIAGNOSIS — I517 Cardiomegaly: Secondary | ICD-10-CM | POA: Diagnosis not present

## 2019-12-04 DIAGNOSIS — E8770 Fluid overload, unspecified: Secondary | ICD-10-CM | POA: Diagnosis not present

## 2019-12-04 DIAGNOSIS — I6523 Occlusion and stenosis of bilateral carotid arteries: Secondary | ICD-10-CM | POA: Diagnosis not present

## 2019-12-04 DIAGNOSIS — I672 Cerebral atherosclerosis: Secondary | ICD-10-CM | POA: Diagnosis not present

## 2019-12-04 DIAGNOSIS — Z951 Presence of aortocoronary bypass graft: Secondary | ICD-10-CM

## 2019-12-04 DIAGNOSIS — J95821 Acute postprocedural respiratory failure: Secondary | ICD-10-CM | POA: Diagnosis not present

## 2019-12-04 DIAGNOSIS — S049XXA Injury of unspecified cranial nerve, initial encounter: Secondary | ICD-10-CM | POA: Diagnosis not present

## 2019-12-04 DIAGNOSIS — I2510 Atherosclerotic heart disease of native coronary artery without angina pectoris: Secondary | ICD-10-CM | POA: Diagnosis present

## 2019-12-04 DIAGNOSIS — I5030 Unspecified diastolic (congestive) heart failure: Secondary | ICD-10-CM | POA: Diagnosis present

## 2019-12-04 DIAGNOSIS — R569 Unspecified convulsions: Secondary | ICD-10-CM | POA: Diagnosis not present

## 2019-12-04 DIAGNOSIS — Z96611 Presence of right artificial shoulder joint: Secondary | ICD-10-CM | POA: Diagnosis present

## 2019-12-04 DIAGNOSIS — N179 Acute kidney failure, unspecified: Secondary | ICD-10-CM | POA: Diagnosis not present

## 2019-12-04 DIAGNOSIS — Z66 Do not resuscitate: Secondary | ICD-10-CM | POA: Diagnosis not present

## 2019-12-04 DIAGNOSIS — Z87891 Personal history of nicotine dependence: Secondary | ICD-10-CM

## 2019-12-04 DIAGNOSIS — Z978 Presence of other specified devices: Secondary | ICD-10-CM | POA: Diagnosis not present

## 2019-12-04 DIAGNOSIS — R188 Other ascites: Secondary | ICD-10-CM | POA: Diagnosis not present

## 2019-12-04 DIAGNOSIS — I340 Nonrheumatic mitral (valve) insufficiency: Secondary | ICD-10-CM | POA: Diagnosis not present

## 2019-12-04 DIAGNOSIS — E875 Hyperkalemia: Secondary | ICD-10-CM | POA: Diagnosis not present

## 2019-12-04 DIAGNOSIS — G8929 Other chronic pain: Secondary | ICD-10-CM | POA: Diagnosis present

## 2019-12-04 DIAGNOSIS — D62 Acute posthemorrhagic anemia: Secondary | ICD-10-CM | POA: Diagnosis not present

## 2019-12-04 DIAGNOSIS — R4781 Slurred speech: Secondary | ICD-10-CM | POA: Diagnosis present

## 2019-12-04 DIAGNOSIS — Z7902 Long term (current) use of antithrombotics/antiplatelets: Secondary | ICD-10-CM

## 2019-12-04 DIAGNOSIS — E874 Mixed disorder of acid-base balance: Secondary | ICD-10-CM | POA: Diagnosis not present

## 2019-12-04 DIAGNOSIS — I959 Hypotension, unspecified: Secondary | ICD-10-CM | POA: Diagnosis not present

## 2019-12-04 DIAGNOSIS — G9340 Encephalopathy, unspecified: Secondary | ICD-10-CM | POA: Diagnosis not present

## 2019-12-04 DIAGNOSIS — R471 Dysarthria and anarthria: Secondary | ICD-10-CM | POA: Diagnosis present

## 2019-12-04 DIAGNOSIS — K625 Hemorrhage of anus and rectum: Secondary | ICD-10-CM | POA: Diagnosis not present

## 2019-12-04 DIAGNOSIS — I639 Cerebral infarction, unspecified: Secondary | ICD-10-CM

## 2019-12-04 DIAGNOSIS — R945 Abnormal results of liver function studies: Secondary | ICD-10-CM

## 2019-12-04 DIAGNOSIS — Z0189 Encounter for other specified special examinations: Secondary | ICD-10-CM

## 2019-12-04 DIAGNOSIS — R0600 Dyspnea, unspecified: Secondary | ICD-10-CM

## 2019-12-04 DIAGNOSIS — E870 Hyperosmolality and hypernatremia: Secondary | ICD-10-CM | POA: Diagnosis not present

## 2019-12-04 DIAGNOSIS — Z79899 Other long term (current) drug therapy: Secondary | ICD-10-CM

## 2019-12-04 DIAGNOSIS — R1011 Right upper quadrant pain: Secondary | ICD-10-CM

## 2019-12-04 DIAGNOSIS — Z515 Encounter for palliative care: Secondary | ICD-10-CM

## 2019-12-04 DIAGNOSIS — N1832 Chronic kidney disease, stage 3b: Secondary | ICD-10-CM | POA: Diagnosis present

## 2019-12-04 DIAGNOSIS — E1143 Type 2 diabetes mellitus with diabetic autonomic (poly)neuropathy: Secondary | ICD-10-CM | POA: Diagnosis present

## 2019-12-04 DIAGNOSIS — Z888 Allergy status to other drugs, medicaments and biological substances status: Secondary | ICD-10-CM

## 2019-12-04 DIAGNOSIS — E11319 Type 2 diabetes mellitus with unspecified diabetic retinopathy without macular edema: Secondary | ICD-10-CM | POA: Diagnosis present

## 2019-12-04 DIAGNOSIS — R7989 Other specified abnormal findings of blood chemistry: Secondary | ICD-10-CM

## 2019-12-04 DIAGNOSIS — E878 Other disorders of electrolyte and fluid balance, not elsewhere classified: Secondary | ICD-10-CM | POA: Diagnosis not present

## 2019-12-04 DIAGNOSIS — Z9911 Dependence on respirator [ventilator] status: Secondary | ICD-10-CM | POA: Diagnosis not present

## 2019-12-04 DIAGNOSIS — G459 Transient cerebral ischemic attack, unspecified: Secondary | ICD-10-CM

## 2019-12-04 DIAGNOSIS — G9389 Other specified disorders of brain: Secondary | ICD-10-CM | POA: Diagnosis not present

## 2019-12-04 DIAGNOSIS — J440 Chronic obstructive pulmonary disease with acute lower respiratory infection: Secondary | ICD-10-CM | POA: Diagnosis not present

## 2019-12-04 DIAGNOSIS — E860 Dehydration: Secondary | ICD-10-CM | POA: Diagnosis not present

## 2019-12-04 DIAGNOSIS — R918 Other nonspecific abnormal finding of lung field: Secondary | ICD-10-CM | POA: Diagnosis not present

## 2019-12-04 DIAGNOSIS — R54 Age-related physical debility: Secondary | ICD-10-CM | POA: Diagnosis not present

## 2019-12-04 DIAGNOSIS — E1165 Type 2 diabetes mellitus with hyperglycemia: Secondary | ICD-10-CM | POA: Diagnosis not present

## 2019-12-04 DIAGNOSIS — I6529 Occlusion and stenosis of unspecified carotid artery: Secondary | ICD-10-CM | POA: Diagnosis present

## 2019-12-04 DIAGNOSIS — J4489 Other specified chronic obstructive pulmonary disease: Secondary | ICD-10-CM

## 2019-12-04 DIAGNOSIS — J9811 Atelectasis: Secondary | ICD-10-CM | POA: Diagnosis not present

## 2019-12-04 DIAGNOSIS — I739 Peripheral vascular disease, unspecified: Secondary | ICD-10-CM | POA: Diagnosis not present

## 2019-12-04 DIAGNOSIS — R1310 Dysphagia, unspecified: Secondary | ICD-10-CM | POA: Diagnosis not present

## 2019-12-04 DIAGNOSIS — R9082 White matter disease, unspecified: Secondary | ICD-10-CM | POA: Diagnosis not present

## 2019-12-04 DIAGNOSIS — D649 Anemia, unspecified: Secondary | ICD-10-CM | POA: Diagnosis not present

## 2019-12-04 DIAGNOSIS — Z8249 Family history of ischemic heart disease and other diseases of the circulatory system: Secondary | ICD-10-CM

## 2019-12-04 DIAGNOSIS — R4182 Altered mental status, unspecified: Secondary | ICD-10-CM | POA: Diagnosis not present

## 2019-12-04 DIAGNOSIS — I10 Essential (primary) hypertension: Secondary | ICD-10-CM | POA: Diagnosis not present

## 2019-12-04 DIAGNOSIS — F319 Bipolar disorder, unspecified: Secondary | ICD-10-CM | POA: Diagnosis present

## 2019-12-04 DIAGNOSIS — D6489 Other specified anemias: Secondary | ICD-10-CM | POA: Diagnosis not present

## 2019-12-04 DIAGNOSIS — R109 Unspecified abdominal pain: Secondary | ICD-10-CM

## 2019-12-04 DIAGNOSIS — E1122 Type 2 diabetes mellitus with diabetic chronic kidney disease: Secondary | ICD-10-CM | POA: Diagnosis present

## 2019-12-04 DIAGNOSIS — Z452 Encounter for adjustment and management of vascular access device: Secondary | ICD-10-CM

## 2019-12-04 DIAGNOSIS — Z9289 Personal history of other medical treatment: Secondary | ICD-10-CM

## 2019-12-04 DIAGNOSIS — I63511 Cerebral infarction due to unspecified occlusion or stenosis of right middle cerebral artery: Secondary | ICD-10-CM | POA: Diagnosis not present

## 2019-12-04 DIAGNOSIS — I129 Hypertensive chronic kidney disease with stage 1 through stage 4 chronic kidney disease, or unspecified chronic kidney disease: Secondary | ICD-10-CM | POA: Diagnosis not present

## 2019-12-04 DIAGNOSIS — J8489 Other specified interstitial pulmonary diseases: Secondary | ICD-10-CM | POA: Diagnosis not present

## 2019-12-04 DIAGNOSIS — J9600 Acute respiratory failure, unspecified whether with hypoxia or hypercapnia: Secondary | ICD-10-CM

## 2019-12-04 DIAGNOSIS — N183 Chronic kidney disease, stage 3 unspecified: Secondary | ICD-10-CM | POA: Diagnosis not present

## 2019-12-04 DIAGNOSIS — D638 Anemia in other chronic diseases classified elsewhere: Secondary | ICD-10-CM | POA: Diagnosis not present

## 2019-12-04 HISTORY — PX: PATCH ANGIOPLASTY: SHX6230

## 2019-12-04 HISTORY — PX: ENDARTERECTOMY: SHX5162

## 2019-12-04 HISTORY — PX: THROMBECTOMY BRACHIAL ARTERY: SHX6649

## 2019-12-04 LAB — URINALYSIS, ROUTINE W REFLEX MICROSCOPIC
Bacteria, UA: NONE SEEN
Bilirubin Urine: NEGATIVE
Glucose, UA: NEGATIVE mg/dL
Hgb urine dipstick: NEGATIVE
Ketones, ur: NEGATIVE mg/dL
Leukocytes,Ua: NEGATIVE
Nitrite: NEGATIVE
Protein, ur: 30 mg/dL — AB
Specific Gravity, Urine: 1.012 (ref 1.005–1.030)
pH: 5 (ref 5.0–8.0)

## 2019-12-04 LAB — ABO/RH: ABO/RH(D): A POS

## 2019-12-04 LAB — PROTIME-INR
INR: 1 (ref 0.8–1.2)
Prothrombin Time: 13.1 seconds (ref 11.4–15.2)

## 2019-12-04 LAB — CBC
HCT: 36.1 % — ABNORMAL LOW (ref 39.0–52.0)
Hemoglobin: 11.5 g/dL — ABNORMAL LOW (ref 13.0–17.0)
MCH: 30.3 pg (ref 26.0–34.0)
MCHC: 31.9 g/dL (ref 30.0–36.0)
MCV: 95 fL (ref 80.0–100.0)
Platelets: 354 10*3/uL (ref 150–400)
RBC: 3.8 MIL/uL — ABNORMAL LOW (ref 4.22–5.81)
RDW: 13.5 % (ref 11.5–15.5)
WBC: 10.5 10*3/uL (ref 4.0–10.5)
nRBC: 0 % (ref 0.0–0.2)

## 2019-12-04 LAB — POCT ACTIVATED CLOTTING TIME
Activated Clotting Time: 164 seconds
Activated Clotting Time: 175 seconds
Activated Clotting Time: 191 seconds
Activated Clotting Time: 120 seconds

## 2019-12-04 LAB — COMPREHENSIVE METABOLIC PANEL
ALT: 40 U/L (ref 0–44)
AST: 34 U/L (ref 15–41)
Albumin: 3.6 g/dL (ref 3.5–5.0)
Alkaline Phosphatase: 82 U/L (ref 38–126)
Anion gap: 9 (ref 5–15)
BUN: 24 mg/dL — ABNORMAL HIGH (ref 8–23)
CO2: 21 mmol/L — ABNORMAL LOW (ref 22–32)
Calcium: 9 mg/dL (ref 8.9–10.3)
Chloride: 109 mmol/L (ref 98–111)
Creatinine, Ser: 1.22 mg/dL (ref 0.61–1.24)
GFR calc Af Amer: >60 mL/min (ref >60)
GFR calc non Af Amer: 56 mL/min — ABNORMAL LOW (ref >60)
Glucose, Bld: 116 mg/dL — ABNORMAL HIGH (ref 70–99)
Potassium: 4.4 mmol/L (ref 3.5–5.1)
Sodium: 139 mmol/L (ref 135–145)
Total Bilirubin: 0.7 mg/dL (ref 0.3–1.2)
Total Protein: 7.2 g/dL (ref 6.5–8.1)

## 2019-12-04 LAB — GLUCOSE, CAPILLARY
Glucose-Capillary: 111 mg/dL — ABNORMAL HIGH (ref 70–99)
Glucose-Capillary: 111 mg/dL — ABNORMAL HIGH (ref 70–99)
Glucose-Capillary: 176 mg/dL — ABNORMAL HIGH (ref 70–99)
Glucose-Capillary: 192 mg/dL — ABNORMAL HIGH (ref 70–99)

## 2019-12-04 LAB — APTT: aPTT: 29 seconds (ref 24–36)

## 2019-12-04 LAB — SURGICAL PCR SCREEN
MRSA, PCR: NEGATIVE
Staphylococcus aureus: NEGATIVE

## 2019-12-04 SURGERY — ENDARTERECTOMY, CAROTID
Anesthesia: General | Site: Neck | Laterality: Right

## 2019-12-04 MED ORDER — HYDRALAZINE HCL 20 MG/ML IJ SOLN
5.0000 mg | INTRAMUSCULAR | Status: AC | PRN
  Administered 2019-12-04 – 2019-12-06 (×2): 5 mg via INTRAVENOUS
  Filled 2019-12-04 (×2): qty 1

## 2019-12-04 MED ORDER — PHENYLEPHRINE HCL-NACL 10-0.9 MG/250ML-% IV SOLN
INTRAVENOUS | Status: DC | PRN
  Administered 2019-12-04: 60 ug/min via INTRAVENOUS

## 2019-12-04 MED ORDER — PANTOPRAZOLE SODIUM 40 MG PO TBEC: 40.0000 mg | DELAYED_RELEASE_TABLET | Freq: Every day | ORAL | Status: DC

## 2019-12-04 MED ORDER — LACTATED RINGERS IV SOLN: INTRAVENOUS | Status: DC | PRN

## 2019-12-04 MED ORDER — PROPOFOL 1000 MG/100ML IV EMUL
0.0000 ug/kg/min | INTRAVENOUS | Status: DC
  Administered 2019-12-06 (×2): 10 ug/kg/min via INTRAVENOUS
  Administered 2019-12-07: 15 ug/kg/min via INTRAVENOUS
  Administered 2019-12-08: 10 ug/kg/min via INTRAVENOUS
  Filled 2019-12-04 (×6): qty 100

## 2019-12-04 MED ORDER — FENTANYL CITRATE (PF) 100 MCG/2ML IJ SOLN
INTRAMUSCULAR | Status: DC | PRN
  Administered 2019-12-04 (×3): 50 ug via INTRAVENOUS

## 2019-12-04 MED ORDER — PAPAVERINE HCL 30 MG/ML IJ SOLN
INTRAMUSCULAR | Status: AC
  Filled 2019-12-04: qty 2

## 2019-12-04 MED ORDER — NICARDIPINE HCL IN NACL 20-0.86 MG/200ML-% IV SOLN: 3.0000 mg/h | INTRAVENOUS | Status: DC

## 2019-12-04 MED ORDER — SODIUM CHLORIDE 0.9 % IV SOLN
0.0125 ug/kg/min | INTRAVENOUS | Status: DC
  Filled 2019-12-04: qty 2000

## 2019-12-04 MED ORDER — SODIUM CHLORIDE 0.9 % IV SOLN: INTRAVENOUS | Status: DC | PRN

## 2019-12-04 MED ORDER — PROTAMINE SULFATE 10 MG/ML IV SOLN
INTRAVENOUS | Status: DC | PRN
Start: 2019-12-04 — End: 2019-12-04
  Administered 2019-12-04: 20 mg via INTRAVENOUS
  Administered 2019-12-04: 10 mg via INTRAVENOUS
  Administered 2019-12-04: 30 mg via INTRAVENOUS

## 2019-12-04 MED ORDER — INSULIN ASPART 100 UNIT/ML ~~LOC~~ SOLN
0.0000 [IU] | Freq: Three times a day (TID) | SUBCUTANEOUS | Status: DC
  Administered 2019-12-05 – 2019-12-06 (×2): 3 [IU] via SUBCUTANEOUS

## 2019-12-04 MED ORDER — SUCCINYLCHOLINE CHLORIDE 200 MG/10ML IV SOSY
PREFILLED_SYRINGE | INTRAVENOUS | Status: DC | PRN
Start: 2019-12-04 — End: 2019-12-04
  Administered 2019-12-04: 120 mg via INTRAVENOUS

## 2019-12-04 MED ORDER — PROPOFOL 10 MG/ML IV BOLUS
INTRAVENOUS | Status: AC
  Filled 2019-12-04: qty 20

## 2019-12-04 MED ORDER — CHLORHEXIDINE GLUCONATE CLOTH 2 % EX PADS: 6.0000 | MEDICATED_PAD | Freq: Once | CUTANEOUS | Status: DC

## 2019-12-04 MED ORDER — REMIFENTANIL HCL 2 MG IV SOLR
INTRAVENOUS | Status: DC | PRN
  Administered 2019-12-04 (×2): .07 ug/kg/min via INTRAVENOUS

## 2019-12-04 MED ORDER — ALUM & MAG HYDROXIDE-SIMETH 200-200-20 MG/5ML PO SUSP: 15.0000 mL | ORAL | Status: DC | PRN

## 2019-12-04 MED ORDER — ATORVASTATIN CALCIUM 80 MG PO TABS: 80.0000 mg | ORAL_TABLET | Freq: Every day | ORAL | Status: DC

## 2019-12-04 MED ORDER — POLYETHYLENE GLYCOL 3350 17 G PO PACK
17.0000 g | PACK | Freq: Every day | ORAL | Status: DC
  Administered 2019-12-06: 17 g
  Filled 2019-12-04: qty 1

## 2019-12-04 MED ORDER — ROCURONIUM BROMIDE 10 MG/ML (PF) SYRINGE
PREFILLED_SYRINGE | INTRAVENOUS | Status: AC
  Filled 2019-12-04: qty 10

## 2019-12-04 MED ORDER — SODIUM CHLORIDE 0.9 % IV SOLN: INTRAVENOUS | Status: DC

## 2019-12-04 MED ORDER — CEFAZOLIN SODIUM-DEXTROSE 2-4 GM/100ML-% IV SOLN
2.0000 g | INTRAVENOUS | Status: AC
  Administered 2019-12-04: 2 g via INTRAVENOUS
  Filled 2019-12-04: qty 100

## 2019-12-04 MED ORDER — LIDOCAINE 2% (20 MG/ML) 5 ML SYRINGE
INTRAMUSCULAR | Status: DC | PRN
  Administered 2019-12-04: 40 mg via INTRAVENOUS
  Administered 2019-12-04: 60 mg via INTRAVENOUS

## 2019-12-04 MED ORDER — POTASSIUM CHLORIDE CRYS ER 20 MEQ PO TBCR: 20.0000 meq | EXTENDED_RELEASE_TABLET | Freq: Every day | ORAL | Status: DC | PRN

## 2019-12-04 MED ORDER — FENTANYL CITRATE (PF) 100 MCG/2ML IJ SOLN
INTRAMUSCULAR | Status: DC | PRN
  Administered 2019-12-04 (×2): 75 ug via INTRAVENOUS

## 2019-12-04 MED ORDER — LOSARTAN POTASSIUM 50 MG PO TABS: 25.0000 mg | ORAL_TABLET | Freq: Every day | ORAL | Status: DC

## 2019-12-04 MED ORDER — PHENYLEPHRINE 40 MCG/ML (10ML) SYRINGE FOR IV PUSH (FOR BLOOD PRESSURE SUPPORT)
PREFILLED_SYRINGE | INTRAVENOUS | Status: DC | PRN
  Administered 2019-12-04 (×2): 40 ug via INTRAVENOUS
  Administered 2019-12-04: 80 ug via INTRAVENOUS
  Administered 2019-12-04 (×2): 40 ug via INTRAVENOUS

## 2019-12-04 MED ORDER — CEFAZOLIN SODIUM-DEXTROSE 2-3 GM-%(50ML) IV SOLR
INTRAVENOUS | Status: DC | PRN
Start: 2019-12-04 — End: 2019-12-04
  Administered 2019-12-04: 2 g via INTRAVENOUS

## 2019-12-04 MED ORDER — ALBUTEROL SULFATE HFA 108 (90 BASE) MCG/ACT IN AERS: 1.0000 | INHALATION_SPRAY | RESPIRATORY_TRACT | Status: DC | PRN

## 2019-12-04 MED ORDER — FENTANYL CITRATE (PF) 100 MCG/2ML IJ SOLN
25.0000 ug | INTRAMUSCULAR | Status: DC | PRN
  Administered 2019-12-16 – 2020-01-03 (×2): 25 ug via INTRAVENOUS
  Filled 2019-12-04 (×4): qty 2

## 2019-12-04 MED ORDER — CHLORHEXIDINE GLUCONATE 0.12% ORAL RINSE (MEDLINE KIT)
15.0000 mL | Freq: Two times a day (BID) | OROMUCOSAL | Status: DC
  Administered 2019-12-04 – 2019-12-05 (×3): 15 mL via OROMUCOSAL

## 2019-12-04 MED ORDER — FENTANYL CITRATE (PF) 250 MCG/5ML IJ SOLN
INTRAMUSCULAR | Status: AC
  Filled 2019-12-04: qty 5

## 2019-12-04 MED ORDER — FENTANYL CITRATE (PF) 100 MCG/2ML IJ SOLN
25.0000 ug | INTRAMUSCULAR | Status: DC | PRN
  Administered 2019-12-04: 50 ug via INTRAVENOUS

## 2019-12-04 MED ORDER — FENTANYL CITRATE (PF) 100 MCG/2ML IJ SOLN
INTRAMUSCULAR | Status: AC
  Filled 2019-12-04: qty 2

## 2019-12-04 MED ORDER — IOHEXOL 350 MG/ML SOLN
100.0000 mL | Freq: Once | INTRAVENOUS | Status: AC | PRN
  Administered 2019-12-04: 75 mL via INTRAVENOUS

## 2019-12-04 MED ORDER — DOCUSATE SODIUM 50 MG/5ML PO LIQD
100.0000 mg | Freq: Two times a day (BID) | ORAL | Status: DC
  Administered 2019-12-06: 100 mg
  Filled 2019-12-04 (×2): qty 10

## 2019-12-04 MED ORDER — HEPARIN SODIUM (PORCINE) 1000 UNIT/ML IJ SOLN
INTRAMUSCULAR | Status: DC | PRN
  Administered 2019-12-04: 7000 [IU] via INTRAVENOUS

## 2019-12-04 MED ORDER — 0.9 % SODIUM CHLORIDE (POUR BTL) OPTIME
TOPICAL | Status: DC | PRN
  Administered 2019-12-04: 2000 mL

## 2019-12-04 MED ORDER — PROTAMINE SULFATE 10 MG/ML IV SOLN
INTRAVENOUS | Status: AC
  Filled 2019-12-04: qty 10

## 2019-12-04 MED ORDER — MORPHINE SULFATE (PF) 2 MG/ML IV SOLN
2.0000 mg | INTRAVENOUS | Status: DC | PRN
  Administered 2019-12-08 – 2019-12-11 (×5): 2 mg via INTRAVENOUS
  Filled 2019-12-04 (×5): qty 1

## 2019-12-04 MED ORDER — LIDOCAINE 2% (20 MG/ML) 5 ML SYRINGE
INTRAMUSCULAR | Status: AC
  Filled 2019-12-04: qty 5

## 2019-12-04 MED ORDER — HEMOSTATIC AGENTS (NO CHARGE) OPTIME
TOPICAL | Status: DC | PRN
  Administered 2019-12-04: 2 via TOPICAL
  Administered 2019-12-04: 1 via TOPICAL

## 2019-12-04 MED ORDER — ACETAMINOPHEN 325 MG RE SUPP
325.0000 mg | RECTAL | Status: DC | PRN
  Filled 2019-12-04: qty 2

## 2019-12-04 MED ORDER — ONDANSETRON HCL 4 MG/2ML IJ SOLN
INTRAMUSCULAR | Status: DC | PRN
  Administered 2019-12-04: 4 mg via INTRAVENOUS

## 2019-12-04 MED ORDER — SUCCINYLCHOLINE CHLORIDE 200 MG/10ML IV SOSY
PREFILLED_SYRINGE | INTRAVENOUS | Status: AC
  Filled 2019-12-04: qty 10

## 2019-12-04 MED ORDER — LABETALOL HCL 5 MG/ML IV SOLN
10.0000 mg | INTRAVENOUS | Status: DC | PRN
  Administered 2019-12-04: 10 mg via INTRAVENOUS
  Filled 2019-12-04: qty 4

## 2019-12-04 MED ORDER — LIDOCAINE HCL 1 % IJ SOLN
INTRAMUSCULAR | Status: AC
  Filled 2019-12-04: qty 20

## 2019-12-04 MED ORDER — DEXTRAN 40 IN D5W 10 % IV SOLN
25.0000 mL/h | Freq: Once | INTRAVENOUS | Status: DC
  Filled 2019-12-04: qty 500

## 2019-12-04 MED ORDER — PANTOPRAZOLE SODIUM 40 MG IV SOLR
40.0000 mg | Freq: Every day | INTRAVENOUS | Status: DC
  Administered 2019-12-04 – 2019-12-05 (×2): 40 mg via INTRAVENOUS
  Filled 2019-12-04 (×2): qty 40

## 2019-12-04 MED ORDER — PROPOFOL 10 MG/ML IV BOLUS
INTRAVENOUS | Status: DC | PRN
  Administered 2019-12-04: 90 mg via INTRAVENOUS

## 2019-12-04 MED ORDER — CEFAZOLIN SODIUM 1 G IJ SOLR
INTRAMUSCULAR | Status: AC
  Filled 2019-12-04: qty 20

## 2019-12-04 MED ORDER — ROCURONIUM BROMIDE 10 MG/ML (PF) SYRINGE
PREFILLED_SYRINGE | INTRAVENOUS | Status: DC | PRN
  Administered 2019-12-04 (×2): 10 mg via INTRAVENOUS
  Administered 2019-12-04: 50 mg via INTRAVENOUS
  Administered 2019-12-04: 20 mg via INTRAVENOUS

## 2019-12-04 MED ORDER — DEXAMETHASONE SODIUM PHOSPHATE 10 MG/ML IJ SOLN
INTRAMUSCULAR | Status: AC
  Filled 2019-12-04: qty 1

## 2019-12-04 MED ORDER — IPRATROPIUM-ALBUTEROL 0.5-2.5 (3) MG/3ML IN SOLN
3.0000 mL | Freq: Four times a day (QID) | RESPIRATORY_TRACT | Status: DC | PRN
  Administered 2019-12-08 – 2019-12-24 (×3): 3 mL via RESPIRATORY_TRACT
  Filled 2019-12-04 (×3): qty 3

## 2019-12-04 MED ORDER — DEXAMETHASONE SODIUM PHOSPHATE 10 MG/ML IJ SOLN
INTRAMUSCULAR | Status: DC | PRN
  Administered 2019-12-04: 6 mg via INTRAVENOUS

## 2019-12-04 MED ORDER — HEPARIN SODIUM (PORCINE) 1000 UNIT/ML IJ SOLN
INTRAMUSCULAR | Status: AC
  Filled 2019-12-04: qty 1

## 2019-12-04 MED ORDER — CLEVIDIPINE BUTYRATE 0.5 MG/ML IV EMUL
INTRAVENOUS | Status: DC | PRN
  Administered 2019-12-04: 2 mg/h via INTRAVENOUS

## 2019-12-04 MED ORDER — PHENYLEPHRINE 40 MCG/ML (10ML) SYRINGE FOR IV PUSH (FOR BLOOD PRESSURE SUPPORT)
PREFILLED_SYRINGE | INTRAVENOUS | Status: AC
  Filled 2019-12-04: qty 10

## 2019-12-04 MED ORDER — CEFAZOLIN SODIUM-DEXTROSE 2-4 GM/100ML-% IV SOLN
2.0000 g | Freq: Three times a day (TID) | INTRAVENOUS | Status: AC
  Administered 2019-12-05: 2 g via INTRAVENOUS
  Filled 2019-12-04: qty 100

## 2019-12-04 MED ORDER — PROTAMINE SULFATE 10 MG/ML IV SOLN
INTRAVENOUS | Status: AC
  Filled 2019-12-04: qty 5

## 2019-12-04 MED ORDER — MAGNESIUM SULFATE 2 GM/50ML IV SOLN: 2.0000 g | Freq: Every day | INTRAVENOUS | Status: DC | PRN

## 2019-12-04 MED ORDER — THROMBIN (RECOMBINANT) 20000 UNITS EX SOLR
CUTANEOUS | Status: AC
  Filled 2019-12-04: qty 20000

## 2019-12-04 MED ORDER — ACETAMINOPHEN 325 MG PO TABS
325.0000 mg | ORAL_TABLET | ORAL | Status: DC | PRN
  Filled 2019-12-04: qty 2

## 2019-12-04 MED ORDER — IPRATROPIUM-ALBUTEROL 0.5-2.5 (3) MG/3ML IN SOLN
3.0000 mL | Freq: Four times a day (QID) | RESPIRATORY_TRACT | Status: DC
  Administered 2019-12-04 – 2019-12-05 (×3): 3 mL via RESPIRATORY_TRACT
  Filled 2019-12-04 (×3): qty 3

## 2019-12-04 MED ORDER — PHENOL 1.4 % MT LIQD: 1.0000 | OROMUCOSAL | Status: DC | PRN

## 2019-12-04 MED ORDER — LIDOCAINE HCL (PF) 1 % IJ SOLN
INTRAMUSCULAR | Status: AC
  Filled 2019-12-04: qty 30

## 2019-12-04 MED ORDER — EPHEDRINE SULFATE-NACL 50-0.9 MG/10ML-% IV SOSY
PREFILLED_SYRINGE | INTRAVENOUS | Status: DC | PRN
  Administered 2019-12-04 (×4): 5 mg via INTRAVENOUS

## 2019-12-04 MED ORDER — GABAPENTIN 300 MG PO CAPS
300.0000 mg | ORAL_CAPSULE | Freq: Three times a day (TID) | ORAL | Status: DC
  Administered 2019-12-06: 300 mg via ORAL
  Filled 2019-12-04 (×2): qty 1

## 2019-12-04 MED ORDER — SODIUM CHLORIDE 0.9 % IV SOLN
INTRAVENOUS | Status: DC | PRN
Start: 2019-12-04 — End: 2019-12-04

## 2019-12-04 MED ORDER — FENTANYL CITRATE (PF) 100 MCG/2ML IJ SOLN
25.0000 ug | INTRAMUSCULAR | Status: DC | PRN
  Administered 2019-12-04: 100 ug via INTRAVENOUS
  Administered 2019-12-05: 25 ug via INTRAVENOUS
  Administered 2019-12-08: 50 ug via INTRAVENOUS
  Administered 2019-12-10: 100 ug via INTRAVENOUS
  Administered 2019-12-12: 50 ug via INTRAVENOUS
  Administered 2019-12-12 – 2019-12-17 (×14): 100 ug via INTRAVENOUS
  Administered 2019-12-18: 25 ug via INTRAVENOUS
  Administered 2019-12-18: 50 ug via INTRAVENOUS
  Administered 2019-12-18: 25 ug via INTRAVENOUS
  Administered 2019-12-19: 50 ug via INTRAVENOUS
  Administered 2019-12-22 – 2019-12-27 (×13): 100 ug via INTRAVENOUS
  Administered 2019-12-27: 50 ug via INTRAVENOUS
  Administered 2019-12-27 – 2020-01-03 (×20): 100 ug via INTRAVENOUS
  Administered 2020-01-04: 50 ug via INTRAVENOUS
  Filled 2019-12-04 (×55): qty 2

## 2019-12-04 MED ORDER — ASPIRIN EC 81 MG PO TBEC
81.0000 mg | DELAYED_RELEASE_TABLET | Freq: Every day | ORAL | Status: DC
  Administered 2019-12-06: 81 mg via ORAL
  Filled 2019-12-04: qty 1

## 2019-12-04 MED ORDER — DEXTRAN 40 IN D5W 10 % IV SOLN
INTRAVENOUS | Status: AC | PRN
  Administered 2019-12-04: 635 mL

## 2019-12-04 MED ORDER — PROPOFOL 10 MG/ML IV BOLUS
INTRAVENOUS | Status: DC | PRN
  Administered 2019-12-04: 140 mg via INTRAVENOUS

## 2019-12-04 MED ORDER — CHLORHEXIDINE GLUCONATE 0.12 % MT SOLN
15.0000 mL | Freq: Once | OROMUCOSAL | Status: AC
  Administered 2019-12-04: 15 mL via OROMUCOSAL
  Filled 2019-12-04: qty 15

## 2019-12-04 MED ORDER — CHLORHEXIDINE GLUCONATE CLOTH 2 % EX PADS
6.0000 | MEDICATED_PAD | Freq: Every day | CUTANEOUS | Status: DC
  Administered 2019-12-05 – 2019-12-25 (×16): 6 via TOPICAL

## 2019-12-04 MED ORDER — MIDAZOLAM HCL 2 MG/2ML IJ SOLN
INTRAMUSCULAR | Status: AC
  Filled 2019-12-04: qty 2

## 2019-12-04 MED ORDER — THROMBIN 20000 UNITS EX SOLR
OROMUCOSAL | Status: DC | PRN
  Administered 2019-12-04: 20 mL via TOPICAL

## 2019-12-04 MED ORDER — ONDANSETRON HCL 4 MG/2ML IJ SOLN
INTRAMUSCULAR | Status: AC
  Filled 2019-12-04: qty 2

## 2019-12-04 MED ORDER — ROCURONIUM BROMIDE 10 MG/ML (PF) SYRINGE
PREFILLED_SYRINGE | INTRAVENOUS | Status: DC | PRN
  Administered 2019-12-04 (×2): 50 mg via INTRAVENOUS

## 2019-12-04 MED ORDER — SODIUM CHLORIDE 0.9 % IV SOLN
INTRAVENOUS | Status: AC
  Filled 2019-12-04: qty 1.2

## 2019-12-04 MED ORDER — SUGAMMADEX SODIUM 200 MG/2ML IV SOLN
INTRAVENOUS | Status: DC | PRN
  Administered 2019-12-04: 200 mg via INTRAVENOUS

## 2019-12-04 MED ORDER — ORAL CARE MOUTH RINSE: 15.0000 mL | Freq: Once | OROMUCOSAL | Status: AC

## 2019-12-04 MED ORDER — BISMUTH SUBSALICYLATE 262 MG/15ML PO SUSP
30.0000 mL | Freq: Four times a day (QID) | ORAL | Status: DC | PRN
  Filled 2019-12-04: qty 236

## 2019-12-04 MED ORDER — SODIUM CHLORIDE 0.9 % IV SOLN
0.0125 ug/kg/min | INTRAVENOUS | Status: DC
  Filled 2019-12-04: qty 1000

## 2019-12-04 MED ORDER — CLOPIDOGREL BISULFATE 75 MG PO TABS
75.0000 mg | ORAL_TABLET | Freq: Every day | ORAL | Status: DC
  Administered 2019-12-06: 75 mg via ORAL
  Filled 2019-12-04: qty 1

## 2019-12-04 MED ORDER — LOPERAMIDE HCL 2 MG PO CAPS
2.0000 mg | ORAL_CAPSULE | Freq: Three times a day (TID) | ORAL | Status: DC | PRN
  Filled 2019-12-04: qty 1

## 2019-12-04 MED ORDER — SODIUM CHLORIDE 0.9 % IV SOLN
500.0000 mL | Freq: Once | INTRAVENOUS | Status: AC | PRN
  Administered 2019-12-07: 500 mL via INTRAVENOUS

## 2019-12-04 MED ORDER — HEPARIN SODIUM (PORCINE) 1000 UNIT/ML IJ SOLN
INTRAMUSCULAR | Status: AC
  Filled 2019-12-04: qty 2

## 2019-12-04 MED ORDER — PHENYLEPHRINE HCL-NACL 10-0.9 MG/250ML-% IV SOLN
INTRAVENOUS | Status: DC | PRN
Start: 2019-12-04 — End: 2019-12-04
  Administered 2019-12-04: 20 ug/min via INTRAVENOUS

## 2019-12-04 MED ORDER — GUAIFENESIN-DM 100-10 MG/5ML PO SYRP: 15.0000 mL | ORAL_SOLUTION | ORAL | Status: DC | PRN

## 2019-12-04 MED ORDER — ORAL CARE MOUTH RINSE
15.0000 mL | OROMUCOSAL | Status: DC
  Administered 2019-12-04 – 2019-12-05 (×5): 15 mL via OROMUCOSAL

## 2019-12-04 MED ORDER — PROTAMINE SULFATE 10 MG/ML IV SOLN
INTRAVENOUS | Status: DC | PRN
  Administered 2019-12-04: 20 mg via INTRAVENOUS
  Administered 2019-12-04: 10 mg via INTRAVENOUS
  Administered 2019-12-04 (×2): 20 mg via INTRAVENOUS

## 2019-12-04 MED ORDER — DOCUSATE SODIUM 100 MG PO CAPS: 100.0000 mg | ORAL_CAPSULE | Freq: Every day | ORAL | Status: DC

## 2019-12-04 MED ORDER — METOPROLOL TARTRATE 5 MG/5ML IV SOLN: 2.0000 mg | INTRAVENOUS | Status: DC | PRN

## 2019-12-04 MED ORDER — HEPARIN SODIUM (PORCINE) 1000 UNIT/ML IJ SOLN
INTRAMUSCULAR | Status: DC | PRN
Start: 2019-12-04 — End: 2019-12-04
  Administered 2019-12-04: 2000 [IU] via INTRAVENOUS
  Administered 2019-12-04: 7000 [IU] via INTRAVENOUS
  Administered 2019-12-04: 2000 [IU] via INTRAVENOUS

## 2019-12-04 MED ORDER — ONDANSETRON HCL 4 MG/2ML IJ SOLN
4.0000 mg | Freq: Four times a day (QID) | INTRAMUSCULAR | Status: DC | PRN
  Administered 2019-12-09: 4 mg via INTRAVENOUS
  Filled 2019-12-04: qty 2

## 2019-12-04 MED ORDER — OXYCODONE-ACETAMINOPHEN 5-325 MG PO TABS
1.0000 | ORAL_TABLET | ORAL | Status: DC | PRN
  Administered 2019-12-08 – 2019-12-10 (×3): 2 via ORAL
  Filled 2019-12-04 (×3): qty 2

## 2019-12-04 MED ORDER — HEMOSTATIC AGENTS (NO CHARGE) OPTIME
TOPICAL | Status: DC | PRN
  Administered 2019-12-04 (×2): 1 via TOPICAL

## 2019-12-04 MED ORDER — PROPOFOL 500 MG/50ML IV EMUL
INTRAVENOUS | Status: DC | PRN
  Administered 2019-12-04: 50 ug/kg/min via INTRAVENOUS

## 2019-12-04 MED ORDER — SODIUM CHLORIDE 0.9 % IV SOLN: INTRAVENOUS | Status: AC

## 2019-12-04 MED ORDER — LIDOCAINE-EPINEPHRINE (PF) 1 %-1:200000 IJ SOLN
INTRAMUSCULAR | Status: AC
  Filled 2019-12-04: qty 30

## 2019-12-04 SURGICAL SUPPLY — 59 items
BAG DECANTER FOR FLEXI CONT (MISCELLANEOUS) ×4 IMPLANT
BIOPATCH RED 1 DISK 7.0 (GAUZE/BANDAGES/DRESSINGS) ×3 IMPLANT
BIOPATCH RED 1IN DISK 7.0MM (GAUZE/BANDAGES/DRESSINGS) ×1
CANISTER SUCT 3000ML PPV (MISCELLANEOUS) ×4 IMPLANT
CANNULA VESSEL 3MM 2 BLNT TIP (CANNULA) ×12 IMPLANT
CATH EMB 3FR 40CM (CATHETERS) ×4 IMPLANT
CATH ROBINSON RED A/P 18FR (CATHETERS) ×4 IMPLANT
CATH SUCT 10FR WHISTLE TIP (CATHETERS) ×4 IMPLANT
CLIP VESOCCLUDE MED 24/CT (CLIP) ×4 IMPLANT
CLIP VESOCCLUDE SM WIDE 24/CT (CLIP) ×4 IMPLANT
CNTNR URN SCR LID CUP LEK RST (MISCELLANEOUS) ×2 IMPLANT
CONT SPEC 4OZ STRL OR WHT (MISCELLANEOUS) ×2
DERMABOND ADVANCED (GAUZE/BANDAGES/DRESSINGS) ×2
DERMABOND ADVANCED .7 DNX12 (GAUZE/BANDAGES/DRESSINGS) ×2 IMPLANT
DRAIN CHANNEL 15F RND FF W/TCR (WOUND CARE) ×4 IMPLANT
DRAPE INCISE IOBAN 66X45 STRL (DRAPES) ×4 IMPLANT
DRAPE ORTHO SPLIT 77X108 STRL (DRAPES) ×2
DRAPE SURG ORHT 6 SPLT 77X108 (DRAPES) ×2 IMPLANT
DRSG TEGADERM 4X4.75 (GAUZE/BANDAGES/DRESSINGS) ×4 IMPLANT
ELECT REM PT RETURN 9FT ADLT (ELECTROSURGICAL) ×4
ELECTRODE REM PT RTRN 9FT ADLT (ELECTROSURGICAL) ×2 IMPLANT
EVACUATOR SILICONE 100CC (DRAIN) ×4 IMPLANT
GAUZE SPONGE 4X4 12PLY STRL (GAUZE/BANDAGES/DRESSINGS) ×4 IMPLANT
GLOVE BIO SURGEON STRL SZ7.5 (GLOVE) ×8 IMPLANT
GLOVE BIOGEL PI IND STRL 6.5 (GLOVE) ×4 IMPLANT
GLOVE BIOGEL PI IND STRL 7.0 (GLOVE) ×2 IMPLANT
GLOVE BIOGEL PI IND STRL 8 (GLOVE) ×2 IMPLANT
GLOVE BIOGEL PI INDICATOR 6.5 (GLOVE) ×4
GLOVE BIOGEL PI INDICATOR 7.0 (GLOVE) ×2
GLOVE BIOGEL PI INDICATOR 8 (GLOVE) ×2
GLOVE ECLIPSE 8.5 STRL (GLOVE) ×8 IMPLANT
GOWN STRL REUS W/ TWL LRG LVL3 (GOWN DISPOSABLE) ×6 IMPLANT
GOWN STRL REUS W/TWL LRG LVL3 (GOWN DISPOSABLE) ×6
HEMOSTAT HEMOBLAST BELLOWS (HEMOSTASIS) ×4 IMPLANT
KIT BASIN OR (CUSTOM PROCEDURE TRAY) ×4 IMPLANT
KIT SHUNT ARGYLE CAROTID ART 6 (VASCULAR PRODUCTS) IMPLANT
KIT TURNOVER KIT B (KITS) ×4 IMPLANT
LOOP VESSEL MINI RED (MISCELLANEOUS) ×4 IMPLANT
NEEDLE HYPO 25GX1X1/2 BEV (NEEDLE) ×4 IMPLANT
NS IRRIG 1000ML POUR BTL (IV SOLUTION) ×8 IMPLANT
PACK CAROTID (CUSTOM PROCEDURE TRAY) ×4 IMPLANT
PAD ARMBOARD 7.5X6 YLW CONV (MISCELLANEOUS) ×8 IMPLANT
POSITIONER HEAD DONUT 9IN (MISCELLANEOUS) ×4 IMPLANT
SHUNT CAROTID BYPASS 10 (VASCULAR PRODUCTS) IMPLANT
SHUNT CAROTID BYPASS 12 (VASCULAR PRODUCTS) IMPLANT
SPONGE SURGIFOAM ABS GEL 100 (HEMOSTASIS) ×4 IMPLANT
SUT ETHILON 2 0 FS 18 (SUTURE) ×4 IMPLANT
SUT ETHILON 3 0 PS 1 (SUTURE) ×4 IMPLANT
SUT PROLENE 6 0 BV (SUTURE) ×28 IMPLANT
SUT PROLENE 6 0 CC (SUTURE) ×4 IMPLANT
SUT SILK 2 0 PERMA HAND 18 BK (SUTURE) IMPLANT
SUT VIC AB 3-0 SH 27 (SUTURE) ×4
SUT VIC AB 3-0 SH 27X BRD (SUTURE) ×4 IMPLANT
SUT VICRYL 4-0 PS2 18IN ABS (SUTURE) ×4 IMPLANT
SYR 20ML LL LF (SYRINGE) ×8 IMPLANT
SYR 3ML LL SCALE MARK (SYRINGE) ×4 IMPLANT
SYR CONTROL 10ML LL (SYRINGE) ×4 IMPLANT
TOWEL GREEN STERILE (TOWEL DISPOSABLE) ×4 IMPLANT
WATER STERILE IRR 1000ML POUR (IV SOLUTION) ×4 IMPLANT

## 2019-12-04 SURGICAL SUPPLY — 59 items
BAG ISOLATION DRAPE 18X18 (DRAPES) ×4 IMPLANT
BIOPATCH RED 1 DISK 7.0 (GAUZE/BANDAGES/DRESSINGS) ×3 IMPLANT
CANISTER SUCT 3000ML PPV (MISCELLANEOUS) ×3 IMPLANT
CANNULA VESSEL 3MM 2 BLNT TIP (CANNULA) ×6 IMPLANT
CATH ROBINSON RED A/P 18FR (CATHETERS) ×3 IMPLANT
CLIP VESOCCLUDE MED 6/CT (CLIP) ×3 IMPLANT
CLIP VESOCCLUDE SM WIDE 6/CT (CLIP) ×6 IMPLANT
COVER PROBE W GEL 5X96 (DRAPES) ×3 IMPLANT
COVER WAND RF STERILE (DRAPES) IMPLANT
DECANTER SPIKE VIAL GLASS SM (MISCELLANEOUS) IMPLANT
DERMABOND ADVANCED (GAUZE/BANDAGES/DRESSINGS) ×1
DERMABOND ADVANCED .7 DNX12 (GAUZE/BANDAGES/DRESSINGS) ×2 IMPLANT
DRAIN HEMOVAC 1/8 X 5 (WOUND CARE) ×3 IMPLANT
DRAPE INCISE IOBAN 66X45 STRL (DRAPES) ×3 IMPLANT
DRAPE ISOLATION BAG 18X18 (DRAPES) ×2
DRAPE UNIVERSAL PACK (DRAPES) ×3 IMPLANT
ELECT REM PT RETURN 9FT ADLT (ELECTROSURGICAL) ×3
ELECTRODE REM PT RTRN 9FT ADLT (ELECTROSURGICAL) ×2 IMPLANT
EVACUATOR SILICONE 100CC (DRAIN) ×3 IMPLANT
GAUZE 4X4 16PLY RFD (DISPOSABLE) ×3 IMPLANT
GAUZE SPONGE 4X4 12PLY STRL (GAUZE/BANDAGES/DRESSINGS) ×3 IMPLANT
GLOVE BIO SURGEON STRL SZ7.5 (GLOVE) ×6 IMPLANT
GLOVE BIOGEL PI IND STRL 6.5 (GLOVE) ×8 IMPLANT
GLOVE BIOGEL PI IND STRL 7.5 (GLOVE) ×4 IMPLANT
GLOVE BIOGEL PI IND STRL 8 (GLOVE) ×2 IMPLANT
GLOVE BIOGEL PI INDICATOR 6.5 (GLOVE) ×4
GLOVE BIOGEL PI INDICATOR 7.5 (GLOVE) ×2
GLOVE BIOGEL PI INDICATOR 8 (GLOVE) ×1
GLOVE ECLIPSE 7.5 STRL STRAW (GLOVE) ×3 IMPLANT
GOWN STRL REUS W/ TWL LRG LVL3 (GOWN DISPOSABLE) ×8 IMPLANT
GOWN STRL REUS W/ TWL XL LVL3 (GOWN DISPOSABLE) ×2 IMPLANT
GOWN STRL REUS W/TWL LRG LVL3 (GOWN DISPOSABLE) ×4
GOWN STRL REUS W/TWL XL LVL3 (GOWN DISPOSABLE) ×1
HEMOSTAT SPONGE AVITENE ULTRA (HEMOSTASIS) ×9 IMPLANT
KIT BASIN OR (CUSTOM PROCEDURE TRAY) ×3 IMPLANT
KIT SHUNT ARGYLE CAROTID ART 6 (VASCULAR PRODUCTS) IMPLANT
KIT TURNOVER KIT B (KITS) ×3 IMPLANT
LOOP VESSEL MAXI BLUE (MISCELLANEOUS) ×3 IMPLANT
LOOP VESSEL MINI RED (MISCELLANEOUS) ×3 IMPLANT
NEEDLE HYPO 25GX1X1/2 BEV (NEEDLE) IMPLANT
NS IRRIG 1000ML POUR BTL (IV SOLUTION) ×6 IMPLANT
PACK CAROTID (CUSTOM PROCEDURE TRAY) ×3 IMPLANT
PAD ARMBOARD 7.5X6 YLW CONV (MISCELLANEOUS) ×6 IMPLANT
PATCH HEMASHIELD 8X150 (Vascular Products) ×3 IMPLANT
POSITIONER HEAD DONUT 9IN (MISCELLANEOUS) ×3 IMPLANT
SHUNT CAROTID BYPASS 10 (VASCULAR PRODUCTS) ×3 IMPLANT
SHUNT CAROTID BYPASS 12FRX15.5 (VASCULAR PRODUCTS) IMPLANT
SUT ETHILON 3 0 PS 1 (SUTURE) ×3 IMPLANT
SUT PROLENE 5 0 C 1 24 (SUTURE) ×3 IMPLANT
SUT PROLENE 6 0 CC (SUTURE) ×18 IMPLANT
SUT SILK 3 0 TIES 17X18 (SUTURE)
SUT SILK 3-0 18XBRD TIE BLK (SUTURE) IMPLANT
SUT VIC AB 3-0 SH 27 (SUTURE) ×1
SUT VIC AB 3-0 SH 27X BRD (SUTURE) ×2 IMPLANT
SUT VICRYL 4-0 PS2 18IN ABS (SUTURE) ×3 IMPLANT
SYR CONTROL 10ML LL (SYRINGE) IMPLANT
TAPE CLOTH SURG 4X10 WHT LF (GAUZE/BANDAGES/DRESSINGS) ×3 IMPLANT
TOWEL GREEN STERILE (TOWEL DISPOSABLE) ×6 IMPLANT
WATER STERILE IRR 1000ML POUR (IV SOLUTION) ×3 IMPLANT

## 2019-12-04 NOTE — Significant Event (Signed)
Rapid Response Event Note  Reason for Call : Neuro change since arrival from PACU after carotid endarderectomy.   Initial Focused Assessment:  On arrival pt laying in bed alert, but not talking, following commands or moving extremities. Per RN pt was talking and moving extremities in PACU about an hour prior. Last documented NIH was 2 at 1400.   Vascular team at bedside on my arrival with plans to take pt emergently back to OR.   VS: HR 101, BP 169/62, RR 14, spO2 94% on RA CBG: 192 Interventions: Stroke RN called at 1522 and informed of situation.  Bedside doppler performed- occlusion R ICA Pt taken to OR emergently  Plan of Care: After OR plan for pt to go straight for CTA. PACU nurse and OR team made aware. PACU RN will call once pt is out of OR.    Event Summary:  MD Notified: Dr Scot Dock (notified prior to my arrival) Call Time: [place] Time: [place] Time: [place]  Sherilyn Dacosta, RN

## 2019-12-04 NOTE — Anesthesia Procedure Notes (Signed)
Procedure Name: Intubation Date/Time: 12/02/2019 9:17 AM Performed by: Orlie Dakin, CRNA Pre-anesthesia Checklist: Patient identified, Emergency Drugs available, Suction available and Patient being monitored Patient Re-evaluated:Patient Re-evaluated prior to induction Oxygen Delivery Method: Circle system utilized Preoxygenation: Pre-oxygenation with 100% oxygen Induction Type: IV induction Ventilation: Mask ventilation without difficulty Laryngoscope Size: Miller and 3 Grade View: Grade I Tube type: Oral Tube size: 7.5 mm Number of attempts: 1 Airway Equipment and Method: Stylet Placement Confirmation: ETT inserted through vocal cords under direct vision,  positive ETCO2 and breath sounds checked- equal and bilateral Secured at: 23 cm Tube secured with: Tape Dental Injury: Teeth and Oropharynx as per pre-operative assessment  Comments: 4x4s bite block used.

## 2019-12-04 NOTE — Anesthesia Postprocedure Evaluation (Signed)
Anesthesia Post Note  Patient: Kelly Williams  Procedure(s) Performed: EXPLORATION OF NECK, PLACEMENT OF 60f BLAKE DRAIN (Right Neck) THROMBECTOMY CAROTID ARTERY (Right )     Patient location during evaluation: SICU Anesthesia Type: General Level of consciousness: sedated Pain management: pain level controlled Vital Signs Assessment: post-procedure vital signs reviewed and stable Respiratory status: patient remains intubated per anesthesia plan Cardiovascular status: stable Postop Assessment: no apparent nausea or vomiting Anesthetic complications: no   No complications documented.  Last Vitals:  Vitals:   12/11/2019 1958 12/27/2019 2000  BP:  (!) 160/85  Pulse:  82  Resp:  18  Temp:    SpO2: 100% 100%    Last Pain:  Vitals:   12/08/2019 1430  TempSrc:   PainSc: 2                  Kelly Williams

## 2019-12-04 NOTE — Op Note (Signed)
NAME: Kelly Williams    MRN: [id number] DOB: October 16, 1940    DATE OF OPERATION: 12/24/2019  PREOP DIAGNOSIS:    Postoperative change in neurologic status  POSTOP DIAGNOSIS:    Same  PROCEDURE:    Exploration right carotid endarterectomy site and right ICA thrombectomy  SURGEON: Judeth Cornfield. Scot Dock, MD  ASSIST: Arlee Muslim, PA  ANESTHESIA: General  EBL: 100 cc  INDICATIONS:    Kelly Williams is a 79 y.o. male who underwent a redo right carotid endarterectomy earlier this morning by Dr. Oneida Alar. Initially he did well in the recovery room and was transferred to 4 E. He then had an acute change in mental status was not following commands or moving either side. A stat carotid duplex scan appeared to show an occluded right ICA. He was taken urgently to the operating room for exploration.  FINDINGS:   The ICA was open. There were no technical defects noted. A 3 Fogarty catheter was passed gently up the internal carotid artery for approximately 4 to 5 cm and gently retrieved. No clot was retrieved. The artery was irrigated with copious amounts of heparin and dextran and then the patch closed primarily.  TECHNIQUE:   The patient was taken to the operating room and received a general anesthetic. After careful positioning the right neck was prepped and draped in usual sterile fashion. We also prepped the left thigh in case we needed a vein patch. The previous incision was opened. The dissection was carried down to the common carotid artery. The internal carotid artery was controlled above the patch. The external carotid artery and 2 branches were controlled medially. The common carotid artery was controlled below the nerve which was felt to potentially be a nonrecurrent laryngeal nerve. The patient had been heparinized prior to all this. We did monitor ACT throughout the procedure and was difficult to get adequate anticoagulation suggesting possibly an Antithrombin III deficiency. I then  clamped the common carotid artery and external carotid artery and open the patch. There was good backbleeding from above. The patch was opened for almost its entire length. I gently passed a 3 Fogarty catheter for approximately 4 to 5 cm up the internal carotid artery and gently retrieve this no clot was retrieved. The internal carotid artery was then clamped. The artery was irrigated with copious amounts of heparin and dextran. There were really no technical defects noted. I do not see an indication to repeat a new patch and therefore close the patch primarily with running 6-0 Prolene suture. Prior to completing the closure the arteries were backbled and flushed appropriately. There was excellent inflow and excellent backbleeding from the external carotid artery and the internal carotid artery. Closure then established first to the external carotid artery and into the internal carotid artery. At the completion there was a good signal in the internal carotid artery with diastolic flow. There was some generalized oozing and we used topical agents and ultimately had to give  protamine to get adequate hemostasis. Once hemostasis was obtained the wound was irrigated and a 15 Blake drain was placed. The wound was then closed with a deep layer of 3-0 Vicryl, the platysma with running 3-0 Vicryl, and the skin closed with 4-0 Vicryl. The patient was left intubated for emergent transfer to the CT scanner for CT of the head and neck. I updated the family (his daughter [name]), and also spoke with Dr. Leonel Ramsay from neurology.  Given the complexity of the case a first assistant  was necessary in order to expedient the procedure and safely perform the technical aspects of the operation.  Deitra Mayo, MD, FACS [facility]  DATE OF DICTATION:   12/20/2019

## 2019-12-04 NOTE — Interval H&P Note (Signed)
History and Physical Interval Note:  12/30/2019 7:23 AM  Kelly Williams  has presented today for surgery, with the diagnosis of RIGHT CAROTID STENOSIS.  The various methods of treatment have been discussed with the patient and family. After consideration of risks, benefits and other options for treatment, the patient has consented to  Procedure(s): REDO RIGHT CAROTID ENDARTERECTOMY (Right) VEIN HARVEST (Bilateral) as a surgical intervention.  The patient's history has been reviewed, patient examined, no change in status, stable for surgery.  I have reviewed the patient's chart and labs.  Questions were answered to the patient's satisfaction.     Ruta Hinds

## 2019-12-04 NOTE — Consult Note (Addendum)
 NAME:  Kelly Williams, MRN:  [id number], DOB:  11/09/1940, LOS: 0 ADMISSION DATE:  12/13/2019, CONSULTATION DATE:  12/02/2019 REFERRING MD:  Fields  CHIEF COMPLAINT:  Vent Management   Brief History   Kelly Williams is a 79 y.o. male who was admitted 8/3 for planned re-do of right CEA after CTA on prior admission revealed bilateral carotid stenosis R > L. Post procedure, he had a change in mental status and STAT duplex was concerning for ICA occlusion.  He was taken back to OR; however, ICA was found to be patent. He was subsequently sent for STAT CTA head which did not show any acute event. He then returned to the ICU and PCCM was asked to assist with vent management.  History of present illness   Pt is encephelopathic; therefore, this HPI is obtained from chart review. Kelly Williams is a 79 y.o. male who has a PMH as outlined below.  He had admission 11/25/19 through 11/29/19 for dysarthria.  MRI revealed acute right frontal infarcts in addition to chronic infarcts.  CTA head/neck revealed bilateral carotid artery stenosis.  He was evaluated by neuro who recommended ASA, plavix, ASA.  In regards to his carotid artery disease, vascular recommended redo of CEA as an outpatient.  He then presented to [facility] 8/[facility] for redo right CEA.  This was performed successfully and following the OR, pt was transferred to the floor; however, after arrival to floor, he was noted to have a change as he was not able to follow commands.  He had STAT carotid duplex which revealed total occlusion of the right ICA.  He was subsequently taken back to the OR emergently.  Despite carotid duplex findings, upon re-exploration in OR, the ICA was open and no defects were noted / no clot was retrieved.  The artery was irrigated with heparin and dextran and patch was closed.  He was then taken from OR back to CT for CTA of the head and neck.  This was negative for any acute event.  Neuro considering possible reperfusion syndrome vs seizure  as explanation for AMS?  Upon arrival back to the ICU, PCCM was asked to assist with vent management.  Past Medical History  has Diabetic retinopathy (HCC); Coronary artery disease; Carotid arterial disease (HCC); Memory difficulties; Diabetic autonomic neuropathy associated with type 2 diabetes mellitus (HCC); Status post lumbar spinal fusion; S/P CABG x 5; Chronic obstructive pulmonary disease (HCC); Shortness of breath; Diabetic nephropathy associated with type 2 diabetes mellitus (HCC); Type 2 diabetes mellitus with diabetic neuropathy, unspecified (HCC); Tachycardia with heart rate 100-120 beats per minute; Acromioclavicular arthrosis; Renal insufficiency; At moderate risk for fall; Ambulates with cane; Trigger finger, right ring finger; Normocytic anemia; Cervical spondylosis; Loose stools; Gait disturbance; Injury of left shoulder; Rotator cuff tear, right; Dizziness; Essential hypertension; History of CVA (cerebrovascular accident); Bruit; Unintentional weight loss; CVA (cerebral vascular accident) (HCC); Acute CVA (cerebrovascular accident) (HCC); and Carotid artery stenosis on their problem list.  Significant Hospital Events   8/3 > admit.  Consults:  Neuro, PCCM.  Procedures:  ETT 8/3 >  Art line 8/3 >   Significant Diagnostic Tests:  Carotid duplex 8/3 > right ICA occlusion. CTA head / neck 8/3 >  EEG 8/3 >  MRI brain 8/3 >   Micro Data:  None.  Antimicrobials:  None.   Interim history/subjective:  Sedated on propofol.  Objective:  Blood pressure (!) 146/72, pulse 100, temperature 98 F (36.7 C), resp. rate 14, height   5' 6" (1.676 m), weight 63.5 kg, SpO2 100 %.    Vent Mode: PRVC FiO2 (%):  [50 %] 50 % Set Rate:  [18 bmp] 18 bmp Vt Set:  [510 mL] 510 mL PEEP:  [5 cmH20] 5 cmH20 Plateau Pressure:  [17 cmH20] 17 cmH20   Intake/Output Summary (Last 24 hours) at 12/13/2019 1839 Last data filed at 12/27/2019 1740 Gross per 24 hour  Intake 3550 ml  Output 1170 ml    Net 2380 ml   Filed Weights   01/01/2020 0731  Weight: 63.5 kg    Examination: General: Adult male, resting in bed, in NAD. Neuro: Sedated, not responsive. HEENT: [place]/AT. Sclerae anicteric. ETT in place.  Neck drain in place with minimal bloody drainage. Cardiovascular: RRR, no M/R/G.  Lungs: Respirations even and unlabored.  CTA bilaterally, No W/R/R.  Abdomen: BS x 4, soft, NT/ND.  Musculoskeletal: No gross deformities, no edema.  Skin: Intact, warm, no rashes.    Assessment & Plan:   Respiratory insufficiency - due to need for airway protection during OR case. Hx COPD. - Full vent support for tonight. - SBT in AM for hopeful extubation if mental status allows. - Bronchial hygiene. - BD's. - Assess CXR.  Bilateral carotid artery stenosis R > L - s/p redo of right CEA 8/3.  Complicated by change in neuro status post op and concern for ICA occlusion; therefore, taken back to OR for re-exploration but found to have no occlusion. - Post op care per vascular.  ? Reperfusion syndrome vs seizures. Hx CVA (recent acute right frontal and parietal lobes in addition to chronic right MCA territory, b/l occipital, right temporal, left cerebellum). - Per neuro. - F/u on EEG, MRI. - Continue home ASA, plavix.  Hx HTN, HLD, CAD. - SBP goal 120-140 per neuro / vascular. - Continue home ASA, atorvastatin, clopidogrel. - Hold home losartan.  Hx CKD. - Supportive care. - Follow BMP.  Hx DM. - SSI. - Hold home trulicity.  Hx EtOH use, peripheral neuropathy. - Thiamine / folate. - Continue home gabapentin.   Best Practice:  Diet: NPO. Pain/Anxiety/Delirium protocol (if indicated): Propofol gtt / Fentanyl PRN.  RASS goal -1. VAP protocol (if indicated): In place. DVT prophylaxis: SCD's / Heparin. GI prophylaxis: PPI. Glucose control: SSI. Mobility: Bedrest. Code Status: Full. Family Communication: Per family. Disposition: ICU.  Labs   CBC: Recent Labs  Lab  12/02/2019 0837  WBC 10.5  HGB 11.5*  HCT 36.1*  MCV 95.0  PLT 354   Basic Metabolic Panel: Recent Labs  Lab 11/28/19 0327 11/29/19 0242 12/07/2019 0837  NA 143 140 139  K 4.0 4.1 4.4  CL 117* 113* 109  CO2 21* 19* 21*  GLUCOSE 129* 103* 116*  BUN 19 24* 24*  CREATININE 1.13 1.58* 1.22  CALCIUM 8.2* 8.1* 9.0  MG  --  1.9  --    GFR: Estimated Creatinine Clearance: 44.1 mL/min (by C-G formula based on SCr of 1.22 mg/dL). Recent Labs  Lab 12/19/2019 0837  WBC 10.5   Liver Function Tests: Recent Labs  Lab 12/25/2019 0837  AST 34  ALT 40  ALKPHOS 82  BILITOT 0.7  PROT 7.2  ALBUMIN 3.6   No results for input(s): LIPASE, AMYLASE in the last 168 hours. No results for input(s): AMMONIA in the last 168 hours. ABG    Component Value Date/Time   TCO2 21 (L) 11/26/2019 0034    Coagulation Profile: Recent Labs  Lab 12/14/2019 0837  INR 1.0     Cardiac Enzymes: No results for input(s): CKTOTAL, CKMB, CKMBINDEX, TROPONINI in the last 168 hours. HbA1C: Hemoglobin A1C  Date/Time Value Ref Range Status  06/14/2019 09:18 AM 5.8 (A) 4.0 - 5.6 % Final   HbA1c, POC (prediabetic range)  Date/Time Value Ref Range Status  01/12/2019 04:58 PM 5.9 5.7 - 6.4 % Final  10/27/2018 01:36 PM 6.0 5.7 - 6.4 % Final   Hgb A1c MFr Bld  Date/Time Value Ref Range Status  11/27/2019 03:13 AM 6.3 (H) 4.8 - 5.6 % Final    Comment:    (NOTE) Pre diabetes:          5.7%-6.4%  Diabetes:              >6.4%  Glycemic control for   <7.0% adults with diabetes    CBG: Recent Labs  Lab 11/29/19 1143 11/29/19 1556 12/21/2019 0731 12/29/2019 1436 12/26/2019 1535  GLUCAP 83 100* 111*  111* 176* 192*    Review of Systems:   Unable to obtain as pt is encephalopathic.  Past medical history  He,  has a past medical history of Alcohol abuse, Alcohol abuse, Arthritis, Carotid arterial disease (HCC), Chronic kidney disease, COPD (chronic obstructive pulmonary disease) (HCC), Coronary artery disease,  Diabetes mellitus without complication (HCC), Diabetic retinopathy (HCC), Hyperlipidemia, Hypertension, Normocytic anemia (01/16/2019), and Stroke (HCC).   Surgical History    Past Surgical History:  Procedure Laterality Date  . APPENDECTOMY     as teenager  . BACK SURGERY    . CAROTID ENDARTERECTOMY Right   . CORONARY ARTERY BYPASS GRAFT     10-12 years ago in [place] at [facility]  . IR PATIENT EVAL TECH 0-60 MINS  11/27/2019  . REVERSE SHOULDER ARTHROPLASTY Right 07/18/2019   Procedure: REVERSE SHOULDER ARTHROPLASTY WITH SUBSCAPULAR REPAIR;  Surgeon: Varkey, Dax T, MD;  Location: [facility];  Service: Orthopedics;  Laterality: Right;  . VASECTOMY       Social History   reports that he quit smoking about 17 years ago. His smoking use included cigarettes. He has a 10.00 pack-year smoking history. He has never used smokeless tobacco. He reports previous alcohol use. He reports that he does not use drugs.   Family history   His family history includes Hypertension in his father and mother.   Allergies Allergies  Allergen Reactions  . Metformin And Related Diarrhea     Home meds  Prior to Admission medications   Medication Sig Start Date End Date Taking? Authorizing Provider  albuterol (PROVENTIL HFA;VENTOLIN HFA) 108 (90 Base) MCG/ACT inhaler Inhale 1-2 puffs into the lungs every 4 (four) hours as needed for wheezing or shortness of breath (bronchospasm). 06/20/18  Yes Cummings, Charley Elizabeth, PA-C  Alcohol Swabs (B-D SINGLE USE SWABS REGULAR) PADS Use to check glucose daily Patient taking differently: 1 each by Other route See admin instructions. Use to check glucose daily 09/07/19  Yes Matthews, Cody, DO  aspirin EC 325 MG EC tablet Take 1 tablet (325 mg total) by mouth daily. 11/30/19  Yes Adhikari, Amrit, MD  atorvastatin (LIPITOR) 80 MG tablet Take 1 tablet (80 mg total) by mouth at bedtime. 11/29/19  Yes Adhikari, Amrit, MD  Blood Glucose Monitoring Suppl (TRUE METRIX AIR  GLUCOSE METER) DEVI Dx DM E11.9 Check fasting blood sugar every morning and once 2 hours after largest meal of the day. Patient taking differently: 1 each by Other route See admin instructions. Dx DM E11.9 Check fasting blood sugar every morning and once 2 hours   after largest meal of the day. 09/13/19  Yes Luetta Nutting, DO  celecoxib (CELEBREX) 100 MG capsule Take 1 capsule (100 mg total) by mouth 2 (two) times daily as needed for mild pain. Patient taking differently: Take 200 mg by mouth 2 (two) times daily as needed for mild pain.  09/10/19  Yes Luetta Nutting, DO  clopidogrel (PLAVIX) 75 MG tablet Take 1 tablet (75 mg total) by mouth daily. 11/30/19  Yes Adhikari, Tamsen Meek, MD  Dulaglutide (TRULICITY) 1.22 QM/2.5OI SOPN Inject 0.75 mg into the skin once a week. Patient taking differently: Inject 0.75 mg into the skin once a week. Saturday 09/10/19  Yes Luetta Nutting, DO  gabapentin (NEURONTIN) 300 MG capsule Take 1 capsule (300 mg total) by mouth 3 (three) times daily. 09/07/19  Yes Luetta Nutting, DO  glucose blood (TRUE METRIX BLOOD GLUCOSE TEST) test strip Use as instructed Patient taking differently: 1 each by Other route as directed.  09/07/19  Yes Luetta Nutting, DO  losartan (COZAAR) 25 MG tablet Take 1 tablet (25 mg total) by mouth every morning. Patient taking differently: Take 25 mg by mouth at bedtime.  06/25/19  Yes Buford Dresser, MD  TRUEplus Lancets 30G MISC Use to check glucose daily Patient taking differently: 1 each by Other route daily.  09/07/19  Yes Luetta Nutting, DO  acetaminophen (TYLENOL) 500 MG tablet Take 1,000 mg by mouth every 6 (six) hours as needed for mild pain.    [provider]  bismuth subsalicylate (PEPTO BISMOL) 262 MG/15ML suspension Take 30 mLs by mouth every 6 (six) hours as needed for indigestion or diarrhea or loose stools.    [provider]  ipratropium-albuterol (DUONEB) 0.5-2.5 (3) MG/3ML SOLN Take 3 mLs by nebulization every 6 (six)  hours as needed. Patient taking differently: Take 3 mLs by nebulization every 6 (six) hours as needed (COPD).  10/27/18   Trixie Dredge, PA-C  loperamide (IMODIUM) 2 MG capsule Take 1 capsule (2 mg total) by mouth every 8 (eight) hours as needed for diarrhea or loose stools. 11/29/19   Shelly Coss, MD  Respiratory Therapy Supplies (NEBULIZER/TUBING/MOUTHPIECE) KIT Use as directed with nebulizer treatments (Duoneb) every 4 hours prn. Dx: J44.9 Patient taking differently: 1 each by Other route See admin instructions. Use as directed with nebulizer treatments (Duoneb) every 4 hours prn. Dx: J44.9 06/20/18   Trixie Dredge, PA-C  Tiotropium Bromide-Olodaterol (STIOLTO RESPIMAT) 2.5-2.5 MCG/ACT AERS Inhale 2 puffs into the lungs daily. Patient not taking: Reported on 11/26/2019 10/27/18   Trixie Dredge, PA-C    Critical care time: 40 min.    Montey Hora, [facility] 12/08/2019, 6:39 PM

## 2019-12-04 NOTE — Anesthesia Postprocedure Evaluation (Signed)
Anesthesia Post Note  Patient: Kelly Williams  Procedure(s) Performed: REDO RIGHT CAROTID ENDARTERECTOMY (Right Neck) PATCH ANGIOPLASTY USING HEMASHIELD PLATINUM FINESSE CARDIOVASCULAR PATCH (Right Neck)     Patient location during evaluation: PACU Anesthesia Type: General Level of consciousness: awake and alert Pain management: pain level controlled Vital Signs Assessment: post-procedure vital signs reviewed and stable Respiratory status: spontaneous breathing, nonlabored ventilation, respiratory function stable and patient connected to nasal cannula oxygen Cardiovascular status: blood pressure returned to baseline and stable Postop Assessment: no apparent nausea or vomiting Anesthetic complications: no Comments: Pt transferred to floor. Pt no longer following commands after period of time on the floor. Pt returned to OR for surgical exploration and possible thrombectomy.   This note was initiated for the 1st surgery to complete the chart. The second post op note reflects the second case.    No complications documented.  Last Vitals:  Vitals:   12/03/2019 1430 12/14/2019 1520  BP: 123/62 (!) 146/72  Pulse: 94 100  Resp: 13 14  Temp:    SpO2: 96% 94%    Last Pain:  Vitals:   12/13/2019 1430  TempSrc:   PainSc: 2                  Effie Berkshire

## 2019-12-04 NOTE — Op Note (Signed)
Procedure: Redo right carotid endarterectomy  Preoperative diagnosis: Symptomatic recurrent stenosis right internal carotid artery  Postoperative diagnosis: Same  Anesthesia: General  Assistant: Gae Gallop, MD, Arlee Muslim, PA-C, assistant was necessary for expediting procedure adequacy of of exposure and creation of anastomosis  Operative details: After team informed consent, the patient taken the operating.  The patient was placed in supine position operating table.  After induction general anesthesia endotracheal ovation patient was prepped and draped in usual sterile fashion on the entire right neck and chest.  Lower extremities were also prepped in the event of vein graft was necessary.  Next an oblique incision was made on the right side of the neck carried down through subcutaneous tissues and through the platysma.  Dissection was carried down the level of the omohyoid muscle and this was dissected free circumferentially and transected.  Common carotid artery and vagus nerve were identified at the base the incision.  Common carotid artery was dissected free circumferentially Vesely placed around this.  There were some filmy adhesions around the common carotid artery at this location but these were fairly easily dissected off.  I was able did identify the internal jugular vein and this was peeled away from the common carotid artery.  Dissection then proceeded up to the level of the carotid bifurcation.  The adhesions became much more dense in this location and required fairly tedious dissection.  I was finally able to identify the hypoglossal nerve and this was protected.  More dense adhesions were taken down separating the internal from the external carotid artery.  The vagus nerve was also adherent to the posterior wall of the internal carotid and common carotid artery as I were tired in the neck and this was taken down with sharp dissection.  The internal carotid artery and external carotid  arteries were dissected free circumferentially and Vesseloops placed around them.  The distal internal carotid artery was soft on palpation at this level.  Common carotid artery had a umbilical tape placed around it.  At this point the patient was given 7000 units of intravenous heparin.  The distal internal carotid artery was controlled with a Kitzmiller clamp.  The external carotid artery and branches were controlled with Vesseloops.  Peripheral DeBakey clamp was placed on the common carotid artery.  A longitudinal opening was made in the common carotid artery and extended with Potts scissors.  There was a large amount of thrombus and recurrent stenosis in the mid section of a previous patch angioplasty.  There was also some narrowing of the distal internal carotid artery just past this.  I was able to extend the arteriotomy past the area of narrowing.  I then extended the arteriotomy proximally past the area of narrowing as well.  I then brought a 10 [place] shunt up on the operative field and threaded this into the distal internal carotid artery and it was allowed to backbleed thoroughly.  I then tried to advance the shunt into the common carotid artery.  However due to tortuosity I was unable to get the shunt to advance safely down into the proximal common carotid artery so the shunt was abandoned.  Patient's mean arterial pressure was kept elevated during the course of the case to improve cross lateral perfusion.  Next an endarterectomy was begun in a suitable plane and a good proximal and distal endpoints were obtained.  I then used some mom scissors to debride away the old patch.  A new dacryon patch was brought up in the  operative field and sewn on as a patch angioplasty using a running 6-0 Prolene suture.  Despite completion anastomosis it was for blood backbled and thoroughly flushed reanastomosed was secured clamps released flow was first restored from the external carotid artery retrograde in the common  carotid to external carotid artery and then after about 10 cardiac cycles to the right internal carotid artery.  Doppler was used to evaluate the flow and there was good Doppler flow in the internal/external and common carotid arteries.  Hemostasis was obtained with the addition of a few repair sutures 70 mg of protamine and Avitene direct pressure.  10 flat Jackson-Pratt drain was brought out through separate stab incision to the base of the neck.  It was sutured the skin with a nylon stitch.  The platysma muscle was reapproximated using a running 3-0 Vicryl suture.  The skin was closed with a 4-0 Vicryl subcuticular stitch.  Dermabond was applied.  The patient tolerated procedure well and there were no complications.  Patient was taken to recovery room in stable condition.  He was moving all extremities on arrival to the recovery room.  Ruta Hinds, MD [facility] Office: [phone]

## 2019-12-04 NOTE — Consult Note (Signed)
Neurology Consultation Reason for Consult: Unresponsiveness Referring Physician: Rachelle Hora, C  CC: Unresponsiveness  History is obtained from: Nursing  HPI: Kelly Williams is a 79 y.o. male with a history of diabetes, hypertension, hyperlipidemia, who suffered a mild stroke likely due to ICA stenosis last month who underwent redo carotid endarterectomy today.  Following the endarterectomy, he was initially recovering well in postop, but then became unresponsive on the floor.   I evaluated the patient at that time.  On emergent carotid Doppler was obtained which appeared to demonstrate reocclusion of the right carotid and therefore he was taken back to the OR for emergent carotid endarterectomy.  In the OR, it was found that the carotid was open and he was taken for CT angiogram which did not demonstrate any large vessel occlusion, but there is some intraluminal thrombus in the right ICA.   LKW: Unclear tpa given?: no, recent surgery   ROS: A 14 point ROS was performed and is negative except as noted in the HPI.  Past Medical History:  Diagnosis Date  . Alcohol abuse   . Alcohol abuse   . Arthritis   . Carotid arterial disease ([place])   . Chronic kidney disease    CKD-patient denies  . COPD (chronic obstructive pulmonary disease) ([facility])    uses inhalers  . Coronary artery disease   . Diabetes mellitus without complication ([place])   . Diabetic retinopathy ([place])   . Hyperlipidemia   . Hypertension   . Normocytic anemia 01/16/2019  . Stroke [facility])    around 2020, 2 after bypass in 2015     Family History  Problem Relation Age of Onset  . Hypertension Mother   . Hypertension Father      Social History:  reports that he quit smoking about 17 years ago. His smoking use included cigarettes. He has a 10.00 pack-year smoking history. He has never used smokeless tobacco. He reports previous alcohol use. He reports that he does not use drugs.   Exam: Current vital signs: BP (!)  146/72   Pulse 100   Temp 98 F (36.7 C)   Resp 14   Ht 5\' 6"  (1.676 m)   Wt 63.5 kg   SpO2 94%   BMI 22.60 kg/m  Vital signs in last 24 hours: Temp:  [97.5 F (36.4 C)-98 F (36.7 C)] 98 F (36.7 C) (08/03 1400) Pulse Rate:  [93-100] 100 (08/03 1520) Resp:  [13-18] 14 (08/03 1520) BP: (105-184)/(58-72) 146/72 (08/03 1520) SpO2:  [94 %-100 %] 94 % (08/03 1520) Arterial Line BP: (115-169)/(51-62) 169/62 (08/03 1520) Weight:  [63.5 kg] 63.5 kg (08/03 0731)   Physical Exam  Constitutional: Appears well-developed and well-nourished.  Psych: Affect appropriate to situation Eyes: No scleral injection HENT: No OP obstrucion MSK: no joint deformities.  Cardiovascular: Normal rate and regular rhythm.  Respiratory: Effort normal, non-labored breathing GI: Soft.  No distension. There is no tenderness.  Skin: WDI  Neuro: Mental Status: Patient has open eyes, but he does not fixate or track, he appears to follow commands to wiggle toes a single time, but this is not consistent. Cranial Nerves: II: He does not blink to threat from either direction pupils are equal, round, and reactive to light.   III,IV, VI: Doll's eye intact  V: VII: Corneals intact Motor: He does not respond noxious stimulation in any extremity sensory: As above  Cerebellar: Does not perform   I have reviewed labs in epic and the results pertinent to this consultation  are: Glucose at the time of decreased responsiveness-192   I have reviewed the images obtained: CT/CTA-previous right ICA territory strokes, there appears to be a new intraluminal thrombus in the right ICA which is nonocclusive.  Impression: 79 year old male with decreased responsiveness following carotid surgery.  The exam that was seen in the postop period seems out of proportion to carotid occlusion.  I do wonder if he could be having some type of reperfusion syndrome, but certainly with the intraluminal thrombus on the right there is  concern that vascular etiology could be playing a role.  On my most recent assessment, he was still very sedated on high-dose of propofol, but plan is to wean this so we can get a better exam.  Recommendations: 1) wean sedation for better exam 2) MRI brain 3) EEG 4) if MRI is negative for reperfusion syndrome, could consider heparin versus continue antiplatelet therapy for his intraluminal thrombus. 5) stroke team to follow   This patient is critically ill and at significant risk of neurological worsening, death and care requires constant monitoring of vital signs, hemodynamics,respiratory and cardiac monitoring, neurological assessment, discussion with family, other specialists and medical decision making of high complexity. I spent 65 minutes of neurocritical care time  in the care of  this patient. This was time spent independent of any time provided by nurse practitioner or PA.  Roland Rack, MD [facility] [phone]  If 7pm- 7am, please page neurology on call as listed in [place]. 12/21/2019  7:16 PM

## 2019-12-04 NOTE — Transfer of Care (Signed)
Immediate Anesthesia Transfer of Care Note  Patient: Kelly Williams  Procedure(s) Performed: REDO RIGHT CAROTID ENDARTERECTOMY (Right Neck) PATCH ANGIOPLASTY USING HEMASHIELD PLATINUM FINESSE CARDIOVASCULAR PATCH (Right Neck)  Patient Location: PACU  Anesthesia Type:General  Level of Consciousness: drowsy and patient cooperative  Airway & Oxygen Therapy: Patient Spontanous Breathing and Patient connected to face mask oxygen  Post-op Assessment: Report given to RN, Post -op Vital signs reviewed and stable and Patient moving all extremities X 4  Post vital signs: Reviewed and stable  Last Vitals:  Vitals Value Taken Time  BP 154/122 12/21/2019 1351  Temp    Pulse 94 12/05/2019 1358  Resp 20 12/28/2019 1358  SpO2 100 % 12/28/2019 1358  Vitals shown include unvalidated device data.  Last Pain:  Vitals:   12/06/2019 0812  TempSrc:   PainSc: 0-No pain      Patients Stated Pain Goal: 3 (22/56/72 0919)  Complications: No complications documented.

## 2019-12-04 NOTE — Anesthesia Procedure Notes (Signed)
Procedure Name: Intubation Date/Time: 12/11/2019 3:46 PM Performed by: Alain Marion, CRNA Pre-anesthesia Checklist: Patient identified, Emergency Drugs available, Suction available and Patient being monitored Patient Re-evaluated:Patient Re-evaluated prior to induction Oxygen Delivery Method: Circle System Utilized Preoxygenation: Pre-oxygenation with 100% oxygen Induction Type: IV induction Ventilation: Mask ventilation without difficulty Laryngoscope Size: Miller and 3 Grade View: Grade I Tube type: Oral Tube size: 7.5 mm Number of attempts: 1 Airway Equipment and Method: Stylet and Oral airway Placement Confirmation: ETT inserted through vocal cords under direct vision,  positive ETCO2 and breath sounds checked- equal and bilateral Secured at: 22 cm Tube secured with: Tape Dental Injury: Teeth and Oropharynx as per pre-operative assessment

## 2019-12-04 NOTE — Anesthesia Procedure Notes (Signed)
Arterial Line Insertion Start/End08/20/2021 7:45 AM, 12/21/2019 7:49 AM Performed by: Orlie Dakin, CRNA, CRNA  Patient location: Pre-op. Preanesthetic checklist: patient identified, IV checked, risks and benefits discussed, surgical consent, monitors and equipment checked, pre-op evaluation and timeout performed Lidocaine 1% used for infiltration and patient sedated Left, radial was placed Catheter size: 20 G Hand hygiene performed  and maximum sterile barriers used   Attempts: 1 Procedure performed without using ultrasound guided technique. Following insertion, dressing applied and Biopatch. Post procedure assessment: normal  Patient tolerated the procedure well with no immediate complications.

## 2019-12-04 NOTE — Progress Notes (Signed)
Pt arrived via bed to rm 21. Pt NIHSS complete. Pt not answering questions, alert. Not speaking not following commands, not redrawing to pain on bilateral le. Dr. Oneida Alar notified of change.

## 2019-12-04 NOTE — Anesthesia Preprocedure Evaluation (Addendum)
Anesthesia Evaluation  Patient identified by MRN, date of birth, ID band Patient awake    Reviewed: Allergy & Precautions, NPO status , Patient's Chart, lab work & pertinent test results  Airway Mallampati: I  TM Distance: >3 FB Neck ROM: Full    Dental  (+) Teeth Intact, Dental Advisory Given, Missing,    Pulmonary COPD,  COPD inhaler, former smoker,    breath sounds clear to auscultation       Cardiovascular hypertension, Pt. on medications + CAD and + CABG   Rhythm:Regular Rate:Normal     Neuro/Psych PSYCHIATRIC DISORDERS CVA    GI/Hepatic negative GI ROS, Neg liver ROS,   Endo/Other  diabetes, Type 2, Insulin Dependent  Renal/GU Renal InsufficiencyRenal disease     Musculoskeletal  (+) Arthritis ,   Abdominal Normal abdominal exam  (+)   Peds  Hematology negative hematology ROS (+)   Anesthesia Other Findings   Reproductive/Obstetrics                            Anesthesia Physical Anesthesia Plan  ASA: II  Anesthesia Plan: General   Post-op Pain Management:    Induction: Intravenous  PONV Risk Score and Plan: 3 and Ondansetron, Dexamethasone and Midazolam  Airway Management Planned: Oral ETT  Additional Equipment: Arterial line  Intra-op Plan:   Post-operative Plan: Extubation in OR  Informed Consent: I have reviewed the patients History and Physical, chart, labs and discussed the procedure including the risks, benefits and alternatives for the proposed anesthesia with the patient or authorized representative who has indicated his/her understanding and acceptance.     Dental advisory given  Plan Discussed with: CRNA  Anesthesia Plan Comments: (Remi gtt  Echo:  1. Left ventricular ejection fraction, by estimation, is 50 to 55%. The  left ventricle has low normal function. The left ventricle has no regional  wall motion abnormalities. Left ventricular diastolic  parameters are  consistent with Grade I diastolic  dysfunction (impaired relaxation).  2. Right ventricular systolic function is normal. The right ventricular  size is normal.  3. The mitral valve is normal in structure. Mild mitral valve  regurgitation. No evidence of mitral stenosis.  4. The aortic valve is tricuspid. Aortic valve regurgitation is not  visualized. Mild to moderate aortic valve sclerosis/calcification is  present, without any evidence of aortic stenosis.  5. The inferior vena cava is normal in size with greater than 50%  respiratory variability, suggesting right atrial pressure of 3 mmHg. )       Anesthesia Quick Evaluation

## 2019-12-04 NOTE — Progress Notes (Signed)
VASCULAR SURGERY:  This patient underwent redo right carotid endarterectomy earlier this morning with Dr. Oneida Alar.  He had done well in the recovery room but then was transferred to 4 E. and now is not following commands with a clear neurologic change.  I will return him urgently to the operating room for exploration to see if the artery is patent.  I have discussed this with the daughter.  We will proceed urgently.  Deitra Mayo, MD Office: [phone]

## 2019-12-04 NOTE — Progress Notes (Signed)
Rapid at bedside.

## 2019-12-04 NOTE — Progress Notes (Signed)
Carotid duplex has been completed.   Preliminary results in CV Proc.   Abram Sander 12/08/2019 3:45 PM

## 2019-12-04 NOTE — Anesthesia Preprocedure Evaluation (Addendum)
Anesthesia Evaluation  Patient identified by MRN, date of birth, ID band Patient awake    Reviewed: Allergy & Precautions, NPO status , Patient's Chart, lab work & pertinent test results  Airway Mallampati: I  TM Distance: >3 FB Neck ROM: Full    Dental  (+) Teeth Intact, Dental Advisory Given, Missing,    Pulmonary COPD,  COPD inhaler, former smoker,    breath sounds clear to auscultation       Cardiovascular hypertension, Pt. on medications + CAD and + CABG   Rhythm:Regular Rate:Normal     Neuro/Psych PSYCHIATRIC DISORDERS CVA    GI/Hepatic negative GI ROS, Neg liver ROS,   Endo/Other  diabetes, Type 2, Insulin Dependent  Renal/GU Renal InsufficiencyRenal disease     Musculoskeletal  (+) Arthritis ,   Abdominal Normal abdominal exam  (+)   Peds  Hematology negative hematology ROS (+)   Anesthesia Other Findings   Reproductive/Obstetrics                             Anesthesia Physical  Anesthesia Plan  ASA: III and emergent  Anesthesia Plan: General   Post-op Pain Management:    Induction: Intravenous  PONV Risk Score and Plan: 3 and Ondansetron, Dexamethasone and Midazolam  Airway Management Planned: Oral ETT  Additional Equipment: Arterial line  Intra-op Plan:   Post-operative Plan: Post-operative intubation/ventilation  Informed Consent: I have reviewed the patients History and Physical, chart, labs and discussed the procedure including the risks, benefits and alternatives for the proposed anesthesia with the patient or authorized representative who has indicated his/her understanding and acceptance.     Only emergency history available and Dental advisory given  Plan Discussed with: CRNA  Anesthesia Plan Comments: (Remi gtt  Echo:  1. Left ventricular ejection fraction, by estimation, is 50 to 55%. The  left ventricle has low normal function. The left  ventricle has no regional  wall motion abnormalities. Left ventricular diastolic parameters are  consistent with Grade I diastolic  dysfunction (impaired relaxation).  2. Right ventricular systolic function is normal. The right ventricular  size is normal.  3. The mitral valve is normal in structure. Mild mitral valve  regurgitation. No evidence of mitral stenosis.  4. The aortic valve is tricuspid. Aortic valve regurgitation is not  visualized. Mild to moderate aortic valve sclerosis/calcification is  present, without any evidence of aortic stenosis.  5. The inferior vena cava is normal in size with greater than 50%  respiratory variability, suggesting right atrial pressure of 3 mmHg. )       Anesthesia Quick Evaluation

## 2019-12-04 NOTE — Transfer of Care (Signed)
Immediate Anesthesia Transfer of Care Note  Patient: Kelly Williams  Procedure(s) Performed: EXPLORATION OF NECK, PLACEMENT OF 55f BLAKE DRAIN (Right Neck) THROMBECTOMY CAROTID ARTERY (Right )  Patient Location: ICU  Anesthesia Type:General  Level of Consciousness: Patient remains intubated per anesthesia plan  Airway & Oxygen Therapy: Patient remains intubated per anesthesia plan and Patient placed on Ventilator (see vital sign flow sheet for setting)  Post-op Assessment: Report given to RN and Post -op Vital signs reviewed and stable  Post vital signs: Reviewed and stable  Last Vitals:  Vitals Value Taken Time  BP 128/68 12/13/2019 1838  Temp    Pulse 81 12/25/2019 1845  Resp 18 12/09/2019 1844  SpO2 100 % 12/21/2019 1845  Vitals shown include unvalidated device data.  Last Pain:  Vitals:   12/21/2019 1430  TempSrc:   PainSc: 2       Patients Stated Pain Goal: 3 (58/44/17 1278)  Complications: No complications documented.

## 2019-12-04 NOTE — Progress Notes (Signed)
Patient transported from 4N27 to MRI and back with no complications.

## 2019-12-04 NOTE — Progress Notes (Signed)
EEG complete - results pending 

## 2019-12-05 ENCOUNTER — Encounter (HOSPITAL_COMMUNITY): Payer: Self-pay | Admitting: Vascular Surgery

## 2019-12-05 ENCOUNTER — Inpatient Hospital Stay (HOSPITAL_COMMUNITY): Payer: Medicare HMO

## 2019-12-05 DIAGNOSIS — G9340 Encephalopathy, unspecified: Secondary | ICD-10-CM | POA: Diagnosis not present

## 2019-12-05 DIAGNOSIS — R569 Unspecified convulsions: Secondary | ICD-10-CM

## 2019-12-05 DIAGNOSIS — I6523 Occlusion and stenosis of bilateral carotid arteries: Secondary | ICD-10-CM | POA: Diagnosis not present

## 2019-12-05 LAB — POCT I-STAT 7, (LYTES, BLD GAS, ICA,H+H)
Acid-base deficit: 7 mmol/L — ABNORMAL HIGH (ref 0.0–2.0)
Bicarbonate: 17.7 mmol/L — ABNORMAL LOW (ref 20.0–28.0)
Calcium, Ion: 1.15 mmol/L (ref 1.15–1.40)
HCT: 21 % — ABNORMAL LOW (ref 39.0–52.0)
Hemoglobin: 7.1 g/dL — ABNORMAL LOW (ref 13.0–17.0)
O2 Saturation: 99 %
Patient temperature: 98.6
Potassium: 4.4 mmol/L (ref 3.5–5.1)
Sodium: 142 mmol/L (ref 135–145)
TCO2: 19 mmol/L — ABNORMAL LOW (ref 22–32)
pCO2 arterial: 30.8 mmHg — ABNORMAL LOW (ref 32.0–48.0)
pH, Arterial: 7.368 (ref 7.350–7.450)
pO2, Arterial: 156 mmHg — ABNORMAL HIGH (ref 83.0–108.0)

## 2019-12-05 LAB — CBC
HCT: 23.2 % — ABNORMAL LOW (ref 39.0–52.0)
Hemoglobin: 7.4 g/dL — ABNORMAL LOW (ref 13.0–17.0)
MCH: 30.2 pg (ref 26.0–34.0)
MCHC: 31.9 g/dL (ref 30.0–36.0)
MCV: 94.7 fL (ref 80.0–100.0)
Platelets: 241 10*3/uL (ref 150–400)
RBC: 2.45 MIL/uL — ABNORMAL LOW (ref 4.22–5.81)
RDW: 13.7 % (ref 11.5–15.5)
WBC: 11.4 10*3/uL — ABNORMAL HIGH (ref 4.0–10.5)
nRBC: 0 % (ref 0.0–0.2)

## 2019-12-05 LAB — BASIC METABOLIC PANEL
Anion gap: 10 (ref 5–15)
BUN: 27 mg/dL — ABNORMAL HIGH (ref 8–23)
CO2: 17 mmol/L — ABNORMAL LOW (ref 22–32)
Calcium: 7.7 mg/dL — ABNORMAL LOW (ref 8.9–10.3)
Chloride: 113 mmol/L — ABNORMAL HIGH (ref 98–111)
Creatinine, Ser: 1.32 mg/dL — ABNORMAL HIGH (ref 0.61–1.24)
GFR calc Af Amer: 59 mL/min — ABNORMAL LOW (ref >60)
GFR calc non Af Amer: 51 mL/min — ABNORMAL LOW (ref >60)
Glucose, Bld: 236 mg/dL — ABNORMAL HIGH (ref 70–99)
Potassium: 4.2 mmol/L (ref 3.5–5.1)
Sodium: 140 mmol/L (ref 135–145)

## 2019-12-05 LAB — GLUCOSE, CAPILLARY
Glucose-Capillary: 178 mg/dL — ABNORMAL HIGH (ref 70–99)
Glucose-Capillary: 72 mg/dL (ref 70–99)
Glucose-Capillary: 98 mg/dL (ref 70–99)
Glucose-Capillary: 130 mg/dL — ABNORMAL HIGH (ref 70–99)

## 2019-12-05 LAB — PHOSPHORUS: Phosphorus: 4.4 mg/dL (ref 2.5–4.6)

## 2019-12-05 LAB — TRIGLYCERIDES: Triglycerides: 61 mg/dL (ref <150)

## 2019-12-05 LAB — MAGNESIUM: Magnesium: 1.8 mg/dL (ref 1.7–2.4)

## 2019-12-05 MED ORDER — ORAL CARE MOUTH RINSE
15.0000 mL | Freq: Two times a day (BID) | OROMUCOSAL | Status: DC
  Administered 2019-12-05 – 2019-12-09 (×5): 15 mL via OROMUCOSAL

## 2019-12-05 MED ORDER — IPRATROPIUM-ALBUTEROL 0.5-2.5 (3) MG/3ML IN SOLN
3.0000 mL | Freq: Two times a day (BID) | RESPIRATORY_TRACT | Status: DC
  Administered 2019-12-05 – 2019-12-10 (×11): 3 mL via RESPIRATORY_TRACT
  Filled 2019-12-05 (×10): qty 3

## 2019-12-05 MED ORDER — SODIUM CHLORIDE 0.9 % IV SOLN: INTRAVENOUS | Status: DC

## 2019-12-05 NOTE — Progress Notes (Signed)
New onset afib seen on tele. Rate controlled and BP stable. MD notified.

## 2019-12-05 NOTE — Progress Notes (Signed)
NAME:  Kelly Williams, MRN:  [id number], DOB:  1941-02-11, LOS: 1 ADMISSION DATE:  12/18/2019, CONSULTATION DATE:  12/25/2019 REFERRING MD:  Dr. Oneida Alar, CHIEF COMPLAINT:  Vent Management   Brief History   [name] is a 79 y/o M, admitted to [facility] for planned re-do of R CEA after CTA on prior admission revealed bilateral carotid stenosis R>L. He had a change in mental status and STAT duplex showed concern for ICA occlusion. He was taken back to the OR; however, ICA was found to be patent. He was subsequently sent for STAT CTA head which head which did not show any acute event. Returned to the ICU and PCCM was asked to assist with vent management.   Significant Hospital Events   8/3: Admitted to [facility], CEA, PCCM Consult for vent management.  8/4: Self extubated.   Consults:  PCCM Stroke team  Procedures:  8/3: ETT 8/3: R CEA 8/3 Exploration of R CEA and R ICA thrombectomy 8/3: R art line   Significant Diagnostic Tests:  Carotid duplex 8/3 > right ICA occlusion. 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 Persistent narrowing and irregularity of the distal ICA with increased eccentric intraluminal filling defect, which may reflect unstable, migrated clot. 60-70% stenosis of the proximal left ICA. No proximal intracranial vessel occlusion. Stable findings of multifocal intracranial atherosclerosis.  EEG 8/3 >  MRI brain 8/3 > Unchanged appearance of acute R MCA territory infarct. No new site of acute ischemia. Unchanged generalized atrophy and findings of chronic ischemic microangiopathy.   Micro Data:  8/3> MRSA PCR: Negative  Antimicrobials:  none   Interim history/subjective:  [name] was seen and evaluated at bedside this morning. He self extubated this morning, stating that he "had to cough." We spoke about his hospital course. He states that he is "doing well" and  is happy to be at [facility]. All questions and concerns were addressed.   We discussed rounding back on him this afternoon to see if he is stable enough for the floor. He was agreeable to the plan.   Objective   Blood pressure (!) 110/56, pulse 98, temperature 97.8 F (36.6 C), temperature source Axillary, resp. rate 17, height _0  (1.676 m), weight 63.5 kg, SpO2 98 %.    Vent Mode: PRVC FiO2 (%):  [40 %-50 %] 40 % Set Rate:  [18 bmp] 18 bmp Vt Set:  [510 mL] 510 mL PEEP:  [5 cmH20] 5 cmH20 Plateau Pressure:  [15 cmH20-17 cmH20] 17 cmH20   Intake/Output Summary (Last 24 hours) at 12/05/2019 0806 Last data filed at 12/05/2019 0700 Gross per 24 hour  Intake 4279.74 ml  Output 1700 ml  Net 2579.74 ml   Filed Weights   12/10/2019 0731  Weight: 63.5 kg    Examination: Physical Exam Constitutional:      General: He is not in acute distress.    Appearance: Normal appearance. He is not toxic-appearing or diaphoretic.     Comments: Very pleasant gentleman, appears stated age, resting comfortably at bedside.   HENT:     Head: Normocephalic.  Neck:     Comments: R surgical site intact and dressed. No signs of purulence or erythema around the site. Drain draining dark blood.  Cardiovascular:     Rate and Rhythm: Normal rate and regular rhythm.     Pulses: Normal pulses.  Heart sounds: Normal heart sounds. No murmur heard.  No friction rub. No gallop.   Pulmonary:     Effort: Pulmonary effort is normal.     Breath sounds: Normal breath sounds. No wheezing, rhonchi or rales.  Chest:     Chest wall: No tenderness.  Abdominal:     General: Abdomen is flat. Bowel sounds are normal.     Tenderness: There is no abdominal tenderness. There is no guarding.  Neurological:     Mental Status: He is alert and oriented to person, place, and time.     Comments: Able to move all extremities spontaneously.   Psychiatric:        Mood and Affect: Mood normal.        Behavior: Behavior normal.      Resolved Hospital Problem list   Acute Respiratory Failure  Assessment & Plan:  Acute Respiratory Failure: Self Extubated Originally unable to protect airway s/p surgical intervention. Self extubated this morning and able to follow commands and answer questions appropriately. Hx COPD.Saturating at 98-100 % with a RR of 16-18 during interview this morning. Does have a productive cough, but able to handle secretions via self suction. Lungs CTAB on examination.  - Continue to monitor vitals  Bilateral Carotid Artery Stenosis R > L  - S/P R CEA redo on 8/1. His surgical course was complicated by  by change in mental status post op and concern for ICA occlusion; therefore, taken back to OR for re-exploration but found to have no occlusion. - Post op care per vascular.  Reperfusion Syndrome vs Seizures:  Hx CVA (recent acute right frontal and parietal lobes in addition to chronic right MCA territory, b/l occipital, right temporal, left cerebellum). After viewing yesterday's MRI, Neurology states underlying cause may be secondary  - Per neuro. - EEG: pending - Continue home ASA, plavix.  Hypertension:  - Holding home dose losartan - SBP goal 120-140 per neuro / vascular  Hyperlipidemia Coronary Artery Disease: - Continue home ASA, atorvastatin, clopidogrel.  CKD: - Trend BMP.  DM: - SSI. - Hold home trulicity.  Hx EtOH use, peripheral neuropathy: - Thiamine / folate. - Continue home gabapentin.   Best practice:  Diet: NPO. Pain/Anxiety/Delirium protocol (if indicated): None VAP protocol (if indicated): In place. DVT prophylaxis: SCD's / Heparin. GI prophylaxis: PPI. Glucose control: SSI. Mobility: Bedrest. Code Status: Full. Family Communication: Per family. Disposition: ICU, but stable to transfer to the floor  Labs   CBC: Recent Labs  Lab 12/08/2019 0837 12/05/19 0216 12/05/19 0426  WBC 10.5  --  11.4*  HGB 11.5* 7.1* 7.4*  HCT 36.1* 21.0* 23.2*    MCV 95.0  --  94.7  PLT 354  --  833    Basic Metabolic Panel: Recent Labs  Lab 11/29/19 0242 12/09/2019 0837 12/05/19 0216 12/05/19 0426  NA 140 139 142 140  K 4.1 4.4 4.4 4.2  CL 113* 109  --  113*  CO2 19* 21*  --  17*  GLUCOSE 103* 116*  --  236*  BUN 24* 24*  --  27*  CREATININE 1.58* 1.22  --  1.32*  CALCIUM 8.1* 9.0  --  7.7*  MG 1.9  --   --  1.8  PHOS  --   --   --  4.4   GFR: Estimated Creatinine Clearance: 40.8 mL/min (A) (by C-G formula based on SCr of 1.32 mg/dL (H)). Recent Labs  Lab 12/27/2019 0837 12/05/19 0426  WBC 10.5 11.4*  Liver Function Tests: Recent Labs  Lab 12/14/2019 0837  AST 34  ALT 40  ALKPHOS 82  BILITOT 0.7  PROT 7.2  ALBUMIN 3.6   No results for input(s): LIPASE, AMYLASE in the last 168 hours. No results for input(s): AMMONIA in the last 168 hours.  ABG    Component Value Date/Time   PHART 7.368 12/05/2019 0216   PCO2ART 30.8 (L) 12/05/2019 0216   PO2ART 156 (H) 12/05/2019 0216   HCO3 17.7 (L) 12/05/2019 0216   TCO2 19 (L) 12/05/2019 0216   ACIDBASEDEF 7.0 (H) 12/05/2019 0216   O2SAT 99.0 12/05/2019 0216     Coagulation Profile: Recent Labs  Lab 01/01/2020 0837  INR 1.0    Cardiac Enzymes: No results for input(s): CKTOTAL, CKMB, CKMBINDEX, TROPONINI in the last 168 hours.  HbA1C: Hemoglobin A1C  Date/Time Value Ref Range Status  06/14/2019 09:18 AM 5.8 (A) 4.0 - 5.6 % Final   HbA1c, POC (prediabetic range)  Date/Time Value Ref Range Status  01/12/2019 04:58 PM 5.9 5.7 - 6.4 % Final  10/27/2018 01:36 PM 6.0 5.7 - 6.4 % Final   Hgb A1c MFr Bld  Date/Time Value Ref Range Status  11/27/2019 03:13 AM 6.3 (H) 4.8 - 5.6 % Final    Comment:    (NOTE) Pre diabetes:          5.7%-6.4%  Diabetes:              >6.4%  Glycemic control for   <7.0% adults with diabetes     CBG: Recent Labs  Lab 11/29/19 1143 11/29/19 1556 12/25/2019 0731 12/28/2019 1436 12/30/2019 1535  GLUCAP 83 100* 111*  111* 176* 192*     Review of Systems:   Review of Systems  Constitutional: Negative for chills and fever.  Respiratory: Positive for cough. Negative for hemoptysis, sputum production, shortness of breath and wheezing.   Cardiovascular: Negative for chest pain and palpitations.  Gastrointestinal: Negative for abdominal pain, nausea and vomiting.  Neurological: Negative for headaches.    Past Medical History  He,  has a past medical history of Alcohol abuse, Alcohol abuse, Arthritis, Carotid arterial disease ([place]), Chronic kidney disease, COPD (chronic obstructive pulmonary disease) ([place]), Coronary artery disease, Diabetes mellitus without complication ([place]), Diabetic retinopathy ([place]), Hyperlipidemia, Hypertension, Normocytic anemia (01/16/2019), and Stroke ([place]).   Surgical History    Past Surgical History:  Procedure Laterality Date  . APPENDECTOMY     as teenager  . BACK SURGERY    . CAROTID ENDARTERECTOMY Right   . CORONARY ARTERY BYPASS GRAFT     10-12 years ago in [place] at [facility]  . ENDARTERECTOMY Right 12/18/2019   Procedure: EXPLORATION OF NECK, PLACEMENT OF 8fBLAKE DRAIN;  Surgeon: DAngelia Mould MD;  [place] [facility]  Service: Vascular;  Laterality: Right;  . ENDARTERECTOMY Right 12/06/2019   Procedure: REDO RIGHT CAROTID ENDARTERECTOMY;  Surgeon: FElam Dutch MD;  [facility]  Service: Vascular;  Laterality: Right;  . IR PATIENT EVAL TECH 0-60 MINS  11/27/2019  . PATCH ANGIOPLASTY Right 01/01/2020   Procedure: PATCH ANGIOPLASTY USING HEMASHIELD PLATINUM FINESSE CARDIOVASCULAR PATCH;  Surgeon: FElam Dutch MD;  [facility]  Service: Vascular;  Laterality: Right;  . REVERSE SHOULDER ARTHROPLASTY Right 07/18/2019   Procedure: REVERSE SHOULDER ARTHROPLASTY WITH SUBSCAPULAR REPAIR;  Surgeon: VHiram Gash MD;  Location: [facility];  Service: Orthopedics;  Laterality: Right;  . THROMBECTOMY BRACHIAL ARTERY Right 12/28/2019   Procedure: THROMBECTOMY CAROTID  ARTERY;  Surgeon: Angelia Mould, MD;  Location: [facility] OR;  Service: Vascular;  Laterality: Right;  [name] VASECTOMY       Social History   reports that he quit smoking about 17 years ago. His smoking use included cigarettes. He has a 10.00 pack-year smoking history. He has never used smokeless tobacco. He reports previous alcohol use. He reports that he does not use drugs.   Family History   His family history includes Hypertension in his father and mother.   Allergies Allergies  Allergen Reactions  . Metformin And Related Diarrhea     Home Medications  Prior to Admission medications   Medication Sig Start Date End Date Taking? Authorizing Provider  albuterol (PROVENTIL HFA;VENTOLIN HFA) 108 (90 Base) MCG/ACT inhaler Inhale 1-2 puffs into the lungs every 4 (four) hours as needed for wheezing or shortness of breath (bronchospasm). 06/20/18  Yes Trixie Dredge, PA-C  Alcohol Swabs (B-D SINGLE USE SWABS REGULAR) PADS Use to check glucose daily Patient taking differently: 1 each by Other route See admin instructions. Use to check glucose daily 09/07/19  Yes Luetta Nutting, DO  aspirin EC 325 MG EC tablet Take 1 tablet (325 mg total) by mouth daily. 11/30/19  Yes Shelly Coss, MD  atorvastatin (LIPITOR) 80 MG tablet Take 1 tablet (80 mg total) by mouth at bedtime. 11/29/19  Yes Shelly Coss, MD  Blood Glucose Monitoring Suppl (TRUE METRIX AIR GLUCOSE METER) DEVI Dx DM E11.9 Check fasting blood sugar every morning and once 2 hours after largest meal of the day. Patient taking differently: 1 each by Other route See admin instructions. Dx DM E11.9 Check fasting blood sugar every morning and once 2 hours after largest meal of the day. 09/13/19  Yes Luetta Nutting, DO  celecoxib (CELEBREX) 100 MG capsule Take 1 capsule (100 mg total) by mouth 2 (two) times daily as needed for mild pain. Patient taking differently: Take 200 mg by mouth 2 (two) times daily as needed for mild pain.   09/10/19  Yes Luetta Nutting, DO  clopidogrel (PLAVIX) 75 MG tablet Take 1 tablet (75 mg total) by mouth daily. 11/30/19  Yes Adhikari, Tamsen Meek, MD  Dulaglutide (TRULICITY) 4.74 QV/9.5GL SOPN Inject 0.75 mg into the skin once a week. Patient taking differently: Inject 0.75 mg into the skin once a week. Saturday 09/10/19  Yes Luetta Nutting, DO  gabapentin (NEURONTIN) 300 MG capsule Take 1 capsule (300 mg total) by mouth 3 (three) times daily. 09/07/19  Yes Luetta Nutting, DO  glucose blood (TRUE METRIX BLOOD GLUCOSE TEST) test strip Use as instructed Patient taking differently: 1 each by Other route as directed.  09/07/19  Yes Luetta Nutting, DO  losartan (COZAAR) 25 MG tablet Take 1 tablet (25 mg total) by mouth every morning. Patient taking differently: Take 25 mg by mouth at bedtime.  06/25/19  Yes Buford Dresser, MD  TRUEplus Lancets 30G MISC Use to check glucose daily Patient taking differently: 1 each by Other route daily.  09/07/19  Yes Luetta Nutting, DO  acetaminophen (TYLENOL) 500 MG tablet Take 1,000 mg by mouth every 6 (six) hours as needed for mild pain.    [provider]  bismuth subsalicylate (PEPTO BISMOL) 262 MG/15ML suspension Take 30 mLs by mouth every 6 (six) hours as needed for indigestion or diarrhea or loose stools.    [provider]  ipratropium-albuterol (DUONEB) 0.5-2.5 (3) MG/3ML SOLN Take 3 mLs by nebulization every 6 (six) hours as needed. Patient taking differently: Take 3 mLs by  nebulization every 6 (six) hours as needed (COPD).  10/27/18   Trixie Dredge, PA-C  loperamide (IMODIUM) 2 MG capsule Take 1 capsule (2 mg total) by mouth every 8 (eight) hours as needed for diarrhea or loose stools. 11/29/19   Shelly Coss, MD  Respiratory Therapy Supplies (NEBULIZER/TUBING/MOUTHPIECE) KIT Use as directed with nebulizer treatments (Duoneb) every 4 hours prn. Dx: J44.9 Patient taking differently: 1 each by Other route See admin instructions. Use  as directed with nebulizer treatments (Duoneb) every 4 hours prn. Dx: J44.9 06/20/18   Trixie Dredge, PA-C  Tiotropium Bromide-Olodaterol (STIOLTO RESPIMAT) 2.5-2.5 MCG/ACT AERS Inhale 2 puffs into the lungs daily. Patient not taking: Reported on 11/26/2019 10/27/18   Trixie Dredge, PA-C     Critical care time: Maudie Mercury, MD IMTS, PGY-2 Pager: [phone] 12/05/2019,10:49 AM

## 2019-12-05 NOTE — Progress Notes (Signed)
STROKE TEAM PROGRESS NOTE   INTERVAL HISTORY I have personally reviewed history of presenting illness with the patient, electronic medical records and imaging films in PACS.  Patient had elective right carotid endarterectomy yesterday and had neurological worsening postprocedure and carotid ultrasound showed occlusion of the surgical site prompting reexploration and patient seems to have done well after that.  Left-sided weakness is improving.  MRI scan shows stable size of the right MCA branch infarct unchanged from previous MRI 2 weeks ago.  CT angiogram shows narrowing and irregularity of distal ICA with eccentric intraluminal filling defect likely a clot.  Blood pressure has been adequately controlled.  Vitals:   12/05/19 0900 12/05/19 1000 12/05/19 1100 12/05/19 1200  BP: 100/63 (!) 141/59 (!) 107/58 127/73  Pulse: 96 (!) 110 (!) 108 (!) 114  Resp: 18 (!) 22 (!) 24 (!) 24  Temp:    99.5 F (37.5 C)  TempSrc:      SpO2: 99% 98% 96% 96%  Weight:      Height:       CBC:  Recent Labs  Lab 12/03/2019 0837 12/08/2019 0837 12/05/19 0216 12/05/19 0426  WBC 10.5  --   --  11.4*  HGB 11.5*   < > 7.1* 7.4*  HCT 36.1*   < > 21.0* 23.2*  MCV 95.0  --   --  94.7  PLT 354  --   --  241   < > = values in this interval not displayed.   Basic Metabolic Panel:  Recent Labs  Lab 11/29/19 0242 11/29/19 0242 12/17/2019 0837 12/26/2019 0837 12/05/19 0216 12/05/19 0426  NA 140   < > 139   < > 142 140  K 4.1   < > 4.4   < > 4.4 4.2  CL 113*   < > 109  --   --  113*  CO2 19*   < > 21*  --   --  17*  GLUCOSE 103*   < > 116*  --   --  236*  BUN 24*   < > 24*  --   --  27*  CREATININE 1.58*   < > 1.22  --   --  1.32*  CALCIUM 8.1*   < > 9.0  --   --  7.7*  MG 1.9  --   --   --   --  1.8  PHOS  --   --   --   --   --  4.4   < > = values in this interval not displayed.   Last lipids Lab Results  Component Value Date   CHOL 131 11/27/2019   HDL 40 (L) 11/27/2019   LDLCALC 76 11/27/2019   TRIG  61 12/05/2019   CHOLHDL 3.3 11/27/2019   Lab Results  Component Value Date   HGBA1C 6.3 (H) 11/27/2019    IMAGING past 24 hours MR BRAIN WO CONTRAST  Result Date: 12/31/2019 CLINICAL DATA:  Stroke follow-up EXAM: MRI HEAD WITHOUT CONTRAST TECHNIQUE: Multiplanar, multiecho pulse sequences of the brain and surrounding structures were obtained without intravenous contrast. COMPARISON:  Brain MRI 11/26/2019 FINDINGS: Brain: Unchanged appearance acute infarct within the right. No new site of acute ischemia. Early confluent hyperintense T2-weighted signal of the periventricular and deep white matter, most commonly due to chronic ischemic microangiopathy. Multiple old infarcts, unchanged. There is generalized atrophy without lobar predilection. Multifocal chronic microhemorrhage. Normal midline structures. Vascular: Normal flow voids. Skull and upper cervical spine: Normal marrow signal.  Sinuses/Orbits: Negative. Other: None. IMPRESSION: 1. Unchanged appearance of acute right MCA territory infarct. No new site of acute ischemia. 2. Unchanged generalized atrophy and findings of chronic ischemic microangiopathy. Electronically Signed   By: Ulyses Jarred M.D.   On: 12/19/2019 22:52   Portable Chest x-ray  Result Date: 12/03/2019 CLINICAL DATA:  Endotracheal tube placement EXAM: PORTABLE CHEST 1 VIEW COMPARISON:  08/24/2019 FINDINGS: The endotracheal tube terminates above the carina by approximately 4.7 cm. There is a tube projecting over the right lung apex which is felt to be external to the patient. The patient is status post prior CABG. Aortic calcifications are noted. The patient is status post total shoulder arthroplasty on the right. There are degenerative changes of the left shoulder. There is no pneumothorax. There is chronic scarring versus atelectasis at the medial right lung base. There is no acute osseous abnormality. IMPRESSION: 1. Endotracheal tube terminates above the carina by approximately 4.7  cm. 2. No acute cardiopulmonary process. Electronically Signed   By: Constance Holster M.D.   On: 12/03/2019 19:11   EEG adult  Result Date: 12/05/2019 Lora Havens, MD     12/05/2019 10:12 AM Patient Name: Kelly Williams MRN: [id number] Epilepsy Attending: Lora Havens Referring Physician/Provider: Dr Roland Rack Date: 12/19/2019 Duration: 25.08 mins Patient history: 79 year old male with decreased responsiveness following carotid surgery. EEG to evaluate for seizure. Level of alertness: comatose AEDs during EEG study: Gabapentin, propofol Technical aspects: This EEG study was done with scalp electrodes positioned according to the 10-20 International system of electrode placement. Electrical activity was acquired at a sampling rate of 500Hz  and reviewed with a high frequency filter of 70Hz  and a low frequency filter of 1Hz . EEG data were recorded continuously and digitally stored. Description:  EEG showed continuous generalized 6-7 Hz theta slowing. Hyperventilation and photic stimulation were not performed.   ABNORMALITY -Continuous slow, generalized IMPRESSION: This study is suggestive of severe diffuse encephalopathy, nonspecific etiology but likely related to sedation. No seizures or epileptiform discharges were seen throughout the recording. Priyanka O Yadav   VAS US CAROTID  Result Date: 12/28/2019 Carotid Arterial Duplex Study Indications:       CVA and post op neurological symptoms. Risk Factors:      Hypertension, hyperlipidemia, Diabetes, prior MI, coronary                    artery disease, prior CVA. Comparison Study:  11/27/19 prev Performing Technologist: Abram Sander RVS  Examination Guidelines: A complete evaluation includes B-mode imaging, spectral Doppler, color Doppler, and power Doppler as needed of all accessible portions of each vessel. Bilateral testing is considered an integral part of a complete examination. Limited examinations for reoccurring indications may be performed  as noted.  Right Carotid Findings: +--------+--------+--------+--------+------------------+--------+         PSV cm/sEDV cm/sStenosisPlaque DescriptionComments +--------+--------+--------+--------+------------------+--------+ ICA Prox                Occluded                           +--------+--------+--------+--------+------------------+--------+   Summary: Right Carotid: Evidence consistent with a total occlusion of the right ICA.  *See table(s) above for measurements and observations.  Electronically signed by Deitra Mayo MD on 12/27/2019 at 8:06:59 PM.   Final    CT ANGIO HEAD CODE STROKE  Result Date: 12/20/2019 CLINICAL DATA:  Post carotid endarterectomy with change in mental status and concern for  ICA occlusion; taken back to OR with ICA open EXAM: CT ANGIOGRAPHY HEAD AND NECK TECHNIQUE: Multidetector CT imaging of the head and neck was performed using the standard protocol during bolus administration of intravenous contrast. Multiplanar CT image reconstructions and MIPs were obtained to evaluate the vascular anatomy. Carotid stenosis measurements (when applicable) are obtained utilizing NASCET criteria, using the distal internal carotid diameter as the denominator. CONTRAST:  65mL OMNIPAQUE IOHEXOL 350 MG/ML SOLN COMPARISON:  11/28/2019 FINDINGS: CT HEAD Brain: There is no acute intracranial hemorrhage, mass effect, or edema. No new loss of gray-white differentiation. Ventricles are stable in size. Vascular: No hyperdense vessel. There is atherosclerotic calcification at the skull base. Skull: Calvarium is unremarkable. Sinuses/Orbits: No acute finding. Other: None. Review of the MIP images confirms the above findings CTA NECK Aortic arch: Great vessel origins remain patent. Right carotid system: Post endarterectomy. Common carotid is patent with atherosclerotic wall thickening and irregularity. Internal and external carotid arteries are patent. There is decreased irregularity of the  proximal cervical ICA. 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 As before, there is narrowing and irregularity of the distal cervical ICA. However, there is an increased eccentric intraluminal filling defect (series 5, image 150). Left carotid system: Patent. Stable calcified and noncalcified plaque along the proximal ICA causing 60-70% stenosis as reported previously. Vertebral arteries: Patent. Right vertebral artery is dominant. There is multifocal calcified plaque with multifocal moderate stenoses of the V1 segment. Skeleton: No new findings. Other neck: Postoperative changes in the neck.  A drain is present. Upper chest: Endotracheal tube in place. Review of the MIP images confirms the above findings CTA HEAD Anterior circulation: Intracranial internal carotid arteries are patent. Calcified plaque is again noted with bilateral moderate stenosis and superimposed severe stenosis on the right adjacent to the clinoid. Middle and anterior cerebral arteries are patent. There is noncalcified plaque narrowing the distal left M1 MCA. Unchanged stenosis at the right ICA origin. Posterior circulation: Intracranial vertebral arteries are patent. Left vertebral artery terminates as a PICA. Calcified plaque along the right vertebral artery causes severe stenosis. Basilar artery is patent. Posterior cerebral arteries are patent. Again noted is atherosclerotic irregularity of the right P2 PCA with several moderate and severe stenoses. Left posterior communicating artery is present and is the primary supply of the left PCA. Venous sinuses: Patent as allowed by contrast bolus timing. Review of the MIP images confirms the above findings IMPRESSION: No acute intracranial abnormality. Post repeat right carotid endarterectomy. Decreased irregularity of the proximal cervical ICA. 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 Persistent narrowing and irregularity of the distal ICA with increased eccentric intraluminal filling defect, which may reflect unstable/migrated clot. 60-70% stenosis of the proximal left ICA. No proximal intracranial vessel occlusion. Stable findings of multifocal intracranial atherosclerosis. Study was reviewed in person at the time of study completion on 12/29/2019 with provider Dr. Leonel Ramsay Electronically Signed   By: Macy Mis M.D.   On: 12/20/2019 19:19   CT ANGIO NECK CODE STROKE  Result Date: 12/11/2019 CLINICAL DATA:  Post carotid endarterectomy with change in mental status and concern for ICA occlusion; taken back to OR with ICA open EXAM: CT ANGIOGRAPHY HEAD AND NECK TECHNIQUE: Multidetector CT imaging of the head and neck was performed using the standard protocol during bolus administration of intravenous contrast. Multiplanar CT image reconstructions and MIPs were obtained to  evaluate the vascular anatomy. Carotid stenosis measurements (when applicable) are obtained utilizing NASCET criteria, using the distal internal carotid diameter as the denominator. CONTRAST:  73mL OMNIPAQUE IOHEXOL 350 MG/ML SOLN COMPARISON:  11/28/2019 FINDINGS: CT HEAD Brain: There is no acute intracranial hemorrhage, mass effect, or edema. No new loss of gray-white differentiation. Ventricles are stable in size. Vascular: No hyperdense vessel. There is atherosclerotic calcification at the skull base. Skull: Calvarium is unremarkable. Sinuses/Orbits: No acute finding. Other: None. Review of the MIP images confirms the above findings CTA NECK Aortic arch: Great vessel origins remain patent. Right carotid system: Post endarterectomy. Common carotid is patent with atherosclerotic wall thickening and irregularity. Internal and external carotid arteries are patent. There is decreased irregularity of the proximal cervical ICA. 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 As before, there is narrowing and irregularity of the distal cervical ICA. However, there is an increased eccentric intraluminal filling defect (series 5, image 150). Left carotid system: Patent. Stable calcified and noncalcified plaque along the proximal ICA causing 60-70% stenosis as reported previously. Vertebral arteries: Patent. Right vertebral artery is dominant. There is multifocal calcified plaque with multifocal moderate stenoses of the V1 segment. Skeleton: No new findings. Other neck: Postoperative changes in the neck.  A drain is present. Upper chest: Endotracheal tube in place. Review of the MIP images confirms the above findings CTA HEAD Anterior circulation: Intracranial internal carotid arteries are patent. Calcified plaque is again noted with bilateral moderate stenosis and superimposed severe stenosis on the right adjacent to the clinoid. Middle and anterior cerebral arteries are patent. There is noncalcified plaque narrowing the distal left M1 MCA. Unchanged stenosis at the right ICA origin. Posterior circulation: Intracranial vertebral arteries are patent. Left vertebral artery terminates as a PICA. Calcified plaque along the right vertebral artery causes severe stenosis. Basilar artery is patent. Posterior cerebral arteries are patent. Again noted is atherosclerotic irregularity of the right P2 PCA with several moderate and severe stenoses. Left posterior communicating artery is present and is the primary supply of the left PCA. Venous sinuses: Patent as allowed by contrast bolus timing. Review of the MIP images confirms the above findings IMPRESSION: No acute intracranial abnormality. Post repeat right carotid endarterectomy. Decreased irregularity of the proximal cervical ICA. 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 Persistent narrowing and irregularity of the distal  ICA with increased eccentric intraluminal filling defect, which may reflect unstable/migrated clot. 60-70% stenosis of the proximal left ICA. No proximal intracranial vessel occlusion. Stable findings of multifocal intracranial atherosclerosis. Study was reviewed in person at the time of study completion on 12/14/2019 with provider Dr. Leonel Ramsay Electronically Signed   By: Macy Mis M.D.   On: 12/23/2019 19:19    PHYSICAL EXAM Pleasant elderly Caucasian male not in distress.  Right carotid endarterectomy surgical incision with surgical drain is noted. . Afebrile. Head is nontraumatic. Neck is supple without bruit.    Cardiac exam no murmur or gallop. Lungs are clear to auscultation. Distal pulses are well felt. Neurological Exam :  Patient is awake alert oriented to time place and person.  Speech and language appear normal.  No dysarthria.  Extraocular movements are full range without nystagmus.  Blinks to threat bilaterally.  Mild left lower facial asymmetry.  Tongue midline motor system exam shows mild left hemiparesis 4/5 strength with subtle drift.  Weakness of left grip intrinsic  hand muscles.  Fine finger movements are diminished on the left.  Orbits right over left upper extremity.  Sensation appears intact bilaterally.  Plantars are downgoing.  Gait not tested. ASSESSMENT/PLAN Mr. DOYNE MICKE is a 79 y.o. male with history of diabetes, hypertension, hyperlipidemia, who suffered a mild stroke likely due to ICA stenosis last month who underwent redo carotid endarterectomy 8/3.  Following the endarterectomy, he was initially recovering well in postop, but then became unresponsive on the floor. Carotid doppler demonstrated R ICA reocclusion, however found to be open in the OR. CTA did not show LVO, but some R ICA thrombus.   Recent R MCA small infarcts due to R ICA stenosis s/p R CEA yesterday w/ neuro changes postop W/O post op reocclusion, no new stroke  Carotid Doppler  R ICA c/w total  occlusion   CTA head & neck no acute abnormality. S/p repeat R CEA w/ decreased irregularity of cervical ICA. Persisted narrowing distal ICA w/ increased filling defect, ? Clot. L ICA 60-70% stenosis.  MRI  Unchanged R MCA infarct. No new infarct.   LDL 76  HgbA1c 6.3  aspirin 325 mg daily and clopidogrel 75 mg daily prior to admission, now on aspirin 81 mg daily and clopidogrel 75 mg daily.   D/c w/ HH therapies last admission   No need to repeat stroke workup  Carotid disease, bilateral   Hx R CEA 10 years ago and L CEA 7 years ago  S/p right CEA re-do 8/3  VVS following left ICA stenosis  Atrial Fibrillation, new onset this hospitalization  Last INR 1.0  CHA2DS2-VASc Score = 7  No AC give post OP status   Hypertension  Stable . Long-term BP goal 130-150  Hyperlipidemia  Home meds:  lipitor 80, resumed in hospital  LDL 76, goal < 70  Continue statin at discharge  Diabetes type II Controlled  HgbA1c 6.3, goal < 7.0  Other Stroke Risk Factors  Advanced age  Former Cigarette smoker, quit 17 yrs ago  Chronic ETOH abuse,   Hx stroke/TIA  7/22021 - R MCA small infarcts, likely due to R ICA stenosis s/p CEA in past. For R CEA re do  Coronary artery disease w/ hx CABG x 5   Chronic systolic cardiomyopathy  Other Active Problems  Hx COPD  CKD stage III  Peripheral neuropathy  GERD  Hospital day # 1  Patient presented for elective right carotid surgery for symptomatic right carotid stenosis and underwent postop neurological worsening due to reocclusion of the surgical site prompting urgent reexploration and revascularization.  CT angiogram shows small nonocclusive intraluminal thrombus in distal cervical/petrous portion which I agree should be managed medically with dual antiplatelet therapy.  He seems to be doing better and improving.  Recommend aspirin and Plavix if so okay from vascular surgery standpoint not increasing bleeding risk.  We will  address anticoagulation issue at the time of discharge and will hold it for now due to postop status no need to repeat stroke work-up since patient had it few weeks ago.  Mobilize out of bed therapy consults.  Greater than 50% time during this 35-minute visit was spent in counseling and coordination of care and discussion with care team. Antony Contras, MD To contact Stroke Continuity provider, please refer to [email or web address] After hours, contact General Neurology

## 2019-12-05 NOTE — Progress Notes (Signed)
Reviewed MRI brain - unchanged size of R MCA infarcts from 7/26. Upon weaning sedation, patient is following commands and moving both upper extremities against gravity. Suspect patient had cerebral hyperperfusion as BP has reached up to 093 systolic, currently BP around 818 systolic.   Regarding non-occlusive intraluminal thrombus, would hold of starting heparin drip tonight. No new embolic events on MRI compared to previous MRI Brain. Suspect this is likely due to ruptured atherosclerotic plaque. Will defer to stroke team tomorrow.

## 2019-12-05 NOTE — Evaluation (Signed)
Clinical/Bedside Swallow Evaluation Patient Details  Name: Kelly Williams MRN: [id number] Date of Birth: Dec 03, 1940  Today's Date: 12/05/2019 Time: SLP Start Time (ACUTE ONLY): 5284 SLP Stop Time (ACUTE ONLY): 1551 SLP Time Calculation (min) (ACUTE ONLY): 20 min  Past Medical History:  Past Medical History:  Diagnosis Date  . Alcohol abuse   . Alcohol abuse   . Arthritis   . Carotid arterial disease ([place])   . Chronic kidney disease    CKD-patient denies  . COPD (chronic obstructive pulmonary disease) (HCC)    uses inhalers  . Coronary artery disease   . Diabetes mellitus without complication ([place])   . Diabetic retinopathy ([place])   . Hyperlipidemia   . Hypertension   . Normocytic anemia 01/16/2019  . Stroke [facility])    around 2020, 2 after bypass in 2015   Past Surgical History:  Past Surgical History:  Procedure Laterality Date  . APPENDECTOMY     as teenager  . BACK SURGERY    . CAROTID ENDARTERECTOMY Right   . CORONARY ARTERY BYPASS GRAFT     10-12 years ago in [place] at [facility]  . ENDARTERECTOMY Right 12/14/2019   Procedure: EXPLORATION OF NECK, PLACEMENT OF 14f BLAKE DRAIN;  Surgeon: Angelia Mould, MD;  Location: [place];  Service: Vascular;  Laterality: Right;  . ENDARTERECTOMY Right 12/31/2019   Procedure: REDO RIGHT CAROTID ENDARTERECTOMY;  Surgeon: Elam Dutch, MD;  [facility];  Service: Vascular;  Laterality: Right;  . IR PATIENT EVAL TECH 0-60 MINS  11/27/2019  . PATCH ANGIOPLASTY Right 12/02/2019   Procedure: PATCH ANGIOPLASTY USING HEMASHIELD PLATINUM FINESSE CARDIOVASCULAR PATCH;  Surgeon: Elam Dutch, MD;  Location: [facility] OR;  Service: Vascular;  Laterality: Right;  . REVERSE SHOULDER ARTHROPLASTY Right 07/18/2019   Procedure: REVERSE SHOULDER ARTHROPLASTY WITH SUBSCAPULAR REPAIR;  Surgeon: Hiram Gash, MD;  Location: [facility];  Service: Orthopedics;  Laterality: Right;  . THROMBECTOMY BRACHIAL ARTERY Right 12/02/2019   Procedure:  THROMBECTOMY CAROTID ARTERY;  Surgeon: Angelia Mould, MD;  Location: [place];  Service: Vascular;  Laterality: Right;  [name] VASECTOMY     HPI:  Pt is a 79 yo male admitted to [facility] for planned re-do of R CEA after CTA on prior admission revealed bilateral carotid stenosis R>L. He had a change in mental status and STAT duplex showed concern for ICA occlusion. He was taken back to the OR; however, ICA was found to be patent. He was subsequently sent for STAT CTA head which head which did not show any acute event. MRI showed R MCA infarct unchanged compared to 7/26. SLE 7/27 recommend SLP f/u for speech and memory. He was intubated 8/3; self-extubated 8/4. PMH: CVA, HTN, HLD, DM, CAD, COPD, CKD, arthritis, alcohol abuse   Assessment / Plan / Recommendation Clinical Impression  Pt has significant generalized facial and lingual weakness, as well as rough and low vocal quality. Mild wet vocal quality at baseline is concerning for reduced secretion management, and as ice chip trials progress there is a subjective increase in audible wetness. Pt also has facial grimacing, multiple swallows, and comments that he "is getting choked." SLP cued pt to cough and to cough with increased effort, but he could not expectorate anything into his oral cavity. Mod cues were also provided for him to use his yankauer to try to facilitate secretion management. His cough does not appear to be strong enough to eject anything at the moment though, and he shares that  he has been coughing more on thin liquids since CVA last week. Recommend that he remain NPO with SLP follow up. Will also return for completion of speech/language evaluation, as pt's daughter says his speech and cognition appear to be worse compared to recent admission.   SLP Visit Diagnosis: Dysphagia, oropharyngeal phase (R13.12)    Aspiration Risk  Moderate aspiration risk;Severe aspiration risk    Diet Recommendation NPO   Medication Administration: Via  alternative means    Other  Recommendations Oral Care Recommendations: Oral care QID Other Recommendations: Have oral suction available   Follow up Recommendations  (tba)      Frequency and Duration min 2x/week  2 weeks       Prognosis Prognosis for Safe Diet Advancement: Good      Swallow Study   General HPI: Pt is a 79 yo male admitted to [facility] for planned re-do of R CEA after CTA on prior admission revealed bilateral carotid stenosis R>L. He had a change in mental status and STAT duplex showed concern for ICA occlusion. He was taken back to the OR; however, ICA was found to be patent. He was subsequently sent for STAT CTA head which head which did not show any acute event. MRI showed R MCA infarct unchanged compared to 7/26. SLE 7/27 recommend SLP f/u for speech and memory. He was intubated 8/3; self-extubated 8/4. PMH: CVA, HTN, HLD, DM, CAD, COPD, CKD, arthritis, alcohol abuse Type of Study: Bedside Swallow Evaluation Previous Swallow Assessment: none in chart Diet Prior to this Study: NPO Temperature Spikes Noted: No Respiratory Status: Room air History of Recent Intubation: Yes Length of Intubations (days): 1 days Date extubated: 12/05/19 (self-extubated) Behavior/Cognition: Cooperative;Requires cueing;Alert Oral Cavity Assessment: Within Functional Limits Oral Care Completed by SLP: Yes Oral Cavity - Dentition: Missing dentition Patient Positioning: Upright in bed Baseline Vocal Quality: Low vocal intensity;Other (comment) (rough) Volitional Cough: Weak;Wet;Congested Volitional Swallow: Able to elicit    Oral/Motor/Sensory Function Overall Oral Motor/Sensory Function: Generalized oral weakness   Ice Chips Ice chips: Impaired Presentation: Spoon Pharyngeal Phase Impairments: Wet Vocal Quality;Throat Clearing - Delayed   Thin Liquid Thin Liquid: Not tested    Nectar Thick Nectar Thick Liquid: Not tested   Honey Thick Honey Thick Liquid: Not tested   Puree Puree: Not  tested   Solid     Solid: Not tested      Osie Bond., M.A. [place] [facility] Pager [phone] Office [phone]  12/05/2019,4:05 PM

## 2019-12-05 NOTE — Procedures (Signed)
Patient Name: VEDANSH KERSTETTER  MRN: [id number]  Epilepsy Attending: Lora Havens  Referring Physician/Provider: Dr Roland Rack Date: 12/26/2019 Duration: 25.08 mins  Patient history: 79 year old male with decreased responsiveness following carotid surgery. EEG to evaluate for seizure.   Level of alertness: comatose  AEDs during EEG study: Gabapentin, propofol  Technical aspects: This EEG study was done with scalp electrodes positioned according to the 10-20 International system of electrode placement. Electrical activity was acquired at a sampling rate of 500Hz  and reviewed with a high frequency filter of 70Hz  and a low frequency filter of 1Hz . EEG data were recorded continuously and digitally stored.   Description:  EEG showed continuous generalized 6-7 Hz theta slowing. Hyperventilation and photic stimulation were not performed.     ABNORMALITY -Continuous slow, generalized  IMPRESSION: This study is suggestive of severe diffuse encephalopathy, nonspecific etiology but likely related to sedation. No seizures or epileptiform discharges were seen throughout the recording.  Thula Stewart Barbra Sarks

## 2019-12-05 NOTE — Progress Notes (Addendum)
[facility] [place]  Subjective  - pt feels fine, self extubated a few minutes ago "had to cough"   Objective (!) 103/58 93 97.8 F (36.6 C) (Axillary) 17 100%  Intake/Output Summary (Last 24 hours) at 12/05/2019 0657 Last data filed at 12/05/2019 0600 Gross per 24 hour  Intake 4204.69 ml  Output 1170 ml  Net 3034.69 ml   UE LE motor 5/5 speech at baseline slur maybe slightly improved A and O x person place situation  Assessment/Planning: There is an irregular spot in the distal ICA adjacent to the petrous portion which was apparent on preop CTA and this is still present and has changed appearance slightly.  Not sure of clinical relevence of this.  Will await neuro recs but I would favor not exploring this further in light of clinical improvement and restart his dual antiplatelet therapy today.  Acute blood loss anemia asymptomatic transfuse if hemoglobin less than 7 otherwise.  Recheck CBC in am.  S/p redo right CEA with some type of post op neurologic event that now seems to be resolving  Keep drain today, ambulate later today Potentially home tomorrow if continues to improve  Advance diet  Appreciate neuro assistance  Daughter updated by phone this morning  Ruta Hinds 12/05/2019 6:57 AM --  Laboratory Lab Results: Recent Labs    12/13/2019 0837 12/13/2019 0837 12/05/19 0216 12/05/19 0426  WBC 10.5  --   --  11.4*  HGB 11.5*   < > 7.1* 7.4*  HCT 36.1*   < > 21.0* 23.2*  PLT 354  --   --  241   < > = values in this interval not displayed.   BMET Recent Labs    12/23/2019 0837 12/08/2019 0837 12/05/19 0216 12/05/19 0426  NA 139   < > 142 140  K 4.4   < > 4.4 4.2  CL 109  --   --  113*  CO2 21*  --   --  17*  GLUCOSE 116*  --   --  236*  BUN 24*  --   --  27*  CREATININE 1.22  --   --  1.32*  CALCIUM 9.0  --   --  7.7*   < > = values in this interval not displayed.    COAG Lab Results  Component Value Date   INR 1.0  12/13/2019   INR 1.0 11/26/2019   No results found for: PTT

## 2019-12-05 NOTE — Progress Notes (Signed)
Self extubation 0645; patient sat 100% RA

## 2019-12-06 ENCOUNTER — Inpatient Hospital Stay (HOSPITAL_COMMUNITY): Payer: Medicare HMO

## 2019-12-06 DIAGNOSIS — J9601 Acute respiratory failure with hypoxia: Secondary | ICD-10-CM

## 2019-12-06 DIAGNOSIS — I6523 Occlusion and stenosis of bilateral carotid arteries: Secondary | ICD-10-CM

## 2019-12-06 DIAGNOSIS — N1832 Chronic kidney disease, stage 3b: Secondary | ICD-10-CM

## 2019-12-06 LAB — BASIC METABOLIC PANEL
Anion gap: 17 — ABNORMAL HIGH (ref 5–15)
BUN: 28 mg/dL — ABNORMAL HIGH (ref 8–23)
CO2: 13 mmol/L — ABNORMAL LOW (ref 22–32)
Calcium: 7.8 mg/dL — ABNORMAL LOW (ref 8.9–10.3)
Chloride: 113 mmol/L — ABNORMAL HIGH (ref 98–111)
Creatinine, Ser: 1.4 mg/dL — ABNORMAL HIGH (ref 0.61–1.24)
GFR calc Af Amer: 55 mL/min — ABNORMAL LOW (ref >60)
GFR calc non Af Amer: 47 mL/min — ABNORMAL LOW (ref >60)
Glucose, Bld: 209 mg/dL — ABNORMAL HIGH (ref 70–99)
Potassium: 4.5 mmol/L (ref 3.5–5.1)
Sodium: 143 mmol/L (ref 135–145)

## 2019-12-06 LAB — POCT I-STAT 7, (LYTES, BLD GAS, ICA,H+H)
Acid-base deficit: 8 mmol/L — ABNORMAL HIGH (ref 0.0–2.0)
Bicarbonate: 16.8 mmol/L — ABNORMAL LOW (ref 20.0–28.0)
Calcium, Ion: 1.16 mmol/L (ref 1.15–1.40)
HCT: 26 % — ABNORMAL LOW (ref 39.0–52.0)
Hemoglobin: 8.8 g/dL — ABNORMAL LOW (ref 13.0–17.0)
O2 Saturation: 100 %
Patient temperature: 98
Potassium: 4.2 mmol/L (ref 3.5–5.1)
Sodium: 143 mmol/L (ref 135–145)
TCO2: 18 mmol/L — ABNORMAL LOW (ref 22–32)
pCO2 arterial: 30.3 mmHg — ABNORMAL LOW (ref 32.0–48.0)
pH, Arterial: 7.35 (ref 7.350–7.450)
pO2, Arterial: 377 mmHg — ABNORMAL HIGH (ref 83.0–108.0)

## 2019-12-06 LAB — GLUCOSE, CAPILLARY
Glucose-Capillary: 182 mg/dL — ABNORMAL HIGH (ref 70–99)
Glucose-Capillary: 128 mg/dL — ABNORMAL HIGH (ref 70–99)
Glucose-Capillary: 115 mg/dL — ABNORMAL HIGH (ref 70–99)
Glucose-Capillary: 244 mg/dL — ABNORMAL HIGH (ref 70–99)

## 2019-12-06 LAB — CBC
HCT: 25.6 % — ABNORMAL LOW (ref 39.0–52.0)
Hemoglobin: 8.4 g/dL — ABNORMAL LOW (ref 13.0–17.0)
MCH: 31.1 pg (ref 26.0–34.0)
MCHC: 32.8 g/dL (ref 30.0–36.0)
MCV: 94.8 fL (ref 80.0–100.0)
Platelets: 262 10*3/uL (ref 150–400)
RBC: 2.7 MIL/uL — ABNORMAL LOW (ref 4.22–5.81)
RDW: 14.2 % (ref 11.5–15.5)
WBC: 13.8 10*3/uL — ABNORMAL HIGH (ref 4.0–10.5)
nRBC: 0 % (ref 0.0–0.2)

## 2019-12-06 LAB — BETA-HYDROXYBUTYRIC ACID: Beta-Hydroxybutyric Acid: 0.68 mmol/L — ABNORMAL HIGH (ref 0.05–0.27)

## 2019-12-06 LAB — LACTIC ACID, PLASMA: Lactic Acid, Venous: 2.7 mmol/L (ref 0.5–1.9)

## 2019-12-06 LAB — BRAIN NATRIURETIC PEPTIDE: B Natriuretic Peptide: 1241.1 pg/mL — ABNORMAL HIGH (ref 0.0–100.0)

## 2019-12-06 LAB — PHOSPHORUS
Phosphorus: 3.4 mg/dL (ref 2.5–4.6)
Phosphorus: 2.7 mg/dL (ref 2.5–4.6)

## 2019-12-06 LAB — MAGNESIUM
Magnesium: 2 mg/dL (ref 1.7–2.4)
Magnesium: 1.8 mg/dL (ref 1.7–2.4)

## 2019-12-06 MED ORDER — SODIUM CHLORIDE 0.9 % IV SOLN
INTRAVENOUS | Status: DC | PRN
  Administered 2019-12-06: 1000 mL via INTRAVENOUS
  Administered 2019-12-07 – 2019-12-11 (×2): 500 mL via INTRAVENOUS

## 2019-12-06 MED ORDER — ETOMIDATE 2 MG/ML IV SOLN
INTRAVENOUS | Status: AC
  Filled 2019-12-06: qty 20

## 2019-12-06 MED ORDER — PANTOPRAZOLE SODIUM 40 MG PO PACK
40.0000 mg | PACK | Freq: Every day | ORAL | Status: DC
  Administered 2019-12-06 – 2020-01-03 (×28): 40 mg
  Filled 2019-12-06 (×30): qty 20

## 2019-12-06 MED ORDER — FENTANYL CITRATE (PF) 100 MCG/2ML IJ SOLN
50.0000 ug | Freq: Once | INTRAMUSCULAR | Status: AC
  Administered 2019-12-06: 50 ug via INTRAVENOUS

## 2019-12-06 MED ORDER — ETOMIDATE 2 MG/ML IV SOLN
20.0000 mg | Freq: Once | INTRAVENOUS | Status: AC
  Administered 2019-12-06: 20 mg via INTRAVENOUS

## 2019-12-06 MED ORDER — ACETAMINOPHEN 325 MG PO TABS
325.0000 mg | ORAL_TABLET | ORAL | Status: DC | PRN
  Administered 2019-12-06 – 2020-01-03 (×10): 650 mg
  Filled 2019-12-06 (×9): qty 2

## 2019-12-06 MED ORDER — INSULIN ASPART 100 UNIT/ML ~~LOC~~ SOLN: 0.0000 [IU] | Freq: Three times a day (TID) | SUBCUTANEOUS | Status: DC

## 2019-12-06 MED ORDER — LOSARTAN POTASSIUM 50 MG PO TABS
25.0000 mg | ORAL_TABLET | Freq: Every day | ORAL | Status: DC
  Administered 2019-12-06 – 2019-12-07 (×2): 25 mg
  Filled 2019-12-06 (×2): qty 1

## 2019-12-06 MED ORDER — VITAL AF 1.2 CAL PO LIQD
1000.0000 mL | ORAL | Status: DC
  Administered 2019-12-06 – 2019-12-07 (×2): 1000 mL

## 2019-12-06 MED ORDER — ACETAMINOPHEN 325 MG RE SUPP
325.0000 mg | RECTAL | Status: DC | PRN
  Filled 2019-12-06: qty 2

## 2019-12-06 MED ORDER — ATORVASTATIN CALCIUM 80 MG PO TABS
80.0000 mg | ORAL_TABLET | Freq: Every day | ORAL | Status: DC
  Administered 2019-12-06 – 2020-01-03 (×29): 80 mg
  Filled 2019-12-06 (×29): qty 1

## 2019-12-06 MED ORDER — MIDAZOLAM HCL 2 MG/2ML IJ SOLN
1.0000 mg | Freq: Once | INTRAMUSCULAR | Status: AC
  Administered 2019-12-06: 1 mg via INTRAVENOUS

## 2019-12-06 MED ORDER — CHLORHEXIDINE GLUCONATE 0.12% ORAL RINSE (MEDLINE KIT)
15.0000 mL | Freq: Two times a day (BID) | OROMUCOSAL | Status: DC
  Administered 2019-12-06 – 2019-12-11 (×12): 15 mL via OROMUCOSAL

## 2019-12-06 MED ORDER — FUROSEMIDE 10 MG/ML IJ SOLN
20.0000 mg | Freq: Once | INTRAMUSCULAR | Status: AC
  Administered 2019-12-06: 20 mg via INTRAVENOUS
  Filled 2019-12-06: qty 2

## 2019-12-06 MED ORDER — INSULIN ASPART 100 UNIT/ML ~~LOC~~ SOLN
0.0000 [IU] | Freq: Four times a day (QID) | SUBCUTANEOUS | Status: DC
  Administered 2019-12-06: 2 [IU] via SUBCUTANEOUS

## 2019-12-06 MED ORDER — ASPIRIN 81 MG PO CHEW
81.0000 mg | CHEWABLE_TABLET | Freq: Every day | ORAL | Status: DC
  Administered 2019-12-07 – 2019-12-17 (×11): 81 mg
  Filled 2019-12-06 (×11): qty 1

## 2019-12-06 MED ORDER — ROCURONIUM BROMIDE 50 MG/5ML IV SOLN
70.0000 mg | Freq: Once | INTRAVENOUS | Status: AC
  Filled 2019-12-06: qty 7

## 2019-12-06 MED ORDER — SODIUM CHLORIDE 0.9 % IV SOLN
3.0000 g | Freq: Four times a day (QID) | INTRAVENOUS | Status: DC
  Administered 2019-12-06 – 2019-12-13 (×29): 3 g via INTRAVENOUS
  Filled 2019-12-06 (×5): qty 8
  Filled 2019-12-06: qty 3
  Filled 2019-12-06 (×27): qty 8

## 2019-12-06 MED ORDER — CLOPIDOGREL BISULFATE 75 MG PO TABS
75.0000 mg | ORAL_TABLET | Freq: Every day | ORAL | Status: DC
  Administered 2019-12-07 – 2019-12-17 (×11): 75 mg
  Filled 2019-12-06 (×11): qty 1

## 2019-12-06 MED ORDER — ADULT MULTIVITAMIN W/MINERALS CH
1.0000 | ORAL_TABLET | Freq: Every day | ORAL | Status: DC
  Administered 2019-12-06 – 2020-01-04 (×28): 1
  Filled 2019-12-06 (×28): qty 1

## 2019-12-06 MED ORDER — ORAL CARE MOUTH RINSE
15.0000 mL | OROMUCOSAL | Status: DC
  Administered 2019-12-06 – 2019-12-08 (×21): 15 mL via OROMUCOSAL

## 2019-12-06 MED ORDER — INSULIN ASPART 100 UNIT/ML ~~LOC~~ SOLN
0.0000 [IU] | SUBCUTANEOUS | Status: DC
  Administered 2019-12-06 – 2019-12-07 (×3): 5 [IU] via SUBCUTANEOUS
  Administered 2019-12-07 (×2): 3 [IU] via SUBCUTANEOUS
  Administered 2019-12-07 (×2): 5 [IU] via SUBCUTANEOUS
  Administered 2019-12-08 (×2): 3 [IU] via SUBCUTANEOUS
  Administered 2019-12-08: 5 [IU] via SUBCUTANEOUS
  Administered 2019-12-08: 3 [IU] via SUBCUTANEOUS
  Administered 2019-12-08: 5 [IU] via SUBCUTANEOUS
  Administered 2019-12-09: 2 [IU] via SUBCUTANEOUS
  Administered 2019-12-09: 5 [IU] via SUBCUTANEOUS
  Administered 2019-12-09 (×2): 3 [IU] via SUBCUTANEOUS
  Administered 2019-12-09: 2 [IU] via SUBCUTANEOUS
  Administered 2019-12-09: 3 [IU] via SUBCUTANEOUS
  Administered 2019-12-10: 2 [IU] via SUBCUTANEOUS
  Administered 2019-12-10 (×2): 3 [IU] via SUBCUTANEOUS
  Administered 2019-12-10: 5 [IU] via SUBCUTANEOUS
  Administered 2019-12-10 – 2019-12-11 (×2): 2 [IU] via SUBCUTANEOUS
  Administered 2019-12-11: 5 [IU] via SUBCUTANEOUS
  Administered 2019-12-11: 3 [IU] via SUBCUTANEOUS
  Administered 2019-12-11: 5 [IU] via SUBCUTANEOUS
  Administered 2019-12-11: 3 [IU] via SUBCUTANEOUS
  Administered 2019-12-11 – 2019-12-12 (×2): 8 [IU] via SUBCUTANEOUS
  Administered 2019-12-12 (×2): 2 [IU] via SUBCUTANEOUS
  Administered 2019-12-12: 11 [IU] via SUBCUTANEOUS
  Administered 2019-12-13: 2 [IU] via SUBCUTANEOUS
  Administered 2019-12-13: 3 [IU] via SUBCUTANEOUS
  Administered 2019-12-13: 8 [IU] via SUBCUTANEOUS
  Administered 2019-12-13: 5 [IU] via SUBCUTANEOUS
  Administered 2019-12-14 (×2): 2 [IU] via SUBCUTANEOUS
  Administered 2019-12-14 (×2): 3 [IU] via SUBCUTANEOUS
  Administered 2019-12-14: 2 [IU] via SUBCUTANEOUS
  Administered 2019-12-15: 5 [IU] via SUBCUTANEOUS
  Administered 2019-12-15 (×2): 3 [IU] via SUBCUTANEOUS
  Administered 2019-12-15: 2 [IU] via SUBCUTANEOUS
  Administered 2019-12-16: 5 [IU] via SUBCUTANEOUS
  Administered 2019-12-16 (×2): 3 [IU] via SUBCUTANEOUS
  Administered 2019-12-16: 11 [IU] via SUBCUTANEOUS
  Administered 2019-12-17: 3 [IU] via SUBCUTANEOUS
  Administered 2019-12-18 (×2): 2 [IU] via SUBCUTANEOUS
  Administered 2019-12-18: 3 [IU] via SUBCUTANEOUS
  Administered 2019-12-18: 5 [IU] via SUBCUTANEOUS
  Administered 2019-12-18: 2 [IU] via SUBCUTANEOUS
  Administered 2019-12-19: 3 [IU] via SUBCUTANEOUS
  Administered 2019-12-19: 2 [IU] via SUBCUTANEOUS
  Administered 2019-12-19: 5 [IU] via SUBCUTANEOUS
  Administered 2019-12-19 – 2019-12-20 (×4): 2 [IU] via SUBCUTANEOUS
  Administered 2019-12-20: 3 [IU] via SUBCUTANEOUS
  Administered 2019-12-21: 2 [IU] via SUBCUTANEOUS
  Administered 2019-12-21: 5 [IU] via SUBCUTANEOUS
  Administered 2019-12-21 (×3): 3 [IU] via SUBCUTANEOUS
  Administered 2019-12-22: 2 [IU] via SUBCUTANEOUS
  Administered 2019-12-22: 3 [IU] via SUBCUTANEOUS
  Administered 2019-12-22: 5 [IU] via SUBCUTANEOUS
  Administered 2019-12-22 (×2): 3 [IU] via SUBCUTANEOUS
  Administered 2019-12-22: 2 [IU] via SUBCUTANEOUS
  Administered 2019-12-22: 3 [IU] via SUBCUTANEOUS
  Administered 2019-12-23 (×2): 5 [IU] via SUBCUTANEOUS
  Administered 2019-12-23: 3 [IU] via SUBCUTANEOUS
  Administered 2019-12-23 (×3): 5 [IU] via SUBCUTANEOUS
  Administered 2019-12-25 – 2019-12-26 (×4): 2 [IU] via SUBCUTANEOUS
  Administered 2019-12-26: 5 [IU] via SUBCUTANEOUS
  Administered 2019-12-26: 2 [IU] via SUBCUTANEOUS
  Administered 2019-12-27: 5 [IU] via SUBCUTANEOUS
  Administered 2019-12-27 (×2): 3 [IU] via SUBCUTANEOUS
  Administered 2019-12-27: 8 [IU] via SUBCUTANEOUS
  Administered 2019-12-28: 2 [IU] via SUBCUTANEOUS
  Administered 2019-12-28 – 2019-12-29 (×5): 3 [IU] via SUBCUTANEOUS
  Administered 2019-12-29: 11 [IU] via SUBCUTANEOUS
  Administered 2019-12-29 – 2019-12-30 (×3): 3 [IU] via SUBCUTANEOUS
  Administered 2019-12-30: 5 [IU] via SUBCUTANEOUS
  Administered 2019-12-30 – 2019-12-31 (×2): 3 [IU] via SUBCUTANEOUS
  Administered 2019-12-31: 2 [IU] via SUBCUTANEOUS
  Administered 2019-12-31: 3 [IU] via SUBCUTANEOUS
  Administered 2019-12-31: 2 [IU] via SUBCUTANEOUS
  Administered 2020-01-01 (×3): 3 [IU] via SUBCUTANEOUS
  Administered 2020-01-01: 2 [IU] via SUBCUTANEOUS
  Administered 2020-01-01 – 2020-01-02 (×2): 3 [IU] via SUBCUTANEOUS
  Administered 2020-01-02: 5 [IU] via SUBCUTANEOUS
  Administered 2020-01-02: 3 [IU] via SUBCUTANEOUS
  Administered 2020-01-02: 5 [IU] via SUBCUTANEOUS
  Administered 2020-01-02 (×2): 3 [IU] via SUBCUTANEOUS
  Administered 2020-01-03: 15 [IU] via SUBCUTANEOUS
  Administered 2020-01-03: 3 [IU] via SUBCUTANEOUS
  Administered 2020-01-03: 15 [IU] via SUBCUTANEOUS
  Administered 2020-01-03: 3 [IU] via SUBCUTANEOUS
  Administered 2020-01-03 (×2): 8 [IU] via SUBCUTANEOUS

## 2019-12-06 MED ORDER — ROCURONIUM BROMIDE 10 MG/ML (PF) SYRINGE
PREFILLED_SYRINGE | INTRAVENOUS | Status: AC
  Administered 2019-12-06: 70 mg
  Filled 2019-12-06: qty 10

## 2019-12-06 MED ORDER — GABAPENTIN 300 MG PO CAPS
300.0000 mg | ORAL_CAPSULE | Freq: Three times a day (TID) | ORAL | Status: DC
  Administered 2019-12-06 – 2019-12-13 (×21): 300 mg
  Filled 2019-12-06 (×21): qty 1

## 2019-12-06 MED ORDER — FENTANYL CITRATE (PF) 100 MCG/2ML IJ SOLN
INTRAMUSCULAR | Status: AC
  Filled 2019-12-06: qty 2

## 2019-12-06 MED ORDER — MIDAZOLAM HCL 2 MG/2ML IJ SOLN
INTRAMUSCULAR | Status: AC
  Filled 2019-12-06: qty 4

## 2019-12-06 MED FILL — Thrombin (Recombinant) For Soln 20000 Unit: CUTANEOUS | Qty: 1 | Status: AC

## 2019-12-06 NOTE — Progress Notes (Addendum)
Progress Note    12/06/2019 7:33 AM 2 Days Post-Op  Subjective:  Intubated with sedation after drop in oxygen saturation this am   Tm 100.1 now afebrile HR 90's-120's NSR 330'Q-762'U systolic 63% 335% FiO2  Gtts:  Propofol   Vitals:   12/06/19 0500 12/06/19 0600  BP: (!) 143/74 (!) 152/81  Pulse: (!) 102 (!) 103  Resp: 18 18  Temp:    SpO2: 100% 99%    Physical Exam: Cardiac:  regular Lungs:  Intubated this am after oxygen desaturation Incisions:  Clean and dry with some ecchymosis Extremities:  Grips bilaterally but will not follow command to let go.  When asked to wiggle toes, he appears to try.  No plantar flexion; left foot cooler than right -bilateral DP palpable with right>left.  Easily palpable left femoral pulse. Neuro:  Does not open eyes to commands  CBC    Component Value Date/Time   WBC 11.4 (H) 12/05/2019 0426   RBC 2.45 (L) 12/05/2019 0426   HGB 8.8 (L) 12/06/2019 0548   HCT 26.0 (L) 12/06/2019 0548   PLT 241 12/05/2019 0426   MCV 94.7 12/05/2019 0426   MCH 30.2 12/05/2019 0426   MCHC 31.9 12/05/2019 0426   RDW 13.7 12/05/2019 0426   LYMPHSABS 3.4 11/26/2019 0004   MONOABS 0.9 11/26/2019 0004   EOSABS 0.3 11/26/2019 0004   BASOSABS 0.1 11/26/2019 0004    BMET    Component Value Date/Time   NA 143 12/06/2019 0548   K 4.2 12/06/2019 0548   CL 113 (H) 12/05/2019 0426   CO2 17 (L) 12/05/2019 0426   GLUCOSE 236 (H) 12/05/2019 0426   BUN 27 (H) 12/05/2019 0426   CREATININE 1.32 (H) 12/05/2019 0426   CREATININE 1.34 (H) 08/24/2019 1114   CALCIUM 7.7 (L) 12/05/2019 0426   GFRNONAA 51 (L) 12/05/2019 0426   GFRNONAA 50 (L) 08/24/2019 1114   GFRAA 59 (L) 12/05/2019 0426   GFRAA 58 (L) 08/24/2019 1114    INR    Component Value Date/Time   INR 1.0 12/17/2019 0837     Intake/Output Summary (Last 24 hours) at 12/06/2019 0733 Last data filed at 12/06/2019 0600 Gross per 24 hour  Intake 1724.87 ml  Output 1075 ml  Net 649.87 ml      Assessment:  79 y.o. male is s/p:  Redo right CEA  2 Days Post-Op  Plan: -unable to determine pt's neuro status-he does grip hands bilaterally, but does not follow commands to let get.  Will need to wean propofol to further assess. -RN reports pt was getting worn out throughout the day yesterday.  Pt had some desaturation around 0100 but appears to have rebounded and then around 0400 when he was tachypneic and O2 sat at 59%.  Pt failed swallow study yesterday.  After speaking with intesivist, he felt that pt  May have aspirated as there is new RLL infiltration.  He states that per pt's daughter,  pt was having difficulty swallowing prior to surgery and events may have exacerbated this. He plans to diurese pt today as he is up 3L and possibly extubated later today if he looks really good, but most likely plan for tomorrow.  He is also going to start IV abx.   -left foot is cooler than right -there is a palpable DP pulse bilaterally with R>L.      Leontine Locket, PA-C [facility] [phone] 12/06/2019 7:33 AM  reintubated this morning Probable pneumonia, minimal vent settings Hemodynamically stable  HR 100s difficult to know if afib noisy baseline on monitor Neuro seems to follow commands but sedated  Start enteral nutrition today. NG in good position on xray this am D/c JP drain Repeat EKG Will consider heparin if still afib.  Will need to make sure Neuro ok with this. His risk of hematoma from OR should be fairly low at this point Needs foley catheter to monitor urine output now that he is on vent and plans for diuresis  Daughter updated by phone  Ruta Hinds, MD [facility] [place] Office: [phone]

## 2019-12-06 NOTE — Progress Notes (Signed)
PT Cancellation Note  Patient Details Name: Kelly Williams MRN: [id number] DOB: [birth year]   Cancelled Treatment:    Reason Eval/Treat Not Completed: Medical issues which prohibited therapy. Pt re-intubated this morning. PT will follow up when the pt is more stable and more alert.   Zenaida Niece 12/06/2019, 11:49 AM

## 2019-12-06 NOTE — Progress Notes (Signed)
eLink Physician-Brief Progress Note Patient Name: VANDELL KUN DOB: 31-Mar-1941 MRN: [id number]   Date of Service  12/06/2019  HPI/Events of Note  Around 20 min ago, I was notified of sharp desaturation . Camera assessment - patient on NRB mask, O2 sat in 60s, then slowly up to 84 but severe tachypnea  eICU Interventions  Bedside PCCM notified and immediately at bedside to intubate      Intervention Category Major Interventions: Hypoxemia - evaluation and management  Ailine Hefferan G Keerstin Bjelland 12/06/2019, 4:09 AM

## 2019-12-06 NOTE — Progress Notes (Signed)
OT Cancellation Note  Patient Details Name: Kelly Williams MRN: [id number] DOB: April 05, 1941   Cancelled Treatment:    Reason Eval/Treat Not Completed: Medical issues which prohibited therapy.  Pt reintubated this am.  [name]., OTR/L Acute Rehabilitation Services Pager [phone] Office [phone]   [name] M 12/06/2019, 11:08 AM

## 2019-12-06 NOTE — Progress Notes (Signed)
STROKE TEAM PROGRESS NOTE   INTERVAL HISTORY Patient developed respiratory distress early this morning with hypoxia and required emergent intubation.  Is currently sedated and paralyzed in the exam is limited.  Vital signs are stable.  Vitals:   12/06/19 1200 12/06/19 1300 12/06/19 1400 12/06/19 1500  BP: 125/73 (!) 106/57 114/63 (!) 109/57  Pulse: (!) 112 (!) 108 (!) 106 (!) 106  Resp: 18 18 18 18   Temp: (!) 100.5 F (38.1 C)     TempSrc: Axillary     SpO2: 99% 100% 100% 100%  Weight:      Height:       CBC:  Recent Labs  Lab 12/05/19 0426 12/05/19 0426 12/06/19 0548 12/06/19 0739  WBC 11.4*  --   --  13.8*  HGB 7.4*   < > 8.8* 8.4*  HCT 23.2*   < > 26.0* 25.6*  MCV 94.7  --   --  94.8  PLT 241  --   --  262   < > = values in this interval not displayed.   Basic Metabolic Panel:  Recent Labs  Lab 12/05/19 0426 12/05/19 0426 12/06/19 0548 12/06/19 0739 12/06/19 1003 12/06/19 1156  NA 140   < > 143 143  --   --   K 4.2   < > 4.2 4.5  --   --   CL 113*  --   --  113*  --   --   CO2 17*  --   --  13*  --   --   GLUCOSE 236*  --   --  209*  --   --   BUN 27*  --   --  28*  --   --   CREATININE 1.32*  --   --  1.40*  --   --   CALCIUM 7.7*  --   --  7.8*  --   --   MG 1.8  --   --   --  2.0  --   PHOS 4.4  --   --   --   --  3.4   < > = values in this interval not displayed.   Last lipids Lab Results  Component Value Date   CHOL 131 11/27/2019   HDL 40 (L) 11/27/2019   LDLCALC 76 11/27/2019   TRIG 61 12/05/2019   CHOLHDL 3.3 11/27/2019   Lab Results  Component Value Date   HGBA1C 6.3 (H) 11/27/2019    IMAGING past 24 hours DG CHEST PORT 1 VIEW  Result Date: 12/06/2019 CLINICAL DATA:  Endotracheal tube placement EXAM: PORTABLE CHEST 1 VIEW COMPARISON:  12/31/2019 FINDINGS: Postoperative changes in the mediastinum and right shoulder. Endotracheal tube with tip measuring about 3.2 cm above the carina. Enteric tube tip is in the left upper quadrant  consistent with location in the upper stomach. Heart size and pulmonary vascularity are normal. Shallow inspiration with developing infiltration or atelectasis in the right lung base. Small right pleural effusion. No pneumothorax. IMPRESSION: Appliances appear in satisfactory location. Shallow inspiration with developing infiltration or atelectasis in the right lung base and small right pleural effusion. Electronically Signed   By: Lucienne Capers M.D.   On: 12/06/2019 04:23   DG Abd Portable 1V  Result Date: 12/05/2019 CLINICAL DATA:  NG tube placement EXAM: PORTABLE ABDOMEN - 1 VIEW COMPARISON:  None. FINDINGS: The NG tube is coiled within the distal esophagus. The tip projects over the mid to distal esophagus. The patient is status  post prior median sternotomy. Multiple surgical clips are noted projecting over the level of the GE junction. IMPRESSION: NG tube coiled within the esophagus.  Repositioning is recommended. These results will be called to the ordering clinician or representative by the Radiologist Assistant, and communication documented in the PACS or [employer]. Electronically Signed   By: Constance Holster M.D.   On: 12/05/2019 17:02    PHYSICAL EXAM Pleasant elderly Caucasian male who is sedated and intubated right carotid endarterectomy surgical incision with surgical drain is noted. . Afebrile. Head is nontraumatic. Neck is supple without bruit.    Cardiac exam no murmur or gallop. Lungs are clear to auscultation. Distal pulses are well felt. Neurological Exam :  Patient is sedated and intubated.  Eyes are closed.  Pupils are small reactive.  Doll's eye movements are sluggish.  Does not blink to threat on either side.  Mild left lower facial asymmetry.  Tongue midline motor system exam shows trace withdrawal in all 4 extremities to painful stimuli.  Plantars are downgoing.  Gait not tested. ASSESSMENT/PLAN Kelly Williams is a 79 y.o. male with history of diabetes,  hypertension, hyperlipidemia, who suffered a mild stroke likely due to ICA stenosis last month who underwent redo carotid endarterectomy 8/3.  Following the endarterectomy, he was initially recovering well in postop, but then became unresponsive on the floor. Carotid doppler demonstrated R ICA reocclusion, however found to be open in the OR. CTA did not show LVO, but some R ICA thrombus.   Recent R MCA small infarcts due to R ICA stenosis s/p R CEA yesterday w/ neuro changes postop W/O post op reocclusion, no new stroke  Carotid Doppler  R ICA c/w total occlusion   CTA head & neck no acute abnormality. S/p repeat R CEA w/ decreased irregularity of cervical ICA. Persisted narrowing distal ICA w/ increased filling defect, ? Clot. L ICA 60-70% stenosis.  MRI  Unchanged R MCA infarct. No new infarct.   LDL 76  HgbA1c 6.3  aspirin 325 mg daily and clopidogrel 75 mg daily prior to admission, now on aspirin 81 mg daily and clopidogrel 75 mg daily.   D/c w/ HH therapies last admission   No need to repeat stroke workup  Carotid disease, bilateral   Hx R CEA 10 years ago and L CEA 7 years ago  S/p right CEA re-do 8/3  VVS following left ICA stenosis  Atrial Fibrillation, new onset this hospitalization  Last INR 1.0  CHA2DS2-VASc Score = 7  No AC give post OP status   Hypertension  Stable . Long-term BP goal 130-150  Hyperlipidemia  Home meds:  lipitor 80, resumed in hospital  LDL 76, goal < 70  Continue statin at discharge  Diabetes type II Controlled  HgbA1c 6.3, goal < 7.0  Other Stroke Risk Factors  Advanced age  Former Cigarette smoker, quit 17 yrs ago  Chronic ETOH abuse,   Hx stroke/TIA  7/22021 - R MCA small infarcts, likely due to R ICA stenosis s/p CEA in past. For R CEA re do  Coronary artery disease w/ hx CABG x 5   Chronic systolic cardiomyopathy  Other Active Problems  Hx COPD  CKD stage III  Peripheral neuropathy  GERD  Hospital day #  2  Patient unfortunately developed respiratory distress requiring emergent intubation and is currently sedated and paralyzed and in's neurological exam is limited.  Continue aspirin Plavix and ongoing respiratory management respiratory failure as per CCM.  Stroke team  will follow.  Discussed with Dr. Silas Flood critical care medicine.This patient is critically ill and at significant risk of neurological worsening, death and care requires constant monitoring of vital signs, hemodynamics,respiratory and cardiac monitoring, extensive review of multiple databases, frequent neurological assessment, discussion with family, other specialists and medical decision making of high complexity.I have made any additions or clarifications directly to the above note.This critical care time does not reflect procedure time, or teaching time or supervisory time of PA/NP/Med Resident etc but could involve care discussion time.  I spent 30 minutes of neurocritical care time  in the care of  this patient.     Kelly Contras, MD To contact Stroke Continuity provider, please refer to [email or web address] After hours, contact General Neurology

## 2019-12-06 NOTE — Progress Notes (Signed)
NAME:  Kelly Williams, MRN:  [id number], DOB:  10/05/1940, LOS: 2 ADMISSION DATE:  12/24/2019, CONSULTATION DATE:  12/20/2019 REFERRING MD:  Dr. Oneida Alar, CHIEF COMPLAINT:  Vent Management   Brief History   Kelly Williams is a 79 y/o M, admitted to [facility] for planned re-do of R CEA after CTA on prior admission revealed bilateral carotid stenosis R>L. He had a change in mental status and STAT duplex showed concern for ICA occlusion. He was taken back to the OR; however, ICA was found to be patent. He was subsequently sent for STAT CTA head which head which did not show any acute event. Returned to the ICU and PCCM was asked to assist with vent management.   Significant Hospital Events   8/3: Admitted to [facility], CEA, PCCM Consult for vent management.  8/4: Self extubated.  8/5: Re-intubated O/N  Consults:  PCCM Stroke team  Procedures:  8/3: ETT 8/3: R CEA 8/3 Exploration of R CEA and R ICA thrombectomy 8/3: R art line   Significant Diagnostic Tests:  Carotid duplex 8/3 > right ICA occlusion. 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 Persistent narrowing and irregularity of the distal ICA with increased eccentric intraluminal filling defect, which may reflect unstable, migrated clot. 60-70% stenosis of the proximal left ICA. No proximal intracranial vessel occlusion. Stable findings of multifocal intracranial atherosclerosis.  EEG 8/3 > Severe diffuse encephalopathy, nonspecific etiology but likely related to sedation. NO seizures or epileptiform discharges seen throughout the recording.  MRI brain 8/3 > Unchanged appearance of acute R MCA territory infarct. No new site of acute ischemia. Unchanged generalized atrophy and findings of chronic ischemic microangiopathy.   Micro Data:  8/3> MRSA PCR: Negative 8/5> Sputum culture: Sent  Antimicrobials:  Unasyn 8/5>>  Interim  history/subjective:  [name] was seen and evaluated at bedside this AM. He was currently intubated and on a propofol drip. Unable to participate in the interview.    Objective   Blood pressure 114/69, pulse (!) 101, temperature 98.3 F (36.8 C), temperature source Axillary, resp. rate (!) 28, height 5' 6"  (1.676 m), weight 63.5 kg, SpO2 100 %.    Vent Mode: PRVC FiO2 (%):  [100 %] 100 % Set Rate:  [18 bmp] 18 bmp Vt Set:  [510 mL] 510 mL PEEP:  [5 cmH20] 5 cmH20   Intake/Output Summary (Last 24 hours) at 12/06/2019 0811 Last data filed at 12/06/2019 0800 Gross per 24 hour  Intake 1816.15 ml  Output 1075 ml  Net 741.15 ml   Filed Weights   12/13/2019 0731  Weight: 63.5 kg    Examination: Physical Exam Constitutional:      Appearance: He is not ill-appearing or diaphoretic.     Comments: Intubated, appears stated age.   HENT:     Head: Normocephalic and atraumatic.  Eyes:     Conjunctiva/sclera: Conjunctivae normal.     Pupils: Pupils are equal, round, and reactive to light.  Cardiovascular:     Rate and Rhythm: Regular rhythm. Tachycardia present.     Pulses: Normal pulses.     Heart sounds: Normal heart sounds. No murmur heard.  No friction rub. No gallop.   Pulmonary:     Effort: Pulmonary effort is normal.     Breath sounds: Rhonchi present. No wheezing or rales.     Comments: Rhonchi appreciated in the RLL Abdominal:  General: Abdomen is flat. Bowel sounds are normal.     Palpations: Abdomen is soft.  Neurological:     Comments: Moves upper extremities bilaterally spontaneously.      Resolved Hospital Problem list     Assessment & Plan:  Acute Respiratory Failure: Originally unable to protect airway s/p surgical intervention prior to transfer to the ICU. Self extubated 8/4 saturating well and maintaining airway. 8/5 O/N He had a sharp decline in his saturation SpO2 of 50%, likely secondary to a large aspiration event. He was intubated at 0415. Given  likely large aspiration event, chest xray showing RLL infiltrate with small effusion, mild fever, and rhonchi found on R lung favoring pneumonia over chemical pneumonitis. Will maximize chances of successful extubation by giving lasix and antibiotic.  - Furosemide 20 mg IV - Unasyn 3g IV - Continue to monitor vitals - Monitor I/O  Possible Aspiration Pneumonia:  Patient with sharp desaturation, likely due to aspiration event, overnight requiring intubation. Post intubation xray showing R lower lobe infiltrate/small pleural effusion, mild temperature, rhonchi present on physical examination. Ddx including chemical pneumonitis vs pneumonia.  - Unasyn 3g IV  Bilateral Carotid Artery Stenosis R > L  - S/P R CEA redo on 8/1. His surgical course was complicated by  by change in mental status post op and concern for ICA occlusion; therefore, taken back to OR for re-exploration but found to have no occlusion. - Post op care per vascular.  Atrial Fibrillation:  New this hospitalization likely 2/2 to R CEA. Continues to convert between NSR and a. Fib. NSR this morning at bedside.  - Holding anticoagulation 2/2 to recent procedure.   Hypertension:  - Holding home dose losartan - SBP goal 120-140 per neuro / vascular  Hyperlipidemia Coronary Artery Disease: - DC Plavix   CKD: - Trend BMP.  DM: - SSI. - Hold home trulicity.  Hx EtOH use, peripheral neuropathy: - Thiamine / folate. - Continue home gabapentin.   Best practice:  Diet: NPO. Pain/Anxiety/Delirium protocol (if indicated): None VAP protocol (if indicated): In place. DVT prophylaxis: SCD's / Heparin. GI prophylaxis: PPI. Glucose control: SSI. Mobility: Bedrest. Code Status: Full. Family Communication: Per family. Disposition: ICU  Labs   CBC: Recent Labs  Lab 01/01/2020 0837 12/05/19 0216 12/05/19 0426 12/06/19 0548  WBC 10.5  --  11.4*  --   HGB 11.5* 7.1* 7.4* 8.8*  HCT 36.1* 21.0* 23.2* 26.0*  MCV 95.0   --  94.7  --   PLT 354  --  241  --     Basic Metabolic Panel: Recent Labs  Lab 12/15/2019 0837 12/05/19 0216 12/05/19 0426 12/06/19 0548  NA 139 142 140 143  K 4.4 4.4 4.2 4.2  CL 109  --  113*  --   CO2 21*  --  17*  --   GLUCOSE 116*  --  236*  --   BUN 24*  --  27*  --   CREATININE 1.22  --  1.32*  --   CALCIUM 9.0  --  7.7*  --   MG  --   --  1.8  --   PHOS  --   --  4.4  --    GFR: Estimated Creatinine Clearance: 40.8 mL/min (A) (by C-G formula based on SCr of 1.32 mg/dL (H)). Recent Labs  Lab 12/27/2019 0837 12/05/19 0426  WBC 10.5 11.4*    Liver Function Tests: Recent Labs  Lab 12/08/2019 0837  AST 34  ALT 40  ALKPHOS 82  BILITOT 0.7  PROT 7.2  ALBUMIN 3.6   No results for input(s): LIPASE, AMYLASE in the last 168 hours. No results for input(s): AMMONIA in the last 168 hours.  ABG    Component Value Date/Time   PHART 7.350 12/06/2019 0548   PCO2ART 30.3 (L) 12/06/2019 0548   PO2ART 377 (H) 12/06/2019 0548   HCO3 16.8 (L) 12/06/2019 0548   TCO2 18 (L) 12/06/2019 0548   ACIDBASEDEF 8.0 (H) 12/06/2019 0548   O2SAT 100.0 12/06/2019 0548     Coagulation Profile: Recent Labs  Lab 12/29/2019 0837  INR 1.0    Cardiac Enzymes: No results for input(s): CKTOTAL, CKMB, CKMBINDEX, TROPONINI in the last 168 hours.  HbA1C: Hemoglobin A1C  Date/Time Value Ref Range Status  06/14/2019 09:18 AM 5.8 (A) 4.0 - 5.6 % Final   HbA1c, POC (prediabetic range)  Date/Time Value Ref Range Status  01/12/2019 04:58 PM 5.9 5.7 - 6.4 % Final  10/27/2018 01:36 PM 6.0 5.7 - 6.4 % Final   Hgb A1c MFr Bld  Date/Time Value Ref Range Status  11/27/2019 03:13 AM 6.3 (H) 4.8 - 5.6 % Final    Comment:    (NOTE) Pre diabetes:          5.7%-6.4%  Diabetes:              >6.4%  Glycemic control for   <7.0% adults with diabetes     CBG: Recent Labs  Lab 12/05/19 0840 12/05/19 1146 12/05/19 1603 12/05/19 2155 12/06/19 0801  GLUCAP 178* 72 98 130* 182*    Review  of Systems:   Unable to ascertain as patient is intubated.   Past Medical History  He,  has a past medical history of Alcohol abuse, Alcohol abuse, Arthritis, Carotid arterial disease ([place]), Chronic kidney disease, COPD (chronic obstructive pulmonary disease) ([place]), Coronary artery disease, Diabetes mellitus without complication ([place]), Diabetic retinopathy ([place]), Hyperlipidemia, Hypertension, Normocytic anemia (01/16/2019), and Stroke ([place]).   Surgical History    Past Surgical History:  Procedure Laterality Date   APPENDECTOMY     as teenager   BACK SURGERY     CAROTID ENDARTERECTOMY Right    CORONARY ARTERY BYPASS GRAFT     10-12 years ago in [place] at [place] Right 12/23/2019   Procedure: [place], PLACEMENT OF 4fBLAKE DRAIN;  Surgeon: DAngelia Mould MD;  [facility]  Service: Vascular;  Laterality: Right;   ENDARTERECTOMY Right 12/14/2019   Procedure: REDO RIGHT CAROTID ENDARTERECTOMY;  Surgeon: FElam Dutch MD;  [facility]  Service: Vascular;  Laterality: Right;   IR PATIENT EVAL TECH 0-60 MINS  11/27/2019   PATCH ANGIOPLASTY Right 12/06/2019   Procedure: PATCH ANGIOPLASTY USING [place]  Surgeon: FElam Dutch MD;  [facility]  Service: Vascular;  Laterality: Right;   REVERSE SHOULDER ARTHROPLASTY Right 07/18/2019   Procedure: REVERSE SHOULDER ARTHROPLASTY WITH SUBSCAPULAR REPAIR;  Surgeon: VHiram Gash MD;  Location: [facility];  Service: Orthopedics;  Laterality: Right;   THROMBECTOMY BRACHIAL ARTERY Right 12/03/2019   Procedure: THROMBECTOMY CAROTID ARTERY;  Surgeon: DAngelia Mould MD;  [facility];  Service: Vascular;  Laterality: Right;   VASECTOMY       Social History   reports that he quit smoking about 17 years ago. His smoking use included cigarettes. He has a 10.00 pack-year smoking history. He has never used smokeless tobacco. He reports  previous alcohol  use. He reports that he does not use drugs.   Family History   His family history includes Hypertension in his father and mother.   Allergies Allergies  Allergen Reactions   Metformin And Related Diarrhea     Home Medications  Prior to Admission medications   Medication Sig Start Date End Date Taking? Authorizing Provider  albuterol (PROVENTIL HFA;VENTOLIN HFA) 108 (90 Base) MCG/ACT inhaler Inhale 1-2 puffs into the lungs every 4 (four) hours as needed for wheezing or shortness of breath (bronchospasm). 06/20/18  Yes Trixie Dredge, PA-C  Alcohol Swabs (B-D SINGLE USE SWABS REGULAR) PADS Use to check glucose daily Patient taking differently: 1 each by Other route See admin instructions. Use to check glucose daily 09/07/19  Yes Luetta Nutting, DO  aspirin EC 325 MG EC tablet Take 1 tablet (325 mg total) by mouth daily. 11/30/19  Yes Shelly Coss, MD  atorvastatin (LIPITOR) 80 MG tablet Take 1 tablet (80 mg total) by mouth at bedtime. 11/29/19  Yes Shelly Coss, MD  Blood Glucose Monitoring Suppl (TRUE METRIX AIR GLUCOSE METER) DEVI Dx DM E11.9 Check fasting blood sugar every morning and once 2 hours after largest meal of the day. Patient taking differently: 1 each by Other route See admin instructions. Dx DM E11.9 Check fasting blood sugar every morning and once 2 hours after largest meal of the day. 09/13/19  Yes Luetta Nutting, DO  celecoxib (CELEBREX) 100 MG capsule Take 1 capsule (100 mg total) by mouth 2 (two) times daily as needed for mild pain. Patient taking differently: Take 200 mg by mouth 2 (two) times daily as needed for mild pain.  09/10/19  Yes Luetta Nutting, DO  clopidogrel (PLAVIX) 75 MG tablet Take 1 tablet (75 mg total) by mouth daily. 11/30/19  Yes Adhikari, Tamsen Meek, MD  Dulaglutide (TRULICITY) 6.19 JK/9.3OI SOPN Inject 0.75 mg into the skin once a week. Patient taking differently: Inject 0.75 mg into the skin once a week. Saturday 09/10/19  Yes  Luetta Nutting, DO  gabapentin (NEURONTIN) 300 MG capsule Take 1 capsule (300 mg total) by mouth 3 (three) times daily. 09/07/19  Yes Luetta Nutting, DO  glucose blood (TRUE METRIX BLOOD GLUCOSE TEST) test strip Use as instructed Patient taking differently: 1 each by Other route as directed.  09/07/19  Yes Luetta Nutting, DO  losartan (COZAAR) 25 MG tablet Take 1 tablet (25 mg total) by mouth every morning. Patient taking differently: Take 25 mg by mouth at bedtime.  06/25/19  Yes Buford Dresser, MD  TRUEplus Lancets 30G MISC Use to check glucose daily Patient taking differently: 1 each by Other route daily.  09/07/19  Yes Luetta Nutting, DO  acetaminophen (TYLENOL) 500 MG tablet Take 1,000 mg by mouth every 6 (six) hours as needed for mild pain.    [provider]  bismuth subsalicylate (PEPTO BISMOL) 262 MG/15ML suspension Take 30 mLs by mouth every 6 (six) hours as needed for indigestion or diarrhea or loose stools.    [provider]  ipratropium-albuterol (DUONEB) 0.5-2.5 (3) MG/3ML SOLN Take 3 mLs by nebulization every 6 (six) hours as needed. Patient taking differently: Take 3 mLs by nebulization every 6 (six) hours as needed (COPD).  10/27/18   Trixie Dredge, PA-C  loperamide (IMODIUM) 2 MG capsule Take 1 capsule (2 mg total) by mouth every 8 (eight) hours as needed for diarrhea or loose stools. 11/29/19   Shelly Coss, MD  Respiratory Therapy Supplies (NEBULIZER/TUBING/MOUTHPIECE) KIT Use as directed with nebulizer treatments (  Duoneb) every 4 hours prn. Dx: J44.9 Patient taking differently: 1 each by Other route See admin instructions. Use as directed with nebulizer treatments (Duoneb) every 4 hours prn. Dx: J44.9 06/20/18   Trixie Dredge, PA-C  Tiotropium Bromide-Olodaterol (STIOLTO RESPIMAT) 2.5-2.5 MCG/ACT AERS Inhale 2 puffs into the lungs daily. Patient not taking: Reported on 11/26/2019 10/27/18   Trixie Dredge, PA-C      Critical care time: Maudie Mercury, MD IMTS, PGY-2 Pager: [phone] 12/06/2019,8:11 AM

## 2019-12-06 NOTE — Progress Notes (Signed)
eLink Physician-Brief Progress Note Patient Name: MARKEY DEADY DOB: May 16, 1940 MRN: [id number]   Date of Service  12/06/2019  HPI/Events of Note  Patient is on the ventilator and needs his blood sugar monitoring changed to Q 4 hours.  eICU Interventions  Blood sugar monitoring and SSI insulin coverage changed to Q 4 hours.        Kerry Kass Jahmez Bily 12/06/2019, 11:12 PM

## 2019-12-06 NOTE — Progress Notes (Signed)
PCCM Interval Note  Called to bedside for emergent airway. Improved saturations from 59% to 100% after intubation. Updated daughter on patient's status change. She stated she will be here in the morning at 7.  Rodman Pickle, M.D. [facility] Pulmonary/Critical Care Medicine 12/06/2019 4:17 AM

## 2019-12-06 NOTE — Progress Notes (Signed)
Initial Nutrition Assessment  DOCUMENTATION CODES:   Not applicable  INTERVENTION:   Initiate tube feeding via OG tube: Vital AF 1.2 at 35 ml/h and increase by 10 ml every 8 hours to goal of 65 ml/h (1560 ml per day) MVI with minerals daily   Provides 1872 kcal, 117 gm protein, 1265 ml free water daily   NUTRITION DIAGNOSIS:   Inadequate oral intake related to inability to eat as evidenced by estimated needs.  GOAL:   Patient will meet greater than or equal to 90% of their needs  MONITOR:   TF tolerance, Vent status, Labs  REASON FOR ASSESSMENT:   Consult, Ventilator Enteral/tube feeding initiation and management  ASSESSMENT:   Pt with PMH of DM, HTN, ETOH abuse, COPD, HLD, and CKD admitted for planned redo R carotid endarterectomy then after neuro change s/p stat exploration of R CEA and R ICA thrombectomy with no intervention.    Suspect poor nutrition in pt with ETOH abuse.   8/3 admit 8/4 self-extubated 8/5 re-intubated  Patient is currently intubated on ventilator support MV: 14.2 L/min Temp (24hrs), Avg:99.6 F (37.6 C), Min:98.3 F (36.8 C), Max:100.6 F (38.1 C)  Propofol: 7 ml/hr provides: 184 kcal  Medications reviewed and include: colace, SSI, miralax   Labs reviewed: A1C: 6.3      Diet Order:   Diet Order            Diet NPO time specified  Diet effective now                 EDUCATION NEEDS:   No education needs have been identified at this time  Skin:  Skin Assessment: Reviewed RN Assessment  Last BM:  unknown  Height:   Ht Readings from Last 1 Encounters:  12/12/2019 5\' 6"  (1.676 m)    Weight:   Wt Readings from Last 1 Encounters:  12/02/2019 63.5 kg    Ideal Body Weight:  64.5 kg  BMI:  Body mass index is 22.6 kg/m.  Estimated Nutritional Needs:   Kcal:  1867  Protein:  95-115 grams  Fluid:  > 1.8 L/day  Rhyanna Sorce P., RD, LDN, CNSC See AMiON for contact information

## 2019-12-06 NOTE — Procedures (Signed)
Intubation Procedure Note  Kelly Williams  [id number]  November 21, 1940  Date:12/06/19  Time:4:11 AM   Provider Performing:Finnbar Cedillos Rodman Pickle    Procedure: Intubation (91791)  Indication(s) Respiratory Failure  Consent Unable to obtain consent due to emergent nature of procedure.   Anesthesia Etomidate, Versed, Fentanyl and Rocuronium   Time Out Verified patient identification, verified procedure, site/side was marked, verified correct patient position, special equipment/implants available, medications/allergies/relevant history reviewed, required imaging and test results available.   Sterile Technique Usual hand hygeine, masks, and gloves were used   Procedure Description Patient positioned in bed supine.  Sedation given as noted above.  Patient was intubated with endotracheal tube using Glidescope.  View was Grade 1 full glottis .  Number of attempts was 1.  Colorimetric CO2 detector was consistent with tracheal placement.   Complications/Tolerance None; patient tolerated the procedure well. Chest X-ray is ordered to verify placement.   EBL Minimal   Specimen(s) None  Rodman Pickle, M.D. [facility] Pulmonary/Critical Care Medicine 12/06/2019 4:12 AM

## 2019-12-06 NOTE — Progress Notes (Signed)
SLP Cancellation Note  Patient Details Name: Kelly Williams MRN: [id number] DOB: December 23, 1940   Cancelled treatment:       Reason Eval/Treat Not Completed: Medical issues which prohibited therapy. Pt reintubated this morning. Will f/u as able.   Osie Bond., M.A. [place] [facility] Pager [phone] Office [phone]  12/06/2019, 8:20 AM

## 2019-12-06 NOTE — Progress Notes (Signed)
Responded to referral to support to patient  Whose daughter is an Glass blower/designer.  Provided emotional and spiritual  Support to both patient and daughter at bedside.  Chaplain available as needed.  Jaclynn Major, [place], [facility], Pager [phone]

## 2019-12-07 ENCOUNTER — Inpatient Hospital Stay (HOSPITAL_COMMUNITY): Payer: Medicare HMO

## 2019-12-07 DIAGNOSIS — I6523 Occlusion and stenosis of bilateral carotid arteries: Secondary | ICD-10-CM | POA: Diagnosis not present

## 2019-12-07 DIAGNOSIS — J9601 Acute respiratory failure with hypoxia: Secondary | ICD-10-CM

## 2019-12-07 LAB — CBC
HCT: 21.4 % — ABNORMAL LOW (ref 39.0–52.0)
Hemoglobin: 7 g/dL — ABNORMAL LOW (ref 13.0–17.0)
MCH: 31 pg (ref 26.0–34.0)
MCHC: 32.7 g/dL (ref 30.0–36.0)
MCV: 94.7 fL (ref 80.0–100.0)
Platelets: 229 10*3/uL (ref 150–400)
RBC: 2.26 MIL/uL — ABNORMAL LOW (ref 4.22–5.81)
RDW: 14.1 % (ref 11.5–15.5)
WBC: 13.6 10*3/uL — ABNORMAL HIGH (ref 4.0–10.5)
nRBC: 0 % (ref 0.0–0.2)

## 2019-12-07 LAB — HEMOGLOBIN AND HEMATOCRIT, BLOOD
HCT: 29.1 % — ABNORMAL LOW (ref 39.0–52.0)
Hemoglobin: 9.7 g/dL — ABNORMAL LOW (ref 13.0–17.0)

## 2019-12-07 LAB — BASIC METABOLIC PANEL
Anion gap: 12 (ref 5–15)
BUN: 39 mg/dL — ABNORMAL HIGH (ref 8–23)
CO2: 19 mmol/L — ABNORMAL LOW (ref 22–32)
Calcium: 7.5 mg/dL — ABNORMAL LOW (ref 8.9–10.3)
Chloride: 113 mmol/L — ABNORMAL HIGH (ref 98–111)
Creatinine, Ser: 1.6 mg/dL — ABNORMAL HIGH (ref 0.61–1.24)
GFR calc Af Amer: 47 mL/min — ABNORMAL LOW (ref >60)
GFR calc non Af Amer: 40 mL/min — ABNORMAL LOW (ref >60)
Glucose, Bld: 232 mg/dL — ABNORMAL HIGH (ref 70–99)
Potassium: 3.1 mmol/L — ABNORMAL LOW (ref 3.5–5.1)
Sodium: 144 mmol/L (ref 135–145)

## 2019-12-07 LAB — PREPARE RBC (CROSSMATCH)

## 2019-12-07 LAB — PHOSPHORUS
Phosphorus: 2.1 mg/dL — ABNORMAL LOW (ref 2.5–4.6)
Phosphorus: 4.5 mg/dL (ref 2.5–4.6)

## 2019-12-07 LAB — GLUCOSE, CAPILLARY
Glucose-Capillary: 183 mg/dL — ABNORMAL HIGH (ref 70–99)
Glucose-Capillary: 206 mg/dL — ABNORMAL HIGH (ref 70–99)

## 2019-12-07 LAB — MAGNESIUM
Magnesium: 2 mg/dL (ref 1.7–2.4)
Magnesium: 2.2 mg/dL (ref 1.7–2.4)

## 2019-12-07 MED ORDER — SODIUM CHLORIDE 0.9% IV SOLUTION: Freq: Once | INTRAVENOUS | Status: AC

## 2019-12-07 MED ORDER — POTASSIUM PHOSPHATES 15 MMOLE/5ML IV SOLN
30.0000 mmol | Freq: Once | INTRAVENOUS | Status: AC
  Administered 2019-12-07: 30 mmol via INTRAVENOUS
  Filled 2019-12-07: qty 10

## 2019-12-07 MED ORDER — BETHANECHOL CHLORIDE 10 MG PO TABS: 10.0000 mg | ORAL_TABLET | Freq: Three times a day (TID) | ORAL | Status: DC

## 2019-12-07 MED ORDER — BETHANECHOL CHLORIDE 10 MG PO TABS
10.0000 mg | ORAL_TABLET | Freq: Three times a day (TID) | ORAL | Status: DC
  Administered 2019-12-07 – 2019-12-28 (×61): 10 mg
  Filled 2019-12-07 (×62): qty 1

## 2019-12-07 MED ORDER — POTASSIUM CHLORIDE 20 MEQ/15ML (10%) PO SOLN
20.0000 meq | Freq: Two times a day (BID) | ORAL | Status: DC
  Filled 2019-12-07: qty 15

## 2019-12-07 MED ORDER — POTASSIUM CHLORIDE 20 MEQ/15ML (10%) PO SOLN
20.0000 meq | Freq: Two times a day (BID) | ORAL | Status: AC
  Administered 2019-12-07 (×2): 20 meq
  Filled 2019-12-07: qty 15

## 2019-12-07 NOTE — Progress Notes (Addendum)
Progress Note    12/07/2019 7:21 AM 3 Days Post-Op  Subjective:  Remains intubated; sedated. Minimal propofol due to soft BP.  Discussed course with night and day attendant RNs.  No sustained atrial fib. Tolerating TFs   Vitals:   12/07/19 0600 12/07/19 0700  BP: (!) 99/50 (!) 106/56  Pulse: 94 93  Resp: 18 18  Temp:    SpO2: 100% 100%   Tm 101.2 at MN PEEP 6 FiO2 40% MAP 72 RASS -1  Physical Exam: Cardiac:  RRR Lungs:  CTAB Incisions:  Right neck incision with ecchymosis and minimal edema. Soft, no oozing from drain site. Extremities:  Moves all extremities when stimulated. 2+ palpable DP pulses Neuro: I did not stimulate him beyond removing dressing from drain site. Stimulated cough. Abdomen:  Soft. Rectal tube in place with liquid stool.  CBC    Component Value Date/Time   WBC 13.6 (H) 12/07/2019 0625   RBC 2.26 (L) 12/07/2019 0625   HGB 7.0 (L) 12/07/2019 0625   HCT 21.4 (L) 12/07/2019 0625   PLT 229 12/07/2019 0625   MCV 94.7 12/07/2019 0625   MCH 31.0 12/07/2019 0625   MCHC 32.7 12/07/2019 0625   RDW 14.1 12/07/2019 0625   LYMPHSABS 3.4 11/26/2019 0004   MONOABS 0.9 11/26/2019 0004   EOSABS 0.3 11/26/2019 0004   BASOSABS 0.1 11/26/2019 0004    BMET    Component Value Date/Time   NA 143 12/06/2019 0739   K 4.5 12/06/2019 0739   CL 113 (H) 12/06/2019 0739   CO2 13 (L) 12/06/2019 0739   GLUCOSE 209 (H) 12/06/2019 0739   BUN 28 (H) 12/06/2019 0739   CREATININE 1.40 (H) 12/06/2019 0739   CREATININE 1.34 (H) 08/24/2019 1114   CALCIUM 7.8 (L) 12/06/2019 0739   GFRNONAA 47 (L) 12/06/2019 0739   GFRNONAA 50 (L) 08/24/2019 1114   GFRAA 55 (L) 12/06/2019 0739   GFRAA 58 (L) 08/24/2019 1114     Intake/Output Summary (Last 24 hours) at 12/07/2019 0721 Last data filed at 12/07/2019 0600 Gross per 24 hour  Intake 2657.56 ml  Output 1700 ml  Net 957.56 ml    HOSPITAL MEDICATIONS Scheduled Meds: . aspirin  81 mg Per Tube Daily  . atorvastatin  80 mg  Per Tube QHS  . chlorhexidine gluconate (MEDLINE KIT)  15 mL Mouth Rinse BID  . Chlorhexidine Gluconate Cloth  6 each Topical Q0600  . clopidogrel  75 mg Per Tube Daily  . docusate  100 mg Per Tube BID  . gabapentin  300 mg Per Tube TID  . insulin aspart  0-15 Units Subcutaneous Q4H  . ipratropium-albuterol  3 mL Nebulization BID  . losartan  25 mg Per Tube QHS  . mouth rinse  15 mL Mouth Rinse BID  . mouth rinse  15 mL Mouth Rinse 10 times per day  . multivitamin with minerals  1 tablet Per Tube Daily  . pantoprazole sodium  40 mg Per Tube QHS  . polyethylene glycol  17 g Per Tube Daily   Continuous Infusions: . sodium chloride    . sodium chloride 75 mL/hr at 12/07/19 0600  . ampicillin-sulbactam (UNASYN) IV Stopped (12/07/19 0248)  . dextran 40 in dextrose 5%    . feeding supplement (VITAL AF 1.2 CAL) 1,000 mL (12/07/19 0507)  . magnesium sulfate bolus IVPB    . niCARDipine    . propofol (DIPRIVAN) infusion 15 mcg/kg/min (12/07/19 0600)   PRN Meds:.sodium chloride, sodium chloride, acetaminophen **OR** acetaminophen,  alum & mag hydroxide-simeth, bismuth subsalicylate, fentaNYL (SUBLIMAZE) injection, fentaNYL (SUBLIMAZE) injection, guaiFENesin-dextromethorphan, ipratropium-albuterol, labetalol, loperamide, magnesium sulfate bolus IVPB, metoprolol tartrate, morphine injection, ondansetron, oxyCODONE-acetaminophen, phenol, potassium chloride  Assessment: s/p right CEA re-do with hypoxia post-op requiring intubation. On Unasyn, Plavix and aspirin.  Good UOP. Drop in hgb to 7 from 8.4.   Plan: -Continue treatment plan/supportive care. Defer to CCM regarding tranfusion.  -DVT prophylaxis:  SCDs   SANDRA SETZER, PA-C [facility] [phone] 12/07/2019  7:21 AM   Agree with above.  Some swelling under mandible soft will observe VDRF hopefully extubate today. Moves extremities and follows commands but will have to assess swallow cognition after extubation Acute  blood loss anemia will transfuse today Appreciate Neuro CCM assistance   , MD [facility] [place] Office: [phone]    

## 2019-12-07 NOTE — Procedures (Signed)
Cortrak  Person Inserting Tube:  Rayn Enderson, RD Tube Type:  Cortrak - 43 inches Tube Location:  Right nare Initial Placement:  Stomach Secured by: Bridle Technique Used to Measure Tube Placement:  Documented cm marking at nare/ corner of mouth Cortrak Secured At:  72 cm   X-ray is required, abdominal x-ray has been ordered by the Cortrak team. Please confirm tube placement before using the Cortrak tube.   If the tube becomes dislodged please keep the tube and contact the Cortrak team at [email or web address] ([email or web address]) for replacement.  If after hours and replacement cannot be delayed, place a NG tube and confirm placement with an abdominal x-ray.    Mariana Single RD, LDN Clinical Nutrition Pager listed in [place]

## 2019-12-07 NOTE — Progress Notes (Signed)
SLP Cancellation Note  Patient Details Name: Kelly Williams MRN: [id number] DOB: [birth year]   Cancelled treatment:       Reason Eval/Treat Not Completed: Patient at procedure or test/unavailable   Deryl Ports, [place] 12/07/2019, 12:01 PM

## 2019-12-07 NOTE — Progress Notes (Addendum)
NAME:  Kelly Williams, MRN:  [id number], DOB:  16-Oct-1940, LOS: 3 ADMISSION DATE:  12/12/2019, CONSULTATION DATE:  12/21/2019 REFERRING MD:  Dr. Oneida Alar, CHIEF COMPLAINT:  Vent Management   Brief History   Kelly Williams is a 79 y/o M, admitted to [facility] for planned re-do of R CEA after CTA on prior admission revealed bilateral carotid stenosis R>L. He had a change in mental status and STAT duplex showed concern for ICA occlusion. He was taken back to the OR; however, ICA was found to be patent. He was subsequently sent for STAT CTA head which head which did not show any acute event. Returned to the ICU and PCCM was asked to assist with vent management.   Significant Hospital Events   8/3: Admitted to [facility], CEA, PCCM Consult for vent management.  8/4: Self extubated.  8/5: Re-intubated O/N  Consults:  PCCM Stroke team  Procedures:  8/3: ETT 8/3: R CEA 8/3 Exploration of R CEA and R ICA thrombectomy 8/3: R art line   Significant Diagnostic Tests:  Carotid duplex 8/3 > right ICA occlusion. 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 Persistent narrowing and irregularity of the distal ICA with increased eccentric intraluminal filling defect, which may reflect unstable, migrated clot. 60-70% stenosis of the proximal left ICA. No proximal intracranial vessel occlusion. Stable findings of multifocal intracranial atherosclerosis.  EEG 8/3 > Severe diffuse encephalopathy, nonspecific etiology but likely related to sedation. NO seizures or epileptiform discharges seen throughout the recording.  MRI brain 8/3 > Unchanged appearance of acute R MCA territory infarct. No new site of acute ischemia. Unchanged generalized atrophy and findings of chronic ischemic microangiopathy.   Micro Data:  8/3> MRSA PCR: Negative 8/5> Sputum culture: No organisms seen. Pending culture.  Antimicrobials:    Unasyn 8/5>>  Interim history/subjective:  Kelly Williams was seen and evaluated at bedside this AM. Was able to answer yes or no questions with hand gestures. We spoke about possible extubation today if we continue to decrease propofol drip.   Spoke with neurology outside of patient room after the encounter. Before possible extubation, Neurology plans for Brain MRI.    Objective   Blood pressure (!) 106/56, pulse 93, temperature 100.1 F (37.8 C), temperature source Oral, resp. rate 18, height 5' 6"  (1.676 m), weight 63.5 kg, SpO2 100 %.    Vent Mode: PRVC FiO2 (%):  [40 %] 40 % Set Rate:  [18 bmp] 18 bmp Vt Set:  [510 mL] 510 mL PEEP:  [5 cmH20] 5 cmH20 Pressure Support:  [10 cmH20] 10 cmH20 Plateau Pressure:  [17 cmH20-23 cmH20] 18 cmH20   Intake/Output Summary (Last 24 hours) at 12/07/2019 0738 Last data filed at 12/07/2019 0600 Gross per 24 hour  Intake 2657.56 ml  Output 1700 ml  Net 957.56 ml   Filed Weights   12/26/2019 0731  Weight: 63.5 kg    Examination: Physical Exam Constitutional:      Appearance: He is not ill-appearing.     Comments: More alert than yesterday, able to answer simple questions with hand gestures, nodding/shaking head. Intubated.   Cardiovascular:     Rate and Rhythm: Normal rate and regular rhythm.     Pulses: Normal pulses.     Heart sounds: Normal heart sounds. No murmur heard.  No friction rub. No gallop.   Pulmonary:     Effort: Pulmonary effort is  normal.     Breath sounds: Normal breath sounds. No wheezing, rhonchi or rales.  Abdominal:     General: Abdomen is flat. Bowel sounds are normal.     Tenderness: There is no abdominal tenderness. There is no guarding.     Hernia: No hernia is present.  Skin:    General: Skin is warm and dry.  Neurological:     Mental Status: He is alert.     Comments: Able to answer simple questions, unable to answer orientation questions 2/2 to being intubated. Moves all limbs sporadically.      Resolved  Hospital Problem list     Assessment & Plan:  Acute Respiratory Failure: Originally unable to protect airway s/p surgical intervention prior to transfer to the ICU. Self extubated 8/4 saturating well and maintaining airway. He was re-intubated on 8/5 due to a desaturation event 2/2 to large aspiration event. Post intubation CXR showing RLL infiltrate with small effusion, mild fever, and rhonchi found on R lung favoring pneumonia over chemical pneumonitis. Spoke with Neurology today at bedside,  will hold extubation after brain MRI.  - Possible extubation pending Brain MRI, will need to monitor secretions.  - - 1.7L UOP yesterday  - Unasyn 3g IV - Continue to monitor vitals - Monitor I/O  Aspiration Pneumonia:  Patient with sharp desaturation, likely due to aspiration event on 8/5 requiring intubation. Post intubation xray showing R lower lobe infiltrate/small pleural effusion, mild temperature, rhonchi present on physical examination. Ddx including chemical pneumonitis vs pneumonia.  - Unasyn 3g IV  Bilateral Carotid Artery Stenosis R > L  - S/P R CEA redo on 8/1. His surgical course was complicated by change in mental status post op and concern for ICA occlusion; therefore, taken back to OR for re-exploration but found to have no occlusion. Due to sudden AMS and reintubation, and previous stroke history, neurology plans for brain MRI today.  - MRI Brain - Post op care per vascular.  Refeeding Syndrome:  Patient with history of ETOH abuse, difficulty swallowing food at home, and failed speech eval, with labs showing concern for metabolic acidosis, with increased B-hydroxy and LA, was restarted on tube feeds with newfound hypophosphatemia and hypokalemia. Continue to follow Mg, Phos, BMP, replenish as needed. AG resolved on today's BMP. - Mg: 2.0 - Phos: 2.1  - K: 3.1 - Replenishing Phos and K with Klor and Kphos.  - Trend BMP, Phos, Mg  Atrial Fibrillation:  New this hospitalization  likely 2/2 to R CEA. Continues to convert between NSR and a. Fib. NSR this morning at bedside.  - Holding anticoagulation 2/2 to recent procedure.   Hypertension:  - Holding home dose losartan - SBP goal 120-140 per neuro / vascular  Hyperlipidemia Coronary Artery Disease: - DC Plavix   CKD: - Trend BMP. - Cr increased 1.6 from 1.4, likely from yesterday's lasix. Hold lasix.   DM: - SSI. - Hold home trulicity.  Hx EtOH use, peripheral neuropathy: - Thiamine / folate. - Continue home gabapentin.   Best practice:  Diet: Tube feeds. Pain/Anxiety/Delirium protocol (if indicated): None VAP protocol (if indicated): In place. DVT prophylaxis: SCD's / Heparin. GI prophylaxis: PPI. Glucose control: SSI. Mobility: Bedrest. Code Status: Full. Family Communication: Per family. Disposition: ICU  Labs   CBC: Recent Labs  Lab 12/17/2019 0837 12/08/2019 0837 12/05/19 0216 12/05/19 0426 12/06/19 0548 12/06/19 0739 12/07/19 0625  WBC 10.5  --   --  11.4*  --  13.8* 13.6*  HGB 11.5*   < >  7.1* 7.4* 8.8* 8.4* 7.0*  HCT 36.1*   < > 21.0* 23.2* 26.0* 25.6* 21.4*  MCV 95.0  --   --  94.7  --  94.8 94.7  PLT 354  --   --  241  --  262 229   < > = values in this interval not displayed.    Basic Metabolic Panel: Recent Labs  Lab 12/09/2019 0837 12/05/19 0216 12/05/19 0426 12/06/19 0548 12/06/19 0739 12/06/19 1003 12/06/19 1156 12/06/19 1646  NA 139 142 140 143 143  --   --   --   K 4.4 4.4 4.2 4.2 4.5  --   --   --   CL 109  --  113*  --  113*  --   --   --   CO2 21*  --  17*  --  13*  --   --   --   GLUCOSE 116*  --  236*  --  209*  --   --   --   BUN 24*  --  27*  --  28*  --   --   --   CREATININE 1.22  --  1.32*  --  1.40*  --   --   --   CALCIUM 9.0  --  7.7*  --  7.8*  --   --   --   MG  --   --  1.8  --   --  2.0  --  1.8  PHOS  --   --  4.4  --   --   --  3.4 2.7   GFR: Estimated Creatinine Clearance: 38.4 mL/min (A) (by C-G formula based on SCr of 1.4 mg/dL  (H)). Recent Labs  Lab 12/24/2019 0837 12/05/19 0426 12/06/19 0739 12/06/19 1150 12/07/19 0625  WBC 10.5 11.4* 13.8*  --  13.6*  LATICACIDVEN  --   --   --  2.7*  --     Liver Function Tests: Recent Labs  Lab 12/31/2019 0837  AST 34  ALT 40  ALKPHOS 82  BILITOT 0.7  PROT 7.2  ALBUMIN 3.6   No results for input(s): LIPASE, AMYLASE in the last 168 hours. No results for input(s): AMMONIA in the last 168 hours.  ABG    Component Value Date/Time   PHART 7.350 12/06/2019 0548   PCO2ART 30.3 (L) 12/06/2019 0548   PO2ART 377 (H) 12/06/2019 0548   HCO3 16.8 (L) 12/06/2019 0548   TCO2 18 (L) 12/06/2019 0548   ACIDBASEDEF 8.0 (H) 12/06/2019 0548   O2SAT 100.0 12/06/2019 0548     Coagulation Profile: Recent Labs  Lab 12/10/2019 0837  INR 1.0    Cardiac Enzymes: No results for input(s): CKTOTAL, CKMB, CKMBINDEX, TROPONINI in the last 168 hours.  HbA1C: Hemoglobin A1C  Date/Time Value Ref Range Status  06/14/2019 09:18 AM 5.8 (A) 4.0 - 5.6 % Final   HbA1c, POC (prediabetic range)  Date/Time Value Ref Range Status  01/12/2019 04:58 PM 5.9 5.7 - 6.4 % Final  10/27/2018 01:36 PM 6.0 5.7 - 6.4 % Final   Hgb A1c MFr Bld  Date/Time Value Ref Range Status  11/27/2019 03:13 AM 6.3 (H) 4.8 - 5.6 % Final    Comment:    (NOTE) Pre diabetes:          5.7%-6.4%  Diabetes:              >6.4%  Glycemic control for   <7.0% adults with diabetes  CBG: Recent Labs  Lab 12/05/19 2155 12/06/19 0801 12/06/19 1127 12/06/19 1606 12/06/19 2227  GLUCAP 130* 182* 128* 115* 244*    Review of Systems:   Unable to ascertain as patient is intubated.   Past Medical History  He,  has a past medical history of Alcohol abuse, Alcohol abuse, Arthritis, Carotid arterial disease ([place]), Chronic kidney disease, COPD (chronic obstructive pulmonary disease) ([place]), Coronary artery disease, Diabetes mellitus without complication ([place]), Diabetic retinopathy ([place]), Hyperlipidemia,  Hypertension, Normocytic anemia (01/16/2019), and Stroke ([place]).   Surgical History    Past Surgical History:  Procedure Laterality Date   APPENDECTOMY     as teenager   BACK SURGERY     CAROTID ENDARTERECTOMY Right    CORONARY ARTERY BYPASS GRAFT     10-12 years ago in [place] at [place] Right 12/30/2019   Procedure: [place], PLACEMENT OF 27fBLAKE DRAIN;  Surgeon: DAngelia Mould MD;  [facility];  Service: Vascular;  Laterality: Right;   ENDARTERECTOMY Right 12/25/2019   Procedure: REDO RIGHT CAROTID ENDARTERECTOMY;  Surgeon: FElam Dutch MD;  [facility]  Service: Vascular;  Laterality: Right;   IR PATIENT EVAL TECH 0-60 MINS  11/27/2019   PATCH ANGIOPLASTY Right 12/07/2019   Procedure: PATCH ANGIOPLASTY USING [place]  Surgeon: FElam Dutch MD;  [facility]  Service: Vascular;  Laterality: Right;   REVERSE SHOULDER ARTHROPLASTY Right 07/18/2019   Procedure: REVERSE SHOULDER ARTHROPLASTY WITH SUBSCAPULAR REPAIR;  Surgeon: VHiram Gash MD;  Location: [facility];  Service: Orthopedics;  Laterality: Right;   THROMBECTOMY BRACHIAL ARTERY Right 12/18/2019   Procedure: THROMBECTOMY CAROTID ARTERY;  Surgeon: DAngelia Mould MD;  [facility];  Service: Vascular;  Laterality: Right;   VASECTOMY       Social History   reports that he quit smoking about 17 years ago. His smoking use included cigarettes. He has a 10.00 pack-year smoking history. He has never used smokeless tobacco. He reports previous alcohol use. He reports that he does not use drugs.   Family History   His family history includes Hypertension in his father and mother.   Allergies Allergies  Allergen Reactions   Metformin And Related Diarrhea     Home Medications  Prior to Admission medications   Medication Sig Start Date End Date Taking? Authorizing Provider  albuterol (PROVENTIL HFA;VENTOLIN  HFA) 108 (90 Base) MCG/ACT inhaler Inhale 1-2 puffs into the lungs every 4 (four) hours as needed for wheezing or shortness of breath (bronchospasm). 06/20/18  Yes CTrixie Dredge PA-C  Alcohol Swabs (B-D SINGLE USE SWABS REGULAR) PADS Use to check glucose daily Patient taking differently: 1 each by Other route See admin instructions. Use to check glucose daily 09/07/19  Yes MLuetta Nutting DO  aspirin EC 325 MG EC tablet Take 1 tablet (325 mg total) by mouth daily. 11/30/19  Yes AShelly Coss MD  atorvastatin (LIPITOR) 80 MG tablet Take 1 tablet (80 mg total) by mouth at bedtime. 11/29/19  Yes AShelly Coss MD  Blood Glucose Monitoring Suppl (TRUE METRIX AIR GLUCOSE METER) DEVI Dx DM E11.9 Check fasting blood sugar every morning and once 2 hours after largest meal of the day. Patient taking differently: 1 each by Other route See admin instructions. Dx DM E11.9 Check fasting blood sugar every morning and once 2 hours after largest meal of the day. 09/13/19  Yes MLuetta Nutting DO  celecoxib (CELEBREX) 100 MG capsule  Take 1 capsule (100 mg total) by mouth 2 (two) times daily as needed for mild pain. Patient taking differently: Take 200 mg by mouth 2 (two) times daily as needed for mild pain.  09/10/19  Yes Luetta Nutting, DO  clopidogrel (PLAVIX) 75 MG tablet Take 1 tablet (75 mg total) by mouth daily. 11/30/19  Yes Adhikari, Tamsen Meek, MD  Dulaglutide (TRULICITY) 3.81 WE/9.9BZ SOPN Inject 0.75 mg into the skin once a week. Patient taking differently: Inject 0.75 mg into the skin once a week. Saturday 09/10/19  Yes Luetta Nutting, DO  gabapentin (NEURONTIN) 300 MG capsule Take 1 capsule (300 mg total) by mouth 3 (three) times daily. 09/07/19  Yes Luetta Nutting, DO  glucose blood (TRUE METRIX BLOOD GLUCOSE TEST) test strip Use as instructed Patient taking differently: 1 each by Other route as directed.  09/07/19  Yes Luetta Nutting, DO  losartan (COZAAR) 25 MG tablet Take 1 tablet (25 mg total) by  mouth every morning. Patient taking differently: Take 25 mg by mouth at bedtime.  06/25/19  Yes Buford Dresser, MD  TRUEplus Lancets 30G MISC Use to check glucose daily Patient taking differently: 1 each by Other route daily.  09/07/19  Yes Luetta Nutting, DO  acetaminophen (TYLENOL) 500 MG tablet Take 1,000 mg by mouth every 6 (six) hours as needed for mild pain.    [provider]  bismuth subsalicylate (PEPTO BISMOL) 262 MG/15ML suspension Take 30 mLs by mouth every 6 (six) hours as needed for indigestion or diarrhea or loose stools.    [provider]  ipratropium-albuterol (DUONEB) 0.5-2.5 (3) MG/3ML SOLN Take 3 mLs by nebulization every 6 (six) hours as needed. Patient taking differently: Take 3 mLs by nebulization every 6 (six) hours as needed (COPD).  10/27/18   Trixie Dredge, PA-C  loperamide (IMODIUM) 2 MG capsule Take 1 capsule (2 mg total) by mouth every 8 (eight) hours as needed for diarrhea or loose stools. 11/29/19   Shelly Coss, MD  Respiratory Therapy Supplies (NEBULIZER/TUBING/MOUTHPIECE) KIT Use as directed with nebulizer treatments (Duoneb) every 4 hours prn. Dx: J44.9 Patient taking differently: 1 each by Other route See admin instructions. Use as directed with nebulizer treatments (Duoneb) every 4 hours prn. Dx: J44.9 06/20/18   Trixie Dredge, PA-C  Tiotropium Bromide-Olodaterol (STIOLTO RESPIMAT) 2.5-2.5 MCG/ACT AERS Inhale 2 puffs into the lungs daily. Patient not taking: Reported on 11/26/2019 10/27/18   Trixie Dredge, PA-C     Critical care time: Maudie Mercury, MD IMTS, PGY-2 Pager: [phone] 12/07/2019,7:38 AM

## 2019-12-07 NOTE — Progress Notes (Signed)
STROKE TEAM PROGRESS NOTE   INTERVAL HISTORY Patient  Remains intubated for respiratory failure but now is arousable and interactive and follows commands.  Vital signs are stable.  Vitals:   12/07/19 1000 12/07/19 1100 12/07/19 1115 12/07/19 1230  BP: (!) 107/53 (!) 98/52 (!) 98/52 (!) 108/58  Pulse: (!) 101 92 90   Resp: 14 15 19 16   Temp:   99 F (37.2 C)   TempSrc:   Axillary   SpO2: 100% 100% 100%   Weight:      Height:       CBC:  Recent Labs  Lab 12/06/19 0739 12/07/19 0625  WBC 13.8* 13.6*  HGB 8.4* 7.0*  HCT 25.6* 21.4*  MCV 94.8 94.7  PLT 262 962   Basic Metabolic Panel:  Recent Labs  Lab 12/06/19 0739 12/06/19 1003 12/06/19 1646 12/07/19 0625  NA 143  --   --  144  K 4.5  --   --  3.1*  CL 113*  --   --  113*  CO2 13*  --   --  19*  GLUCOSE 209*  --   --  232*  BUN 28*  --   --  39*  CREATININE 1.40*  --   --  1.60*  CALCIUM 7.8*  --   --  7.5*  MG  --    < > 1.8 2.0  PHOS  --    < > 2.7 2.1*   < > = values in this interval not displayed.   Last lipids Lab Results  Component Value Date   CHOL 131 11/27/2019   HDL 40 (L) 11/27/2019   LDLCALC 76 11/27/2019   TRIG 61 12/05/2019   CHOLHDL 3.3 11/27/2019   Lab Results  Component Value Date   HGBA1C 6.3 (H) 11/27/2019    IMAGING past 24 hours MR BRAIN WO CONTRAST  Result Date: 12/07/2019 CLINICAL DATA:  Stroke, follow-up. EXAM: MRI HEAD WITHOUT CONTRAST TECHNIQUE: Multiplanar, multiecho pulse sequences of the brain and surrounding structures were obtained without intravenous contrast. COMPARISON:  Brain MRI 12/02/2019, CT angiogram head/neck 12/20/2019 FINDINGS: Brain: Stable, moderate generalized parenchymal atrophy. Redemonstrated cortically based subacute infarct centered within the right frontal operculum. Corresponding T2/FLAIR hyperintensity at this site. No significant mass effect or midline shift. Restricted diffusion at site of a known additional small subacute infarct within the right  parietal lobe has resolved. T2/FLAIR hyperintensity is present at this site. No interval acute infarct is demonstrated. Redemonstrated background remote cortically based infarcts within the right frontal and parietal lobes, bilateral occipital lobes and right temporal lobe. Redemonstrated chronic lacunar infarcts within the right corona radiata, bilateral basal ganglia, right thalamus and left cerebellum. Stable background moderate patchy T2/FLAIR hyperintensity within the cerebral white matter which is nonspecific, but consistent with chronic small vessel ischemic disease. No evidence of intracranial mass. No chronic intracranial blood products. No extra-axial fluid collection. Vascular: Expected proximal arterial flow voids. Skull and upper cervical spine: No focal marrow lesion Sinuses/Orbits: Visualized orbits show no acute finding. Mild paranasal sinus mucosal thickening, most notably ethmoidal. IMPRESSION: Unchanged appearance of a subacute right MCA territory infarct centered within the right frontal operculum as compared to the MRI of 12/24/2019. No significant mass effect or midline shift. Expected evolution of a known subacute infarct within the right parietal lobe. No interval acute infarct is demonstrated. Stable background generalized parenchymal atrophy and chronic ischemic changes with multiple chronic infarcts as detailed. Electronically Signed   By: Kellie Simmering DO  On: 12/07/2019 13:21    PHYSICAL EXAM Pleasant elderly Caucasian male who is sedated and intubated right carotid endarterectomy surgical incision with surgical drain is noted. . Afebrile. Head is nontraumatic. Neck is supple without bruit.    Cardiac exam no murmur or gallop. Lungs are clear to auscultation. Distal pulses are well felt. Neurological Exam :  Patient is sedated and intubated.  He is awake and follows all commands well.  Pupils are small reactive.  Extraocular movements are full range without nystagmus..  Does  blink  to threat on either side.  Mild left lower facial asymmetry.  Tongue midline motor system exam shows antigravity strength in all 4 extremities but weakness of the left upper extremities with slight drift.  Plantars are downgoing.  Gait not tested. ASSESSMENT/PLAN Kelly Williams is a 79 y.o. male with history of diabetes, hypertension, hyperlipidemia, who suffered a mild stroke likely due to ICA stenosis last month who underwent redo carotid endarterectomy 8/3.  Following the endarterectomy, he was initially recovering well in postop, but then became unresponsive on the floor. Carotid doppler demonstrated R ICA reocclusion, however found to be open in the OR. CTA did not show LVO, but some R ICA thrombus.   Recent R MCA small infarcts due to R ICA stenosis s/p R CEA yesterday w/ neuro changes postop W/O post op reocclusion, no new stroke  Carotid Doppler  R ICA c/w total occlusion   CTA head & neck no acute abnormality. S/p repeat R CEA w/ decreased irregularity of cervical ICA. Persisted narrowing distal ICA w/ increased filling defect, ? Clot. L ICA 60-70% stenosis.  MRI  Unchanged R MCA infarct. No new infarct.   LDL 76  HgbA1c 6.3  aspirin 325 mg daily and clopidogrel 75 mg daily prior to admission, now on aspirin 81 mg daily and clopidogrel 75 mg daily.   D/c w/ HH therapies last admission   No need to repeat stroke workup  Carotid disease, bilateral   Hx R CEA 10 years ago and L CEA 7 years ago  S/p right CEA re-do 8/3  VVS following left ICA stenosis  Atrial Fibrillation, new onset this hospitalization  Last INR 1.0  CHA2DS2-VASc Score = 7  No AC give post OP status   Hypertension  Stable . Long-term BP goal 130-150  Hyperlipidemia  Home meds:  lipitor 80, resumed in hospital  LDL 76, goal < 70  Continue statin at discharge  Diabetes type II Controlled  HgbA1c 6.3, goal < 7.0  Other Stroke Risk Factors  Advanced age  Former Cigarette smoker, quit  17 yrs ago  Chronic ETOH abuse,   Hx stroke/TIA  7/22021 - R MCA small infarcts, likely due to R ICA stenosis s/p CEA in past. For R CEA re do  Coronary artery disease w/ hx CABG x 5   Chronic systolic cardiomyopathy  Other Active Problems  Hx COPD  CKD stage III  Peripheral neuropathy  GERD  Hospital day # 3  Patient had developed respiratory distress and had been intubated but seems to be doing well and possibly could be extubated today.  Discussed with Dr. Ruta Hinds vascular surgeon since the patient had left-sided weakness postop and MRI did not document in acute infarct the question of why he had neurological worsening is still not well onset and hence recommend repeating an MRI scan of the brain to look for interval new strokes not seen on the initial MRI.  Discussed with Dr. Silas Flood critical  care medicine.This patient is critically ill and at significant risk of neurological worsening, death and care requires constant monitoring of vital signs, hemodynamics,respiratory and cardiac monitoring, extensive review of multiple databases, frequent neurological assessment, discussion with family, other specialists and medical decision making of high complexity.I have made any additions or clarifications directly to the above note.This critical care time does not reflect procedure time, or teaching time or supervisory time of PA/NP/Med Resident etc but could involve care discussion time.  I spent 30 minutes of neurocritical care time  in the care of  this patient.     Kelly Contras, MD To contact Stroke Continuity provider, please refer to [email or web address] After hours, contact General Neurology

## 2019-12-07 NOTE — Evaluation (Signed)
Physical Therapy Evaluation Patient Details Name: Kelly Williams MRN: [id number] DOB: 1940/09/29 Today's Date: 12/07/2019   History of Present Illness  Pt is a 79 y/o male with PMH of stroke, HTN, DM, periphreal neuropathy, carotid disease with L carotid endarterectomy, CABG x 5, R shoulder arthroplasthy, medication noncompliance and alcohol abuse. Daughter found patient after falling and intoxicated, L facial droop, slurring speech and aggression (reports hx of mulitple falls). MRI reveals acute ischemic infarcts of R frontal and parietal lobes. MRA of neck revealed focal stenosis in the right internal carotid artery approximately 3 cm distal to the bifurcation with an adjacent 11 mm aneurysmal dilatation. Pt underwent R carotid endarterectomy on 8/3. Pt with change in neuro status after procedure and was taken back to OR for exploration with no significant findings. Pt required intubation on 8/5 due to desaturation.  Clinical Impression  Pt presents to PT session with deficits in strength, power, mobility, and cardiopulmonary function. Pt with bilateral LE weakness, L weakness more significant than R. Pt also with RUE weakness which may be chronic in nature due to previous R total shoulder replacement per daughter. PT defers mobility assessment to edge of bed as pt intubated and currently receiving blood products, but does transition pt to bed in chair mode which pt tolerates well. Pt will benefit from further assessment of functional mobility and gait at the next session, however will likely require placement at the time of discharge as the pt has no caregiver support at home during the day per daughter.    Follow Up Recommendations CIR (pending further assessment of mobility)    Equipment Recommendations   (TBD)    Recommendations for Other Services       Precautions / Restrictions Precautions Precautions: Fall Precaution Comments: CIWA  Restrictions Weight Bearing Restrictions: No       Mobility  Bed Mobility Overal bed mobility: Needs Assistance             General bed mobility comments: pt transitioned to bed in chair mode, otherwise bed mobility deferred as pt remains intubated and receiving blood products at this time  Transfers                    Ambulation/Gait                Stairs            Wheelchair Mobility    Modified Rankin (Stroke Patients Only)       Balance                                             Pertinent Vitals/Pain Pain Assessment: Faces Faces Pain Scale: Hurts little more Pain Location: grimace with movement in bed Pain Descriptors / Indicators: Grimacing Pain Intervention(s): Monitored during session    Home Living Family/patient expects to be discharged to:: Private residence Living Arrangements: Children Available Help at Discharge: Family;Available PRN/intermittently Type of Home: Apartment Home Access: Stairs to enter Entrance Stairs-Rails: Right Entrance Stairs-Number of Steps: 2 flights Home Layout: One level Home Equipment: [place] - 4 wheels;Cane - single point;Shower seat;Walker - 2 wheels Additional Comments: daugher works during the day, pt alone 7am -5pm    Prior Function Level of Independence: Independent with assistive device(s)         Comments: pt has been utilizing RW since recent admission  Hand Dominance   Dominant Hand: Right    Extremity/Trunk Assessment   Upper Extremity Assessment Upper Extremity Assessment: Defer to OT evaluation    Lower Extremity Assessment Lower Extremity Assessment: LLE deficits/detail RLE Deficits / Details: grossly 4/5 LLE Deficits / Details: grossly 4-/5       Communication   Communication:  (intubated, does nod head appropriately to choice of 2)  Cognition Arousal/Alertness: Awake/alert Behavior During Therapy: WFL for tasks assessed/performed Overall Cognitive Status: Difficult to assess Area of  Impairment: Attention;Following commands                   Current Attention Level: Sustained   Following Commands: Follows one step commands consistently              General Comments General comments (skin integrity, edema, etc.): VSS on vent    Exercises     Assessment/Plan    PT Assessment Patient needs continued PT services  PT Problem List Decreased strength;Decreased activity tolerance;Decreased balance;Decreased mobility;Decreased knowledge of use of DME;Cardiopulmonary status limiting activity       PT Treatment Interventions DME instruction;Gait training;Stair training;Functional mobility training;Therapeutic activities;Therapeutic exercise;Balance training;Neuromuscular re-education;Cognitive remediation;Patient/family education    PT Goals (Current goals can be found in the Care Plan section)  Acute Rehab PT Goals Patient Stated Goal: To improve mobility and return to independence PT Goal Formulation: With family Time For Goal Achievement: 12/21/19 Potential to Achieve Goals: Good    Frequency Min 3X/week   Barriers to discharge Decreased caregiver support;Inaccessible home environment      Co-evaluation PT/OT/SLP Co-Evaluation/Treatment: Yes Reason for Co-Treatment: Complexity of the patient's impairments (multi-system involvement) PT goals addressed during session: Mobility/safety with mobility;Strengthening/ROM         AM-PAC PT "6 Clicks" Mobility  Outcome Measure Help needed turning from your back to your side while in a flat bed without using bedrails?: A Little Help needed moving from lying on your back to sitting on the side of a flat bed without using bedrails?: A Little Help needed moving to and from a bed to a chair (including a wheelchair)?: A Lot Help needed standing up from a chair using your arms (e.g., wheelchair or bedside chair)?: A Lot Help needed to walk in hospital room?: A Lot Help needed climbing 3-5 steps with a  railing? : A Lot 6 Click Score: 14    End of Session   Activity Tolerance: Patient tolerated treatment well Patient left: in bed;with call bell/phone within reach;with family/visitor present;with restraints reapplied Nurse Communication: Mobility status PT Visit Diagnosis: Other abnormalities of gait and mobility (R26.89);Other symptoms and signs involving the nervous system (R29.898)    Time: 4008-6761 PT Time Calculation (min) (ACUTE ONLY): 16 min   Charges:   PT Evaluation $PT Eval Moderate Complexity: 1 Mod          Zenaida Niece, PT, DPT Acute Rehabilitation Pager: [phone]   Zenaida Niece 12/07/2019, 5:18 PM

## 2019-12-08 ENCOUNTER — Inpatient Hospital Stay (HOSPITAL_COMMUNITY): Payer: Medicare HMO

## 2019-12-08 DIAGNOSIS — G459 Transient cerebral ischemic attack, unspecified: Secondary | ICD-10-CM

## 2019-12-08 DIAGNOSIS — Z8673 Personal history of transient ischemic attack (TIA), and cerebral infarction without residual deficits: Secondary | ICD-10-CM | POA: Diagnosis not present

## 2019-12-08 LAB — BASIC METABOLIC PANEL
Anion gap: 8 (ref 5–15)
BUN: 45 mg/dL — ABNORMAL HIGH (ref 8–23)
CO2: 17 mmol/L — ABNORMAL LOW (ref 22–32)
Calcium: 6.8 mg/dL — ABNORMAL LOW (ref 8.9–10.3)
Chloride: 115 mmol/L — ABNORMAL HIGH (ref 98–111)
Creatinine, Ser: 1.7 mg/dL — ABNORMAL HIGH (ref 0.61–1.24)
GFR calc Af Amer: 43 mL/min — ABNORMAL LOW (ref >60)
GFR calc non Af Amer: 38 mL/min — ABNORMAL LOW (ref >60)
Glucose, Bld: 223 mg/dL — ABNORMAL HIGH (ref 70–99)
Potassium: 3.7 mmol/L (ref 3.5–5.1)
Sodium: 140 mmol/L (ref 135–145)

## 2019-12-08 LAB — CBC
HCT: 23.7 % — ABNORMAL LOW (ref 39.0–52.0)
Hemoglobin: 7.8 g/dL — ABNORMAL LOW (ref 13.0–17.0)
MCH: 30.6 pg (ref 26.0–34.0)
MCHC: 32.9 g/dL (ref 30.0–36.0)
MCV: 92.9 fL (ref 80.0–100.0)
Platelets: 206 10*3/uL (ref 150–400)
RBC: 2.55 MIL/uL — ABNORMAL LOW (ref 4.22–5.81)
RDW: 14.4 % (ref 11.5–15.5)
WBC: 13.9 10*3/uL — ABNORMAL HIGH (ref 4.0–10.5)
nRBC: 0 % (ref 0.0–0.2)

## 2019-12-08 LAB — GLUCOSE, CAPILLARY
Glucose-Capillary: 194 mg/dL — ABNORMAL HIGH (ref 70–99)
Glucose-Capillary: 229 mg/dL — ABNORMAL HIGH (ref 70–99)
Glucose-Capillary: 228 mg/dL — ABNORMAL HIGH (ref 70–99)
Glucose-Capillary: 169 mg/dL — ABNORMAL HIGH (ref 70–99)
Glucose-Capillary: 192 mg/dL — ABNORMAL HIGH (ref 70–99)
Glucose-Capillary: 226 mg/dL — ABNORMAL HIGH (ref 70–99)

## 2019-12-08 LAB — D-DIMER, QUANTITATIVE: D-Dimer, Quant: 3.36 ug/mL-FEU — ABNORMAL HIGH (ref 0.00–0.50)

## 2019-12-08 LAB — CULTURE, RESPIRATORY W GRAM STAIN
Culture: NORMAL
Gram Stain: NONE SEEN
Special Requests: NORMAL

## 2019-12-08 LAB — HEMOGLOBIN AND HEMATOCRIT, BLOOD
HCT: 27.1 % — ABNORMAL LOW (ref 39.0–52.0)
Hemoglobin: 8.9 g/dL — ABNORMAL LOW (ref 13.0–17.0)

## 2019-12-08 LAB — MAGNESIUM: Magnesium: 2 mg/dL (ref 1.7–2.4)

## 2019-12-08 MED ORDER — GUAIFENESIN-DM 100-10 MG/5ML PO SYRP
15.0000 mL | ORAL_SOLUTION | ORAL | Status: DC | PRN
  Administered 2019-12-08 – 2019-12-11 (×2): 15 mL
  Filled 2019-12-08 (×2): qty 15

## 2019-12-08 MED ORDER — ORAL CARE MOUTH RINSE
15.0000 mL | Freq: Two times a day (BID) | OROMUCOSAL | Status: DC
  Administered 2019-12-08 – 2019-12-11 (×8): 15 mL via OROMUCOSAL

## 2019-12-08 MED ORDER — LOPERAMIDE HCL 1 MG/7.5ML PO SUSP
2.0000 mg | ORAL | Status: DC | PRN
  Filled 2019-12-08 (×2): qty 15

## 2019-12-08 MED ORDER — POLYETHYLENE GLYCOL 3350 17 G PO PACK: 17.0000 g | PACK | Freq: Every day | ORAL | Status: DC | PRN

## 2019-12-08 MED ORDER — INSULIN GLARGINE 100 UNIT/ML ~~LOC~~ SOLN
10.0000 [IU] | Freq: Every day | SUBCUTANEOUS | Status: DC
  Administered 2019-12-08: 10 [IU] via SUBCUTANEOUS
  Filled 2019-12-08 (×2): qty 0.1

## 2019-12-08 MED ORDER — DOCUSATE SODIUM 50 MG/5ML PO LIQD: 100.0000 mg | Freq: Two times a day (BID) | ORAL | Status: DC | PRN

## 2019-12-08 NOTE — Progress Notes (Signed)
RT NOTES: NT Suctioned per Dr Maryjean Ka for moderate thick tan, pink-tinged secretions. Patient tolerated well. Will continue to monitor.

## 2019-12-08 NOTE — Progress Notes (Signed)
    Subjective  - POD #4, status post redo right carotid endarterectomy requiring reexploration.  Intubated   Physical Exam:  Remains intubated Moves all 4 extremities to command Carotid incision is without significant hematoma       Assessment/Plan:  POD #4  The patient appears to be neurologically intact on the ventilator as he is able to squeeze both of my hands and move his feet to command.  Hopefully he will be able to be extubated soon.  Wells Chrys Landgrebe 12/08/2019 9:54 AM --  Vitals:   12/08/19 0823 12/08/19 0900  BP: 99/60 (!) 108/53  Pulse: 84 95  Resp: 17 19  Temp:    SpO2: 100% 100%    Intake/Output Summary (Last 24 hours) at 12/08/2019 0954 Last data filed at 12/08/2019 0800 Gross per 24 hour  Intake 3760.67 ml  Output 675 ml  Net 3085.67 ml     Laboratory CBC    Component Value Date/Time   WBC 13.9 (H) 12/08/2019 0604   HGB 7.8 (L) 12/08/2019 0604   HCT 23.7 (L) 12/08/2019 0604   PLT 206 12/08/2019 0604    BMET    Component Value Date/Time   NA 140 12/08/2019 0604   K 3.7 12/08/2019 0604   CL 115 (H) 12/08/2019 0604   CO2 17 (L) 12/08/2019 0604   GLUCOSE 223 (H) 12/08/2019 0604   BUN 45 (H) 12/08/2019 0604   CREATININE 1.70 (H) 12/08/2019 0604   CREATININE 1.34 (H) 08/24/2019 1114   CALCIUM 6.8 (L) 12/08/2019 0604   GFRNONAA 38 (L) 12/08/2019 0604   GFRNONAA 50 (L) 08/24/2019 1114   GFRAA 43 (L) 12/08/2019 0604   GFRAA 58 (L) 08/24/2019 1114    COAG Lab Results  Component Value Date   INR 1.0 12/28/2019   INR 1.0 11/26/2019   No results found for: PTT  Antibiotics Anti-infectives (From admission, onward)   Start     Dose/Rate Route Frequency Ordered Stop   12/06/19 0900  Ampicillin-Sulbactam (UNASYN) 3 g in sodium chloride 0.9 % 100 mL IVPB     Discontinue     3 g 200 mL/hr over 30 Minutes Intravenous Every 6 hours 12/06/19 0820     12/09/2019 1700  ceFAZolin (ANCEF) IVPB 2g/100 mL premix        2 g 200 mL/hr over 30 Minutes  Intravenous Every 8 hours 01/01/2020 1436 12/05/19 0859   12/29/2019 0743  ceFAZolin (ANCEF) IVPB 2g/100 mL premix        2 g 200 mL/hr over 30 Minutes Intravenous 30 min pre-op 12/13/2019 0743 12/17/2019 0942       V. Leia Alf, M.D., [facility] [place] Office: [phone] Pager:  [phone]

## 2019-12-08 NOTE — Progress Notes (Signed)
Notified of acute desaturation. Have been trying to manage upper airway congestion with pulm toilet, NT suction.   Place on BiPap, get CXR to see if any acute changes, d-dimer. Continue pulm toilet, suctioning.   Reintubate if conservative measures don't achieve goals.   Bonna Gains MD, PhD 4:40 PM

## 2019-12-08 NOTE — Evaluation (Signed)
SLP Cancellation Note  Patient Details Name: Kelly Williams MRN: [id number] DOB: [birth year]   Cancelled treatment:       Reason Eval/Treat Not Completed: Other (comment) (Order for swallow evaluation received.  Pt extubated but with  respiratory issues in afternoon, needing NTS, plan for Bipap and possible intubation if indicated, will continue efforts)  Kelly Lime, MS [facility] SLP Acute Rehab Services Office [phone]  Macario Golds 12/08/2019, 5:03 PM

## 2019-12-08 NOTE — Progress Notes (Signed)
NAME:  Kelly Williams, MRN:  [id number], DOB:  Feb 08, 1941, LOS: 4 ADMISSION DATE:  12/09/2019, CONSULTATION DATE:  12/15/2019 REFERRING MD:  Dr. Oneida Alar, CHIEF COMPLAINT:  Vent Management   Brief History   Kelly Williams is a 79 y/o M, admitted to [facility] for planned re-do of R CEA after CTA on prior admission revealed bilateral carotid stenosis R>L. He had a change in mental status and STAT duplex showed concern for ICA occlusion. He was taken back to the OR; however, ICA was found to be patent. He was subsequently sent for STAT CTA head which head which did not show any acute event. Returned to the ICU and PCCM was asked to assist with vent management.   Significant Hospital Events   8/3: Admitted to [facility], CEA, PCCM Consult for vent management.  8/4: Self extubated.  8/5: Re-intubated O/N  Consults:  PCCM Stroke team  Procedures:  8/3: ETT 8/3: R CEA 8/3 Exploration of R CEA and R ICA thrombectomy 8/3: R art line   Significant Diagnostic Tests:  Carotid duplex 8/3 > right ICA occlusion. 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 Persistent narrowing and irregularity of the distal ICA with increased eccentric intraluminal filling defect, which may reflect unstable, migrated clot. 60-70% stenosis of the proximal left ICA. No proximal intracranial vessel occlusion. Stable findings of multifocal intracranial atherosclerosis.  EEG 8/3 > Severe diffuse encephalopathy, nonspecific etiology but likely related to sedation. NO seizures or epileptiform discharges seen throughout the recording.  MRI brain 8/3 > Unchanged appearance of acute R MCA territory infarct. No new site of acute ischemia. Unchanged generalized atrophy and findings of chronic ischemic microangiopathy.  MRI Brain 8/7> Unchanged appearance of subacute R MCA Territory infarct.   Micro Data:  8/3> MRSA PCR:  Negative 8/5> Sputum culture: No organisms seen. Pending culture.  Antimicrobials:  Unasyn 8/5>>  Interim history/subjective:  Kelly Williams was seen and evaluated at bedside this AM. Much more active today, able to mime answers, such as pointing to his tube and making a motion to pull. We discussed at bedside that if he continues to wean off the vent, possibly he could be extubated later this afternoon. Patient gave the thumbs up in response.   Will continue SBT, if clears will plan on extubating this PM.    Objective   Blood pressure (!) 104/55, pulse 83, temperature 99.4 F (37.4 C), temperature source Oral, resp. rate 18, height 5' 6"  (1.676 m), weight 63.5 kg, SpO2 100 %.    Vent Mode: PRVC FiO2 (%):  [40 %] 40 % Set Rate:  [18 bmp] 18 bmp Vt Set:  [510 mL] 510 mL PEEP:  [5 cmH20] 5 cmH20 Pressure Support:  [12 cmH20-14 cmH20] 12 cmH20 Plateau Pressure:  [16 cmH20-18 cmH20] 17 cmH20   Intake/Output Summary (Last 24 hours) at 12/08/2019 0711 Last data filed at 12/08/2019 0600 Gross per 24 hour  Intake 3748.69 ml  Output 675 ml  Net 3073.69 ml   Filed Weights   12/07/2019 0731  Weight: 63.5 kg    Examination: Physical Exam Constitutional:      Appearance: Normal appearance.     Comments: Alert, answers questions with hand gestures.   Eyes:     Pupils: Pupils are equal, round, and reactive to light.  Cardiovascular:     Rate and Rhythm: Normal rate and regular rhythm.  Pulses: Normal pulses.     Heart sounds: Normal heart sounds. No murmur heard.  No friction rub. No gallop.   Pulmonary:     Effort: Pulmonary effort is normal.     Breath sounds: Normal breath sounds.     Comments: Intubated.  Abdominal:     General: Abdomen is flat. Bowel sounds are normal.  Neurological:     Mental Status: He is alert.     Resolved Hospital Problem list     Assessment & Plan:  Acute Respiratory Failure: Originally unable to protect airway s/p surgical intervention prior to  transfer to the ICU. Self extubated 8/4 saturating well and maintaining airway. He was re-intubated on 8/5 due to a desaturation event 2/2 to large aspiration event. Post intubation CXR showing RLL infiltrate with small effusion, mild fever, and rhonchi found on R lung favoring pneumonia over chemical pneumonitis. Spoke with Neurology today at bedside,  will hold extubation after brain MRI.  - Possible extubation pending Brain MRI, will need to monitor secretions.  - Unasyn 3g IV - Continue to monitor vitals - Monitor I/O  Aspiration Pneumonia:  Patient with sharp desaturation, likely due to aspiration event on 8/5 requiring intubation. Post intubation xray showing R lower lobe infiltrate/small pleural effusion, mild temperature, rhonchi present on physical examination. No fevering events ON - Unasyn 3g IV  Bilateral Carotid Artery Stenosis R > L  - S/P R CEA redo on 8/1. His surgical course was complicated by change in mental status post op and concern for ICA occlusion; therefore, taken back to OR for re-exploration but found to have no occlusion. Brain MRI yesterday showed no changes compared to previous imaging.   - Post op care per vascular.  Refeeding Syndrome:  Patient with history of ETOH abuse, difficulty swallowing food at home, and failed speech eval, with labs showing concern for metabolic acidosis, with increased B-hydroxy and LA, was restarted on tube feeds with newfound hypophosphatemia and hypokalemia. Continue to follow Mg, Phos, BMP, replenish as needed. - Mg: 2.0 - Phos: 4.5  - K: 3.7 - Trend BMP, Phos, Mg  Atrial Fibrillation:  New this hospitalization likely 2/2 to R CEA. Continues to convert between NSR and a. Fib. In NSR this morning at bedside.  - Holding anticoagulation 2/2 to recent procedure.   Hypertension:  Systolic 37/628 ON with Pulse of 83-104 - Holding home dose losartan - SBP goal 120-140 per neuro / vascular  Hyperlipidemia Coronary Artery  Disease: - DC Plavix   CKD: Will continue to trend Cr, has steadily climbed since starting unasyn, which is concerning for intrarenal insult, will continue to follow up.  - Cr increased 1.7 from 1.6.  - -635 mL UOP - + 3L ON   DM: - Sugars elevated with range 183-232 requiring 24U short acting yesterday.  - Lantus 10U ordered - SSI. - Hold home trulicity.  Hx EtOH use, peripheral neuropathy: - Thiamine / folate. - Continue home gabapentin.   Best practice:  Diet: Tube feeds. Pain/Anxiety/Delirium protocol (if indicated): None VAP protocol (if indicated): In place. DVT prophylaxis: SCD's / Heparin. GI prophylaxis: PPI. Glucose control: SSI. Mobility: Bedrest. Code Status: Full. Family Communication: Per family. Disposition: ICU  Labs   CBC: Recent Labs  Lab 12/08/2019 0837 12/05/19 0216 12/05/19 0426 12/05/19 0426 12/06/19 0548 12/06/19 0739 12/07/19 0625 12/07/19 2030 12/08/19 0604  WBC 10.5  --  11.4*  --   --  13.8* 13.6*  --  13.9*  HGB 11.5*   < >  7.4*   < > 8.8* 8.4* 7.0* 9.7* 7.8*  HCT 36.1*   < > 23.2*   < > 26.0* 25.6* 21.4* 29.1* 23.7*  MCV 95.0  --  94.7  --   --  94.8 94.7  --  92.9  PLT 354  --  241  --   --  262 229  --  206   < > = values in this interval not displayed.    Basic Metabolic Panel: Recent Labs  Lab 12/24/2019 0837 12/05/19 0216 12/05/19 0426 12/05/19 0426 12/06/19 0548 12/06/19 0739 12/06/19 1003 12/06/19 1156 12/06/19 1646 12/07/19 0625 12/07/19 2030 12/08/19 0604  NA 139   < > 140  --  143 143  --   --   --  144  --  140  K 4.4   < > 4.2  --  4.2 4.5  --   --   --  3.1*  --  3.7  CL 109  --  113*  --   --  113*  --   --   --  113*  --  115*  CO2 21*  --  17*  --   --  13*  --   --   --  19*  --  17*  GLUCOSE 116*  --  236*  --   --  209*  --   --   --  232*  --  223*  BUN 24*  --  27*  --   --  28*  --   --   --  39*  --  45*  CREATININE 1.22  --  1.32*  --   --  1.40*  --   --   --  1.60*  --  1.70*  CALCIUM 9.0   --  7.7*  --   --  7.8*  --   --   --  7.5*  --  6.8*  MG  --   --  1.8   < >  --   --  2.0  --  1.8 2.0 2.2 2.0  PHOS  --   --  4.4  --   --   --   --  3.4 2.7 2.1* 4.5  --    < > = values in this interval not displayed.   GFR: Estimated Creatinine Clearance: 31.6 mL/min (A) (by C-G formula based on SCr of 1.7 mg/dL (H)). Recent Labs  Lab 12/05/19 0426 12/06/19 0739 12/06/19 1150 12/07/19 0625 12/08/19 0604  WBC 11.4* 13.8*  --  13.6* 13.9*  LATICACIDVEN  --   --  2.7*  --   --     Liver Function Tests: Recent Labs  Lab 12/31/2019 0837  AST 34  ALT 40  ALKPHOS 82  BILITOT 0.7  PROT 7.2  ALBUMIN 3.6   No results for input(s): LIPASE, AMYLASE in the last 168 hours. No results for input(s): AMMONIA in the last 168 hours.  ABG    Component Value Date/Time   PHART 7.350 12/06/2019 0548   PCO2ART 30.3 (L) 12/06/2019 0548   PO2ART 377 (H) 12/06/2019 0548   HCO3 16.8 (L) 12/06/2019 0548   TCO2 18 (L) 12/06/2019 0548   ACIDBASEDEF 8.0 (H) 12/06/2019 0548   O2SAT 100.0 12/06/2019 0548     Coagulation Profile: Recent Labs  Lab 12/27/2019 0837  INR 1.0    Cardiac Enzymes: No results for input(s): CKTOTAL, CKMB, CKMBINDEX, TROPONINI in the last 168 hours.  HbA1C: Hemoglobin  A1C  Date/Time Value Ref Range Status  06/14/2019 09:18 AM 5.8 (A) 4.0 - 5.6 % Final   HbA1c, POC (prediabetic range)  Date/Time Value Ref Range Status  01/12/2019 04:58 PM 5.9 5.7 - 6.4 % Final  10/27/2018 01:36 PM 6.0 5.7 - 6.4 % Final   Hgb A1c MFr Bld  Date/Time Value Ref Range Status  11/27/2019 03:13 AM 6.3 (H) 4.8 - 5.6 % Final    Comment:    (NOTE) Pre diabetes:          5.7%-6.4%  Diabetes:              >6.4%  Glycemic control for   <7.0% adults with diabetes     CBG: Recent Labs  Lab 12/06/19 1606 12/06/19 2227 12/07/19 0817 12/07/19 2317 12/08/19 0336  GLUCAP 115* 244* 183* 206* 194*    Review of Systems:   Unable to ascertain as patient is intubated.   Past  Medical History  He,  has a past medical history of Alcohol abuse, Alcohol abuse, Arthritis, Carotid arterial disease ([place]), Chronic kidney disease, COPD (chronic obstructive pulmonary disease) ([place]), Coronary artery disease, Diabetes mellitus without complication ([place]), Diabetic retinopathy ([place]), Hyperlipidemia, Hypertension, Normocytic anemia (01/16/2019), and Stroke ([place]).   Surgical History    Past Surgical History:  Procedure Laterality Date  . APPENDECTOMY     as teenager  . BACK SURGERY    . CAROTID ENDARTERECTOMY Right   . CORONARY ARTERY BYPASS GRAFT     10-12 years ago in [place] at [facility]  . ENDARTERECTOMY Right 12/08/2019   Procedure: EXPLORATION OF NECK, PLACEMENT OF 59fBLAKE DRAIN;  Surgeon: DAngelia Mould MD;  [facility]  Service: Vascular;  Laterality: Right;  . ENDARTERECTOMY Right 12/23/2019   Procedure: REDO RIGHT CAROTID ENDARTERECTOMY;  Surgeon: FElam Dutch MD;  [facility]  Service: Vascular;  Laterality: Right;  . IR PATIENT EVAL TECH 0-60 MINS  11/27/2019  . PATCH ANGIOPLASTY Right 12/23/2019   Procedure: PATCH ANGIOPLASTY USING HEMASHIELD PLATINUM FINESSE CARDIOVASCULAR PATCH;  Surgeon: FElam Dutch MD;  [facility];  Service: Vascular;  Laterality: Right;  . REVERSE SHOULDER ARTHROPLASTY Right 07/18/2019   Procedure: REVERSE SHOULDER ARTHROPLASTY WITH SUBSCAPULAR REPAIR;  Surgeon: VHiram Gash MD;  Location: [facility];  Service: Orthopedics;  Laterality: Right;  . THROMBECTOMY BRACHIAL ARTERY Right 12/15/2019   Procedure: THROMBECTOMY CAROTID ARTERY;  Surgeon: DAngelia Mould MD;  [facility]  Service: Vascular;  Laterality: Right;  .Marland KitchenVASECTOMY       Social History   reports that he quit smoking about 17 years ago. His smoking use included cigarettes. He has a 10.00 pack-year smoking history. He has never used smokeless tobacco. He reports previous alcohol use. He reports that he does not use drugs.   Family  History   His family history includes Hypertension in his father and mother.   Allergies Allergies  Allergen Reactions  . Metformin And Related Diarrhea     Home Medications  Prior to Admission medications   Medication Sig Start Date End Date Taking? Authorizing Provider  albuterol (PROVENTIL HFA;VENTOLIN HFA) 108 (90 Base) MCG/ACT inhaler Inhale 1-2 puffs into the lungs every 4 (four) hours as needed for wheezing or shortness of breath (bronchospasm). 06/20/18  Yes CTrixie Dredge PA-C  Alcohol Swabs (B-D SINGLE USE SWABS REGULAR) PADS Use to check glucose daily Patient taking differently: 1 each by Other route See admin instructions. Use to check glucose  daily 09/07/19  Yes Luetta Nutting, DO  aspirin EC 325 MG EC tablet Take 1 tablet (325 mg total) by mouth daily. 11/30/19  Yes Shelly Coss, MD  atorvastatin (LIPITOR) 80 MG tablet Take 1 tablet (80 mg total) by mouth at bedtime. 11/29/19  Yes Shelly Coss, MD  Blood Glucose Monitoring Suppl (TRUE METRIX AIR GLUCOSE METER) DEVI Dx DM E11.9 Check fasting blood sugar every morning and once 2 hours after largest meal of the day. Patient taking differently: 1 each by Other route See admin instructions. Dx DM E11.9 Check fasting blood sugar every morning and once 2 hours after largest meal of the day. 09/13/19  Yes Luetta Nutting, DO  celecoxib (CELEBREX) 100 MG capsule Take 1 capsule (100 mg total) by mouth 2 (two) times daily as needed for mild pain. Patient taking differently: Take 200 mg by mouth 2 (two) times daily as needed for mild pain.  09/10/19  Yes Luetta Nutting, DO  clopidogrel (PLAVIX) 75 MG tablet Take 1 tablet (75 mg total) by mouth daily. 11/30/19  Yes Adhikari, Tamsen Meek, MD  Dulaglutide (TRULICITY) 1.88 CZ/6.6AY SOPN Inject 0.75 mg into the skin once a week. Patient taking differently: Inject 0.75 mg into the skin once a week. Saturday 09/10/19  Yes Luetta Nutting, DO  gabapentin (NEURONTIN) 300 MG capsule Take 1 capsule  (300 mg total) by mouth 3 (three) times daily. 09/07/19  Yes Luetta Nutting, DO  glucose blood (TRUE METRIX BLOOD GLUCOSE TEST) test strip Use as instructed Patient taking differently: 1 each by Other route as directed.  09/07/19  Yes Luetta Nutting, DO  losartan (COZAAR) 25 MG tablet Take 1 tablet (25 mg total) by mouth every morning. Patient taking differently: Take 25 mg by mouth at bedtime.  06/25/19  Yes Buford Dresser, MD  TRUEplus Lancets 30G MISC Use to check glucose daily Patient taking differently: 1 each by Other route daily.  09/07/19  Yes Luetta Nutting, DO  acetaminophen (TYLENOL) 500 MG tablet Take 1,000 mg by mouth every 6 (six) hours as needed for mild pain.    [provider]  bismuth subsalicylate (PEPTO BISMOL) 262 MG/15ML suspension Take 30 mLs by mouth every 6 (six) hours as needed for indigestion or diarrhea or loose stools.    [provider]  ipratropium-albuterol (DUONEB) 0.5-2.5 (3) MG/3ML SOLN Take 3 mLs by nebulization every 6 (six) hours as needed. Patient taking differently: Take 3 mLs by nebulization every 6 (six) hours as needed (COPD).  10/27/18   Trixie Dredge, PA-C  loperamide (IMODIUM) 2 MG capsule Take 1 capsule (2 mg total) by mouth every 8 (eight) hours as needed for diarrhea or loose stools. 11/29/19   Shelly Coss, MD  Respiratory Therapy Supplies (NEBULIZER/TUBING/MOUTHPIECE) KIT Use as directed with nebulizer treatments (Duoneb) every 4 hours prn. Dx: J44.9 Patient taking differently: 1 each by Other route See admin instructions. Use as directed with nebulizer treatments (Duoneb) every 4 hours prn. Dx: J44.9 06/20/18   Trixie Dredge, PA-C  Tiotropium Bromide-Olodaterol (STIOLTO RESPIMAT) 2.5-2.5 MCG/ACT AERS Inhale 2 puffs into the lungs daily. Patient not taking: Reported on 11/26/2019 10/27/18   Trixie Dredge, PA-C     Critical care time:  Maudie Mercury, MD IMTS, PGY-2 Pager:  [phone] 12/08/2019,10:34 AM

## 2019-12-08 NOTE — Plan of Care (Signed)
  Problem: Skin Integrity: Goal: Risk for impaired skin integrity will decrease Outcome: Progressing   Hiram Gash RN

## 2019-12-08 NOTE — Progress Notes (Signed)
Pt O2 saturation dropped suddenly, non-rebreather placed on patient and respiratory and CCM paged. Also noticed urine was blood tinged and patient was hypertensive and tachycardic.   Bi-pap orders, labs, and chest xray placed.  Will continue to monitor.  Hiram Gash RN

## 2019-12-08 NOTE — Progress Notes (Signed)
Inpatient Rehab Admissions Coordinator Note:   Per therapy recommendations, pt was screened for CIR candidacy by Clemens Catholic, [place] CCC-SLP. At this time, Pt. Remains unstable with possible need for re-intubation. Will follow for potential admit once Pt. Stabilizes. Clemens Catholic, [place], [place] Admissions Coordinator  [phone] ([place]) [phone] (office)

## 2019-12-08 NOTE — Progress Notes (Signed)
STROKE TEAM PROGRESS NOTE   INTERVAL HISTORY Patient remains intubated for respiratory failure but now is awake alert and follows commands. BP still on the lower side. Did not extubate due to vent settings yesterday. Hope to extubate today. Still on low dose propofol.   Vitals:   12/08/19 0600 12/08/19 0800 12/08/19 0823 12/08/19 0900  BP: (!) 104/55 (!) 107/54 99/60 (!) 108/53  Pulse: 83 81 84 95  Resp: 18 18 17 19   Temp:  98.7 F (37.1 C)    TempSrc:  Axillary    SpO2: 100% 100% 100% 100%  Weight:      Height:       CBC:  Recent Labs  Lab 12/07/19 0625 12/07/19 0625 12/07/19 2030 12/08/19 0604  WBC 13.6*  --   --  13.9*  HGB 7.0*   < > 9.7* 7.8*  HCT 21.4*   < > 29.1* 23.7*  MCV 94.7  --   --  92.9  PLT 229  --   --  206   < > = values in this interval not displayed.   Basic Metabolic Panel:  Recent Labs  Lab 12/07/19 0625 12/07/19 0625 12/07/19 2030 12/08/19 0604  NA 144  --   --  140  K 3.1*  --   --  3.7  CL 113*  --   --  115*  CO2 19*  --   --  17*  GLUCOSE 232*  --   --  223*  BUN 39*  --   --  45*  CREATININE 1.60*  --   --  1.70*  CALCIUM 7.5*  --   --  6.8*  MG 2.0   < > 2.2 2.0  PHOS 2.1*  --  4.5  --    < > = values in this interval not displayed.   Last lipids Lab Results  Component Value Date   CHOL 131 11/27/2019   HDL 40 (L) 11/27/2019   LDLCALC 76 11/27/2019   TRIG 61 12/05/2019   CHOLHDL 3.3 11/27/2019   Lab Results  Component Value Date   HGBA1C 6.3 (H) 11/27/2019    IMAGING past 24 hours MR BRAIN WO CONTRAST  Result Date: 12/07/2019 CLINICAL DATA:  Stroke, follow-up. EXAM: MRI HEAD WITHOUT CONTRAST TECHNIQUE: Multiplanar, multiecho pulse sequences of the brain and surrounding structures were obtained without intravenous contrast. COMPARISON:  Brain MRI 01/01/2020, CT angiogram head/neck 12/21/2019 FINDINGS: Brain: Stable, moderate generalized parenchymal atrophy. Redemonstrated cortically based subacute infarct centered within the  right frontal operculum. Corresponding T2/FLAIR hyperintensity at this site. No significant mass effect or midline shift. Restricted diffusion at site of a known additional small subacute infarct within the right parietal lobe has resolved. T2/FLAIR hyperintensity is present at this site. No interval acute infarct is demonstrated. Redemonstrated background remote cortically based infarcts within the right frontal and parietal lobes, bilateral occipital lobes and right temporal lobe. Redemonstrated chronic lacunar infarcts within the right corona radiata, bilateral basal ganglia, right thalamus and left cerebellum. Stable background moderate patchy T2/FLAIR hyperintensity within the cerebral white matter which is nonspecific, but consistent with chronic small vessel ischemic disease. No evidence of intracranial mass. No chronic intracranial blood products. No extra-axial fluid collection. Vascular: Expected proximal arterial flow voids. Skull and upper cervical spine: No focal marrow lesion Sinuses/Orbits: Visualized orbits show no acute finding. Mild paranasal sinus mucosal thickening, most notably ethmoidal. IMPRESSION: Unchanged appearance of a subacute right MCA territory infarct centered within the right frontal operculum as compared to  the MRI of 12/08/2019. No significant mass effect or midline shift. Expected evolution of a known subacute infarct within the right parietal lobe. No interval acute infarct is demonstrated. Stable background generalized parenchymal atrophy and chronic ischemic changes with multiple chronic infarcts as detailed. Electronically Signed   By: Kellie Simmering DO   On: 12/07/2019 13:21    PHYSICAL EXAM  Temp:  [97.6 F (36.4 C)-99.9 F (37.7 C)] 98.7 F (37.1 C) (08/07 0800) Pulse Rate:  [81-104] 95 (08/07 0900) Resp:  [10-25] 19 (08/07 0900) BP: (86-116)/(46-79) 108/53 (08/07 0900) SpO2:  [85 %-100 %] 100 % (08/07 0900) FiO2 (%):  [40 %] 40 % (08/07 0823)  General - Well  nourished, well developed, in no apparent distress, still intubated on low dose propofol.  Ophthalmologic - fundi not visualized due to noncooperation.  Cardiovascular - Regular rhythm and rate, not in afib.  Neuro - Patient is on low dose sedation, and still intubated.  He is awake alert and follows all commands well.  Pupils are small reactive.  Extraocular movements are full range without nystagmus, visual field full.  Facial symmetry difficult to exam due to ET tube. Tongue midline. Muscle system exam shows antigravity strength in all 4 extremities but weakness of the left upper extremities with mild drift and 4/5 finger grip.  Sensation symmetrical. FTN intact but slow on the left. Plantars are downgoing.  Gait not tested.  ASSESSMENT/PLAN [name] is a 79 y.o. male with history of diabetes, hypertension, hyperlipidemia, who suffered a mild stroke likely due to ICA stenosis last month who underwent redo carotid endarterectomy 8/3.  Following the endarterectomy, he was initially recovering well in postop, but then became unresponsive on the floor. Carotid doppler demonstrated R ICA reocclusion, however found to be open in the OR. CTA did not show LVO, but some R ICA thrombus.   Right brain TIA s/p R CEA  S/p right CEA redo 12/17/2019  Postop acute unresponsiveness  Carotid Doppler  R ICA c/w total occlusion   OR -> carotid artery is patent   CTA head & neck no acute abnormality. S/p repeat R CEA w/ decreased irregularity of cervical ICA. Persisted narrowing distal ICA w/ increased filling defect, ? Clot. L ICA 60-70% stenosis.  MRI x 2 No new infarct  EEG no seizure  aspirin 325 mg daily and clopidogrel 75 mg daily prior to admission, now on aspirin 81 mg daily and clopidogrel 75 mg daily.   Therapy recommendations pending  Disposition pending  Respiratory failure  Patient remained intubated after procedure  Still on vent due to high parameters  CCM on board  Hope  to extubate today  History of stroke  11/2019 admitted for right MCA small infarcts.  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  EF 50 to 55%.  LDL 76, A1c 6.3.  Put on DAPT and Lipitor 80. VVS on board recommend right CEA redo.  Carotid disease, bilateral   Hx R CEA 10 years ago and L CEA 7 years ago  S/p right CEA re-do 8/3  Left ICA 60 to 70% stenosis on CTA  VVS following left ICA stenosis as outpatient  Atrial Fibrillation, new onset this hospitalization  CHA2DS2-VASc Score = 7  Currently NSR  Concerning for transient A. fib associated with surgery  Continue DAPT for now  Consider 30-day cardiac event monitoring as outpatient   Hypotension History of hypertension  BP on lower side likely due to  propofol  DC BP meds  Close BP monitoring . Long-term BP goal 130-150  Hyperlipidemia  Home meds:  lipitor 80, resumed in hospital  LDL 76, goal < 70  Continue statin at discharge  Diabetes type II Controlled  HgbA1c 6.3, goal < 7.0  SSI  CBG monitoring  PCP follow-up  Other Stroke Risk Factors  Advanced age  Former Cigarette smoker, quit 17 yrs ago  Chronic ETOH abuse,   Hx stroke/TIA  7/22021 - R MCA small infarcts, likely due to R ICA stenosis s/p CEA in past. For R CEA re do  Coronary artery disease w/ hx CABG x 5   Chronic systolic cardiomyopathy  Other Active Problems  Hx COPD  Peripheral neuropathy  GERD  Anemia - Hgb - 7.0->9.7->7.8   Leukocytosis -13.8 ->13.6->13.9 (afebrile)  CKD stage 3a, creatinine - 1.60->1.70   Hospital day # 4  This patient is critically ill due to neurological worsening post right ICA procedure, respiratory failure needing ventilation, new onset A. fib, carotid stenosis bilaterally, hypotension and at significant risk of neurological worsening, death form recurrent stroke, hemorrhagic conversion, seizure, heart failure, hypotensive shock. This patient's care requires  constant monitoring of vital signs, hemodynamics, respiratory and cardiac monitoring, review of multiple databases, neurological assessment, discussion with family, other specialists and medical decision making of high complexity. I spent 35 minutes of neurocritical care time in the care of this patient.  I discussed with CCM Dr. Luan Pulling.  Rosalin Hawking, MD PhD Stroke Neurology 12/08/2019 7:53 PM   To contact Stroke Continuity provider, please refer to [email or web address] After hours, contact General Neurology

## 2019-12-08 NOTE — Procedures (Signed)
Extubation Procedure Note  Patient Details:   Name: Kelly Williams DOB: 03/23/1941 MRN: [id number]   Airway Documentation:   Patient extubated per orders. Positive cuff leak prior to extubation. Placed on 4lpm nasal cannula. Will continue to monitor. Vent end date: 12/08/19 Vent end time: 1118   Evaluation  O2 sats: stable throughout Complications: No apparent complications Patient did tolerate procedure well. Bilateral Breath Sounds: Clear, Diminished   Yes  Ander Purpura 12/08/2019, 11:18 AM

## 2019-12-08 NOTE — Progress Notes (Signed)
Physical Therapy Treatment Patient Details Name: Kelly Williams MRN: [id number] DOB: 23-Jun-1940 Today's Date: 12/08/2019    History of Present Illness Pt is a 79 y/o male with PMH of stroke, HTN, DM, periphreal neuropathy, carotid disease with L carotid endarterectomy, CABG x 5, R shoulder arthroplasthy, medication noncompliance and alcohol abuse. Daughter found patient after falling and intoxicated, L facial droop, slurring speech and aggression (reports hx of mulitple falls). MRI reveals acute ischemic infarcts of R frontal and parietal lobes. MRA of neck revealed focal stenosis in the right internal carotid artery approximately 3 cm distal to the bifurcation with an adjacent 11 mm aneurysmal dilatation. Pt underwent R carotid endarterectomy on 8/3. Pt with change in neuro status after procedure and was taken back to OR for exploration with no significant findings. Pt required intubation on 8/5 due to desaturation, extubated 12/08/19.      PT Comments    Pt extubated earlier today and already tolerating RA with sats in the mid 90s despite rattling cough.   Pt needed mod assist for EOB mobility and standing and was unable to side step with the support of PT.  He would likely do better with RW will attempt standing and transfer to chair with walker next session. He remains appropriate for CIR level therapies at discharge.  PT will continue to follow acutely for safe mobility progression.   Follow Up Recommendations  CIR     Equipment Recommendations  None recommended by PT    Recommendations for Other Services       Precautions / Restrictions Precautions Precautions: Fall Precaution Comments: CIWA     Mobility  Bed Mobility Overal bed mobility: Needs Assistance Bed Mobility: Supine to Sit     Supine to sit: Mod assist Sit to supine: Mod assist   General bed mobility comments: Mod assist to support trunk to come to sitting and completely move bil LEs to EOB. assist to lift both legs  back into bed when returning to supine.   Transfers Overall transfer level: Needs assistance Equipment used: 1 person hand held assist Transfers: Sit to/from Stand Sit to Stand: Mod assist         General transfer comment: Stood x 2 from EOB with mod assist, feet blocked to prevent anterior slippage, unable to side step.  Ambulation/Gait                 Stairs             Wheelchair Mobility    Modified Rankin (Stroke Patients Only)       Balance Overall balance assessment: Needs assistance Sitting-balance support: Feet supported;Bilateral upper extremity supported Sitting balance-Leahy Scale: Poor Sitting balance - Comments: needs support of bil UEs in sitting, unable to remove hand without falling to the side.  Postural control: Right lateral lean Standing balance support: Bilateral upper extremity supported Standing balance-Leahy Scale: Poor Standing balance comment: mod assist to maintain standing balance EOB.                             Cognition Arousal/Alertness: Awake/alert Behavior During Therapy: WFL for tasks assessed/performed Overall Cognitive Status: Impaired/Different from baseline Area of Impairment: Problem solving                       Following Commands: Follows one step commands with increased time     Problem Solving: Slow processing General Comments: Pt slow to process,  lethargic, however RN reports she just bathed him and gave him morphine.       Exercises      General Comments General comments (skin integrity, edema, etc.): VSS on RA throughout, pt with copious secreations, mutliple coughs with secreations suctioned by pt and [place].       Pertinent Vitals/Pain Pain Assessment: Faces Faces Pain Scale: Hurts little more Pain Location: generalized Pain Descriptors / Indicators: Grimacing Pain Intervention(s): Limited activity within patient's tolerance;Monitored during session;Repositioned    Home  Living                      Prior Function            PT Goals (current goals can now be found in the care plan section) Acute Rehab PT Goals Patient Stated Goal: Pt unable to state Progress towards PT goals: Progressing toward goals    Frequency    Min 3X/week      PT Plan Current plan remains appropriate    Co-evaluation              AM-PAC PT "6 Clicks" Mobility   Outcome Measure  Help needed turning from your back to your side while in a flat bed without using bedrails?: A Lot Help needed moving from lying on your back to sitting on the side of a flat bed without using bedrails?: A Lot Help needed moving to and from a bed to a chair (including a wheelchair)?: A Lot Help needed standing up from a chair using your arms (e.g., wheelchair or bedside chair)?: A Lot Help needed to walk in hospital room?: Total Help needed climbing 3-5 steps with a railing? : Total 6 Click Score: 10    End of Session   Activity Tolerance: Patient limited by fatigue Patient left: in bed;with call bell/phone within reach Nurse Communication: Mobility status PT Visit Diagnosis: Other abnormalities of gait and mobility (R26.89);Other symptoms and signs involving the nervous system (P29.518)     Time: 8416-6063 PT Time Calculation (min) (ACUTE ONLY): 22 min  Charges:  $Therapeutic Activity: 8-22 mins                    Verdene Lennert, PT, DPT  Acute Rehabilitation [phone] pager [phone]) [phone] office

## 2019-12-08 NOTE — Progress Notes (Signed)
Occupational Therapy Evaluation (late entry)  Pt presents to OT with the below listed diagnosis and deficits.  He was seen in conjunction with PT for bed level eval due to being intubated, weaning sedation, and receiving blood products.  He was alert, and able to follow one step commands.  He demonstrates full AROM Lt UE, but appears weaker than Rt.  Rt shoulder ROM limited due to recent TSA.  He requires mod - total A for ADLs at this time, but tolerated chair position and exercise well this date.  He lives with his daughter, who is a Marine scientist, and who works full time.  Disposition to be determined once extubated and once OT can fully assess mobility, ADLs, cognition.  Will follow acutely.    12/07/19 2000  OT Visit Information  Last OT Received On 12/08/19  Assistance Needed +1  PT/OT/SLP Co-Evaluation/Treatment Yes  Reason for Co-Treatment Complexity of the patient's impairments (multi-system involvement);For patient/therapist safety  OT goals addressed during session Strengthening/ROM  History of Present Illness Pt is a 79 y/o male with PMH of stroke, HTN, DM, periphreal neuropathy, carotid disease with L carotid endarterectomy, CABG x 5, R shoulder arthroplasthy, medication noncompliance and alcohol abuse. Daughter found patient after falling and intoxicated, L facial droop, slurring speech and aggression (reports hx of mulitple falls). MRI reveals acute ischemic infarcts of R frontal and parietal lobes. MRA of neck revealed focal stenosis in the right internal carotid artery approximately 3 cm distal to the bifurcation with an adjacent 11 mm aneurysmal dilatation. Pt underwent R carotid endarterectomy on 8/3. Pt with change in neuro status after procedure and was taken back to OR for exploration with no significant findings. Pt required intubation on 8/5 due to desaturation.  Precautions  Precautions Fall  Precaution Comments CIWA   Restrictions  Weight Bearing Restrictions No  Home Living   Family/patient expects to be discharged to: Private residence  Living Arrangements Children  Available Help at Discharge Family;Available PRN/intermittently  Type of Home Apartment  Home Access Stairs to enter  Entrance Stairs-Number of Steps 2 flights  Entrance Stairs-Rails Right  Home Layout One level  Bathroom Shower/Tub Tub/shower unit  Tax adviser - 4 wheels;Cane - single point;Clinical cytogeneticist - 2 wheels  Additional Comments daugher works during the day, pt alone 7am -5pm   Lives With Daughter  Prior Function  Level of Independence Independent with assistive device(s)  Comments pt has been utilizing RW since recent admission  Communication  Communication  (intubated, does nod head appropriately to choice of 2)  Pain Assessment  Pain Assessment Faces  Faces Pain Scale 4  Pain Location grimace with movement in bed  Pain Descriptors / Indicators Grimacing  Pain Intervention(s) Monitored during session  Cognition  Arousal/Alertness Awake/alert  Behavior During Therapy WFL for tasks assessed/performed  Overall Cognitive Status Difficult to assess  Area of Impairment Attention;Following commands  Current Attention Level Sustained  Following Commands Follows one step commands consistently  Difficult to assess due to Intubated  Upper Extremity Assessment  Upper Extremity Assessment RUE deficits/detail;LUE deficits/detail  RUE Deficits / Details Pt with h/o recent TSA which limits full isolated shoulder flexion  LUE Deficits / Details AROM WFL.  strength grossly 3+/5-4/5  Lower Extremity Assessment  Lower Extremity Assessment Defer to PT evaluation  ADL  Overall ADL's  Needs assistance/impaired  Eating/Feeding NPO  Grooming Wash/dry hands;Wash/dry face;Minimal assistance;Bed level  Upper Body Bathing Maximal assistance;Bed level  Lower Body Bathing Maximal assistance;Bed level  Upper Body Dressing  Maximal assistance;Bed level  Lower  Body Dressing Total assistance;Bed level  Toilet Transfer Total assistance  Toileting- Clothing Manipulation and Hygiene Total assistance  General ADL Comments limited due to intubation and medical issues   Vision- History  Baseline Vision/History Wears glasses  Vision- Assessment  Additional Comments Pt demonstrates full EOMs.  Difficult to fully assess due to pt with attentional deficits - weaning sedation   Perception  Perception Tested? Yes  Praxis  Praxis tested? WFL  Bed Mobility  Overal bed mobility Needs Assistance  General bed mobility comments pt transitioned to bed in chair mode, otherwise bed mobility deferred as pt remains intubated and receiving blood products at this time  Transfers  General transfer comment unable to attempt   General Comments  General comments (skin integrity, edema, etc.) VSS on vent   OT - End of Session  Equipment Utilized During Treatment Oxygen  Activity Tolerance Patient limited by pain  Patient left in bed;with call bell/phone within reach;with family/visitor present;with bed alarm set  Nurse Communication Mobility status  OT Assessment  OT Recommendation/Assessment Patient needs continued OT Services  OT Visit Diagnosis Unsteadiness on feet (R26.81);Cognitive communication deficit (R41.841)  OT Problem List Decreased strength;Decreased activity tolerance;Impaired balance (sitting and/or standing);Decreased cognition;Impaired vision/perception;Decreased knowledge of use of DME or AE;Cardiopulmonary status limiting activity;Pain  OT Plan  OT Frequency (ACUTE ONLY) Min 2X/week  OT Treatment/Interventions (ACUTE ONLY) Self-care/ADL training;Therapeutic exercise;DME and/or AE instruction;Therapeutic activities;Cognitive remediation/compensation;Patient/family education;Balance training;Neuromuscular education;Visual/perceptual remediation/compensation  AM-PAC OT "6 Clicks" Daily Activity Outcome Measure (Version 2)  Help from another person eating  meals? 1  Help from another person taking care of personal grooming? 2  Help from another person toileting, which includes using toliet, bedpan, or urinal? 1  Help from another person bathing (including washing, rinsing, drying)? 2  Help from another person to put on and taking off regular upper body clothing? 1  Help from another person to put on and taking off regular lower body clothing? 2  6 Click Score 9  OT Recommendation  Follow Up Recommendations Other (comment) (To be determined )  OT Equipment None recommended by OT  Individuals Consulted  Consulted and Agree with Results and Recommendations Family member/caregiver  Family Member Consulted daughter   Acute Rehab OT Goals  Patient Stated Goal To be able to take care of self again   OT Goal Formulation With patient/family  Time For Goal Achievement 12/15/19  Potential to Achieve Goals Good  OT Time Calculation  OT Start Time (ACUTE ONLY) 1641  OT Stop Time (ACUTE ONLY) 1657  OT Time Calculation (min) 16 min  OT General Charges  $OT Visit 1 Visit  OT Evaluation  $OT Eval High Complexity 1 High  Written Expression  Dominant Hand Right  [name]., OTR/L Acute Rehabilitation Services Pager [phone] Office [phone]

## 2019-12-08 NOTE — Evaluation (Signed)
SLP Cancellation Note  Patient Details Name: MATAEO INGWERSEN MRN: [id number] DOB: 08-23-1940   Cancelled treatment:       Reason Eval/Treat Not Completed: Other (comment);Patient not medically ready (pt remains intubated, will continue efforts)   Macario Golds 12/08/2019, 7:13 AM   Kathleen Lime, [place] Office [phone]

## 2019-12-09 DIAGNOSIS — Z8673 Personal history of transient ischemic attack (TIA), and cerebral infarction without residual deficits: Secondary | ICD-10-CM | POA: Diagnosis not present

## 2019-12-09 DIAGNOSIS — G459 Transient cerebral ischemic attack, unspecified: Secondary | ICD-10-CM | POA: Diagnosis not present

## 2019-12-09 LAB — BASIC METABOLIC PANEL
Anion gap: 10 (ref 5–15)
BUN: 56 mg/dL — ABNORMAL HIGH (ref 8–23)
CO2: 21 mmol/L — ABNORMAL LOW (ref 22–32)
Calcium: 7.7 mg/dL — ABNORMAL LOW (ref 8.9–10.3)
Chloride: 115 mmol/L — ABNORMAL HIGH (ref 98–111)
Creatinine, Ser: 1.84 mg/dL — ABNORMAL HIGH (ref 0.61–1.24)
GFR calc Af Amer: 40 mL/min — ABNORMAL LOW (ref >60)
GFR calc non Af Amer: 34 mL/min — ABNORMAL LOW (ref >60)
Glucose, Bld: 193 mg/dL — ABNORMAL HIGH (ref 70–99)
Potassium: 5.2 mmol/L — ABNORMAL HIGH (ref 3.5–5.1)
Sodium: 146 mmol/L — ABNORMAL HIGH (ref 135–145)

## 2019-12-09 LAB — CBC
HCT: 29 % — ABNORMAL LOW (ref 39.0–52.0)
Hemoglobin: 9.5 g/dL — ABNORMAL LOW (ref 13.0–17.0)
MCH: 31.4 pg (ref 26.0–34.0)
MCHC: 32.8 g/dL (ref 30.0–36.0)
MCV: 95.7 fL (ref 80.0–100.0)
Platelets: 285 10*3/uL (ref 150–400)
RBC: 3.03 MIL/uL — ABNORMAL LOW (ref 4.22–5.81)
RDW: 14.7 % (ref 11.5–15.5)
WBC: 15.8 10*3/uL — ABNORMAL HIGH (ref 4.0–10.5)
nRBC: 0 % (ref 0.0–0.2)

## 2019-12-09 LAB — BPAM RBC
Blood Product Expiration Date: 202108312359
Blood Product Expiration Date: 202108312359
ISSUE DATE / TIME: 202108060919
ISSUE DATE / TIME: 202108061532
Unit Type and Rh: 6200
Unit Type and Rh: 6200

## 2019-12-09 LAB — CREATININE, URINE, RANDOM: Creatinine, Urine: 67.28 mg/dL

## 2019-12-09 LAB — POCT I-STAT 7, (LYTES, BLD GAS, ICA,H+H)
Acid-base deficit: 2 mmol/L (ref 0.0–2.0)
Bicarbonate: 22.8 mmol/L (ref 20.0–28.0)
Calcium, Ion: 1.12 mmol/L — ABNORMAL LOW (ref 1.15–1.40)
HCT: 30 % — ABNORMAL LOW (ref 39.0–52.0)
Hemoglobin: 10.2 g/dL — ABNORMAL LOW (ref 13.0–17.0)
O2 Saturation: 94 %
Patient temperature: 97.5
Potassium: 3.8 mmol/L (ref 3.5–5.1)
Sodium: 147 mmol/L — ABNORMAL HIGH (ref 135–145)
TCO2: 24 mmol/L (ref 22–32)
pCO2 arterial: 38.6 mmHg (ref 32.0–48.0)
pH, Arterial: 7.377 (ref 7.350–7.450)
pO2, Arterial: 72 mmHg — ABNORMAL LOW (ref 83.0–108.0)

## 2019-12-09 LAB — TYPE AND SCREEN
ABO/RH(D): A POS
Antibody Screen: NEGATIVE
Unit division: 0
Unit division: 0

## 2019-12-09 LAB — GLUCOSE, CAPILLARY
Glucose-Capillary: 133 mg/dL — ABNORMAL HIGH (ref 70–99)
Glucose-Capillary: 134 mg/dL — ABNORMAL HIGH (ref 70–99)
Glucose-Capillary: 149 mg/dL — ABNORMAL HIGH (ref 70–99)
Glucose-Capillary: 164 mg/dL — ABNORMAL HIGH (ref 70–99)
Glucose-Capillary: 173 mg/dL — ABNORMAL HIGH (ref 70–99)
Glucose-Capillary: 177 mg/dL — ABNORMAL HIGH (ref 70–99)
Glucose-Capillary: 178 mg/dL — ABNORMAL HIGH (ref 70–99)
Glucose-Capillary: 209 mg/dL — ABNORMAL HIGH (ref 70–99)
Glucose-Capillary: 217 mg/dL — ABNORMAL HIGH (ref 70–99)
Glucose-Capillary: 218 mg/dL — ABNORMAL HIGH (ref 70–99)
Glucose-Capillary: 244 mg/dL — ABNORMAL HIGH (ref 70–99)

## 2019-12-09 LAB — MAGNESIUM: Magnesium: 2.4 mg/dL (ref 1.7–2.4)

## 2019-12-09 LAB — PHOSPHORUS: Phosphorus: 4.1 mg/dL (ref 2.5–4.6)

## 2019-12-09 LAB — SODIUM, URINE, RANDOM: Sodium, Ur: 63 mmol/L

## 2019-12-09 MED ORDER — INSULIN GLARGINE 100 UNIT/ML ~~LOC~~ SOLN
18.0000 [IU] | Freq: Every day | SUBCUTANEOUS | Status: DC
  Administered 2019-12-09 – 2019-12-11 (×3): 18 [IU] via SUBCUTANEOUS
  Filled 2019-12-09 (×3): qty 0.18

## 2019-12-09 MED ORDER — FREE WATER
150.0000 mL | Status: DC
  Administered 2019-12-09 – 2019-12-10 (×8): 150 mL

## 2019-12-09 MED ORDER — SODIUM ZIRCONIUM CYCLOSILICATE 10 G PO PACK
10.0000 g | PACK | Freq: Once | ORAL | Status: AC
  Administered 2019-12-09: 10 g
  Filled 2019-12-09: qty 1

## 2019-12-09 MED ORDER — FUROSEMIDE 10 MG/ML IJ SOLN
20.0000 mg | Freq: Once | INTRAMUSCULAR | Status: AC
  Administered 2019-12-09: 20 mg via INTRAVENOUS
  Filled 2019-12-09: qty 2

## 2019-12-09 MED ORDER — SODIUM ZIRCONIUM CYCLOSILICATE 10 G PO PACK
10.0000 g | PACK | Freq: Once | ORAL | Status: DC
  Filled 2019-12-09: qty 1

## 2019-12-09 MED ORDER — VITAL AF 1.2 CAL PO LIQD
1000.0000 mL | ORAL | Status: DC
  Administered 2019-12-09 – 2019-12-10 (×2): 1000 mL

## 2019-12-09 MED ORDER — METOCLOPRAMIDE HCL 5 MG/ML IJ SOLN: 10.0000 mg | Freq: Four times a day (QID) | INTRAMUSCULAR | Status: DC | PRN

## 2019-12-09 NOTE — Progress Notes (Addendum)
NAME:  Kelly Williams, MRN:  [id number], DOB:  1940-06-03, LOS: 5 ADMISSION DATE:  12/17/2019, CONSULTATION DATE:  12/31/2019 REFERRING MD:  Dr. Oneida Alar, CHIEF COMPLAINT:  Vent Management   Brief History   Kelly Williams is a 79 y/o M, admitted to [facility] for planned re-do of R CEA after CTA on prior admission revealed bilateral carotid stenosis R>L. He had a change in mental status and STAT duplex showed concern for ICA occlusion. He was taken back to the OR; however, ICA was found to be patent. He was subsequently sent for STAT CTA head which head which did not show any acute event. Returned to the ICU and PCCM was asked to assist with vent management.   Significant Hospital Events   8/3: Admitted to [facility], CEA, PCCM Consult for vent management.  8/4: Self extubated.  8/5: Re-intubated O/N 8/7: Extubated  Consults:  PCCM Stroke team  Procedures:  8/3: ETT 8/3: R CEA 8/3 Exploration of R CEA and R ICA thrombectomy 8/3: R art line   Significant Diagnostic Tests:  Carotid duplex 8/3 > right ICA occlusion. 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 Persistent narrowing and irregularity of the distal ICA with increased eccentric intraluminal filling defect, which may reflect unstable, migrated clot. 60-70% stenosis of the proximal left ICA. No proximal intracranial vessel occlusion. Stable findings of multifocal intracranial atherosclerosis.  EEG 8/3 > Severe diffuse encephalopathy, nonspecific etiology but likely related to sedation. NO seizures or epileptiform discharges seen throughout the recording. MRI brain 8/3 > Unchanged appearance of acute R MCA territory infarct. No new site of acute ischemia. Unchanged generalized atrophy and findings of chronic ischemic microangiopathy.  MRI Brain 8/7> Unchanged appearance of subacute R MCA Territory infarct.   Micro Data:  8/3> MRSA  PCR: Negative 8/5> Sputum culture: rare normal respiratory flora.   Antimicrobials:  Unasyn 8/5>>  Interim history/subjective:  Kelly Williams was seen and evaluated at bedside this AM. He is happy to be off the ventilator. We discussed continuing to self suction   Objective   Blood pressure 137/69, pulse 91, temperature 97.8 F (36.6 C), temperature source Oral, resp. rate 15, height _0  (1.676 m), weight 63.5 kg, SpO2 93 %.    Vent Mode: PSV;BIPAP FiO2 (%):  [40 %-50 %] 50 % PEEP:  [5 cmH20] 5 cmH20 Pressure Support:  [10 cmH20-14 cmH20] 14 cmH20   Intake/Output Summary (Last 24 hours) at 12/09/2019 0731 Last data filed at 12/09/2019 0600 Gross per 24 hour  Intake 2123.2 ml  Output 675 ml  Net 1448.2 ml   Filed Weights   12/05/2019 0731  Weight: 63.5 kg    Examination: Physical Exam Constitutional:      General: He is not in acute distress.    Appearance: He is not ill-appearing or toxic-appearing.     Comments: Answering questions with hand gestures and shaking and nodding his head, extubated.   HENT:     Head: Normocephalic and atraumatic.  Eyes:     General: No scleral icterus.    Conjunctiva/sclera: Conjunctivae normal.     Pupils: Pupils are equal, round, and reactive to light.  Cardiovascular:     Rate and Rhythm: Normal rate and regular rhythm.     Pulses: Normal pulses.     Heart sounds: Normal heart sounds. No murmur heard.  No friction rub. No gallop.   Pulmonary:  Breath sounds: No wheezing, rhonchi or rales.     Comments: Upper airway sounds appreciated on auscultation. Lungs CTAB.  Abdominal:     General: Abdomen is flat. Bowel sounds are normal.     Tenderness: There is no abdominal tenderness.  Neurological:     Mental Status: He is alert.     Comments: Moves all limbs sporadically.   Psychiatric:        Mood and Affect: Mood normal.     Resolved Hospital Problem list     Assessment & Plan:  Acute Respiratory Failure: Originally unable to  protect airway s/p surgical intervention prior to transfer to the ICU. Self extubated 8/4 saturating well and maintaining airway. He was re-intubated on 8/5 due to a desaturation event 2/2 to large aspiration event. Extubated on 8/7, did well ON. He did have an acute desaturation into the 70s on 8/7, he was placed on BiPap and a CXR and D-dimer were ordered. CXR shows layering and possible consolidation, and his D-dimer was elevated. His Wells score is moderate risk 1, physical examination is unremarkable, but will follow up CT angio.  - CT angio w/wo contrast.   - Continue to monitor vitals - Monitor I/O  Aspiration Pneumonia:  Patient with sharp desaturation, likely due to aspiration event on 8/5 requiring intubation. Post intubation xray showing R lower lobe infiltrate/small pleural effusion, mild temperature, rhonchi present on physical examination. No fevering events ON.  - Unasyn 3g IV  Bilateral Carotid Artery Stenosis R > L  - S/P R CEA redo on 8/1. His surgical course was complicated by change in mental status post op and concern for ICA occlusion; therefore, taken back to OR for re-exploration but found to have no occlusion. Brain MRI yesterday showed no changes compared to previous imaging.  - Post op care per vascular.  Refeeding Syndrome:  Patient with history of ETOH abuse, difficulty swallowing food at home, and failed speech eval, with labs showing concern for metabolic acidosis, with increased B-hydroxy and LA, was restarted on tube feeds with newfound hypophosphatemia and hypokalemia. Continue to follow Mg, Phos, BMP, replenish as needed. - Mg: 2.4 - Phos: 4.1 - K: 5.2 - Trend BMP, Phos, Mg  Atrial Fibrillation:  New this hospitalization likely 2/2 to R CEA. In NSR this morning at bedside.  - Holding anticoagulation 2/2 to recent procedure.   Hypertension:  Systolic 16/109 ON with Pulse of 83-104 - Holding home dose losartan - SBP goal 120-140 per neuro /  vascular  Hyperlipidemia Coronary Artery Disease: - Plavix, ASA, Lipitor  CKD: Will continue to trend Cr, has steadily climbed since starting unasyn, which is concerning for intrarenal insult, will order urine Na and Cr, check FENa. UOP continues to be sparse, patient 4L up since arrival, but appears euvolemic on physical examination holding off on lasix dosing.  - Cr increased 1.82 from 1.5.  - FU urine Na and Cr - -675 mL UOP - + 4L ON   DM: - Sugars elevated with range 160s-226 requiring 24U short acting yesterday.  - Increase Lantus to 18U. - SSI. - Hold home trulicity.  Hx EtOH use, peripheral neuropathy: - Continue home gabapentin.  Best practice:  Diet: Tube feeds. Pain/Anxiety/Delirium protocol (if indicated): None VAP protocol (if indicated): In place. DVT prophylaxis: SCD's / Heparin. GI prophylaxis: PPI. Glucose control: SSI. Mobility: Bedrest. Code Status: Full. Family Communication: Per family. Disposition: ICU  Labs   CBC: Recent Labs  Lab 12/09/2019 0837 12/05/19 0216 12/05/19 6045  12/06/19 0548 12/06/19 0739 12/07/19 0625 12/07/19 2030 12/08/19 0604 12/08/19 1657  WBC 10.5  --  11.4*  --  13.8* 13.6*  --  13.9*  --   HGB 11.5*   < > 7.4*   < > 8.4* 7.0* 9.7* 7.8* 8.9*  HCT 36.1*   < > 23.2*   < > 25.6* 21.4* 29.1* 23.7* 27.1*  MCV 95.0  --  94.7  --  94.8 94.7  --  92.9  --   PLT 354  --  241  --  262 229  --  206  --    < > = values in this interval not displayed.    Basic Metabolic Panel: Recent Labs  Lab 12/20/2019 0837 12/05/19 0216 12/05/19 0426 12/05/19 0426 12/06/19 0548 12/06/19 0739 12/06/19 1003 12/06/19 1156 12/06/19 1646 12/07/19 0625 12/07/19 2030 12/08/19 0604  NA 139   < > 140  --  143 143  --   --   --  144  --  140  K 4.4   < > 4.2  --  4.2 4.5  --   --   --  3.1*  --  3.7  CL 109  --  113*  --   --  113*  --   --   --  113*  --  115*  CO2 21*  --  17*  --   --  13*  --   --   --  19*  --  17*  GLUCOSE 116*  --   236*  --   --  209*  --   --   --  232*  --  223*  BUN 24*  --  27*  --   --  28*  --   --   --  39*  --  45*  CREATININE 1.22  --  1.32*  --   --  1.40*  --   --   --  1.60*  --  1.70*  CALCIUM 9.0  --  7.7*  --   --  7.8*  --   --   --  7.5*  --  6.8*  MG  --   --  1.8   < >  --   --  2.0  --  1.8 2.0 2.2 2.0  PHOS  --   --  4.4  --   --   --   --  3.4 2.7 2.1* 4.5  --    < > = values in this interval not displayed.   GFR: Estimated Creatinine Clearance: 31.6 mL/min (A) (by C-G formula based on SCr of 1.7 mg/dL (H)). Recent Labs  Lab 12/05/19 0426 12/06/19 0739 12/06/19 1150 12/07/19 0625 12/08/19 0604  WBC 11.4* 13.8*  --  13.6* 13.9*  LATICACIDVEN  --   --  2.7*  --   --     Liver Function Tests: Recent Labs  Lab 12/10/2019 0837  AST 34  ALT 40  ALKPHOS 82  BILITOT 0.7  PROT 7.2  ALBUMIN 3.6   No results for input(s): LIPASE, AMYLASE in the last 168 hours. No results for input(s): AMMONIA in the last 168 hours.  ABG    Component Value Date/Time   PHART 7.350 12/06/2019 0548   PCO2ART 30.3 (L) 12/06/2019 0548   PO2ART 377 (H) 12/06/2019 0548   HCO3 16.8 (L) 12/06/2019 0548   TCO2 18 (L) 12/06/2019 0548   ACIDBASEDEF 8.0 (H) 12/06/2019 0548   O2SAT 100.0  12/06/2019 0548     Coagulation Profile: Recent Labs  Lab 12/18/2019 0837  INR 1.0    Cardiac Enzymes: No results for input(s): CKTOTAL, CKMB, CKMBINDEX, TROPONINI in the last 168 hours.  HbA1C: Hemoglobin A1C  Date/Time Value Ref Range Status  06/14/2019 09:18 AM 5.8 (A) 4.0 - 5.6 % Final   HbA1c, POC (prediabetic range)  Date/Time Value Ref Range Status  01/12/2019 04:58 PM 5.9 5.7 - 6.4 % Final  10/27/2018 01:36 PM 6.0 5.7 - 6.4 % Final   Hgb A1c MFr Bld  Date/Time Value Ref Range Status  11/27/2019 03:13 AM 6.3 (H) 4.8 - 5.6 % Final    Comment:    (NOTE) Pre diabetes:          5.7%-6.4%  Diabetes:              >6.4%  Glycemic control for   <7.0% adults with diabetes     CBG: Recent  Labs  Lab 12/08/19 1154 12/08/19 1604 12/08/19 1948 12/08/19 2329 12/09/19 0327  GLUCAP 228* 169* 192* 226* 164*    Review of Systems:   Unable to ascertain as patient is intubated.   Past Medical History  He,  has a past medical history of Alcohol abuse, Alcohol abuse, Arthritis, Carotid arterial disease (HCC), Chronic kidney disease, COPD (chronic obstructive pulmonary disease) (HCC), Coronary artery disease, Diabetes mellitus without complication (HCC), Diabetic retinopathy (HCC), Hyperlipidemia, Hypertension, Normocytic anemia (01/16/2019), and Stroke (HCC).   Surgical History    Past Surgical History:  Procedure Laterality Date  . APPENDECTOMY     as teenager  . BACK SURGERY    . CAROTID ENDARTERECTOMY Right   . CORONARY ARTERY BYPASS GRAFT     10-12 years ago in [place] at [facility]  . ENDARTERECTOMY Right 12/25/2019   Procedure: EXPLORATION OF NECK, PLACEMENT OF 21f BLAKE DRAIN;  Surgeon: Chuck Hint, MD;  Location: [facility] OR;  Service: Vascular;  Laterality: Right;  . ENDARTERECTOMY Right 12/08/2019   Procedure: REDO RIGHT CAROTID ENDARTERECTOMY;  Surgeon: Sherren Kerns, MD;  Location[facility] OR;  Service: Vascular;  Laterality: Right;  . IR PATIENT EVAL TECH 0-60 MINS  11/27/2019  . PATCH ANGIOPLASTY Right 12/23/2019   Procedure: PATCH ANGIOPLASTY USING HEMASHIELD PLATINUM FINESSE CARDIOVASCULAR PATCH;  Surgeon: Sherren Kerns, MD;  Location: [facility] OR;  Service: Vascular;  Laterality: Right;  . REVERSE SHOULDER ARTHROPLASTY Right 07/18/2019   Procedure: REVERSE SHOULDER ARTHROPLASTY WITH SUBSCAPULAR REPAIR;  Surgeon: Bjorn Pippin, MD;  Location: [facility] ORS;  Service: Orthopedics;  Laterality: Right;  . THROMBECTOMY BRACHIAL ARTERY Right 12/20/2019   Procedure: THROMBECTOMY CAROTID ARTERY;  Surgeon: Chuck Hint, MD;  Location: [facility] OR;  Service: Vascular;  Laterality: Right;  [name] VASECTOMY       Social History   reports that he quit smoking about 17 years ago.  His smoking use included cigarettes. He has a 10.00 pack-year smoking history. He has never used smokeless tobacco. He reports previous alcohol use. He reports that he does not use drugs.   Family History   His family history includes Hypertension in his father and mother.   Allergies Allergies  Allergen Reactions  . Metformin And Related Diarrhea     Home Medications  Prior to Admission medications   Medication Sig Start Date End Date Taking? Authorizing Provider  albuterol (PROVENTIL HFA;VENTOLIN HFA) 108 (90 Base) MCG/ACT inhaler Inhale 1-2 puffs into the lungs every 4 (four) hours as needed for wheezing or shortness of  breath (bronchospasm). 06/20/18  Yes Trixie Dredge, PA-C  Alcohol Swabs (B-D SINGLE USE SWABS REGULAR) PADS Use to check glucose daily Patient taking differently: 1 each by Other route See admin instructions. Use to check glucose daily 09/07/19  Yes Luetta Nutting, DO  aspirin EC 325 MG EC tablet Take 1 tablet (325 mg total) by mouth daily. 11/30/19  Yes Shelly Coss, MD  atorvastatin (LIPITOR) 80 MG tablet Take 1 tablet (80 mg total) by mouth at bedtime. 11/29/19  Yes Shelly Coss, MD  Blood Glucose Monitoring Suppl (TRUE METRIX AIR GLUCOSE METER) DEVI Dx DM E11.9 Check fasting blood sugar every morning and once 2 hours after largest meal of the day. Patient taking differently: 1 each by Other route See admin instructions. Dx DM E11.9 Check fasting blood sugar every morning and once 2 hours after largest meal of the day. 09/13/19  Yes Luetta Nutting, DO  celecoxib (CELEBREX) 100 MG capsule Take 1 capsule (100 mg total) by mouth 2 (two) times daily as needed for mild pain. Patient taking differently: Take 200 mg by mouth 2 (two) times daily as needed for mild pain.  09/10/19  Yes Luetta Nutting, DO  clopidogrel (PLAVIX) 75 MG tablet Take 1 tablet (75 mg total) by mouth daily. 11/30/19  Yes Adhikari, Tamsen Meek, MD  Dulaglutide (TRULICITY) 3.42 AJ/6.8TL SOPN Inject  0.75 mg into the skin once a week. Patient taking differently: Inject 0.75 mg into the skin once a week. Saturday 09/10/19  Yes Luetta Nutting, DO  gabapentin (NEURONTIN) 300 MG capsule Take 1 capsule (300 mg total) by mouth 3 (three) times daily. 09/07/19  Yes Luetta Nutting, DO  glucose blood (TRUE METRIX BLOOD GLUCOSE TEST) test strip Use as instructed Patient taking differently: 1 each by Other route as directed.  09/07/19  Yes Luetta Nutting, DO  losartan (COZAAR) 25 MG tablet Take 1 tablet (25 mg total) by mouth every morning. Patient taking differently: Take 25 mg by mouth at bedtime.  06/25/19  Yes Buford Dresser, MD  TRUEplus Lancets 30G MISC Use to check glucose daily Patient taking differently: 1 each by Other route daily.  09/07/19  Yes Luetta Nutting, DO  acetaminophen (TYLENOL) 500 MG tablet Take 1,000 mg by mouth every 6 (six) hours as needed for mild pain.    [provider]  bismuth subsalicylate (PEPTO BISMOL) 262 MG/15ML suspension Take 30 mLs by mouth every 6 (six) hours as needed for indigestion or diarrhea or loose stools.    [provider]  ipratropium-albuterol (DUONEB) 0.5-2.5 (3) MG/3ML SOLN Take 3 mLs by nebulization every 6 (six) hours as needed. Patient taking differently: Take 3 mLs by nebulization every 6 (six) hours as needed (COPD).  10/27/18   Trixie Dredge, PA-C  loperamide (IMODIUM) 2 MG capsule Take 1 capsule (2 mg total) by mouth every 8 (eight) hours as needed for diarrhea or loose stools. 11/29/19   Shelly Coss, MD  Respiratory Therapy Supplies (NEBULIZER/TUBING/MOUTHPIECE) KIT Use as directed with nebulizer treatments (Duoneb) every 4 hours prn. Dx: J44.9 Patient taking differently: 1 each by Other route See admin instructions. Use as directed with nebulizer treatments (Duoneb) every 4 hours prn. Dx: J44.9 06/20/18   Trixie Dredge, PA-C  Tiotropium Bromide-Olodaterol (STIOLTO RESPIMAT) 2.5-2.5 MCG/ACT AERS Inhale  2 puffs into the lungs daily. Patient not taking: Reported on 11/26/2019 10/27/18   Trixie Dredge, PA-C     Critical care time:  Maudie Mercury, MD IMTS, PGY-2 Pager: [phone] 12/09/2019,10:04 AM

## 2019-12-09 NOTE — Progress Notes (Signed)
On my arrival pt complained of nausea. Administered zofran and held tube feed. Will continue to monitor.

## 2019-12-09 NOTE — Progress Notes (Addendum)
Inpatient Rehab Admissions Coordinator Note:   Per therapy recommendations, pt was screened for CIR candidacy by Farhad Burleson, MS CCC-SLP. At this time, Pt. Appears to have functional decline and is a good candidate for CIR. Will pursue order for rehab consult per protocol.  Please contact me with questions.   Clifford Benninger, MS, CCC-SLP Rehab Admissions Coordinator  [phone] (celll) [phone] (office)   

## 2019-12-09 NOTE — Progress Notes (Signed)
  Speech Language Pathology Treatment: Dysphagia  Patient Details Name: Kelly Williams MRN: [id number] DOB: 02-16-1941 Today's Date: 12/09/2019 Time: 3329-5188 SLP Time Calculation (min) (ACUTE ONLY): 8 min  Assessment / Plan / Recommendation Clinical Impression  Pt no extubated, but struggling with severe expectoration of bloody secretions. Pt is alert, following commands. Noted to have moderate left CN VII weakness and Left lingual deviation CN XII, which was present from CVA on 7/26, but worsened in severity per his daughter. Daughter also reports that pt had some mild dysphagia since his stroke, including multiple swallows wth solids in particular (pt passed the [place] last admission so swallow was not evaluated). SLP repositioned pt and provided suction to base of tongue to retrieve the blood he can only mobilize to the back of his throat, but not mouth. Two ice chip trials given with good labial seal, ability to transit and initiate swallow. Multiple, subjectively weak swallows observed, followed by further coughing and expectoration. There is currently no known cause of blood in throat (intubation, deep suction, surgery in setting of anticoagulation all possible per MD).  Pt is cognitively able to participate in MBS, but given secretions and also nausea, he would benefit from more time to recover. Will check for MBS readiness on 8/9.   HPI   Pt is a 79 year old male who suffered a R CVA on 7/26. He was readmitted for a planned redo of a R CEA on 8/3. Prior to the surgery, there was concern for a new stroke, but MRI showed prior infarct unchanged, and so R CEA was completed on 8/3. He was intubated 8/3 to 8/4 (self extubated). Was evaluated by SLP on 8/4 and found to have multiple swallows and poor secretion management. Pt was reintubated on 8/5 until 8/7. PMH: CVA, HTN, HLD, DM, CAD, COPD, CKD, arthritis, alcohol abuse. SLE 7/27 recommend OP SLP f/u for speech and memory.       SLP Plan  Continue  with current plan of care       Recommendations  Diet recommendations: NPO                Oral Care Recommendations: Oral care QID Follow up Recommendations: Inpatient Rehab SLP Visit Diagnosis: Dysphagia, oropharyngeal phase (R13.12) Plan: Continue with current plan of care       GO                Kelly Williams, Katherene Ponto 12/09/2019, 9:42 AM

## 2019-12-09 NOTE — Progress Notes (Signed)
    Subjective  - POD #5  Extubated yesterday   Physical Exam:  Good grip strength in both upper extremities.  Able to lift legs off the bed. Carotid incision is healing without hematoma. Patient is having difficulty with swallowing.       Assessment/Plan:  POD #5  The patient looks good having been extubated yesterday.  He is having issues with swallowing.  He has a CorPak in place for enteral feeds.  Hopefully this will be a transient glossopharyngeal neuropraxia which will resolve with time.  Daughter is present at the bedside as well as Dr. Erlinda Hong.  All questions were answered today.  Wells Kaylianna Detert 12/09/2019 3:53 PM --  Vitals:   12/09/19 1400 12/09/19 1500  BP: 129/81 (!) 160/78  Pulse: 94 99  Resp: 18 16  Temp:    SpO2: 99% 100%    Intake/Output Summary (Last 24 hours) at 12/09/2019 1553 Last data filed at 12/09/2019 1508 Gross per 24 hour  Intake 2049.65 ml  Output 875 ml  Net 1174.65 ml     Laboratory CBC    Component Value Date/Time   WBC 15.8 (H) 12/09/2019 0812   HGB 10.2 (L) 12/09/2019 1025   HCT 30.0 (L) 12/09/2019 1025   PLT 285 12/09/2019 0812    BMET    Component Value Date/Time   NA 147 (H) 12/09/2019 1025   K 3.8 12/09/2019 1025   CL 115 (H) 12/09/2019 0812   CO2 21 (L) 12/09/2019 0812   GLUCOSE 193 (H) 12/09/2019 0812   BUN 56 (H) 12/09/2019 0812   CREATININE 1.84 (H) 12/09/2019 0812   CREATININE 1.34 (H) 08/24/2019 1114   CALCIUM 7.7 (L) 12/09/2019 0812   GFRNONAA 34 (L) 12/09/2019 0812   GFRNONAA 50 (L) 08/24/2019 1114   GFRAA 40 (L) 12/09/2019 0812   GFRAA 58 (L) 08/24/2019 1114    COAG Lab Results  Component Value Date   INR 1.0 12/11/2019   INR 1.0 11/26/2019   No results found for: PTT  Antibiotics Anti-infectives (From admission, onward)   Start     Dose/Rate Route Frequency Ordered Stop   12/06/19 0900  Ampicillin-Sulbactam (UNASYN) 3 g in sodium chloride 0.9 % 100 mL IVPB     Discontinue     3 g 200 mL/hr over 30  Minutes Intravenous Every 6 hours 12/06/19 0820     12/31/2019 1700  ceFAZolin (ANCEF) IVPB 2g/100 mL premix        2 g 200 mL/hr over 30 Minutes Intravenous Every 8 hours 12/25/2019 1436 12/05/19 0859   12/03/2019 0743  ceFAZolin (ANCEF) IVPB 2g/100 mL premix        2 g 200 mL/hr over 30 Minutes Intravenous 30 min pre-op 12/25/2019 0743 12/07/2019 0942       V. Leia Alf, M.D., [facility] [place] Office: [phone] Pager:  [phone]

## 2019-12-09 NOTE — Progress Notes (Signed)
STROKE TEAM PROGRESS NOTE   INTERVAL HISTORY  RN, daughter and Dr. Trula Slade are at the bedside. Pt was extubated yesterday, so far tolerating fine, however, still has difficulty handling secretions, with feeling of nausea and bloody sputum coming out of suctioning. Pt still lethargic, hypernatermia and elevated Cre and leukocytosis. On unasyn.   Vitals:   12/09/19 0700 12/09/19 0757 12/09/19 0800 12/09/19 0900  BP: 137/69  (!) 148/82 (!) 144/84  Pulse: 91  91 93  Resp: 15  17 19   Temp:   (!) 97.5 F (36.4 C)   TempSrc:   Axillary   SpO2: 93% 93% 96% 99%  Weight:      Height:       CBC:  Recent Labs  Lab 12/08/19 0604 12/08/19 0604 12/08/19 1657 12/09/19 0812  WBC 13.9*  --   --  15.8*  HGB 7.8*   < > 8.9* 9.5*  HCT 23.7*   < > 27.1* 29.0*  MCV 92.9  --   --  95.7  PLT 206  --   --  285   < > = values in this interval not displayed.   Basic Metabolic Panel:  Recent Labs  Lab 12/07/19 0625 12/07/19 2030 12/08/19 0604 12/09/19 0812  NA   < >  --  140 146*  K   < >  --  3.7 5.2*  CL   < >  --  115* 115*  CO2   < >  --  17* 21*  GLUCOSE   < >  --  223* 193*  BUN   < >  --  45* 56*  CREATININE   < >  --  1.70* 1.84*  CALCIUM   < >  --  6.8* 7.7*  MG   < > 2.2 2.0 2.4  PHOS  --  4.5  --  4.1   < > = values in this interval not displayed.   Last lipids Lab Results  Component Value Date   CHOL 131 11/27/2019   HDL 40 (L) 11/27/2019   LDLCALC 76 11/27/2019   TRIG 61 12/05/2019   CHOLHDL 3.3 11/27/2019   Lab Results  Component Value Date   HGBA1C 6.3 (H) 11/27/2019    IMAGING past 24 hours DG CHEST PORT 1 VIEW  Result Date: 12/08/2019 CLINICAL DATA:  Hypoxia EXAM: PORTABLE CHEST 1 VIEW COMPARISON:  12/06/2019 FINDINGS: Interval removal of endotracheal tube. There are new, layering bilateral pleural effusions and associated atelectasis or consolidation. No focal airspace opacity. Cardiomegaly status post median sternotomy. Enteric tube is partially imaged. Status  post right shoulder reverse total arthroplasty. IMPRESSION: 1.  Interval removal of endotracheal tube. 2. There are new, layering bilateral pleural effusions and associated atelectasis or consolidation. No focal airspace opacity. 3.  Cardiomegaly status post median sternotomy. Electronically Signed   By: Eddie Candle M.D.   On: 12/08/2019 16:53    PHYSICAL EXAM  Temp:  [97.5 F (36.4 C)-99.7 F (37.6 C)] 97.5 F (36.4 C) (08/08 0800) Pulse Rate:  [79-102] 93 (08/08 0900) Resp:  [10-27] 19 (08/08 0900) BP: (102-152)/(50-84) 144/84 (08/08 0900) SpO2:  [92 %-100 %] 99 % (08/08 0900) FiO2 (%):  [50 %] 50 % (08/07 1639)  General - Well nourished, well developed, lethargic  Ophthalmologic - fundi not visualized due to noncooperation.  Cardiovascular - Regular rhythm and rate, not in afib.  Neuro - Patient lethargic but awake alert and follows all commands well. Hypophonia, difficulty with phonation and secretion handling.  Extraocular movements are full range without nystagmus, visual field full. PERRL. Mild left facial droop. Tongue midline. Right UE 4+/5 and LUE 4/5 proximal and distal with pronator drift. BLE 3/5 proximal and 4+/5 distal DF/PF.  Sensation symmetrical. FTN intact but slow on the left. Plantars are downgoing.  Gait not tested.  ASSESSMENT/PLAN [name] is a 79 y.o. male with history of diabetes, hypertension, hyperlipidemia, who suffered a mild stroke likely due to ICA stenosis last month who underwent redo carotid endarterectomy 8/3.  Following the endarterectomy, he was initially recovering well in postop, but then became unresponsive on the floor. Carotid doppler demonstrated R ICA reocclusion, however found to be open in the OR. CTA did not show LVO, but some R ICA thrombus.   Right brain TIA s/p R CEA  S/p right CEA redo 12/09/2019  Postop acute unresponsiveness  Carotid Doppler  R ICA c/w total occlusion   OR -> carotid artery exploration - patent  carotid  CTA head & neck no acute abnormality. S/p repeat R CEA w/ decreased irregularity of cervical ICA. Persisted narrowing distal ICA w/ increased filling defect, ? Clot. L ICA 60-70% stenosis.  MRI x 2 No new infarct  EEG no seizure  aspirin 325 mg daily and clopidogrel 75 mg daily prior to admission, now on aspirin 81 mg daily and clopidogrel 75 mg daily.   Therapy recommendations CIR   Disposition pending  Respiratory failure Aspiration pneumonia  Patient remained intubated after procedure  Extubated 8/7  CCM on board  Tolerating extubation OK but difficulty handling secretions  CXR new, layering bilateral pleural effusions and associated atelectasis or consolidation.  On unasyn   Leukocytosis -13.8 ->13.6->13.9->15.8  (afebrile)  History of stroke  11/2019 admitted for right MCA small infarcts.  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  EF 50 to 55%.  LDL 76, A1c 6.3.  Put on DAPT and Lipitor 80. [facility] on board recommend right CEA redo.  Carotid disease, bilateral   Hx R CEA 10 years ago and L CEA 7 years ago  S/p right CEA re-do 8/3  Left ICA 60 to 70% stenosis on CTA  [facility] following left ICA stenosis as outpatient  Atrial Fibrillation, new onset this hospitalization  CHA2DS2-VASc Score = 7  Currently NSR  Concerning for transient A. fib associated with surgery  Continue DAPT for now  Consider 30-day cardiac event monitoring as outpatient   Hypotension History of hypertension  BP on lower side likely due to propofol  Off BP meds  Close BP monitoring . Long-term BP goal 130-150  Hyperlipidemia  Home meds:  lipitor 80, resumed in hospital  LDL 76, goal < 70  Continue statin at discharge  Diabetes type II Controlled  HgbA1c 6.3, goal < 7.0  SSI  CBG monitoring  PCP follow-up  AKI on CKD IIIa Hypernatremia  Cre - 1.60->1.70->1.84  Na 144->140->146  Put on free water 150 Q4  BMP  monitoring  Dysphagia   Did not pass swallow  NPO  Difficulty handling secretions  On TF @ 50  On FW 150 Q4  Speech on board  Other Stroke Risk Factors  Advanced age  Former Cigarette smoker, quit 17 yrs ago  Chronic ETOH abuse,   Coronary artery disease w/ hx CABG x 5   Chronic systolic cardiomyopathy  Other Active Problems  Hx COPD  Peripheral neuropathy  GERD  Anemia - Hgb - 7.0->9.7->7.8->8.9->9.5  Hyperkalemia - 5.2  Hospital day # 5  This patient is critically ill due to neuro decline s/p right CEA, aspiration pneumonia, transient afib, AKI, hypernatremia, dysphagia and at significant risk of neurological worsening, death form recurrent stroke, hemorrhagic conversion, cerebral edema, heart failure, sepsis, renal failure, aspiration. This patient's care requires constant monitoring of vital signs, hemodynamics, respiratory and cardiac monitoring, review of multiple databases, neurological assessment, discussion with family, other specialists and medical decision making of high complexity. I spent 35 minutes of neurocritical care time in the care of this patient. I had long discussion with daughter and pt at bedside, updated pt current condition, treatment plan and potential prognosis, and answered all the questions. They expressed understanding and appreciation. I also discussed with Dr. Trula Slade from [facility].  Rosalin Hawking, MD PhD Stroke Neurology 12/09/2019 10:13 AM   To contact Stroke Continuity provider, please refer to [email or web address] After hours, contact General Neurology

## 2019-12-10 ENCOUNTER — Inpatient Hospital Stay (HOSPITAL_COMMUNITY): Payer: Medicare HMO

## 2019-12-10 DIAGNOSIS — G459 Transient cerebral ischemic attack, unspecified: Secondary | ICD-10-CM | POA: Diagnosis not present

## 2019-12-10 DIAGNOSIS — Z8673 Personal history of transient ischemic attack (TIA), and cerebral infarction without residual deficits: Secondary | ICD-10-CM | POA: Diagnosis not present

## 2019-12-10 DIAGNOSIS — Z978 Presence of other specified devices: Secondary | ICD-10-CM

## 2019-12-10 DIAGNOSIS — E43 Unspecified severe protein-calorie malnutrition: Secondary | ICD-10-CM | POA: Insufficient documentation

## 2019-12-10 LAB — CBC
HCT: 26.8 % — ABNORMAL LOW (ref 39.0–52.0)
Hemoglobin: 8.7 g/dL — ABNORMAL LOW (ref 13.0–17.0)
MCH: 31.1 pg (ref 26.0–34.0)
MCHC: 32.5 g/dL (ref 30.0–36.0)
MCV: 95.7 fL (ref 80.0–100.0)
Platelets: 288 10*3/uL (ref 150–400)
RBC: 2.8 MIL/uL — ABNORMAL LOW (ref 4.22–5.81)
RDW: 14.3 % (ref 11.5–15.5)
WBC: 13.9 10*3/uL — ABNORMAL HIGH (ref 4.0–10.5)
nRBC: 0 % (ref 0.0–0.2)

## 2019-12-10 LAB — GLUCOSE, CAPILLARY
Glucose-Capillary: 112 mg/dL — ABNORMAL HIGH (ref 70–99)
Glucose-Capillary: 122 mg/dL — ABNORMAL HIGH (ref 70–99)
Glucose-Capillary: 161 mg/dL — ABNORMAL HIGH (ref 70–99)
Glucose-Capillary: 173 mg/dL — ABNORMAL HIGH (ref 70–99)
Glucose-Capillary: 186 mg/dL — ABNORMAL HIGH (ref 70–99)
Glucose-Capillary: 213 mg/dL — ABNORMAL HIGH (ref 70–99)

## 2019-12-10 LAB — BASIC METABOLIC PANEL
Anion gap: 10 (ref 5–15)
BUN: 45 mg/dL — ABNORMAL HIGH (ref 8–23)
CO2: 24 mmol/L (ref 22–32)
Calcium: 7.5 mg/dL — ABNORMAL LOW (ref 8.9–10.3)
Chloride: 112 mmol/L — ABNORMAL HIGH (ref 98–111)
Creatinine, Ser: 1.52 mg/dL — ABNORMAL HIGH (ref 0.61–1.24)
GFR calc Af Amer: 50 mL/min — ABNORMAL LOW (ref >60)
GFR calc non Af Amer: 43 mL/min — ABNORMAL LOW (ref >60)
Glucose, Bld: 139 mg/dL — ABNORMAL HIGH (ref 70–99)
Potassium: 3.7 mmol/L (ref 3.5–5.1)
Sodium: 146 mmol/L — ABNORMAL HIGH (ref 135–145)

## 2019-12-10 LAB — PHOSPHORUS: Phosphorus: 3.5 mg/dL (ref 2.5–4.6)

## 2019-12-10 LAB — MAGNESIUM: Magnesium: 2 mg/dL (ref 1.7–2.4)

## 2019-12-10 MED ORDER — PROSOURCE TF PO LIQD
45.0000 mL | Freq: Two times a day (BID) | ORAL | Status: DC
  Administered 2019-12-10 – 2019-12-19 (×17): 45 mL
  Filled 2019-12-10 (×19): qty 45

## 2019-12-10 MED ORDER — JEVITY 1.2 CAL PO LIQD
1000.0000 mL | ORAL | Status: DC
  Administered 2019-12-10 – 2019-12-18 (×10): 1000 mL
  Filled 2019-12-10 (×18): qty 1000

## 2019-12-10 MED ORDER — FREE WATER
200.0000 mL | Freq: Four times a day (QID) | Status: DC
  Administered 2019-12-11 – 2019-12-13 (×7): 200 mL

## 2019-12-10 NOTE — Consult Note (Signed)
Physical Medicine and Rehabilitation Consult   Reason for Consult:Debility s/p R-CEA and recent CVA Referring Physician: Dr. Oneida Alar.    HPI: Kelly Williams is a 79 y.o. male with history of T2DM with retinopathy and neuropathy, COPD, CKD, ETOH abuse, B-CEA who was admitted on 11/26/19 with left facial droop and found to have R-MCA infarcts felt to be due to symptomatic R-ICA stenosis. He was admitted on 12/15/2019 for redo R-CEA by Dr. Oneida Alar. After transfer to the floor, he was found to have neurological changes with difficulty talking or following commands and carotid dopplers showed occluded ICA. He was taken back to OR for neck exploration by Dr. Kenton Bing was open with non-occlusive intraluminal thrombus. MRI brain  08/03 and 08/06 showed no change in R-MCA infarct.   Dr. Lorraine Lax felt that symptoms secondary to hypoperfusion and thrombus likely due to ruptured plaque.  Patient self extubated on 08/04 but he required reintubation on 08/05 due to respiratory distress due to hypoxia. He was started on Unasyn for aspiration PNA and tolerated extubation to BIPAP on 08/07 and remains NPO due to overt s/s of aspiration. Follow up labs show hypernatremia with acute on chronic renal failure as well as ABLA.  CIR recommended due to debility.    Review of Systems  Constitutional: Negative for chills and fever.  HENT: Negative for hearing loss and tinnitus.   Eyes: Negative for blurred vision and pain.  Respiratory: Positive for cough and shortness of breath. Negative for stridor.   Cardiovascular: Negative for chest pain and palpitations.  Gastrointestinal: Positive for heartburn. Negative for abdominal pain and vomiting.  Musculoskeletal: Positive for back pain and falls (tends to trip).  Skin: Negative for rash.  Neurological: Positive for sensory change (in feet), focal weakness (chronic RLE weakness with foot drop?) and weakness.     Past Medical History:  Diagnosis Date  . Alcohol abuse    . Alcohol abuse   . Arthritis   . Carotid arterial disease ([place])   . Chronic kidney disease    CKD-patient denies  . COPD (chronic obstructive pulmonary disease) (HCC)    uses inhalers  . Coronary artery disease   . Diabetes mellitus without complication ([place])   . Diabetic retinopathy ([place])   . Hyperlipidemia   . Hypertension   . Normocytic anemia 01/16/2019  . Stroke [facility])    around 2020, 2 after bypass in 2015    Past Surgical History:  Procedure Laterality Date  . APPENDECTOMY     as teenager  . BACK SURGERY    . CAROTID ENDARTERECTOMY Right   . CORONARY ARTERY BYPASS GRAFT     10-12 years ago in [place] at [facility]  . ENDARTERECTOMY Right 12/21/2019   Procedure: EXPLORATION OF NECK, PLACEMENT OF 63fBLAKE DRAIN;  Surgeon: DAngelia Mould MD;  [place] [facility] [place]  Service: Vascular;  Laterality: Right;  . ENDARTERECTOMY Right 01/01/2020   Procedure: REDO RIGHT CAROTID ENDARTERECTOMY;  Surgeon: FElam Dutch MD;  [facility]  Service: Vascular;  Laterality: Right;  . IR PATIENT EVAL TECH 0-60 MINS  11/27/2019  . PATCH ANGIOPLASTY Right 12/21/2019   Procedure: PATCH ANGIOPLASTY USING HEMASHIELD PLATINUM FINESSE CARDIOVASCULAR PATCH;  Surgeon: FElam Dutch MD;  [facility];  Service: Vascular;  Laterality: Right;  . REVERSE SHOULDER ARTHROPLASTY Right 07/18/2019   Procedure: REVERSE SHOULDER ARTHROPLASTY WITH SUBSCAPULAR REPAIR;  Surgeon: VHiram Gash MD;  Location: [facility];  Service: Orthopedics;  Laterality: Right;  .  THROMBECTOMY BRACHIAL ARTERY Right 01/01/2020   Procedure: THROMBECTOMY CAROTID ARTERY;  Surgeon: Angelia Mould, MD;  Location: [place];  Service: Vascular;  Laterality: Right;  [name] VASECTOMY      Family History  Problem Relation Age of Onset  . Hypertension Mother   . Hypertension Father     Social History:  Lives with daughter (Nurse in cardiology for [employer]). Independent with walker PTA. He reports that he quit smoking about  17 years ago. His smoking use included cigarettes. He has a 10.00 pack-year smoking history. He has never used smokeless tobacco. He reports walcohol use-- 2 beers/day. He reports that he does not use drugs.    Allergies  Allergen Reactions  . Metformin And Related Diarrhea    Medications Prior to Admission  Medication Sig Dispense Refill  . acetaminophen (TYLENOL) 500 MG tablet Take 1,000 mg by mouth every 6 (six) hours as needed for mild pain.    [name] albuterol (PROVENTIL HFA;VENTOLIN HFA) 108 (90 Base) MCG/ACT inhaler Inhale 1-2 puffs into the lungs every 4 (four) hours as needed for wheezing or shortness of breath (bronchospasm). 1 Inhaler 5  . Alcohol Swabs (B-D SINGLE USE SWABS REGULAR) PADS Use to check glucose daily (Patient taking differently: 1 each by Other route See admin instructions. Use to check glucose daily) 100 each 2  . aspirin EC 325 MG EC tablet Take 1 tablet (325 mg total) by mouth daily. 30 tablet 1  . atorvastatin (LIPITOR) 80 MG tablet Take 1 tablet (80 mg total) by mouth at bedtime. 30 tablet 1  . bismuth subsalicylate (PEPTO BISMOL) 262 MG/15ML suspension Take 30 mLs by mouth every 6 (six) hours as needed for indigestion or diarrhea or loose stools.    . Blood Glucose Monitoring Suppl (TRUE METRIX AIR GLUCOSE METER) DEVI Dx DM E11.9 Check fasting blood sugar every morning and once 2 hours after largest meal of the day. (Patient taking differently: 1 each by Other route See admin instructions. Dx DM E11.9 Check fasting blood sugar every morning and once 2 hours after largest meal of the day.) 1 each prn  . celecoxib (CELEBREX) 100 MG capsule Take 1 capsule (100 mg total) by mouth 2 (two) times daily as needed for mild pain. (Patient taking differently: Take 200 mg by mouth 2 (two) times daily as needed for mild pain. ) 90 capsule 2  . clopidogrel (PLAVIX) 75 MG tablet Take 1 tablet (75 mg total) by mouth daily. 30 tablet 1  . Dulaglutide (TRULICITY) 4.65 KP/5.4SF SOPN Inject  0.75 mg into the skin once a week. (Patient taking differently: Inject 0.75 mg into the skin once a week. Saturday) 8 pen 2  . gabapentin (NEURONTIN) 300 MG capsule Take 1 capsule (300 mg total) by mouth 3 (three) times daily. 90 capsule 3  . glucose blood (TRUE METRIX BLOOD GLUCOSE TEST) test strip Use as instructed (Patient taking differently: 1 each by Other route as directed. ) 100 each 12  . ipratropium-albuterol (DUONEB) 0.5-2.5 (3) MG/3ML SOLN Take 3 mLs by nebulization every 6 (six) hours as needed. (Patient taking differently: Take 3 mLs by nebulization every 6 (six) hours as needed (COPD). ) 360 mL 5  . loperamide (IMODIUM) 2 MG capsule Take 1 capsule (2 mg total) by mouth every 8 (eight) hours as needed for diarrhea or loose stools. 30 capsule 0  . losartan (COZAAR) 25 MG tablet Take 1 tablet (25 mg total) by mouth every morning. (Patient taking differently: Take 25 mg by  mouth at bedtime. ) 90 tablet 3  . Respiratory Therapy Supplies (NEBULIZER/TUBING/MOUTHPIECE) KIT Use as directed with nebulizer treatments (Duoneb) every 4 hours prn. Dx: J44.9 (Patient taking differently: 1 each by Other route See admin instructions. Use as directed with nebulizer treatments (Duoneb) every 4 hours prn. Dx: J44.9) 1 each 5  . TRUEplus Lancets 30G MISC Use to check glucose daily (Patient taking differently: 1 each by Other route daily. ) 100 each 2  . Tiotropium Bromide-Olodaterol (STIOLTO RESPIMAT) 2.5-2.5 MCG/ACT AERS Inhale 2 puffs into the lungs daily. (Patient not taking: Reported on 11/26/2019) 12 g 3    Home: Home Living Family/patient expects to be discharged to:: Private residence Living Arrangements: Children Available Help at Discharge: Family, Available PRN/intermittently Type of Home: Apartment Home Access: Stairs to enter [employer] of Steps: 2 flights Entrance Stairs-Rails: Right Home Layout: One level Bathroom Shower/Tub: Chiropodist: Standard Home  Equipment: Environmental consultant - 4 wheels, [employer] - single point, Civil engineer, contracting, Environmental consultant - 2 wheels Additional Comments: daugher works during the day, pt alone 7am -5pm  Lives With: Daughter  Functional History: Prior Function Level of Independence: Independent with assistive device(s) Comments: pt has been utilizing RW since recent admission Functional Status:  Mobility: Bed Mobility Overal bed mobility: Needs Assistance Bed Mobility: Supine to Sit Supine to sit: Mod assist Sit to supine: Mod assist General bed mobility comments: Mod assist to support trunk to come to sitting and completely move bil LEs to EOB. assist to lift both legs back into bed when returning to supine.  Transfers Overall transfer level: Needs assistance Equipment used: 1 person hand held assist Transfers: Sit to/from Stand Sit to Stand: Mod assist General transfer comment: Stood x 2 from EOB with mod assist, feet blocked to prevent anterior slippage, unable to side step.      ADL: ADL Overall ADL's : Needs assistance/impaired Eating/Feeding: NPO Grooming: Wash/dry hands, Wash/dry face, Minimal assistance, Bed level Upper Body Bathing: Maximal assistance, Bed level Lower Body Bathing: Maximal assistance, Bed level Upper Body Dressing : Maximal assistance, Bed level Lower Body Dressing: Total assistance, Bed level Toilet Transfer: Total assistance Toileting- Clothing Manipulation and Hygiene: Total assistance General ADL Comments: limited due to intubation and medical issues   Cognition: Cognition Overall Cognitive Status: Impaired/Different from baseline Orientation Level: Oriented to person, Oriented to place, Oriented to time, Disoriented to situation Cognition Arousal/Alertness: Awake/alert Behavior During Therapy: WFL for tasks assessed/performed Overall Cognitive Status: Impaired/Different from baseline Area of Impairment: Problem solving Current Attention Level: Sustained Following Commands: Follows one step  commands with increased time Problem Solving: Slow processing General Comments: Pt slow to process, lethargic, however RN reports she just bathed him and gave him morphine.  Difficult to assess due to: Intubated   Blood pressure (!) 148/75, pulse (!) 102, temperature 98.1 F (36.7 C), temperature source Oral, resp. rate 18, height _0  (1.676 m), weight 62.9 kg, SpO2 98 %. Physical Exam Vitals and nursing note reviewed.  Neck:     Comments: Right lateral neck incision with skin glue.  Pulmonary:     Comments: Intermittent wet cough noted.  Musculoskeletal:        General: No swelling.  Neurological:     Mental Status: He is alert and oriented to person, place, and time.     Comments: Soft voice with mild dysarthria. Occasional wet cough. Mild left hemiparesis and proximal LE weakness noted.      Results for orders placed or performed during the hospital  encounter of 12/23/2019 (from the past 24 hour(s))  Glucose, capillary     Status: Abnormal   Collection Time: 12/09/19  3:31 PM  Result Value Ref Range   Glucose-Capillary 149 (H) 70 - 99 mg/dL  Glucose, capillary     Status: Abnormal   Collection Time: 12/09/19  8:01 PM  Result Value Ref Range   Glucose-Capillary 133 (H) 70 - 99 mg/dL  Glucose, capillary     Status: Abnormal   Collection Time: 12/09/19 11:49 PM  Result Value Ref Range   Glucose-Capillary 134 (H) 70 - 99 mg/dL  Glucose, capillary     Status: Abnormal   Collection Time: 12/10/19  3:43 AM  Result Value Ref Range   Glucose-Capillary 122 (H) 70 - 99 mg/dL  CBC     Status: Abnormal   Collection Time: 12/10/19  3:59 AM  Result Value Ref Range   WBC 13.9 (H) 4.0 - 10.5 K/uL   RBC 2.80 (L) 4.22 - 5.81 MIL/uL   Hemoglobin 8.7 (L) 13.0 - 17.0 g/dL   HCT 26.8 (L) 39 - 52 %   MCV 95.7 80.0 - 100.0 fL   MCH 31.1 26.0 - 34.0 pg   MCHC 32.5 30.0 - 36.0 g/dL   RDW 14.3 11.5 - 15.5 %   Platelets 288 150 - 400 K/uL   nRBC 0.0 0.0 - 0.2 %  Basic metabolic panel      Status: Abnormal   Collection Time: 12/10/19  3:59 AM  Result Value Ref Range   Sodium 146 (H) 135 - 145 mmol/L   Potassium 3.7 3.5 - 5.1 mmol/L   Chloride 112 (H) 98 - 111 mmol/L   CO2 24 22 - 32 mmol/L   Glucose, Bld 139 (H) 70 - 99 mg/dL   BUN 45 (H) 8 - 23 mg/dL   Creatinine, Ser 1.52 (H) 0.61 - 1.24 mg/dL   Calcium 7.5 (L) 8.9 - 10.3 mg/dL   GFR calc non Af Amer 43 (L) >60 mL/min   GFR calc Af Amer 50 (L) >60 mL/min   Anion gap 10 5 - 15  Glucose, capillary     Status: Abnormal   Collection Time: 12/10/19  7:30 AM  Result Value Ref Range   Glucose-Capillary 112 (H) 70 - 99 mg/dL  Glucose, capillary     Status: Abnormal   Collection Time: 12/10/19 11:35 AM  Result Value Ref Range   Glucose-Capillary 173 (H) 70 - 99 mg/dL   DG CHEST PORT 1 VIEW  Result Date: 12/10/2019 CLINICAL DATA:  Pleural effusion. EXAM: PORTABLE CHEST 1 VIEW COMPARISON:  12/08/2019 FINDINGS: The feeding tube is stable. Stable mild cardiac enlargement. Fairly extensive perihilar vascular congestion and pulmonary edema. Persistent bilateral pleural effusions and bibasilar atelectasis. IMPRESSION: Persistent CHF with bilateral pleural effusions and bibasilar atelectasis. Electronically Signed   By: Marijo Sanes M.D.   On: 12/10/2019 10:07   DG CHEST PORT 1 VIEW  Result Date: 12/08/2019 CLINICAL DATA:  Hypoxia EXAM: PORTABLE CHEST 1 VIEW COMPARISON:  12/06/2019 FINDINGS: Interval removal of endotracheal tube. There are new, layering bilateral pleural effusions and associated atelectasis or consolidation. No focal airspace opacity. Cardiomegaly status post median sternotomy. Enteric tube is partially imaged. Status post right shoulder reverse total arthroplasty. IMPRESSION: 1.  Interval removal of endotracheal tube. 2. There are new, layering bilateral pleural effusions and associated atelectasis or consolidation. No focal airspace opacity. 3.  Cardiomegaly status post median sternotomy. Electronically Signed   By:  Dorna Bloom.D.  On: 12/08/2019 16:53     Assessment/Plan: Diagnosis: Right MCA infarcts d/t ICA stenosis s/p re-do CEA, additional debility secondary to hospital course 1. Does the need for close, 24 hr/day medical supervision in concert with the patient's rehab needs make it unreasonable for this patient to be served in a less intensive setting? Yes 2. Co-Morbidities requiring supervision/potential complications: DM with retinopathy and neuropathy, COPD, CKD, ETOH abuse 3. Due to bladder management, bowel management, safety, skin/wound care, disease management, medication administration, pain management and patient education, does the patient require 24 hr/day rehab nursing? Yes 4. Does the patient require coordinated care of a physician, rehab nurse, therapy disciplines of PT, OT, ?SLP to address physical and functional deficits in the context of the above medical diagnosis(es)? Yes Addressing deficits in the following areas: balance, endurance, locomotion, strength, transferring, bowel/bladder control, bathing, dressing, feeding, grooming, toileting and psychosocial support 5. Can the patient actively participate in an intensive therapy program of at least 3 hrs of therapy per day at least 5 days per week? Yes 6. The potential for patient to make measurable gains while on inpatient rehab is excellent 7. Anticipated functional outcomes upon discharge from inpatient rehab are supervision  with PT, supervision and min assist with OT, n/a with SLP. 8. Estimated rehab length of stay to reach the above functional goals is: 15-20 days 9. Anticipated discharge destination: Home 10. Overall Rehab/Functional Prognosis: excellent  RECOMMENDATIONS: This patient's condition is appropriate for continued rehabilitative care in the following setting: CIR Patient has agreed to participate in recommended program. Yes Note that insurance prior authorization may be required for reimbursement for recommended  care.  Comment: Rehab Admissions Coordinator to follow up.  Thanks,  Meredith Staggers, MD, Mellody Drown  I have personally performed a face to face diagnostic evaluation of this patient. Additionally, I have examined pertinent labs and radiographic images. I have reviewed and concur with the physician assistant's documentation above.    Bary Leriche, PA-C 12/10/2019

## 2019-12-10 NOTE — Progress Notes (Addendum)
NAME:  Kelly Williams, MRN:  [id number], DOB:  1940/11/11, LOS: 6 ADMISSION DATE:  12/09/2019, CONSULTATION DATE:  12/20/2019 REFERRING MD:  Dr. Oneida Alar, CHIEF COMPLAINT:  Vent Management   Brief History   Mr. Dealmeida is a 79 y/o M, admitted to [facility] for planned re-do of R CEA after CTA on prior admission revealed bilateral carotid stenosis R>L. He had a change in mental status and STAT duplex showed concern for ICA occlusion. He was taken back to the OR; however, ICA was found to be patent. He was subsequently sent for STAT CTA head which head which did not show any acute event. Returned to the ICU and PCCM was asked to assist with vent management.    Past Medical History  Hypertension Hyperlipidemia Diabetes COPD CKD Chronic alcohol abuse CAD status post CABG in 2020  [facility]   8/3: Admitted to [facility], CEA, PCCM Consult for vent management.  8/4: Self extubated.  8/5: Re-intubated O/N 8/7: Extubated  Consults:  PCCM Neurology  Procedures:  8/3: ETT 8/3: R CEA 8/3 Exploration of R CEA and R ICA thrombectomy 8/3: R art line   Significant Diagnostic Tests:  Carotid duplex 8/3 > right ICA occlusion. 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 Persistent narrowing and irregularity of the distal ICA with increased eccentric intraluminal filling defect, which may reflect unstable, migrated clot. 60-70% stenosis of the proximal left ICA. No proximal intracranial vessel occlusion. Stable findings of multifocal intracranial atherosclerosis.  EEG 8/3 >Severe diffuse encephalopathy, nonspecific etiology but likely related to sedation. NO seizures or epileptiform discharges seen throughout the recording. MRI brain 8/3 > Unchanged appearance of acute R MCA territory infarct. No new site of acute ischemia. Unchanged generalized atrophy and findings of chronic  ischemic microangiopathy.  MRI Brain 8/7> Unchanged appearance of subacute R MCA Territory infarct.   Micro Data:  8/3> MRSA PCR: Negative 8/5> Sputum culture: rare normal respiratory flora.   Antimicrobials:  Unasyn 8/5>>  Interim history/subjective:  Patient is seen at bedside today.  He is pleasant and in no acute distress.  He states that his breathing is well and he has no shortness of breath.  He complains of headache.  Otherwise  no other issue  Objective   Blood pressure (!) 156/70, pulse 94, temperature 99.2 F (37.3 C), temperature source Oral, resp. rate 11, height 5\' 6"  (1.676 m), weight 62.9 kg, SpO2 95 %.        Intake/Output Summary (Last 24 hours) at 12/10/2019 0906 Last data filed at 12/10/2019 0700 Gross per 24 hour  Intake 1727.5 ml  Output 1675 ml  Net 52.5 ml   Filed Weights   12/05/2019 0731 12/10/19 0400  Weight: 63.5 kg 62.9 kg    Examination: Physical Exam Constitutional:      General: He is not in acute distress.    Appearance: He is not toxic-appearing.  HENT:     Head: Normocephalic.  Eyes:     General: No scleral icterus.       Right eye: No discharge.        Left eye: No discharge.     Conjunctiva/sclera: Conjunctivae normal.     Pupils: Pupils are equal, round, and reactive to light.     Comments: Limited eye movement when looking to the right side  Cardiovascular:     Rate and Rhythm: Regular rhythm. Tachycardia present.  Heart sounds: No murmur heard.   Pulmonary:     Effort: Pulmonary effort is normal. No respiratory distress.  Musculoskeletal:     Cervical back: Normal range of motion.     Right lower leg: No edema.     Left lower leg: No edema.  Skin:    General: Skin is warm.     Coloration: Skin is not jaundiced.  Neurological:     Mental Status: He is alert.     Comments: PERRLA Normal sensations bilaterally Weak facial muscle on the left side compared to the right Left upper extremity: Strength 2/4 with elbow  extension and flexion Right upper extremities: Strength 2-3 out of 4 with elbow extension and flexion Left lower extremity: Strength 3/4 with hip flexion Right lower extremity: strength 2/4 hip flexion      Resolved Hospital Problem list     Assessment & Plan:  Acute respiratory failure requiring BiPAP for airway protection Aspiration pneumonia Bilateral carotid artery stenosis status post endarterectomy Refeeding syndrome CKD Atrial fibrillation Hypertension Hyperlipidemia CAD status post CABG Peripheral neuropathy  Plan:  Acute respiratory failure requiring BiPAP for airway protection Patient was intubated for airway protection for procedure.  He was extubated on 8/7 and placed on BiPAP for an event of acute desaturation into the 70s.  Pulmonary embolism was a concern given elevated D-dimer but CTA was held due to low Wells score and worsening renal function.  Patient is currently doing well on 3 L of nasal cannula and satting at 98% -Wean oxygen as tolerated -Continue nasal tube suctioning and pulmonary toilet -May consider transfer patient to stepdown unit if he continues to do well   Aspiration pneumonia Bilateral pleural effusion Chest x-ray on 8/5 post intubation shows developing infiltrations or atelectasis in the right lung base and small right pleural effusion concerning for aspiration pneumonia.  Unasyn was started.  Patient is afebrile.  His leukocytosis also improving from 19-15.  Chest x-ray on 8/7 shows worsening bilateral effusions.  Patient had one-time Lasix for diuresis.  Pending chest x-ray today, if worsening pleural effusion will consider 1 more dose of Lasix.  Patient had good urine output yesterday of 1600 cc.  Creatinine is slowly improving, today 1.52 -Continue Unasyn, duration of 7 days (5/7) -Pending chest x-ray   AKI on CKD Hyperkalemia Creatinine slowly improving from 1.8-1.5 today.  Baseline creatinine of 1.23.  Patient also have good urine  output of 1600 cc.  FeNa of 1% which indicates prerenal.  This is likely secondary to low p.o. intake and dehydration.  Continue to trend creatinine. -BMP daily -Potassium of 3.7 today.  Potassium chloride as needed if low potassium level   Refeeding syndrome Given history of alcohol abuse and difficult swallowing food at home, monitor for refeeding syndrome.  Monitor mag and Phos and potassium daily.  Replete as needed -BMP, mag, Phos in a.m.   Atrial fibrillation Patient was found to be in A. fib on 8/7 which is a new diagnosis for him. His Chads score is 6.  Consider starting anticoagulation depends on patient's  status and goal of care. -Heparin for DVT prophylaxis   Hypertension -Continue home dose losartan   Hyperlipidemia CAD status post CABG -Continue Plavix, ASA, Lipitor   Diabetes CBG within good range today -Continue Lantus 18 units daily -SSI -Monitor CBG  Best practice:  Diet:Tube feeds. Pain/Anxiety/Delirium protocol (if indicated):None VAP protocol (if indicated):In place. DVT prophylaxis:SCD's/ Heparin. GI prophylaxis:PPI. Glucose control:SSI. Mobility:Bedrest. Code Status:Full. Family Communication:Per family. Disposition:ICU  Critical care time: 20 min     Gaylan Gerold, DO Internal Medicine Residency My pager: [phone]

## 2019-12-10 NOTE — Progress Notes (Signed)
STROKE TEAM PROGRESS NOTE   INTERVAL HISTORY  No family is at the bedside. Pt sitting in bed, awake alert, less lethargic than yesterday.  Speech louder than yesterday.  Neurologically stable.  Still has difficulty handling oral secretion but better than yesterday.  Vitals:   12/10/19 1500 12/10/19 1600 12/10/19 1700 12/10/19 1800  BP: 134/74 (!) 143/95 130/64 (!) 148/71  Pulse: (!) 102 (!) 106 95 100  Resp: (!) 21 (!) 22 14 16   Temp:  98.2 F (36.8 C)    TempSrc:  Oral    SpO2: 98% 94% 99% 98%  Weight:      Height:       CBC:  Recent Labs  Lab 12/09/19 0812 12/09/19 0812 12/09/19 1025 12/10/19 0359  WBC 15.8*  --   --  13.9*  HGB 9.5*   < > 10.2* 8.7*  HCT 29.0*   < > 30.0* 26.8*  MCV 95.7  --   --  95.7  PLT 285  --   --  288   < > = values in this interval not displayed.   Basic Metabolic Panel:  Recent Labs  Lab 12/09/19 0812 12/09/19 0812 12/09/19 1025 12/10/19 0359  NA 146*   < > 147* 146*  K 5.2*   < > 3.8 3.7  CL 115*  --   --  112*  CO2 21*  --   --  24  GLUCOSE 193*  --   --  139*  BUN 56*  --   --  45*  CREATININE 1.84*  --   --  1.52*  CALCIUM 7.7*  --   --  7.5*  MG 2.4  --   --  2.0  PHOS 4.1  --   --  3.5   < > = values in this interval not displayed.   Last lipids Lab Results  Component Value Date   CHOL 131 11/27/2019   HDL 40 (L) 11/27/2019   LDLCALC 76 11/27/2019   TRIG 61 12/05/2019   CHOLHDL 3.3 11/27/2019   Lab Results  Component Value Date   HGBA1C 6.3 (H) 11/27/2019    IMAGING past 24 hours DG CHEST PORT 1 VIEW  Result Date: 12/10/2019 CLINICAL DATA:  Pleural effusion. EXAM: PORTABLE CHEST 1 VIEW COMPARISON:  12/08/2019 FINDINGS: The feeding tube is stable. Stable mild cardiac enlargement. Fairly extensive perihilar vascular congestion and pulmonary edema. Persistent bilateral pleural effusions and bibasilar atelectasis. IMPRESSION: Persistent CHF with bilateral pleural effusions and bibasilar atelectasis. Electronically Signed    By: Marijo Sanes M.D.   On: 12/10/2019 10:07    PHYSICAL EXAM  Temp:  [98.1 F (36.7 C)-99.7 F (37.6 C)] 98.2 F (36.8 C) (08/09 1600) Pulse Rate:  [88-106] 100 (08/09 1800) Resp:  [10-24] 16 (08/09 1800) BP: (110-162)/(62-108) 148/71 (08/09 1800) SpO2:  [94 %-100 %] 98 % (08/09 1800) Weight:  [62.9 kg] 62.9 kg (08/09 0400)  General - Well nourished, well developed, mildly lethargic, improved from yesterday  Ophthalmologic - fundi not visualized due to noncooperation.  Cardiovascular - Regular rhythm and rate, not in afib.  Neuro - Patient lethargic improved from yesterday, awake alert and follows all commands well. Hypophon also improved, mild to moderate dysarthria. Extraocular movements are full range without nystagmus, visual field full. PERRL. Mild left facial droop. Tongue midline. Right UE 5-/5 and LUE 4/5 proximal and distal with pronator drift. BLE 3/5 proximal and 4+/5 distal DF/PF.  Sensation symmetrical. FTN intact but slow on the left. Plantars  are downgoing.  Gait not tested.  ASSESSMENT/PLAN [name] is a 79 y.o. male with history of diabetes, hypertension, hyperlipidemia, who suffered a mild stroke likely due to ICA stenosis last month who underwent redo carotid endarterectomy 8/3.  Following the endarterectomy, he was initially recovering well in postop, but then became unresponsive on the floor. Carotid doppler demonstrated R ICA reocclusion, however found to be open in the OR. CTA did not show LVO, but some R ICA thrombus.   Right brain TIA s/p R CEA  S/p right CEA redo 12/09/2019  Postop acute unresponsiveness  Carotid Doppler  R ICA c/w total occlusion   OR -> carotid artery exploration - patent carotid  CTA head & neck no acute abnormality. S/p repeat R CEA w/ decreased irregularity of cervical ICA. Persisted narrowing distal ICA w/ increased filling defect, ? Clot. L ICA 60-70% stenosis.  MRI x 2 No new infarct  EEG no seizure  aspirin 325  mg daily and clopidogrel 75 mg daily prior to admission, now on aspirin 81 mg daily and clopidogrel 75 mg daily.   Therapy recommendations CIR   Disposition pending  Respiratory failure Aspiration pneumonia  Patient remained intubated after procedure -> Extubated 8/7  Difficulty handling secretions, improved  CXR Persistent CHF with bilateral pleural effusions and bibasilar atelectasis..  Continued on unasyn   Leukocytosis -13.8 ->13.6->13.9->15.8->13.9  (afebrile)  History of stroke  11/2019 admitted for right MCA small infarcts.  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  EF 50 to 55%.  LDL 76, A1c 6.3.  Put on DAPT and Lipitor 80. VVS on board recommend right CEA redo.  Carotid disease, bilateral   Hx R CEA 10 years ago and L CEA 7 years ago  S/p right CEA re-do 8/3  Left ICA 60 to 70% stenosis on CTA  VVS following left ICA stenosis as outpatient  Atrial Fibrillation, new onset this hospitalization  CHA2DS2-VASc Score = 7  Currently NSR  Concerning for transient A. fib associated with surgery  Continue DAPT for now  Consider 30-day cardiac event monitoring as outpatient   Hypotension History of hypertension  BP on lower side likely due to propofol  Off BP meds  BP stable now at goal . Long-term BP goal 130-150  Hyperlipidemia  Home meds:  lipitor 80, resumed in hospital  LDL 76, goal < 70  Continue statin at discharge  Diabetes type II Controlled  HgbA1c 6.3, goal < 7.0  SSI  CBG monitoring  PCP follow-up  AKI on CKD IIIa Hypernatremia  Cre - 1.60->1.70->1.84->1.52  Na 144->140->146->146  Put on free water 150 Q4 -> 200 Q6  BMP monitoring  Dysphagia   Did not pass swallow  NPO  Difficulty handling secretions, improving  On TF @ 20  On FW 200 Q6  Speech on board  Other Stroke Risk Factors  Advanced age  Former Cigarette smoker, quit 17 yrs ago  Chronic ETOH abuse,   Coronary  artery disease w/ hx CABG x 5   Chronic systolic cardiomyopathy  Other Active Problems  Hx COPD  Peripheral neuropathy  GERD  Anemia - Hgb - 7.0->9.7->7.8->8.9->9.5->8.7  Hyperkalemia - 5.2->3.7   Hospital day # 6  This patient is critically ill due to right carotid stenosis status post CEA, TIA, history of stroke, respiratory failure needing ventilation, aspiration pneumonia, hypotension, AKI on CKD, dysphagia and at significant risk of neurological worsening, death form recurrent stroke, hemorrhagic conversion, seizure, sepsis,  septic shock, renal failure. This patient's care requires constant monitoring of vital signs, hemodynamics, respiratory and cardiac monitoring, review of multiple databases, neurological assessment, discussion with family, other specialists and medical decision making of high complexity. I spent 35 minutes of neurocritical care time in the care of this patient.  Rosalin Hawking, MD PhD Stroke Neurology 12/10/2019 7:34 PM   To contact Stroke Continuity provider, please refer to [email or web address] After hours, contact General Neurology

## 2019-12-10 NOTE — Progress Notes (Signed)
Pt arrived from unit from ICU. BP 159/82 (104) RR 21 SpO2 96 NC3L HR 107 . Suction available to pt.. Pt sounds very congested with thick secretion  Will continue to monitor.    Phoebe Sharps, RN

## 2019-12-10 NOTE — Progress Notes (Signed)
  Speech Language Pathology Treatment: Dysphagia  Patient Details Name: Kelly Williams MRN: [id number] DOB: [birth year] Today's Date: 12/10/2019 Time: 6073-7106 SLP Time Calculation (min) (ACUTE ONLY): 29 min  Assessment / Plan / Recommendation Clinical Impression  Pt completed his own oral care with assist, brushing his teeth/tongue with suctioning provided by SLP.  Continued to cough/expectorate dried blood-tinged secretions.  Pt with low-volume phonation, weak cough. After oral care, provided with trials of ice chips and teaspoons of water, all of which led to consistent wet, weak coughing and further expectoration of secretions.  Pt self-suctioned for retrieval. Required verbal cues for effortful swallow throughout session. Continues to demonstrate overt s/s of aspiration with all trials.  Recommend continued NPO for now, occasional ice chips (3-4 ice chips after oral care) offered throughout the day to facilitate/mobilize swallowing musculature. SLP will follow for readiness for MBS.      HPI HPI: Pt is a 79 year old male who suffered a R CVA on 7/26. He was readmitted for a planned redo of a R CEA on 8/3. Prior to the surgery, there was concern for a new stroke, but MRI showed prior infarct unchanged, and so R CEA was completed on 8/3. He was intubated 8/3 to 8/4 (self extubated). Was evaluated by SLP on 8/4 and found to have multiple swallows and poor secretion management. Pt was reintubated on 8/5 until 8/7. PMH: CVA, HTN, HLD, DM, CAD, COPD, CKD, arthritis, alcohol abuse. SLE 7/27 recommend OP SLP f/u for speech and memory.       SLP Plan  Continue with current plan of care       Recommendations  Diet recommendations: NPO Medication Administration: Via alternative means                Oral Care Recommendations: Oral care QID Follow up Recommendations: Inpatient Rehab SLP Visit Diagnosis: Dysphagia, oropharyngeal phase (R13.12) Plan: Continue with current plan of care        GO                Juan Quam Laurice 12/10/2019, 11:06 AM  Estill Bamberg L. Tivis Ringer, [place] Office number [phone] Pager [phone]

## 2019-12-10 NOTE — Progress Notes (Addendum)
Progress Note    12/10/2019 7:21 AM 6 Days Post-Op  Subjective:  Asks if he is improving and if he can shave. C/o incisional pain   Vitals:   12/10/19 0600 12/10/19 0700  BP: (!) 145/70 (!) 154/78  Pulse: 88 94  Resp: 13 10  Temp:    SpO2: 97% 95%    Physical Exam: Cardiac:  RRR Lungs:  Few rales. Loose cough Incisions:  Well approximated.  Ecchymosis. Soft Extremities:  Moves all well with 5/5 upper and lower extremity strength Abdomen:  Soft, ND Neuro: Facial droop. Tongue midline. Speech fluent  CBC    Component Value Date/Time   WBC 13.9 (H) 12/10/2019 0359   RBC 2.80 (L) 12/10/2019 0359   HGB 8.7 (L) 12/10/2019 0359   HCT 26.8 (L) 12/10/2019 0359   PLT 288 12/10/2019 0359   MCV 95.7 12/10/2019 0359   MCH 31.1 12/10/2019 0359   MCHC 32.5 12/10/2019 0359   RDW 14.3 12/10/2019 0359   LYMPHSABS 3.4 11/26/2019 0004   MONOABS 0.9 11/26/2019 0004   EOSABS 0.3 11/26/2019 0004   BASOSABS 0.1 11/26/2019 0004    BMET    Component Value Date/Time   NA 146 (H) 12/10/2019 0359   K 3.7 12/10/2019 0359   CL 112 (H) 12/10/2019 0359   CO2 24 12/10/2019 0359   GLUCOSE 139 (H) 12/10/2019 0359   BUN 45 (H) 12/10/2019 0359   CREATININE 1.52 (H) 12/10/2019 0359   CREATININE 1.34 (H) 08/24/2019 1114   CALCIUM 7.5 (L) 12/10/2019 0359   GFRNONAA 43 (L) 12/10/2019 0359   GFRNONAA 50 (L) 08/24/2019 1114   GFRAA 50 (L) 12/10/2019 0359   GFRAA 58 (L) 08/24/2019 1114     Intake/Output Summary (Last 24 hours) at 12/10/2019 0721 Last data filed at 12/10/2019 0700 Gross per 24 hour  Intake 1810.92 ml  Output 1675 ml  Net 135.92 ml    HOSPITAL MEDICATIONS Scheduled Meds: . aspirin  81 mg Per Tube Daily  . atorvastatin  80 mg Per Tube QHS  . bethanechol  10 mg Per Tube TID  . chlorhexidine gluconate (MEDLINE KIT)  15 mL Mouth Rinse BID  . Chlorhexidine Gluconate Cloth  6 each Topical Q0600  . clopidogrel  75 mg Per Tube Daily  . free water  150 mL Per Tube Q4H  .  gabapentin  300 mg Per Tube TID  . insulin aspart  0-15 Units Subcutaneous Q4H  . insulin glargine  18 Units Subcutaneous Daily  . ipratropium-albuterol  3 mL Nebulization BID  . mouth rinse  15 mL Mouth Rinse q12n4p  . multivitamin with minerals  1 tablet Per Tube Daily  . pantoprazole sodium  40 mg Per Tube QHS   Continuous Infusions: . sodium chloride Stopped (12/08/19 2057)  . ampicillin-sulbactam (UNASYN) IV Stopped (12/10/19 0326)  . feeding supplement (VITAL AF 1.2 CAL) 50 mL/hr at 12/10/19 0700  . magnesium sulfate bolus IVPB    . niCARDipine     PRN Meds:.sodium chloride, acetaminophen **OR** acetaminophen, alum & mag hydroxide-simeth, bismuth subsalicylate, docusate, fentaNYL (SUBLIMAZE) injection, fentaNYL (SUBLIMAZE) injection, guaiFENesin-dextromethorphan, ipratropium-albuterol, loperamide, loperamide HCl, magnesium sulfate bolus IVPB, metoCLOPramide (REGLAN) injection, metoprolol tartrate, morphine injection, oxyCODONE-acetaminophen, phenol, polyethylene glycol, potassium chloride  Assessment:     s/p right CEA re-do with hypoxia post-op requiring intubation. Now extubated. 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 Tolerating TFs  -On Unasyn for aspiration pneumonia -Plavix and aspirin continue -Acute blood loss anemia>>s/p 2 u PRBCs on Friday with Hgb rise to  10.2. Drift to 8.7 this morning -Creatinine 1.5 with good UOP  Plan: -Continue supportive care; PT/OT/ST -DVT prophylaxis:  SCDs   Risa Grill, PA-C [facility] [phone] 12/10/2019  7:21 AM  .  Agree with above.  Neck incision healing.  Slowly improving.  Deconditioned and would benefit from rehab.   His speech is still slurred but pt thinks it may be slightly better than preop Hopefully swallowing will improve in a few weeks.  Ruta Hinds, MD [facility] [place] Office: [phone]

## 2019-12-10 NOTE — Progress Notes (Signed)
Patient is off the ventilator and is doing well on 3 L of nasal cannula.  PCCM will sign off to [facility] hospitalist for medical management.  Vascular surgery is still the primary team.  Gaylan Gerold, DO Internal Medicine Residency My pager: [phone]

## 2019-12-10 NOTE — Progress Notes (Signed)
Nutrition Follow-up  DOCUMENTATION CODES:   Severe malnutrition in context of chronic illness  INTERVENTION:   Tube feeding via Cortrak: Jevity 1.2 at 65 ml/h (1560 ml per day) Prosource TF 45 ml BID  Provides 1952 kcal, 108 gm protein, 1265 ml free water daily  150 ml free water every 4 hours Total free water: 2165 ml   NUTRITION DIAGNOSIS:   Severe Malnutrition related to chronic illness (COPD, DM, ETOH abuse) as evidenced by severe fat depletion, severe muscle depletion.  Ongoing  GOAL:   Patient will meet greater than or equal to 90% of their needs  Met with TF  MONITOR:   Diet advancement, TF tolerance  REASON FOR ASSESSMENT:   Consult, Ventilator Enteral/tube feeding initiation and management  ASSESSMENT:   Pt with PMH of DM, HTN, ETOH abuse, COPD, HLD, and CKD admitted for planned redo R carotid endarterectomy then after neuro change s/p stat exploration of R CEA and R ICA thrombectomy with no intervention.  Pt provides limited history. Per pt he drinks heavily but quit 3 months ago. He lost his appetite 2 months ago and reports losing a lot of weight. He is unable to specify amount. Pt states that he has started to use a walker and a cane at home to get around.  No nausea currently   8/3 admit 8/4 self-extubated 8/5 re-intubated 8/6 cortrak placed  8/7 extubated 8/8 pt with some nausea and TF rate decreased from 65 to 50   Medications reviewed and include: SSI, 18 units lantus daily, MVI with minerals  Labs reviewed: Na 146  Current TF: Vital AF 1.2 at 65 ml/h  Provides 1872 kcal, 117 gm protein  NUTRITION - FOCUSED PHYSICAL EXAM:    Most Recent Value  Orbital Region Moderate depletion  Upper Arm Region Severe depletion  Thoracic and Lumbar Region Severe depletion  Buccal Region Severe depletion  Temple Region Moderate depletion  Clavicle Bone Region Severe depletion  Clavicle and Acromion Bone Region Severe depletion  Scapular Bone Region  Severe depletion  Dorsal Hand Moderate depletion  Patellar Region Severe depletion  Anterior Thigh Region Severe depletion  Posterior Calf Region Severe depletion  Edema (RD Assessment) None  Hair Reviewed  Eyes Reviewed  Mouth Reviewed  [missing most teeth]  Skin Reviewed  Nails Reviewed       Diet Order:   Diet Order            Diet NPO time specified  Diet effective now                 EDUCATION NEEDS:   No education needs have been identified at this time  Skin:  Skin Assessment: Reviewed RN Assessment  Last BM:  8/9  Height:   Ht Readings from Last 1 Encounters:  12/19/2019 5' 6"  (1.676 m)    Weight:   Wt Readings from Last 1 Encounters:  12/10/19 62.9 kg    Ideal Body Weight:  64.5 kg  BMI:  Body mass index is 22.38 kg/m.  Estimated Nutritional Needs:   Kcal:  1867  Protein:  95-115 grams  Fluid:  > 1.8 L/day  Kelly Williams P., RD, LDN, CNSC See AMiON for contact information

## 2019-12-11 ENCOUNTER — Inpatient Hospital Stay (HOSPITAL_COMMUNITY): Payer: Medicare HMO

## 2019-12-11 DIAGNOSIS — J9601 Acute respiratory failure with hypoxia: Secondary | ICD-10-CM | POA: Diagnosis not present

## 2019-12-11 DIAGNOSIS — E43 Unspecified severe protein-calorie malnutrition: Secondary | ICD-10-CM

## 2019-12-11 DIAGNOSIS — G459 Transient cerebral ischemic attack, unspecified: Secondary | ICD-10-CM | POA: Diagnosis not present

## 2019-12-11 DIAGNOSIS — Z8673 Personal history of transient ischemic attack (TIA), and cerebral infarction without residual deficits: Secondary | ICD-10-CM | POA: Diagnosis not present

## 2019-12-11 LAB — BASIC METABOLIC PANEL WITH GFR
Anion gap: 11 (ref 5–15)
BUN: 40 mg/dL — ABNORMAL HIGH (ref 8–23)
CO2: 27 mmol/L (ref 22–32)
Calcium: 8.1 mg/dL — ABNORMAL LOW (ref 8.9–10.3)
Chloride: 109 mmol/L (ref 98–111)
Creatinine, Ser: 1.36 mg/dL — ABNORMAL HIGH (ref 0.61–1.24)
GFR calc Af Amer: 57 mL/min — ABNORMAL LOW
GFR calc non Af Amer: 49 mL/min — ABNORMAL LOW
Glucose, Bld: 158 mg/dL — ABNORMAL HIGH (ref 70–99)
Potassium: 3.8 mmol/L (ref 3.5–5.1)
Sodium: 147 mmol/L — ABNORMAL HIGH (ref 135–145)

## 2019-12-11 LAB — GLUCOSE, CAPILLARY
Glucose-Capillary: 259 mg/dL — ABNORMAL HIGH (ref 70–99)
Glucose-Capillary: 231 mg/dL — ABNORMAL HIGH (ref 70–99)
Glucose-Capillary: 205 mg/dL — ABNORMAL HIGH (ref 70–99)
Glucose-Capillary: 156 mg/dL — ABNORMAL HIGH (ref 70–99)
Glucose-Capillary: 147 mg/dL — ABNORMAL HIGH (ref 70–99)
Glucose-Capillary: 115 mg/dL — ABNORMAL HIGH (ref 70–99)

## 2019-12-11 LAB — CBC
HCT: 28 % — ABNORMAL LOW (ref 39.0–52.0)
Hemoglobin: 8.9 g/dL — ABNORMAL LOW (ref 13.0–17.0)
MCH: 30.5 pg (ref 26.0–34.0)
MCHC: 31.8 g/dL (ref 30.0–36.0)
MCV: 95.9 fL (ref 80.0–100.0)
Platelets: 315 10*3/uL (ref 150–400)
RBC: 2.92 MIL/uL — ABNORMAL LOW (ref 4.22–5.81)
RDW: 14 % (ref 11.5–15.5)
WBC: 12.5 10*3/uL — ABNORMAL HIGH (ref 4.0–10.5)
nRBC: 0 % (ref 0.0–0.2)

## 2019-12-11 LAB — BRAIN NATRIURETIC PEPTIDE: B Natriuretic Peptide: 1775.5 pg/mL — ABNORMAL HIGH (ref 0.0–100.0)

## 2019-12-11 MED ORDER — NALOXONE HCL 0.4 MG/ML IJ SOLN
INTRAMUSCULAR | Status: AC
  Filled 2019-12-11: qty 1

## 2019-12-11 MED ORDER — INSULIN GLARGINE 100 UNIT/ML ~~LOC~~ SOLN
28.0000 [IU] | Freq: Every day | SUBCUTANEOUS | Status: DC
  Administered 2019-12-12 – 2019-12-14 (×3): 28 [IU] via SUBCUTANEOUS
  Filled 2019-12-11 (×5): qty 0.28

## 2019-12-11 MED ORDER — INSULIN GLARGINE 100 UNIT/ML ~~LOC~~ SOLN: 23.0000 [IU] | Freq: Every day | SUBCUTANEOUS | Status: DC

## 2019-12-11 MED ORDER — FUROSEMIDE 10 MG/ML IJ SOLN
40.0000 mg | Freq: Once | INTRAMUSCULAR | Status: AC
  Administered 2019-12-11: 40 mg via INTRAVENOUS
  Filled 2019-12-11: qty 4

## 2019-12-11 MED ORDER — INSULIN GLARGINE 100 UNIT/ML ~~LOC~~ SOLN
10.0000 [IU] | Freq: Once | SUBCUTANEOUS | Status: AC
  Administered 2019-12-11: 10 [IU] via SUBCUTANEOUS
  Filled 2019-12-11: qty 0.1

## 2019-12-11 MED ORDER — SODIUM CHLORIDE 0.9 % IV SOLN: INTRAVENOUS | Status: DC | PRN

## 2019-12-11 NOTE — Progress Notes (Signed)
PROGRESS NOTE    Kelly Williams  LFY:[id number] DOB: 1941/03/21 DOA: 12/20/2019 PCP: Luetta Nutting, DO   Brief Narrative:  Kelly Williams is a 79 y/o M, admitted to [facility] for planned re-do of R CEA after CTA on prior admission revealed bilateral carotid stenosis R>L. He had a change in mental status and STAT duplex showed concern for ICA occlusion. He was taken back to the OR; however, ICA was found to be patent. He was subsequently sent for STAT CTA head which head which did not show any acute event. Returned to the ICU and PCCM was asked to assist with vent management.   He was subsequently transferred to medical floor.  Vascular surgery remains primary.  TRH was asked to take over as consultant starting 12/11/2019.  Significant Hospital Events   8/3: Admitted to [facility], CEA, PCCM Consult for vent management.  8/4: Self extubated.  8/5: Re-intubated O/N 8/7: Extubated   Assessment & Plan:   Active Problems:   Chronic kidney disease   Carotid artery stenosis   Endotracheal tube present   Encephalopathy acute   Acute hypoxemic respiratory failure (HCC)   Protein-calorie malnutrition, severe   Acute respiratory failure requiring BiPAP for airway protection: Patient was intubated for airway protection for procedure.  He was extubated on 8/7 and placed on BiPAP for an event of acute desaturation into the 70s.  Pulmonary embolism was a concern given elevated D-dimer but CTA was held due to low Wells score and worsening renal function.  Patient improved and was down to 3 L nasal cannula oxygen and was transferred to medical floor.  Currently he is on 5 L nasal cannula however he states that he is feeling better than yesterday.  On lung exam, he has rhonchi and crackles.  Chest x-ray worse but I think this is likely pulmonary edema.  Came in with elevated BNP.  Checking BNP again today.  We will give him a dose of Lasix IV 40 mg.   Aspiration pneumonia Bilateral pleural effusion Chest x-ray on 8/5  post intubation shows developing infiltrations or atelectasis in the right lung base and small right pleural effusion concerning for aspiration pneumonia.  Unasyn was started.  Patient is afebrile.  His leukocytosis also improving.  Chest x-ray on 8/7 shows worsening bilateral effusions.  Patient had one-time Lasix for diuresis.  -Continue Unasyn, duration of 7 days (6/7) however per vascular surgery note, the plan to continue his Unasyn for total 2 weeks until 12/20/2019.   AKI on CKD stage IIIb Hyperkalemia Back to baseline.  Hyperkalemia resolved.  Refeeding syndrome Given history of alcohol abuse and difficult swallowing food at home, monitor for refeeding syndrome.  Monitor mag and Phos and potassium daily.  Replete as needed -BMP, mag, Phos in a.m.  Atrial fibrillation Patient was found to be in A. fib on 8/7 which is a new diagnosis for him. His Chads score is 6.  Consider starting anticoagulation depends on patient's  status and goal of care.  Will defer to vascular surgery to provide guidance on when he can be cleared for full anticoagulation. -Heparin for DVT prophylaxis  Hypertension: Blood pressure fairly stable. -Continue home dose losartan   Hyperlipidemia CAD status post CABG -Continue Plavix, ASA, Lipitor   Diabetes type II Slightly elevated blood sugar.  On 18 units of Lantus.  Will increase to 28 units.  Continue SSI.   DVT prophylaxis: SCD's Start: 12/23/2019 1437   Code Status: Full Code  Family Communication: None present at  bedside.  Plan of care discussed with patient in length and he verbalized understanding and agreed with it.  Status is: Inpatient  Remains inpatient appropriate because:Inpatient level of care appropriate due to severity of illness   Dispo: The patient is from: Home              Anticipated d/c is to: CIR              Anticipated d/c date is: 2 days              Patient currently is not medically stable to  d/c.        Estimated body mass index is 22.2 kg/m as calculated from the following:   Height as of this encounter: 5' 6" (1.676 m).   Weight as of this encounter: 62.4 kg.      Nutritional status:  Nutrition Problem: Severe Malnutrition Etiology: chronic illness (COPD, DM, ETOH abuse)   Signs/Symptoms: severe fat depletion, severe muscle depletion   Interventions: Prostat, Tube feeding, MVI    Consultants:   Hospitalist  Procedures:  8/3: ETT 8/3: R CEA 8/3 Exploration of R CEA and R ICA thrombectomy 8/3: R art line   Antimicrobials:  Anti-infectives (From admission, onward)   Start     Dose/Rate Route Frequency Ordered Stop   12/06/19 0900  Ampicillin-Sulbactam (UNASYN) 3 g in sodium chloride 0.9 % 100 mL IVPB     Discontinue     3 g 200 mL/hr over 30 Minutes Intravenous Every 6 hours 12/06/19 0820     12/13/2019 1700  ceFAZolin (ANCEF) IVPB 2g/100 mL premix        2 g 200 mL/hr over 30 Minutes Intravenous Every 8 hours 12/23/2019 1436 12/05/19 0859   12/20/2019 0743  ceFAZolin (ANCEF) IVPB 2g/100 mL premix        2 g 200 mL/hr over 30 Minutes Intravenous 30 min pre-op 12/13/2019 0743 12/27/2019 0942         Subjective: Seen and examined.  Sitting in the bed.  Despite of requiring slightly higher amount of oxygen compared to yesterday, patient tells me that he is feeling better.  He is using hand gestures mostly as he is too tired to talk much but he is still talking.  He looks comfortable.  Objective: Vitals:   12/11/19 0314 12/11/19 0523 12/11/19 0530 12/11/19 0720  BP:   (!) 147/89 (!) 157/79  Pulse:   (!) 106 99  Resp:   (!) 26 17  Temp:   99.1 F (37.3 C) 99.4 F (37.4 C)  TempSrc:    Axillary  SpO2:  96% 93% 100%  Weight: 62.4 kg     Height:        Intake/Output Summary (Last 24 hours) at 12/11/2019 1106 Last data filed at 12/11/2019 0439 Gross per 24 hour  Intake 1335.08 ml  Output 1150 ml  Net 185.08 ml   Filed Weights   12/02/2019 0731  12/10/19 0400 12/11/19 0314  Weight: 63.5 kg 62.9 kg 62.4 kg    Examination:  General exam: Appears chronically sick Respiratory system: Coarse breath sounds with bilateral rhonchi and crackles. Respiratory effort normal. Cardiovascular system: S1 & S2 heard, RRR. No JVD, murmurs, rubs, gallops or clicks. No pedal edema. Gastrointestinal system: Abdomen is nondistended, soft and nontender. No organomegaly or masses felt. Normal bowel sounds heard. Central nervous system: Alert and oriented. No focal neurological deficits. Extremities: Symmetric 5 x 5 power. Skin: No rashes, lesions or ulcers  Data  Reviewed: I have personally reviewed following labs and imaging studies  CBC: Recent Labs  Lab 12/07/19 0625 12/07/19 2030 12/08/19 0604 12/08/19 0604 12/08/19 1657 12/09/19 0812 12/09/19 1025 12/10/19 0359 12/11/19 0141  WBC 13.6*  --  13.9*  --   --  15.8*  --  13.9* 12.5*  HGB 7.0*   < > 7.8*   < > 8.9* 9.5* 10.2* 8.7* 8.9*  HCT 21.4*   < > 23.7*   < > 27.1* 29.0* 30.0* 26.8* 28.0*  MCV 94.7  --  92.9  --   --  95.7  --  95.7 95.9  PLT 229  --  206  --   --  285  --  288 315   < > = values in this interval not displayed.   Basic Metabolic Panel: Recent Labs  Lab 12/06/19 0739 12/06/19 1646 12/07/19 0625 12/07/19 0625 12/07/19 2030 12/08/19 0604 12/09/19 2458 12/09/19 1025 12/10/19 0359 12/11/19 0141  NA   < >  --  144   < >  --  140 146* 147* 146* 147*  K   < >  --  3.1*   < >  --  3.7 5.2* 3.8 3.7 3.8  CL   < >  --  113*  --   --  115* 115*  --  112* 109  CO2   < >  --  19*  --   --  17* 21*  --  24 27  GLUCOSE   < >  --  232*  --   --  223* 193*  --  139* 158*  BUN   < >  --  39*  --   --  45* 56*  --  45* 40*  CREATININE   < >  --  1.60*  --   --  1.70* 1.84*  --  1.52* 1.36*  CALCIUM   < >  --  7.5*  --   --  6.8* 7.7*  --  7.5* 8.1*  MG   < > 1.8 2.0  --  2.2 2.0 2.4  --  2.0  --   PHOS  --  2.7 2.1*  --  4.5  --  4.1  --  3.5  --    < > = values in this  interval not displayed.   GFR: Estimated Creatinine Clearance: 38.9 mL/min (A) (by C-G formula based on SCr of 1.36 mg/dL (H)). Liver Function Tests: No results for input(s): AST, ALT, ALKPHOS, BILITOT, PROT, ALBUMIN in the last 168 hours. No results for input(s): LIPASE, AMYLASE in the last 168 hours. No results for input(s): AMMONIA in the last 168 hours. Coagulation Profile: No results for input(s): INR, PROTIME in the last 168 hours. Cardiac Enzymes: No results for input(s): CKTOTAL, CKMB, CKMBINDEX, TROPONINI in the last 168 hours. BNP (last 3 results) No results for input(s): PROBNP in the last 8760 hours. HbA1C: No results for input(s): HGBA1C in the last 72 hours. CBG: Recent Labs  Lab 12/10/19 1532 12/10/19 1953 12/10/19 2332 12/11/19 0406 12/11/19 0723  GLUCAP 161* 213* 186* 259* 231*   Lipid Profile: No results for input(s): CHOL, HDL, LDLCALC, TRIG, CHOLHDL, LDLDIRECT in the last 72 hours. Thyroid Function Tests: No results for input(s): TSH, T4TOTAL, FREET4, T3FREE, THYROIDAB in the last 72 hours. Anemia Panel: No results for input(s): VITAMINB12, FOLATE, FERRITIN, TIBC, IRON, RETICCTPCT in the last 72 hours. Sepsis Labs: Recent Labs  Lab 12/06/19 1150  LATICACIDVEN 2.7*  Recent Results (from the past 240 hour(s))  Surgical pcr screen     Status: None   Collection Time: 12/27/2019  8:37 AM   Specimen: Nasal Mucosa; Nasal Swab  Result Value Ref Range Status   MRSA, PCR NEGATIVE NEGATIVE Final   Staphylococcus aureus NEGATIVE NEGATIVE Final    Comment: (NOTE) The Xpert SA Assay (FDA approved for NASAL specimens in patients 79 years of age and older), is one component of a comprehensive surveillance program. It is not intended to diagnose infection nor to guide or monitor treatment. Performed at [facility], [place] [address] [place], [place] [address]   Culture, respiratory (non-expectorated)     Status: None   Collection Time: 12/06/19  9:18 AM    Specimen: Tracheal Aspirate; Respiratory  Result Value Ref Range Status   Specimen Description TRACHEAL ASPIRATE  Final   Special Requests Normal  Final   Gram Stain NO WBC SEEN NO ORGANISMS SEEN   Final   Culture   Final    RARE Normal respiratory flora-no Staph aureus or Pseudomonas seen Performed at [facility], [address], [place], [place] [address]    Report Status 12/08/2019 FINAL  Final      Radiology Studies: DG Chest Port 1 View  Result Date: 12/11/2019 CLINICAL DATA:  Acute respiratory distress. EXAM: PORTABLE CHEST 1 VIEW COMPARISON:  Chest x-ray 12/10/2019 FINDINGS: The feeding tube is stable. The cardiac silhouette, mediastinal and hilar contours are within normal limits and unchanged. Stable tortuosity and calcification of the thoracic aorta. Stable surgical changes from bypass surgery. Persistent perihilar interstitial and airspace process, likely perihilar pulmonary edema. Slight improved aeration since yesterday's film. Persistent small right effusion and bibasilar atelectasis. No pneumothorax. IMPRESSION: Persistent but slightly improved perihilar interstitial and airspace process. Electronically Signed   By: Marijo Sanes M.D.   On: 12/11/2019 07:04   DG CHEST PORT 1 VIEW  Result Date: 12/10/2019 CLINICAL DATA:  Pleural effusion. EXAM: PORTABLE CHEST 1 VIEW COMPARISON:  12/08/2019 FINDINGS: The feeding tube is stable. Stable mild cardiac enlargement. Fairly extensive perihilar vascular congestion and pulmonary edema. Persistent bilateral pleural effusions and bibasilar atelectasis. IMPRESSION: Persistent CHF with bilateral pleural effusions and bibasilar atelectasis. Electronically Signed   By: Marijo Sanes M.D.   On: 12/10/2019 10:07    Scheduled Meds: . aspirin  81 mg Per Tube Daily  . atorvastatin  80 mg Per Tube QHS  . bethanechol  10 mg Per Tube TID  . chlorhexidine gluconate (MEDLINE KIT)  15 mL Mouth Rinse BID  . Chlorhexidine Gluconate Cloth  6  each Topical Q0600  . clopidogrel  75 mg Per Tube Daily  . feeding supplement (PROSource TF)  45 mL Per Tube BID  . free water  200 mL Per Tube Q6H  . gabapentin  300 mg Per Tube TID  . insulin aspart  0-15 Units Subcutaneous Q4H  . insulin glargine  10 Units Subcutaneous Once  . [START ON 12/12/2019] insulin glargine  28 Units Subcutaneous Daily  . mouth rinse  15 mL Mouth Rinse q12n4p  . multivitamin with minerals  1 tablet Per Tube Daily  . pantoprazole sodium  40 mg Per Tube QHS   Continuous Infusions: . sodium chloride Stopped (12/08/19 2057)  . ampicillin-sulbactam (UNASYN) IV 3 g (12/11/19 8250)  . feeding supplement (JEVITY 1.2 CAL) 1,000 mL (12/10/19 1423)  . magnesium sulfate bolus IVPB       LOS: 7 days   Time spent: 10  minutes   Darliss Cheney, MD [facility]  12/11/2019, 11:06 AM   To contact the attending provider between 7A-7P or the covering provider during after hours 7P-7A, please log into the web site [email or web address].

## 2019-12-11 NOTE — Progress Notes (Addendum)
Physical Therapy Treatment Patient Details Name: Kelly Williams MRN: [id number] DOB: April 26, 1941 Today's Date: 12/11/2019    History of Present Illness Pt is a 79 y/o male with PMH of stroke, HTN, DM, periphreal neuropathy, carotid disease with L carotid endarterectomy, CABG x 5, R shoulder arthroplasthy, medication noncompliance and alcohol abuse. Daughter found patient after falling and intoxicated, L facial droop, slurring speech and aggression (reports hx of mulitple falls). MRI reveals acute ischemic infarcts of R frontal and parietal lobes. MRA of neck revealed focal stenosis in the right internal carotid artery approximately 3 cm distal to the bifurcation with an adjacent 11 mm aneurysmal dilatation. Pt underwent R carotid endarterectomy on 8/3. Pt with change in neuro status after procedure and was taken back to OR for exploration with no significant findings. Pt required intubation on 8/5 due to desaturation, extubated 12/08/19.      PT Comments    Pt more alert than earlier and with lots of assistance able to amb a few feet. Pt fatigues quickly. Pt's daughter is not going to be able to provide 24 hour care and pt will need extended time so will need SNF.    Follow Up Recommendations  SNF     Equipment Recommendations  None recommended by PT    Recommendations for Other Services       Precautions / Restrictions Precautions Precautions: Fall    Mobility  Bed Mobility            General bed mobility comments: Pt up in chair  Transfers Overall transfer level: Needs assistance Equipment used: Rolling walker (2 wheeled) Transfers: Sit to/from Stand Sit to Stand: Mod assist;+2 physical assistance         General transfer comment: Assist to bring hips up and for balance  Ambulation/Gait Ambulation/Gait assistance: +2 physical assistance;Mod assist Gait Distance (Feet): 3 Feet Assistive device: Rolling walker (2 wheeled) Gait Pattern/deviations: Step-to  pattern;Decreased step length - right;Decreased step length - left;Shuffle;Trunk flexed (knees sagging) Gait velocity: decr Gait velocity interpretation: <1.31 ft/sec, indicative of household ambulator General Gait Details: Assist for balance and support. Pt with knees sagging but no complete buckling   Stairs             Wheelchair Mobility    Modified Rankin (Stroke Patients Only) Modified Rankin (Stroke Patients Only) Pre-Morbid Rankin Score: Moderate disability Modified Rankin: Severe disability     Balance Overall balance assessment: Needs assistance Sitting-balance support: Feet supported;Bilateral upper extremity supported Sitting balance-Leahy Scale: Poor Sitting balance - Comments: UE support and min assist for static sittinig  Postural control: Right lateral lean Standing balance support: Bilateral upper extremity supported Standing balance-Leahy Scale: Poor Standing balance comment: Mod assist and walker for static standing                            Cognition Arousal/Alertness: Awake/alert Behavior During Therapy: Flat affect Overall Cognitive Status: Impaired/Different from baseline Area of Impairment: Problem solving                   Current Attention Level: Sustained   Following Commands: Follows one step commands with increased time     Problem Solving: Slow processing;Decreased initiation;Requires verbal cues;Requires tactile cues General Comments: Pt more alert than earlier       Exercises      General Comments General comments (skin integrity, edema, etc.): VSS on O2      Pertinent Vitals/Pain Pain Assessment: No/denies  pain    Home Living                      Prior Function            PT Goals (current goals can now be found in the care plan section) Acute Rehab PT Goals Patient Stated Goal: Pt unable to state Progress towards PT goals: Progressing toward goals    Frequency    Min  2X/week      PT Plan Discharge plan needs to be updated    Co-evaluation              AM-PAC PT "6 Clicks" Mobility   Outcome Measure  Help needed turning from your back to your side while in a flat bed without using bedrails?: A Lot Help needed moving from lying on your back to sitting on the side of a flat bed without using bedrails?: Total Help needed moving to and from a bed to a chair (including a wheelchair)?: Total Help needed standing up from a chair using your arms (e.g., wheelchair or bedside chair)?: A Lot Help needed to walk in hospital room?: Total Help needed climbing 3-5 steps with a railing? : Total 6 Click Score: 8    End of Session Equipment Utilized During Treatment: Gait belt Activity Tolerance: Patient limited by fatigue Patient left: in chair;with call bell/phone within reach;with chair alarm set Nurse Communication: Mobility status;Need for lift equipment PT Visit Diagnosis: Other abnormalities of gait and mobility (R26.89);Other symptoms and signs involving the nervous system (R29.898)     Time: 7124-5809 PT Time Calculation (min) (ACUTE ONLY): 16 min  Charges:  $Therapeutic Activity: 8-22 mins                     [place] Pager [phone] Office [place] 12/11/2019, 2:23 PM

## 2019-12-11 NOTE — Progress Notes (Signed)
Called by pt's primary RN to assess for increased SOB. Pt with coarse crackles/scattered exp wheeze noted. PRN neb tx given, and pt nasotracheally suctioned returning copious amount of thick/tanish-pink secretions. O2 increased to 6L [place] for comfort. RT will continue to monitor.

## 2019-12-11 NOTE — Progress Notes (Signed)
Pt pulled his Cortex tube out. Dr. Trula Slade, on-call provider notified. Order received for IV 0.9% NSS 50 ml/hr, continue to NPO and wait for Cortex tube replacement at am.   Pt's confused, soft mitten has been used, non verbal, gurgling in his throat. He's unable to cough and clear his throat, oral suction performed.  Left facial drooping and left side weakness as previous assessment. He's hemodynamically stable.  On HFNCL 5 LPM,  SPO2 100%,  RR 20, HR 90s- 105, NSR/ST on monitor.  Febrile, temp 100.1 -97.9 F orally.   Continue to monitor.  Kennyth Lose, RN

## 2019-12-11 NOTE — Significant Event (Addendum)
Rapid Response Event Note   Reason for Call : Acute Desaturation requiring NRB mask at 15L   Initial Focused Assessment:  I was notified by Kelly Williams RT regarding acute desaturation to the 80% and pt now requiring NRB at 15L. Upon arrival, Kelly Williams was lethargic, tachypneic with some retractions and abdominal accessory muscle use. His sats were 83-86% on 15L NRB, RR 27. BBS coarse RH and more diminished on the right. Skin is pink, warm and dry with cap refil at 3 secs. Originally he was only arousable to noxious stimuli and localize, but while at bedside he was able to wave and nod his head. Morphine was recently given, however since he is arousable and tachypneic we did not chose to try Narcan. Kelly Williams has had a problem mobilizing secretions and he has a decent cough however he is unable to clear them effectively. Multiple deep oral suction attempts were performed with a lot of blood tinged secretions.  After pulmonary toileting and suctioning, his sats came up to 96% on NRB.    Interventions:  -Stat PCXR  Plan of Care:  -Keep HOB greater than 30 degrees -aggressive pulmonary toileting (TCDB, IS, flutter valve if appropriate) -Notify primary service of events and further orders.    MD Notified: Per Primary RN Call Time: 0600 Arrival Time: 0605 End Time: 5573  Madelynn Done, RN

## 2019-12-11 NOTE — Progress Notes (Signed)
  Speech Language Pathology Treatment: Dysphagia  Patient Details Name: Kelly Williams MRN: [id number] DOB: 18-Jan-1941 Today's Date: 12/11/2019 Time: 3559-7416 SLP Time Calculation (min) (ACUTE ONLY): 13 min  Assessment / Plan / Recommendation Clinical Impression  Pt was seen for dysphagia treatment. He was alert and cooperative throughout the session. Pt had rapid response this morning due to desaturation and increased SOB and required non-rebreather mask. Pt was subsequently retuned to nasal canula by RN. He presented with a mildly wet vocal quality at baseline which was improved with prompted coughing. Pt exhibited a wet vocal quality and coughing following ice chips, thin liquids via small cup sip, and nectar thick liquids via tsp, suggesting aspiration. A pharyngeal delay is suspected considering the passage of 10-12 seconds prior to any hyolaryngeal movement. Pt's swallow subjectively appeared weak during palpation, but this cannot be conclusively determined at bedside. Multiple swallows were noted with all trials and pharyngeal residue is therefore suspected. It is recommended that the pt's NPO status be maintained with limited (3-4) and occasional ice chips following thorough oral care. SLP will continue to follow pt to assess readiness for participation in a modified barium swallow study.    HPI HPI: Pt is a 79 year old male who suffered a R CVA on 7/26. He was readmitted for a planned redo of a R CEA on 8/3. Prior to the surgery, there was concern for a new stroke, but MRI showed prior infarct unchanged, and so R CEA was completed on 8/3. He was intubated 8/3 to 8/4 (self extubated). Was evaluated by SLP on 8/4 and found to have multiple swallows and poor secretion management. Pt was reintubated on 8/5 until 8/7. PMH: CVA, HTN, HLD, DM, CAD, COPD, CKD, arthritis, alcohol abuse. SLE 7/27 recommend OP SLP f/u for speech and memory.       SLP Plan  Continue with current plan of care        Recommendations  Diet recommendations: NPO Medication Administration: Via alternative means                Oral Care Recommendations: Oral care QID Follow up Recommendations: Inpatient Rehab SLP Visit Diagnosis: Dysphagia, oropharyngeal phase (R13.12) Plan: Continue with current plan of care       Dysen Edmondson I. Hardin Negus, [place], [place] Office number [phone] Pager [place] 12/11/2019, 10:07 AM

## 2019-12-11 NOTE — Progress Notes (Addendum)
Mobility Specialist - Progress Note  Pt required total assist from RN, NT, and myself w/ use of stedy to transfer from chair to bed.   Pricilla Handler Mobility Specialist Mobility Specialist Phone: [phone]

## 2019-12-11 NOTE — Progress Notes (Addendum)
Patient O2 sats dropped to the 80s, went in to check on him. States he is having a harder time breathing. I increased his o2 from 3L to 5L, his sats went up to 93%. Called RT to have a look at him. They suctioned him and [place] on 6L.

## 2019-12-11 NOTE — Progress Notes (Signed)
Physical Therapy Treatment Patient Details Name: Kelly Williams MRN: [id number] DOB: 1941/02/08 Today's Date: 12/11/2019    History of Present Illness Pt is a 79 y/o male with PMH of stroke, HTN, DM, periphreal neuropathy, carotid disease with L carotid endarterectomy, CABG x 5, R shoulder arthroplasthy, medication noncompliance and alcohol abuse. Daughter found patient after falling and intoxicated, L facial droop, slurring speech and aggression (reports hx of mulitple falls). MRI reveals acute ischemic infarcts of R frontal and parietal lobes. MRA of neck revealed focal stenosis in the right internal carotid artery approximately 3 cm distal to the bifurcation with an adjacent 11 mm aneurysmal dilatation. Pt underwent R carotid endarterectomy on 8/3. Pt with change in neuro status after procedure and was taken back to OR for exploration with no significant findings. Pt required intubation on 8/5 due to desaturation, extubated 12/08/19.      PT Comments    Pt remains very weak and with decr mobility. Was able to get OOB to chair with use of Stedy. Continue to recommend CIR for further therapy.    Follow Up Recommendations  CIR     Equipment Recommendations  None recommended by PT    Recommendations for Other Services       Precautions / Restrictions Precautions Precautions: Fall    Mobility  Bed Mobility Overal bed mobility: Needs Assistance Bed Mobility: Supine to Sit     Supine to sit: Max assist     General bed mobility comments: Assist to bring legs off of bed, elevate trunk into sitting and bring hips to EOB  Transfers Overall transfer level: Needs assistance Equipment used: Ambulation equipment used Transfers: Sit to/from Stand Sit to Stand: Mod assist         General transfer comment: Assist to bring hips up, trunk control and for balance. Used Stedy for bed to recliner.   Ambulation/Gait                 Stairs             Wheelchair Mobility     Modified Rankin (Stroke Patients Only)       Balance Overall balance assessment: Needs assistance Sitting-balance support: Feet supported;Bilateral upper extremity supported Sitting balance-Leahy Scale: Poor Sitting balance - Comments: UE support and min assist for static sittinig  Postural control: Right lateral lean Standing balance support: Bilateral upper extremity supported Standing balance-Leahy Scale: Poor Standing balance comment: Stood x 2 with Stedy for 10-15 seconds with mod assist to maintain. Verbal cues and manual facilitation to stand more erect.                             Cognition Arousal/Alertness: Lethargic Behavior During Therapy: Flat affect Overall Cognitive Status: Difficult to assess Area of Impairment: Problem solving                   Current Attention Level: Sustained   Following Commands: Follows one step commands with increased time     Problem Solving: Slow processing;Decreased initiation;Requires verbal cues;Requires tactile cues General Comments: Pt required frequent stimulation to stay awake and participate      Exercises      General Comments General comments (skin integrity, edema, etc.): VSS      Pertinent Vitals/Pain Pain Assessment: No/denies pain Faces Pain Scale: No hurt    Home Living  Prior Function            PT Goals (current goals can now be found in the care plan section) Acute Rehab PT Goals Patient Stated Goal: Pt unable to state Progress towards PT goals: Progressing toward goals    Frequency    Min 3X/week      PT Plan Current plan remains appropriate    Co-evaluation              AM-PAC PT "6 Clicks" Mobility   Outcome Measure  Help needed turning from your back to your side while in a flat bed without using bedrails?: A Lot Help needed moving from lying on your back to sitting on the side of a flat bed without using bedrails?: Total Help  needed moving to and from a bed to a chair (including a wheelchair)?: Total Help needed standing up from a chair using your arms (e.g., wheelchair or bedside chair)?: A Lot Help needed to walk in hospital room?: Total Help needed climbing 3-5 steps with a railing? : Total 6 Click Score: 8    End of Session Equipment Utilized During Treatment: Gait belt Activity Tolerance: Patient limited by lethargy;Patient limited by fatigue Patient left: with call bell/phone within reach;in chair;with chair alarm set Nurse Communication: Mobility status;Need for lift equipment PT Visit Diagnosis: Other abnormalities of gait and mobility (R26.89);Other symptoms and signs involving the nervous system (Q59.563)     Time: 8756-4332 PT Time Calculation (min) (ACUTE ONLY): 30 min  Charges:  $Therapeutic Activity: 23-37 mins                     [place] Pager [phone] Office [place] 12/11/2019, 12:43 PM

## 2019-12-11 NOTE — Progress Notes (Signed)
STROKE TEAM PROGRESS NOTE   INTERVAL HISTORY  No family is at the bedside. Pt sitting in chair, awake alert, still mildly lethargic.  Speech improving.  Neurologically stable.  Still has difficulty handling oral secretion with some gurgly sound.  On Unasyn.  Vitals:   12/11/19 1600 12/11/19 1611 12/11/19 1700 12/11/19 2000  BP: (!) 169/81  (!) 141/66 (!) 148/74  Pulse: (!) 104   (!) 101  Resp: 20   (!) 25  Temp: 98.9 F (37.2 C) 98.9 F (37.2 C)  100.1 F (37.8 C)  TempSrc: Oral   Oral  SpO2: 98%  100% 99%  Weight:      Height:       CBC:  Recent Labs  Lab 12/10/19 0359 12/11/19 0141  WBC 13.9* 12.5*  HGB 8.7* 8.9*  HCT 26.8* 28.0*  MCV 95.7 95.9  PLT 288 242   Basic Metabolic Panel:  Recent Labs  Lab 12/09/19 0812 12/09/19 1025 12/10/19 0359 12/11/19 0141  NA 146*   < > 146* 147*  K 5.2*   < > 3.7 3.8  CL 115*   < > 112* 109  CO2 21*   < > 24 27  GLUCOSE 193*   < > 139* 158*  BUN 56*   < > 45* 40*  CREATININE 1.84*   < > 1.52* 1.36*  CALCIUM 7.7*   < > 7.5* 8.1*  MG 2.4  --  2.0  --   PHOS 4.1  --  3.5  --    < > = values in this interval not displayed.   Last lipids Lab Results  Component Value Date   CHOL 131 11/27/2019   HDL 40 (L) 11/27/2019   LDLCALC 76 11/27/2019   TRIG 61 12/05/2019   CHOLHDL 3.3 11/27/2019   Lab Results  Component Value Date   HGBA1C 6.3 (H) 11/27/2019    IMAGING past 24 hours DG Chest Port 1 View  Result Date: 12/11/2019 CLINICAL DATA:  Acute respiratory distress. EXAM: PORTABLE CHEST 1 VIEW COMPARISON:  Chest x-ray 12/10/2019 FINDINGS: The feeding tube is stable. The cardiac silhouette, mediastinal and hilar contours are within normal limits and unchanged. Stable tortuosity and calcification of the thoracic aorta. Stable surgical changes from bypass surgery. Persistent perihilar interstitial and airspace process, likely perihilar pulmonary edema. Slight improved aeration since yesterday's film. Persistent small right  effusion and bibasilar atelectasis. No pneumothorax. IMPRESSION: Persistent but slightly improved perihilar interstitial and airspace process. Electronically Signed   By: Marijo Sanes M.D.   On: 12/11/2019 07:04    PHYSICAL EXAM  Temp:  [98.5 F (36.9 C)-100.1 F (37.8 C)] 100.1 F (37.8 C) (08/10 2000) Pulse Rate:  [99-111] 101 (08/10 2000) Resp:  [17-26] 25 (08/10 2000) BP: (131-169)/(66-89) 148/74 (08/10 2000) SpO2:  [80 %-100 %] 99 % (08/10 2000) FiO2 (%):  [100 %] 100 % (08/10 0733) Weight:  [62.4 kg] 62.4 kg (08/10 0314)  General - Well nourished, well developed, mildly lethargic  Ophthalmologic - fundi not visualized due to noncooperation.  Cardiovascular - Regular rhythm and rate, not in afib.  Neuro - Patient mildly lethargic but awake alert and follows all commands well, orientated x3. Mild to moderate dysarthria. Extraocular movements are full range without nystagmus, visual field full. PERRL. Mild left facial droop. Tongue midline. Right UE 5-/5 and LUE 4-/5 proximal and distal with pronator drift. BLE 3/5 proximal and 4+/5 distal DF/PF.  Sensation symmetrical. FTN intact but slow on the left. Plantars are downgoing.  Gait not tested.  ASSESSMENT/PLAN Mr. BRETON BERNS is a 79 y.o. male with history of diabetes, hypertension, hyperlipidemia, who suffered a mild stroke likely due to ICA stenosis last month who underwent redo carotid endarterectomy 8/3.  Following the endarterectomy, he was initially recovering well in postop, but then became unresponsive on the floor. Carotid doppler demonstrated R ICA reocclusion, however found to be open in the OR. CTA did not show LVO, but some R ICA thrombus.   Right brain TIA s/p R CEA  S/p right CEA redo 12/03/2019  Postop acute unresponsiveness  Carotid Doppler  R ICA c/w total occlusion   OR -> carotid artery exploration - patent carotid  CTA head & neck no acute abnormality. S/p repeat R CEA w/ decreased irregularity of  cervical ICA. Persisted narrowing distal ICA w/ increased filling defect, ? Clot. L ICA 60-70% stenosis.  MRI x 2 No new infarct  EEG no seizure  aspirin 325 mg daily and clopidogrel 75 mg daily prior to admission, now on aspirin 81 mg daily and clopidogrel 75 mg daily.   Therapy recommendations - CIR   Disposition pending  Respiratory failure Aspiration pneumonia  Patient remained intubated after procedure -> Extubated 8/7  Difficulty handling secretions, improving  CXR Persistent CHF with bilateral pleural effusions and bibasilar atelectasis..  Continued on unasyn   Leukocytosis -13.8 ->13.6->13.9->15.8->13.9-> 12.5  History of stroke  11/2019 admitted for right MCA small infarcts.  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  EF 50 to 55%.  LDL 76, A1c 6.3.  Put on DAPT and Lipitor 80. VVS on board recommend right CEA redo.  Carotid disease, bilateral   Hx R CEA 10 years ago and L CEA 7 years ago  S/p right CEA re-do 8/3  Left ICA 60 to 70% stenosis on CTA  VVS following left ICA stenosis as outpatient  Atrial Fibrillation, new onset this hospitalization  CHA2DS2-VASc Score = 7  Currently NSR  Concerning for transient A. fib associated with surgery  Continue DAPT for now  Consider 30-day cardiac event monitoring as outpatient   Hypotension History of hypertension  BP on lower side likely due to propofol  Off BP meds  BP stable now at goal . Long-term BP goal 130-150  Hyperlipidemia  Home meds:  lipitor 80, resumed in hospital  LDL 76, goal < 70  Continue statin at discharge  Diabetes type II Controlled  HgbA1c 6.3, goal < 7.0  SSI  CBG monitoring  PCP follow-up  AKI on CKD IIIa Hypernatremia  Cre - 1.60->1.70->1.84->1.52-> 1.36  Na 144->140->146->146-> 147  Put on free water 150 Q4 -> 200 Q6  BMP monitoring  Dysphagia   Did not pass swallow  NPO  On cortrak  On TF @ 62  On FW 200  Q6  Speech on board  Other Stroke Risk Factors  Advanced age  Former Cigarette smoker, quit 17 yrs ago  Chronic ETOH abuse,   Coronary artery disease w/ hx CABG x 5   Chronic systolic cardiomyopathy  Other Active Problems  Hx COPD  Peripheral neuropathy  GERD  Anemia - Hgb - 7.0->9.7->7.8->8.9->9.5->8.7-> 8.9  Hyperkalemia - 5.2->3.7-> 3.8   Hospital day # 7  Neurology will sign off. Please call with questions. Pt will follow up with stroke clinic NP at [facility] in about 4 weeks. Thanks for the consult.   Rosalin Hawking, MD PhD Stroke Neurology 12/11/2019 9:10 PM   To contact Stroke Continuity provider,  please refer to [email or web address] After hours, contact General Neurology

## 2019-12-11 NOTE — Progress Notes (Signed)
Patient o2 sats dropped down as low as 78. RT and RR  Called. Was on [place] at 6L switched to non rebreather on 15L currently at 100%.

## 2019-12-11 NOTE — Progress Notes (Signed)
Inpatient Rehabilitation Admissions Coordinator  I met with patient at bedside for rehab assessment and contacted his daughter, [name], by phone . We discussed goals and expectations of a CIR admit. She works and is unable to provide the projected supervision after a CIR admit. She wishes to pursue SNF rehab . I have alerted acute team and TOC. We will sign off at this time.  Danne Baxter, RN, MSN Rehab Admissions Coordinator [phone] 12/11/2019 1:05 PM

## 2019-12-11 NOTE — Progress Notes (Addendum)
[facility]   Subjective  - Not responding verbally, but nods head and gives thumbs up.   Objective (!) 147/89 (!) 106 99.1 F (37.3 C) (!) 26 93%  Intake/Output Summary (Last 24 hours) at 12/11/2019 0657 Last data filed at 12/11/2019 0439 Gross per 24 hour  Intake 1685.08 ml  Output 1150 ml  Net 535.08 ml    Heart tachy 106 bpm NS, O2 SAT 100% Moving all 4ext, no facial droop Lungs O2 increased to 6L Right neck incision healing well without hematoma  Assessment/Planning: POD # 7 Redo right carotid endarterectomy   CXR Persistent CHF with bilateral pleural effusions and bibasilar atelectasis..  Continued on unasyn   11/2019 admitted for right MCA small infarcts.  Did not pass swallow  NPO NG tube in place He was intubated 8/3 to 8/4 (self extubated). Was evaluated by SLP on 8/4 and found to have multiple swallows and poor secretion management. Pt was reintubated on 8/5 until 8/7.  HGB improving and stable at 8.9   Pending CIR  Kelly Williams 12/11/2019 6:57 AM --  Apparently increased O2 requirement overnight now improved He is able to talk but avoids it due to tubes in nose UE LE motor 5/5 Very deconditioned hopefully to rehab soon Continue tube feeds hopefully swallow will improve over next few weeks Continue Unasyn for full course for pneumonia will need to check start and end dates  Ruta Hinds, MD [facility] [place]: [phone]   Addendum stop date for unasyn at 2 weeks would be 8/19  Laboratory Lab Results: Recent Labs    12/10/19 0359 12/11/19 0141  WBC 13.9* 12.5*  HGB 8.7* 8.9*  HCT 26.8* 28.0*  PLT 288 315   BMET Recent Labs    12/10/19 0359 12/11/19 0141  NA 146* 147*  K 3.7 3.8  CL 112* 109  CO2 24 27  GLUCOSE 139* 158*  BUN 45* 40*  CREATININE 1.52* 1.36*  CALCIUM 7.5* 8.1*    COAG Lab Results  Component Value Date   INR 1.0 12/07/2019   INR  1.0 11/26/2019   No results found for: PTT

## 2019-12-12 ENCOUNTER — Inpatient Hospital Stay (HOSPITAL_COMMUNITY): Payer: Medicare HMO

## 2019-12-12 DIAGNOSIS — G9340 Encephalopathy, unspecified: Secondary | ICD-10-CM | POA: Diagnosis not present

## 2019-12-12 DIAGNOSIS — J9601 Acute respiratory failure with hypoxia: Secondary | ICD-10-CM | POA: Diagnosis not present

## 2019-12-12 DIAGNOSIS — Z9911 Dependence on respirator [ventilator] status: Secondary | ICD-10-CM

## 2019-12-12 LAB — POCT I-STAT 7, (LYTES, BLD GAS, ICA,H+H)
Acid-Base Excess: 7 mmol/L — ABNORMAL HIGH (ref 0.0–2.0)
Bicarbonate: 32 mmol/L — ABNORMAL HIGH (ref 20.0–28.0)
Calcium, Ion: 1.12 mmol/L — ABNORMAL LOW (ref 1.15–1.40)
HCT: 25 % — ABNORMAL LOW (ref 39.0–52.0)
Hemoglobin: 8.5 g/dL — ABNORMAL LOW (ref 13.0–17.0)
O2 Saturation: 100 %
Patient temperature: 98.6
Potassium: 3.3 mmol/L — ABNORMAL LOW (ref 3.5–5.1)
Sodium: 147 mmol/L — ABNORMAL HIGH (ref 135–145)
TCO2: 33 mmol/L — ABNORMAL HIGH (ref 22–32)
pCO2 arterial: 48.3 mmHg — ABNORMAL HIGH (ref 32.0–48.0)
pH, Arterial: 7.43 (ref 7.350–7.450)
pO2, Arterial: 321 mmHg — ABNORMAL HIGH (ref 83.0–108.0)

## 2019-12-12 LAB — GLUCOSE, CAPILLARY
Glucose-Capillary: 121 mg/dL — ABNORMAL HIGH (ref 70–99)
Glucose-Capillary: 127 mg/dL — ABNORMAL HIGH (ref 70–99)
Glucose-Capillary: 128 mg/dL — ABNORMAL HIGH (ref 70–99)
Glucose-Capillary: 210 mg/dL — ABNORMAL HIGH (ref 70–99)
Glucose-Capillary: 263 mg/dL — ABNORMAL HIGH (ref 70–99)
Glucose-Capillary: 310 mg/dL — ABNORMAL HIGH (ref 70–99)

## 2019-12-12 LAB — CBC
HCT: 30.3 % — ABNORMAL LOW (ref 39.0–52.0)
Hemoglobin: 9.4 g/dL — ABNORMAL LOW (ref 13.0–17.0)
MCH: 30.3 pg (ref 26.0–34.0)
MCHC: 31 g/dL (ref 30.0–36.0)
MCV: 97.7 fL (ref 80.0–100.0)
Platelets: 353 10*3/uL (ref 150–400)
RBC: 3.1 MIL/uL — ABNORMAL LOW (ref 4.22–5.81)
RDW: 14.1 % (ref 11.5–15.5)
WBC: 19.6 10*3/uL — ABNORMAL HIGH (ref 4.0–10.5)
nRBC: 0 % (ref 0.0–0.2)

## 2019-12-12 LAB — BLOOD GAS, ARTERIAL
Acid-Base Excess: 3.2 mmol/L — ABNORMAL HIGH (ref 0.0–2.0)
Bicarbonate: 28.9 mmol/L — ABNORMAL HIGH (ref 20.0–28.0)
Drawn by: 330991
FIO2: 100
O2 Saturation: 92.2 %
Patient temperature: 36.6
pCO2 arterial: 58 mmHg — ABNORMAL HIGH (ref 32.0–48.0)
pH, Arterial: 7.316 — ABNORMAL LOW (ref 7.350–7.450)
pO2, Arterial: 66.4 mmHg — ABNORMAL LOW (ref 83.0–108.0)

## 2019-12-12 LAB — BASIC METABOLIC PANEL
Anion gap: 11 (ref 5–15)
BUN: 39 mg/dL — ABNORMAL HIGH (ref 8–23)
CO2: 28 mmol/L (ref 22–32)
Calcium: 8.1 mg/dL — ABNORMAL LOW (ref 8.9–10.3)
Chloride: 105 mmol/L (ref 98–111)
Creatinine, Ser: 1.35 mg/dL — ABNORMAL HIGH (ref 0.61–1.24)
GFR calc Af Amer: 57 mL/min — ABNORMAL LOW (ref >60)
GFR calc non Af Amer: 50 mL/min — ABNORMAL LOW (ref >60)
Glucose, Bld: 122 mg/dL — ABNORMAL HIGH (ref 70–99)
Potassium: 3.5 mmol/L (ref 3.5–5.1)
Sodium: 144 mmol/L (ref 135–145)

## 2019-12-12 LAB — PROCALCITONIN: Procalcitonin: <0.1 ng/mL

## 2019-12-12 LAB — PHOSPHORUS: Phosphorus: 3.5 mg/dL (ref 2.5–4.6)

## 2019-12-12 LAB — MAGNESIUM: Magnesium: 2.4 mg/dL (ref 1.7–2.4)

## 2019-12-12 LAB — LACTIC ACID, PLASMA: Lactic Acid, Venous: 1.1 mmol/L (ref 0.5–1.9)

## 2019-12-12 MED ORDER — MIDAZOLAM HCL 2 MG/2ML IJ SOLN
2.0000 mg | Freq: Once | INTRAMUSCULAR | Status: AC
  Administered 2019-12-12: 2 mg via INTRAVENOUS

## 2019-12-12 MED ORDER — HYDRALAZINE HCL 20 MG/ML IJ SOLN
10.0000 mg | INTRAMUSCULAR | Status: DC | PRN
  Administered 2019-12-21: 20 mg via INTRAVENOUS
  Filled 2019-12-12: qty 1

## 2019-12-12 MED ORDER — MIDAZOLAM HCL 2 MG/2ML IJ SOLN
1.0000 mg | INTRAMUSCULAR | Status: AC | PRN
  Administered 2019-12-15 – 2019-12-17 (×3): 1 mg via INTRAVENOUS
  Filled 2019-12-12 (×4): qty 2

## 2019-12-12 MED ORDER — FENTANYL CITRATE (PF) 100 MCG/2ML IJ SOLN: 100.0000 ug | Freq: Once | INTRAMUSCULAR | Status: DC

## 2019-12-12 MED ORDER — CHLORHEXIDINE GLUCONATE 0.12% ORAL RINSE (MEDLINE KIT)
15.0000 mL | Freq: Two times a day (BID) | OROMUCOSAL | Status: DC
  Administered 2019-12-12 – 2020-01-04 (×46): 15 mL via OROMUCOSAL

## 2019-12-12 MED ORDER — SODIUM CHLORIDE 0.9 % IV SOLN: INTRAVENOUS | Status: DC | PRN

## 2019-12-12 MED ORDER — POTASSIUM CHLORIDE 20 MEQ PO PACK
40.0000 meq | PACK | Freq: Once | ORAL | Status: AC
  Administered 2019-12-12: 40 meq
  Filled 2019-12-12: qty 2

## 2019-12-12 MED ORDER — MIDAZOLAM HCL 2 MG/2ML IJ SOLN
INTRAMUSCULAR | Status: AC
  Filled 2019-12-12: qty 2

## 2019-12-12 MED ORDER — MIDAZOLAM HCL 2 MG/2ML IJ SOLN
1.0000 mg | INTRAMUSCULAR | Status: DC | PRN
  Administered 2019-12-12 – 2019-12-14 (×2): 1 mg via INTRAVENOUS
  Filled 2019-12-12: qty 2

## 2019-12-12 MED ORDER — ROCURONIUM BROMIDE 10 MG/ML (PF) SYRINGE
PREFILLED_SYRINGE | INTRAVENOUS | Status: AC
  Filled 2019-12-12: qty 10

## 2019-12-12 MED ORDER — IPRATROPIUM-ALBUTEROL 0.5-2.5 (3) MG/3ML IN SOLN
3.0000 mL | RESPIRATORY_TRACT | Status: DC
  Administered 2019-12-12 – 2019-12-16 (×25): 3 mL via RESPIRATORY_TRACT
  Filled 2019-12-12 (×25): qty 3

## 2019-12-12 MED ORDER — ETOMIDATE 2 MG/ML IV SOLN
INTRAVENOUS | Status: AC
  Filled 2019-12-12: qty 10

## 2019-12-12 MED ORDER — ORAL CARE MOUTH RINSE
15.0000 mL | OROMUCOSAL | Status: DC
  Administered 2019-12-12 – 2020-01-04 (×226): 15 mL via OROMUCOSAL

## 2019-12-12 MED ORDER — ROCURONIUM BROMIDE 50 MG/5ML IV SOLN
1.0000 mg/kg | Freq: Once | INTRAVENOUS | Status: AC
  Administered 2019-12-12: 62.4 mg via INTRAVENOUS
  Filled 2019-12-12: qty 6.24

## 2019-12-12 NOTE — Progress Notes (Signed)
SLP Cancellation Note  Patient Details Name: Kelly Williams MRN: [id number] DOB: May 10, 1940   Cancelled treatment:   Pt has been intubated.  Will follow along.  Kelly Williams, [place] Office number [phone] Pager [phone]         Kelly Williams 12/12/2019, 8:21 AM

## 2019-12-12 NOTE — Progress Notes (Signed)
NAME:  Kelly Williams, MRN:  [id number], DOB:  August 28, 1940, LOS: 71 ADMISSION DATE:  12/12/2019, CONSULTATION DATE:  12/17/2019 REFERRING MD:  Dr. Oneida Alar, CHIEF COMPLAINT:  Vent Management   Brief History   Mr. Sargent is a 79 y/o M, admitted to [facility] for planned re-do of R CEA after CTA on prior admission revealed bilateral carotid stenosis R>L. He had a change in mental status and STAT duplex showed concern for ICA occlusion. He was taken back to the OR; however, ICA was found to be patent. He was subsequently sent for STAT CTA head which head which did not show any acute event. Returned to the ICU and PCCM was asked to assist with vent management.   Patient extubated 8/7. On 8/11 PCCM consulted as patient was noted to be obtunded. Transferred to ICU and Intubated.   Past Medical History  Hypertension Hyperlipidemia Diabetes COPD CKD Chronic alcohol abuse CAD status post CABG in 2020  [place] Hospital Events   8/3: Admitted to [facility], CEA, PCCM Consult for vent management.  8/4: Self extubated.  8/5: Re-intubated O/N 8/7: Extubated  Consults:  PCCM Neurology  Procedures:  8/3: ETT 8/3: R CEA 8/3 Exploration of R CEA and R ICA thrombectomy 8/3: R art line   Significant Diagnostic Tests:  Carotid duplex 8/3 > right ICA occlusion. 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 Persistent narrowing and irregularity of the distal ICA with increased eccentric intraluminal filling defect, which may reflect unstable, migrated clot. 60-70% stenosis of the proximal left ICA. No proximal intracranial vessel occlusion. Stable findings of multifocal intracranial atherosclerosis.  EEG 8/3 >Severe diffuse encephalopathy, nonspecific etiology but likely related to sedation. NO seizures or epileptiform discharges seen throughout the recording. MRI brain 8/3 > Unchanged appearance of  acute R MCA territory infarct. No new site of acute ischemia. Unchanged generalized atrophy and findings of chronic ischemic microangiopathy.  MRI Brain 8/7> Unchanged appearance of subacute R MCA Territory infarct.   Micro Data:  8/3> MRSA PCR: Negative 8/5> Sputum culture: rare normal respiratory flora.   Antimicrobials:  Unasyn 8/5>>  Interim history/subjective:  As above  Objective   Blood pressure (!) 144/73, pulse 95, temperature 98.8 F (37.1 C), temperature source Axillary, resp. rate (!) 38, height 5\' 6"  (1.676 m), weight 62.4 kg, SpO2 (!) 61 %.    FiO2 (%):  [100 %] 100 %   Intake/Output Summary (Last 24 hours) at 12/12/2019 0442 Last data filed at 12/12/2019 0408 Gross per 24 hour  Intake 715.82 ml  Output 1200 ml  Net -484.18 ml   Filed Weights   12/14/2019 0731 12/10/19 0400 12/11/19 0314  Weight: 63.5 kg 62.9 kg 62.4 kg   Examination: General: Elderly male, moderate respiratory distress  HENT: ETT/OG, right carotid surgical incision noted Lungs: rhonchi, mild tachypnea  Cardiovascular: RRR, no MRG  Abdomen: Soft, non-distended, active bowel sounds  Extremities: -edema  Neuro: obtunded, pin-point pupils GU: intact    Resolved Hospital Problem list     Assessment & Plan:    Acute respiratory failure, Respiratory Insufficieny, Unable to Protect Airway  Aspiration PNA Bilateral Pleural Effusions  H/O COPD  Plan -Vent Support -Trend ABG/CXR -Completing 7 day Unasyn course -PAN Culture, Trend PCT  Encephalopathy  H/O CVA, Subacute Right MCA and Right Parietal Plan -Obtain Repeat Head CT   AKI on CKD  -baseline crt 1.1-1.3 Hypernatremia  Plan -  BMP daily -Continue Free Water   Refeeding syndrome Given history of alcohol abuse and difficult swallowing food at home, monitor for refeeding syndrome Plan -Trend BMP. Replace electrolytes as indicated    Atrial fibrillation Hypertension Hyperlipidemia CAD status post CABG Bilateral carotid  artery stenosis status post endarterectomy Plan -Heparin for DVT prophylaxis -Holding home dose losartan -Continue Plavix, ASA, Lipitor  Diabetes Plan -Continue Lantus 18 units daily -SSI -Monitor CBG  Dysphagia Plan -ST Following  Best practice:  Diet:Tube feeds. Pain/Anxiety/Delirium protocol (if indicated) VAP protocol (if indicated) DVT prophylaxis:SCD's/ Heparin. GI prophylaxis:PPI. Glucose control:SSI. Mobility:Bedrest. Code Status:Full. Family Communication:Attempted to reach family. VM left.  Disposition:ICU   Critical care time: 70 min    Hayden Pedro, AGACNP-BC [facility]  Pgr: [phone]  PCCM Pgr: [phone]

## 2019-12-12 NOTE — Procedures (Signed)
Cortrak  Person Inserting Tube:  Jacklynn Barnacle E, RD Tube Type:  Cortrak - 43 inches Tube Location:  Right nare Initial Placement:  Stomach Secured by: Bridle Technique Used to Measure Tube Placement:  Documented cm marking at nare/ corner of mouth Cortrak Secured At:  67 cm    Cortrak Tube Team Note:  Consult received to place a Cortrak feeding tube.   No x-ray is required. RN may begin using tube.   If the tube becomes dislodged please keep the tube and contact the Cortrak team at [email or web address] ([email or web address]) for replacement.  If after hours and replacement cannot be delayed, place a NG tube and confirm placement with an abdominal x-ray.   Jacklynn Barnacle, MS, RD, LDN Pager number available on Amion

## 2019-12-12 NOTE — Progress Notes (Signed)
Earlier today, the patient pulled out his NG tube.  I elected to leave it out overnight.  He was then found unresponsive around 4 AM this morning.  Deep suctioning revealed thick tan secretions.  He was intubated for airway protection.  Chest x-ray shows left upper lobe and right lower lobe process concerning for pneumonia.  CT scan of the head was negative for acute events.  Patient was transferred to the ICU for further monitoring  [place] Faraaz Wolin

## 2019-12-12 NOTE — Progress Notes (Addendum)
[facility] PCCM PROGRESS NOTE  S:79 y/o gentleman with a history of recurrent R ICA occlusion post- CEA who developed respiratory failure requiring reintubation and pneumonia. He initially failed self extubation 8/5 after about 24 hours off vent, and was again extubated 8/7 with seemingly good toleration. Then in the early am hours of 8/11, several hours after self-removal of Cortrak, he became unresponsive and hypoxemic requiring re-intubation and transfer to ICU.   O: BP (!) 172/101    Pulse (!) 109    Temp 98.3 F (36.8 C) (Axillary)    Resp 18    Ht 5\' 6"  (1.676 m)    Wt 62.4 kg    SpO2 93%    BMI 22.20 kg/m   General:  Frail elderly male on vent in NAD Neuro:  Awake, alert, non-focal.  HEENT:  [place]/AT, PERRL, no JVD Cardiovascular:  IRIR tachy Lungs:  Diminished bases, coarse crackles throughout Abdomen:  Soft, non-tender, non-distended Musculoskeletal:  No acute deformity Skin:  Intact, MMM  Repeat CT head personally reviewed: unchanged  CXR personally reviewed: ETT in proper position with no significant change in aeration compared to 8/10 film.   A/P:  Acute hypoxemic respiratory failure: likely aspiration. Deconditioning playing a significant role - Full vent support - Tolerating 5/5 PSV wean. No plans for extubation today.  - May ultimately require tracheostomy for rehab. This is his third intubation in 8 days.  Family obviously hopes to avoid this.  - VAP bundle - Empiric Unasyn - PRN fentanyl for RASS goal 0 to -1.   Carotid disease: s/p R CEA 8/3, remaining L ICA 70% stenosis.  - Management per vascular surgery - Statin  Atrial fibrillation: largely rate controlled - DAPT ASA/Plavix per neurology  DM Lantus - hold until TF restarted.  SSI  Deconditioning Malnutrition Dysphagia - Replace Coretrak - Restart TF today  Critical care time 31 mintues   Georgann Housekeeper, AGACNP-BC [place] for personal pager PCCM on call pager [phone]  12/12/2019 9:49 AM

## 2019-12-12 NOTE — Procedures (Signed)
Intubation Procedure Note  Kelly Williams  [id number]  July 05, 1940  Date:12/12/19  Time:5:13 AM   Provider Performing:Kjuan Seipp P Carlis Abbott    Procedure: Intubation ([id number])  Indication(s) Respiratory Failure  Consent Unable to obtain consent due to emergent nature of procedure.   Anesthesia Versed and Rocuronium   Time Out Verified patient identification, verified procedure, site/side was marked, verified correct patient position, special equipment/implants available, medications/allergies/relevant history reviewed, required imaging and test results available.   Sterile Technique Usual hand hygeine, masks, and gloves were used   Procedure Description Patient positioned in bed supine.  Sedation given as noted above.  Patient was intubated with endotracheal tube using Glidescope.  View was Grade 1 full glottis .  Number of attempts was 2.  Trach pressure required to advance tube in trachea. Colorimetric CO2 detector was consistent with tracheal placement.   Complications/Tolerance None; patient tolerated the procedure well. Chest X-ray is ordered to verify placement.   EBL Minimal   Specimen(s) None  Julian Hy, DO 12/12/19 5:15 AM [facility]

## 2019-12-12 NOTE — Progress Notes (Signed)
Nutrition Follow-up  DOCUMENTATION CODES:   Severe malnutrition in context of chronic illness  INTERVENTION:   Tube feeding via Cortrak: Jevity 1.2 at 65 ml/h (1560 ml per day) Prosource TF 45 ml BID  Provides 1952 kcal, 108 gm protein, 1265 ml free water daily  200 ml free water every 6 hours Total free water: 2065 ml   NUTRITION DIAGNOSIS:   Severe Malnutrition related to chronic illness (COPD, DM, ETOH abuse) as evidenced by severe fat depletion, severe muscle depletion.  Ongoing  GOAL:   Patient will meet greater than or equal to 90% of their needs  Met with TF  MONITOR:   Diet advancement, TF tolerance  REASON FOR ASSESSMENT:   Consult, Ventilator Enteral/tube feeding initiation and management  ASSESSMENT:   Pt with PMH of DM, HTN, ETOH abuse, COPD, HLD, and CKD admitted for planned redo R carotid endarterectomy then after neuro change s/p stat exploration of R CEA and R ICA thrombectomy with no intervention.  Pt discussed during ICU rounds and with RN.  Pt removed his cortrak yesterday afternoon, later re-intubated due to acute mental status change and hypoxia. Concern for PNA. Cortrak replaced today. Spoke with daughter who was a bedside. She reports that pt is a binger and does not drink all the time but when he does he binges. She keeps his fridge and pantry full of food to include microwaveable food and protein drinks but he just doesn't eat/drink much.   8/3 admit 8/4 self-extubated 8/5 re-intubated 8/6 cortrak placed  8/7 extubated  Patient is currently intubated on ventilator support MV: 8.8 L/min Temp (24hrs), Avg:98.5 F (36.9 C), Min:97.6 F (36.4 C), Max:100.1 F (37.8 C)   Medications reviewed and include: SSI, 28 units lantus daily, MVI with minerals  Labs reviewed: Na 147 CBG: 231-205 TF off: 1565-147-115-121   Diet Order:   Diet Order            Diet NPO time specified  Diet effective now                 EDUCATION NEEDS:    No education needs have been identified at this time  Skin:  Skin Assessment: Reviewed RN Assessment  Last BM:  8/9  Height:   Ht Readings from Last 1 Encounters:  12/12/2019 5' 6"  (1.676 m)    Weight:   Wt Readings from Last 1 Encounters:  12/12/19 62.4 kg    Ideal Body Weight:  64.5 kg  BMI:  Body mass index is 22.2 kg/m.  Estimated Nutritional Needs:   Kcal:  1600-1900  Protein:  95-115 grams  Fluid:  > 1.8 L/day  Kelly Williams., RD, LDN, CNSC See AMiON for contact information

## 2019-12-12 NOTE — Progress Notes (Signed)
Patient transported from 4N25 to CT and back with no complications.

## 2019-12-12 NOTE — Progress Notes (Signed)
Pt appeared weak, drowsy and lethargy today per RN day shift reported. He's able to follow simple commands. He opened his eyes when aroused  and then went back to sleep. Neuro signs checked. MD aware.   12/11/19 2000  Glasgow Coma Scale  Eye Opening 4  Best Verbal Response (NON-intubated) 4  Best Motor Response 6  Glasgow Coma Scale Score 14  NIH Stroke Scale ( + Modified Stroke Scale Criteria)   Interval Shift assessment  Level of Consciousness (1a.)    1  LOC Questions (1b. )   + 2  LOC Commands (1c. )   +  2  Best Gaze (2. )  +  (Pt did not participate to this assessment)  Visual (3. )  +  (Pt did not participate to this assessment)  Facial Palsy (4. )     1  Motor Arm, Left (5a. )   + 3  Motor Arm, Right (5b. )   + 0  Motor Leg, Left (6a. )   + 2  Motor Leg, Right (6b. )   + 1  Limb Ataxia (7. ) 0  Sensory (8. )   + 1  Best Language (9. )   + 2  Dysarthria (10. ) 2  Extinction/Inattention (11.)   + 0    Continue to monitor.  Kennyth Lose, RN

## 2019-12-12 NOTE — Significant Event (Addendum)
Rapid Response Event Note   Reason for Call :  Called d/t SpO2-60s and decreased mental status.   Initial Focused Assessment:  Pt laying in bed in respiratory distress. Pt is unresponsive to sternal rub.  HR-100, BP-128/77, RR-43, SpO2-60% on 5L HFNC. Pt placed on NRB mask with no change in SpO2. Lungs with rhonchi t/o. Deep oral suctioning done-small amount thick tan secretions suctioned out-no change in SpO2. Pt NTS'd with respiratory-thick pink-tinged/tan secretions suctioned out-SpO2 increased to 80%s. Skin cool to touch. Pupils 4 and sluggish. PCXR/ABG done.   Pt SpO2 after interventions is 97% on NRB. Pt will grimace to deep painful stimulation but is otherwise unresponsive.     Interventions:  Deep oral suctioning NTS PCXR-Slight improvement in interstitial and airspace disease, likely reflecting congestive heart failure. Infection is not excluded. ABG-7.31/58/66.4/28.9 PCCM consulted:0515 Tx to 4N ICU  Plan of Care:  Tx to 4N25   Event Summary:   MD Notified: Dr. Trula Slade notified by bedside RN and PCCM consulted and came to bedside Call [place] Time:  Dillard Essex, RN

## 2019-12-12 NOTE — Progress Notes (Signed)
OT Cancellation Note  Patient Details Name: Kelly Williams MRN: [id number] DOB: 1940-08-16   Cancelled Treatment:    Reason Eval/Treat Not Completed: Medical issues which prohibited therapy.  Pt reintubated this am and is sedated.  [name]., OTR/L Acute Rehabilitation Services Pager [phone] Office [phone]   [name] M 12/12/2019, 12:00 PM

## 2019-12-12 NOTE — Plan of Care (Signed)
Attempted to call daughter [name] to notify her of transfer to ICU; L/M on voicemail.  Julian Hy, DO 12/12/19 4:38 AM [facility]

## 2019-12-12 NOTE — Progress Notes (Signed)
eLink Physician-Brief Progress Note Patient Name: Kelly Williams DOB: 12-Sep-1940 MRN: [id number]   Date of Service  12/12/2019  HPI/Events of Note  Agitation - Request for bilateral soft wrist restraints.  eICU Interventions  Will order soft bilateral wrist restraints X 8 hours.      Intervention Category Major Interventions: Delirium, psychosis, severe agitation - evaluation and management  Taneshia Lorence Eugene 12/12/2019, 11:21 PM

## 2019-12-12 NOTE — Progress Notes (Signed)
At 03:45 am, Pt became unresponsive to painful stimuli. SPO2 60s%, tachypnea, RR 37-40s. Auscultated coarse crackles and rhonchi bilaterally. Pt was unable to cough or clear his secretion. Oral and nasal suction performed by RT and RRT, we got blood and pink yellowish and thick secretion.He was on 5 LPM of HFNC. We changed to non- rebreathing face mask 15 LPM.  At that time RRT, Mindy at bedside. RT at bedside, ABG and chest x-ray stat ordered. DR. Trula Slade notified. Agreed to transfer Pt to 4N25. Pt transferred with staff nurse, Rt and RRT and NT at 04:45am.   His SPO2 up to 94% when we transferred. Temp 98.8, BP 144/73 mmHg, RR 38, HR 90-105, Atrial fibrillation on monitor.   Kennyth Lose, RN

## 2019-12-12 NOTE — Progress Notes (Addendum)
  Progress Note    12/12/2019 8:08 AM 8 Days Post-Op  Subjective:  Sedated on vent. Daughter at bedside. States that earlier this morning he was more alert and responding to commands and able to move all extremities   Vitals:   12/12/19 0700 12/12/19 0750  BP: (!) 148/88   Pulse: 86   Resp: 18   Temp:    SpO2: 97% 97%   Physical Exam: Cardiac: regular rate and rhythm Lungs:  Intubated on vent Incisions:  Right neck incision is clean, dry and intact. No swelling or hematoma Extremities: warm and well perfused. 2+ DP bilaterally. 2+ radial pulses Neurologic: sedated  CBC    Component Value Date/Time   WBC 19.6 (H) 12/12/2019 0351   RBC 3.10 (L) 12/12/2019 0351   HGB 8.5 (L) 12/12/2019 0558   HCT 25.0 (L) 12/12/2019 0558   PLT 353 12/12/2019 0351   MCV 97.7 12/12/2019 0351   MCH 30.3 12/12/2019 0351   MCHC 31.0 12/12/2019 0351   RDW 14.1 12/12/2019 0351   LYMPHSABS 3.4 11/26/2019 0004   MONOABS 0.9 11/26/2019 0004   EOSABS 0.3 11/26/2019 0004   BASOSABS 0.1 11/26/2019 0004    BMET    Component Value Date/Time   NA 147 (H) 12/12/2019 0558   K 3.3 (L) 12/12/2019 0558   CL 105 12/12/2019 0351   CO2 28 12/12/2019 0351   GLUCOSE 122 (H) 12/12/2019 0351   BUN 39 (H) 12/12/2019 0351   CREATININE 1.35 (H) 12/12/2019 0351   CREATININE 1.34 (H) 08/24/2019 1114   CALCIUM 8.1 (L) 12/12/2019 0351   GFRNONAA 50 (L) 12/12/2019 0351   GFRNONAA 50 (L) 08/24/2019 1114   GFRAA 57 (L) 12/12/2019 0351   GFRAA 58 (L) 08/24/2019 1114    INR    Component Value Date/Time   INR 1.0 12/13/2019 0837     Intake/Output Summary (Last 24 hours) at 12/12/2019 0808 Last data filed at 12/12/2019 0700 Gross per 24 hour  Intake 705.12 ml  Output 1650 ml  Net -944.88 ml     Assessment/Plan:  79 y.o. male is s/p *redo of right carotid endarterectomy 8 Days Post-Op. Overnight patient pulled out NG tube and then was found unresponsive requiring re intubation for airway protection. NGT  remains out at this time. CT head showed no acute events. Chest x rays showing increased bilateral upper lobe processes. On Unasyn. Leukocytosis increased with WBC 19.6. Pending blood cultures. Hgb at 8.5 this morning will need to continue to monitor. Overall very deconditioned. Appreciate CCM assistance  Karoline Caldwell, [facility] [phone] 12/12/2019 8:08 AM   On fairly minimal vent settings Biggest obstacle seems to be airway control, may need to consider trach and PEG for long term rehab. Hopefully get off vent in the next few days Need tube feeds restarted asap Daughter updated at bedside  Ruta Hinds, MD [facility] Office: [phone]

## 2019-12-12 NOTE — Progress Notes (Signed)
ABG collected, sent to lab.

## 2019-12-13 LAB — BASIC METABOLIC PANEL
Anion gap: 12 (ref 5–15)
BUN: 52 mg/dL — ABNORMAL HIGH (ref 8–23)
CO2: 29 mmol/L (ref 22–32)
Calcium: 8.1 mg/dL — ABNORMAL LOW (ref 8.9–10.3)
Chloride: 108 mmol/L (ref 98–111)
Creatinine, Ser: 1.85 mg/dL — ABNORMAL HIGH (ref 0.61–1.24)
GFR calc Af Amer: 39 mL/min — ABNORMAL LOW (ref >60)
GFR calc non Af Amer: 34 mL/min — ABNORMAL LOW (ref >60)
Glucose, Bld: 143 mg/dL — ABNORMAL HIGH (ref 70–99)
Potassium: 3.8 mmol/L (ref 3.5–5.1)
Sodium: 149 mmol/L — ABNORMAL HIGH (ref 135–145)

## 2019-12-13 LAB — CBC
HCT: 25.4 % — ABNORMAL LOW (ref 39.0–52.0)
Hemoglobin: 7.9 g/dL — ABNORMAL LOW (ref 13.0–17.0)
MCH: 30.4 pg (ref 26.0–34.0)
MCHC: 31.1 g/dL (ref 30.0–36.0)
MCV: 97.7 fL (ref 80.0–100.0)
Platelets: 374 10*3/uL (ref 150–400)
RBC: 2.6 MIL/uL — ABNORMAL LOW (ref 4.22–5.81)
RDW: 14 % (ref 11.5–15.5)
WBC: 12.9 10*3/uL — ABNORMAL HIGH (ref 4.0–10.5)
nRBC: 0 % (ref 0.0–0.2)

## 2019-12-13 LAB — GLUCOSE, CAPILLARY
Glucose-Capillary: 111 mg/dL — ABNORMAL HIGH (ref 70–99)
Glucose-Capillary: 137 mg/dL — ABNORMAL HIGH (ref 70–99)
Glucose-Capillary: 149 mg/dL — ABNORMAL HIGH (ref 70–99)
Glucose-Capillary: 175 mg/dL — ABNORMAL HIGH (ref 70–99)
Glucose-Capillary: 183 mg/dL — ABNORMAL HIGH (ref 70–99)
Glucose-Capillary: 97 mg/dL (ref 70–99)

## 2019-12-13 LAB — PROCALCITONIN: Procalcitonin: 0.49 ng/mL

## 2019-12-13 MED ORDER — PIPERACILLIN-TAZOBACTAM 3.375 G IVPB
3.3750 g | Freq: Three times a day (TID) | INTRAVENOUS | Status: DC
  Administered 2019-12-13 – 2019-12-17 (×12): 3.375 g via INTRAVENOUS
  Filled 2019-12-13 (×12): qty 50

## 2019-12-13 MED ORDER — GABAPENTIN 300 MG PO CAPS
300.0000 mg | ORAL_CAPSULE | Freq: Two times a day (BID) | ORAL | Status: DC
  Administered 2019-12-13 – 2019-12-21 (×15): 300 mg
  Filled 2019-12-13 (×15): qty 1

## 2019-12-13 MED ORDER — FREE WATER
300.0000 mL | Status: DC
  Administered 2019-12-13 – 2019-12-16 (×26): 300 mL

## 2019-12-13 NOTE — Progress Notes (Signed)
Physical Therapy Treatment Patient Details Name: Kelly Williams MRN: [id number] DOB: 1941-03-13 Today's Date: 12/13/2019    History of Present Illness Pt is a 79 y/o male with PMH of stroke, HTN, DM, periphreal neuropathy, carotid disease with L carotid endarterectomy, CABG x 5, R shoulder arthroplasthy, medication noncompliance and alcohol abuse. Daughter found patient after falling and intoxicated, L facial droop, slurring speech and aggression (reports hx of mulitple falls). MRI reveals acute ischemic infarcts of R frontal and parietal lobes. MRA of neck revealed focal stenosis in the right internal carotid artery approximately 3 cm distal to the bifurcation with an adjacent 11 mm aneurysmal dilatation. Pt underwent R carotid endarterectomy on 8/3. Pt with change in neuro status after procedure and was taken back to OR for exploration with no significant findings. Pt required intubation on 8/5 due to desaturation, extubated 12/08/19.  Reintubated on 8/11 due to hypoxia and transferred to ICU    PT Comments    Pt tolerates treatment well with some tachypnea at end of session but calms down by the time of PT departure. Pt requires physical assistance to perform all functional mobility and is impulsive at times during session, reaching once for ETT and requiring verbal cues to slow mobility until PT ready command. Pt will benefit from continued acute PT POC to improve mobility quality and pulmonary function. PT continues to recommend SNF placement at this time.  Follow Up Recommendations  SNF     Equipment Recommendations  Wheelchair (measurements PT);Wheelchair cushion (measurements PT);Hospital bed (if D/C home)    Recommendations for Other Services       Precautions / Restrictions Precautions Precautions: Fall Precaution Comments: CIWA  Restrictions Weight Bearing Restrictions: No    Mobility  Bed Mobility Overal bed mobility: Needs Assistance Bed Mobility: Supine to Sit      Supine to sit: Mod assist Sit to supine: Max assist      Transfers Overall transfer level: Needs assistance Equipment used: 1 person hand held assist Transfers: Sit to/from Stand Sit to Stand: Mod assist            Ambulation/Gait Ambulation/Gait assistance: Mod assist Gait Distance (Feet): 2 Feet Assistive device: 1 person hand held assist Gait Pattern/deviations: Shuffle Gait velocity: decr Gait velocity interpretation: <1.31 ft/sec, indicative of household ambulator General Gait Details: pt laterally shuffles at the edge of bed left and right 1-2 ft at a time, requires assist for weight shift, especially to offload RLE   Stairs             Wheelchair Mobility    Modified Rankin (Stroke Patients Only) Modified Rankin (Stroke Patients Only) Pre-Morbid Rankin Score: Moderate disability Modified Rankin: Moderately severe disability     Balance Overall balance assessment: Needs assistance Sitting-balance support: Single extremity supported;Feet supported Sitting balance-Leahy Scale: Poor Sitting balance - Comments: reliant on UE support of bed or PT   Standing balance support: Bilateral upper extremity supported Standing balance-Leahy Scale: Poor Standing balance comment: min-modA to maintain static standing balance                            Cognition Arousal/Alertness: Awake/alert Behavior During Therapy: WFL for tasks assessed/performed Overall Cognitive Status: Difficult to assess Area of Impairment: Attention;Following commands;Safety/judgement;Awareness                   Current Attention Level: Sustained Memory: Decreased recall of precautions Following Commands: Follows one step commands consistently Safety/Judgement: Decreased  awareness of safety   Problem Solving: Requires verbal cues        Exercises      General Comments General comments (skin integrity, edema, etc.): pt intubated on vent, 40% FiO2 5 PEEP. RR does  increase to 42 after mobility and exercise, back in mid 30s at end of session when PT departs, RN present and aware      Pertinent Vitals/Pain Pain Assessment: Faces Faces Pain Scale: Hurts even more Pain Location: head Pain Descriptors / Indicators: Aching Pain Intervention(s): Monitored during session    Home Living                      Prior Function            PT Goals (current goals can now be found in the care plan section) Acute Rehab PT Goals Patient Stated Goal: Pt unable to state, family goal to get stronger and improve cough Progress towards PT goals: Progressing toward goals (slowly)    Frequency    Min 2X/week      PT Plan Current plan remains appropriate    Co-evaluation              AM-PAC PT "6 Clicks" Mobility   Outcome Measure  Help needed turning from your back to your side while in a flat bed without using bedrails?: A Lot Help needed moving from lying on your back to sitting on the side of a flat bed without using bedrails?: A Lot Help needed moving to and from a bed to a chair (including a wheelchair)?: A Lot Help needed standing up from a chair using your arms (e.g., wheelchair or bedside chair)?: A Lot Help needed to walk in hospital room?: A Lot Help needed climbing 3-5 steps with a railing? : Total 6 Click Score: 11    End of Session Equipment Utilized During Treatment: Oxygen Activity Tolerance: Patient tolerated treatment well Patient left: in bed;with call bell/phone within reach;with bed alarm set;with restraints reapplied;with family/visitor present;with nursing/sitter in room Nurse Communication: Mobility status PT Visit Diagnosis: Other abnormalities of gait and mobility (R26.89);Other symptoms and signs involving the nervous system (R29.898)     Time: 0962-8366 PT Time Calculation (min) (ACUTE ONLY): 21 min  Charges:  $Gait Training: 8-22 mins $Therapeutic Activity: 8-22 mins                     Zenaida Niece,  PT, DPT Acute Rehabilitation Pager: [phone]    Zenaida Niece 12/13/2019, 5:19 PM

## 2019-12-13 NOTE — Progress Notes (Signed)
SLP Cancellation Note  Patient Details Name: Kelly Williams MRN: [id number] DOB: 16-Jul-1940   Cancelled treatment:       Reason Eval/Treat Not Completed: Medical issues which prohibited therapy (remains on vent this am).    Osie Bond., M.A. [place] [facility] Pager [phone] Office [phone]  12/13/2019, 7:16 AM

## 2019-12-13 NOTE — Progress Notes (Signed)
[facility] PCCM PROGRESS NOTE  S:79 y/o gentleman with a history of recurrent R ICA occlusion post- CEA who developed respiratory failure requiring reintubation and pneumonia. He initially failed self extubation 8/5 after about 24 hours off vent, and was again extubated 8/7 with seemingly good toleration. Then in the early am hours of 8/11, several hours after self-removal of Cortrak, he became unresponsive and hypoxemic requiring re-intubation and transfer to ICU.   O: BP 132/73   Pulse 99   Temp 97.7 F (36.5 C) (Axillary)   Resp 15   Ht 5\' 6"  (1.676 m)   Wt 62 kg   SpO2 99%   BMI 22.06 kg/m   General:  Frail elderly male on vent in NAD Neuro:  Awake, alert, interactive, follows commands HEENT:  [place]/AT, no JVD, ETT In place Cardiovascular:  IRIR, no M/R/G Lungs:  Diminished bases, faint crackles Abdomen:  Soft, non-tender, non-distended Musculoskeletal:  No acute deformity Skin:  Intact, MMM  A/P:  Acute hypoxemic respiratory failure: likely aspiration. Deconditioning playing a significant role. - Continue PSV, currently on 5/5 and doing well. - Discussed with daughter regarding teeing him up as best we can for trial of one more extubation and at that point, if he fails, he will require re-intubation and tracheostomy.  She would favor trach if needed; however, when discussed with pt, he seems hesitant.  I encouraged daughter to visit at bedside today and with help of nursing staff communicate with pt to determine most appropriate and accepting plan. - VAP bundle - Continue empiric Unasyn - Push PT efforts. - PRN fentanyl for RASS goal 0 to -1, limit sedation as much as possible.  Carotid disease: s/p R CEA 8/3, remaining L ICA 70% stenosis.  - Management per vascular surgery. - Statin.  Atrial fibrillation: largely rate controlled. - DAPT ASA/Plavix per neurology.  DM. - SSI, lantus.  Deconditioning. Malnutrition. Dysphagia. - Continue TF's. - If extubated, would leave  cortrak in for now until he has enough strength to safely swallow etc.  Hypernatremia. AKI. - Increase free water per tube. - Follow BMP.  CC time: 30 min.   Montey Hora, [facility] 12/13/2019, 9:36 AM

## 2019-12-13 NOTE — Progress Notes (Signed)
[facility]  Subjective  - pt more awake today and following commands   Objective 125/61 97 97.7 F (36.5 C) (Axillary) 18 98%  Intake/Output Summary (Last 24 hours) at 12/13/2019 8413 Last data filed at 12/13/2019 2440 Gross per 24 hour  Intake 2124.69 ml  Output 800 ml  Net 1324.69 ml   Right neck incision healing Neuro moves UE LE symmetrically  Assessment/Planning: POD #9 s/p redo CEA and re exploration Progress limited by pulmonary function underlying COPD and swallowing dysfunction from cranial nerve neuropraxia as well as deconditioning and protein calorie malnutrition  VDRF per CCM.  Pt on minimal vent settings. Pneumonia secondary to aspiration and airway instrumentation.  Will leave at discretion of CCM whether or not to try another trial of extubation vs trach.Leukocytosis resolving so hopefully pneumonia is improving.  Creatinine 1.85 near baseline hypernatremia need to watch fluid balance suspect he is a little hypovolemic  Protein calorie malnutrition continue tube feeds  Daughter updated on above conditions by phone   Ruta Hinds 12/13/2019 9:06 AM --  Laboratory Lab Results: Recent Labs    12/12/19 0351 12/12/19 0351 12/12/19 0558 12/13/19 0629  WBC 19.6*  --   --  12.9*  HGB 9.4*   < > 8.5* 7.9*  HCT 30.3*   < > 25.0* 25.4*  PLT 353  --   --  374   < > = values in this interval not displayed.   BMET Recent Labs    12/12/19 0351 12/12/19 0351 12/12/19 0558 12/13/19 0629  NA 144   < > 147* 149*  K 3.5   < > 3.3* 3.8  CL 105  --   --  108  CO2 28  --   --  29  GLUCOSE 122*  --   --  143*  BUN 39*  --   --  52*  CREATININE 1.35*  --   --  1.85*  CALCIUM 8.1*  --   --  8.1*   < > = values in this interval not displayed.    COAG Lab Results  Component Value Date   INR 1.0 12/05/2019   INR 1.0 11/26/2019   No results found for: PTT

## 2019-12-13 NOTE — Plan of Care (Signed)
  Problem: Clinical Measurements: Goal: Cardiovascular complication will be avoided Outcome: Progressing   

## 2019-12-14 DIAGNOSIS — J9601 Acute respiratory failure with hypoxia: Secondary | ICD-10-CM | POA: Diagnosis not present

## 2019-12-14 DIAGNOSIS — J449 Chronic obstructive pulmonary disease, unspecified: Secondary | ICD-10-CM

## 2019-12-14 DIAGNOSIS — T17908A Unspecified foreign body in respiratory tract, part unspecified causing other injury, initial encounter: Secondary | ICD-10-CM

## 2019-12-14 LAB — GLUCOSE, CAPILLARY
Glucose-Capillary: 164 mg/dL — ABNORMAL HIGH (ref 70–99)
Glucose-Capillary: 110 mg/dL — ABNORMAL HIGH (ref 70–99)
Glucose-Capillary: 112 mg/dL — ABNORMAL HIGH (ref 70–99)
Glucose-Capillary: 143 mg/dL — ABNORMAL HIGH (ref 70–99)
Glucose-Capillary: 156 mg/dL — ABNORMAL HIGH (ref 70–99)
Glucose-Capillary: 124 mg/dL — ABNORMAL HIGH (ref 70–99)

## 2019-12-14 LAB — CBC
HCT: 27 % — ABNORMAL LOW (ref 39.0–52.0)
Hemoglobin: 8.3 g/dL — ABNORMAL LOW (ref 13.0–17.0)
MCH: 30.3 pg (ref 26.0–34.0)
MCHC: 30.7 g/dL (ref 30.0–36.0)
MCV: 98.5 fL (ref 80.0–100.0)
Platelets: 406 10*3/uL — ABNORMAL HIGH (ref 150–400)
RBC: 2.74 MIL/uL — ABNORMAL LOW (ref 4.22–5.81)
RDW: 14.2 % (ref 11.5–15.5)
WBC: 11.7 10*3/uL — ABNORMAL HIGH (ref 4.0–10.5)
nRBC: 0 % (ref 0.0–0.2)

## 2019-12-14 LAB — PROCALCITONIN: Procalcitonin: 0.4 ng/mL

## 2019-12-14 LAB — BASIC METABOLIC PANEL
Anion gap: 10 (ref 5–15)
BUN: 50 mg/dL — ABNORMAL HIGH (ref 8–23)
CO2: 28 mmol/L (ref 22–32)
Calcium: 7.6 mg/dL — ABNORMAL LOW (ref 8.9–10.3)
Chloride: 105 mmol/L (ref 98–111)
Creatinine, Ser: 1.62 mg/dL — ABNORMAL HIGH (ref 0.61–1.24)
GFR calc Af Amer: 46 mL/min — ABNORMAL LOW (ref >60)
GFR calc non Af Amer: 40 mL/min — ABNORMAL LOW (ref >60)
Glucose, Bld: 164 mg/dL — ABNORMAL HIGH (ref 70–99)
Potassium: 3.9 mmol/L (ref 3.5–5.1)
Sodium: 143 mmol/L (ref 135–145)

## 2019-12-14 MED ORDER — INSULIN GLARGINE 100 UNIT/ML ~~LOC~~ SOLN
20.0000 [IU] | Freq: Every day | SUBCUTANEOUS | Status: DC
  Administered 2019-12-15: 20 [IU] via SUBCUTANEOUS
  Filled 2019-12-14 (×2): qty 0.2

## 2019-12-14 MED ORDER — HEPARIN SODIUM (PORCINE) 5000 UNIT/ML IJ SOLN
5000.0000 [IU] | Freq: Three times a day (TID) | INTRAMUSCULAR | Status: DC
  Administered 2019-12-14 – 2019-12-29 (×43): 5000 [IU] via SUBCUTANEOUS
  Filled 2019-12-14 (×43): qty 1

## 2019-12-14 MED ORDER — BUDESONIDE 0.5 MG/2ML IN SUSP
0.5000 mg | Freq: Two times a day (BID) | RESPIRATORY_TRACT | Status: DC
  Administered 2019-12-14 – 2020-01-04 (×42): 0.5 mg via RESPIRATORY_TRACT
  Filled 2019-12-14 (×41): qty 2

## 2019-12-14 NOTE — Progress Notes (Addendum)
NAME:  TORREY BALLINAS, MRN:  [id number], DOB:  1941-01-31, LOS: 28 ADMISSION DATE:  12/03/2019, CONSULTATION DATE: 8/11/121 REFERRING MD: Dr. Oneida Alar, CHIEF COMPLAINT: Reintubation  Brief History   79 y/o gentleman with a history of recurrent R ICA occlusion post- CEA who developed respiratory failure requiring reintubation and pneumonia. He initially failed self extubation 8/5 after about 24 hours off vent, and was again extubated 8/7 with seemingly good toleration. Then in the early am hours of 8/11, several hours after self-removal of Cortrak, he became unresponsive and hypoxemic requiring re-intubation and transfer to ICU.    Past Medical History  Hypertension Hyperlipidemia Diabetes COPD CKD Chronic alcohol abuse CAD status post CABG in 2020  [place] [facility] Events   8/3: Admitted to [facility], CEA, PCCM Consult for vent management.  8/4: Self extubated.  8/5: Re-intubated O/N 8/7: Extubated 8/11: Reintubation  Consults:  PCCM Neurology  Procedures:  8/3: ETT 8/3: R CEA 8/3 Exploration of R CEA and R ICA thrombectomy 8/3: R art line  Significant Diagnostic Tests:  Carotid duplex 8/3 > right ICA occlusion. 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 Persistent narrowing and irregularity of the distal ICA with increased eccentric intraluminal filling defect, which may reflect unstable, migrated clot. 60-70% stenosis of the proximal left ICA. No proximal intracranial vessel occlusion. Stable findings of multifocal intracranial atherosclerosis.  EEG 8/3 >Severe diffuse encephalopathy, nonspecific etiology but likely related to sedation. NO seizures or epileptiform discharges seen throughout the recording. MRI brain 8/3 >Unchanged appearance of acute R MCA territory infarct. No new site of acute ischemia. Unchanged generalized atrophy and findings of chronic  ischemic microangiopathy.  MRI Brain 8/7>Unchanged appearance of subacute R MCA Territory infarct CT head 8/11> stable appearance of subacute and chronic right MCA infarcts.  No acute intracranial abnormality or significant interval change.  Micro Data:  8/3> MRSA PCR: Negative 8/5> Sputum culture:rare normal respiratory flora.   Antimicrobials:  Zosyn 8/13>>  Interim history/subjective:  Patient is seen at bedside today.  He is awake and alert and able to follow commands.  Normal movements of bilateral upper and lower extremities.  Plan is to place his trach on Monday.  Objective   Blood pressure (!) 156/77, pulse 98, temperature 99.6 F (37.6 C), temperature source Axillary, resp. rate (!) 28, height 5\' 6"  (1.676 m), weight 62.2 kg, SpO2 96 %.    Vent Mode: PSV;CPAP FiO2 (%):  [40 %] 40 % Set Rate:  [18 bmp] 18 bmp Vt Set:  [510 mL] 510 mL PEEP:  [5 cmH20] 5 cmH20 Pressure Support:  [5 cmH20-10 cmH20] 10 cmH20 Plateau Pressure:  [16 cmH20-19 cmH20] 16 cmH20   Intake/Output Summary (Last 24 hours) at 12/14/2019 0944 Last data filed at 12/14/2019 0800 Gross per 24 hour  Intake 2334.37 ml  Output 1000 ml  Net 1334.37 ml   Filed Weights   12/12/19 0500 12/13/19 0500 12/14/19 0500  Weight: 62.4 kg 62 kg 62.2 kg    Physical Exam Constitutional:      General: He is not in acute distress.    Appearance: He is not toxic-appearing.     Comments: Awake and alert No acute distress  HENT:     Head: Normocephalic.  Eyes:     General: No scleral icterus.       Right eye: No discharge.        Left eye: No discharge.  Conjunctiva/sclera: Conjunctivae normal.  Cardiovascular:     Rate and Rhythm: Normal rate. Rhythm irregular.     Heart sounds: No murmur heard.   Pulmonary:     Effort: No respiratory distress.     Comments: Some rhonchi noted likely due to secretion Abdominal:     Palpations: Abdomen is soft.  Musculoskeletal:     Cervical back: Normal range of  motion.     Right lower leg: No edema.     Left lower leg: No edema.  Skin:    General: Skin is warm.     Coloration: Skin is not jaundiced.  Neurological:     Mental Status: He is alert.     Comments: Awake and alert PERRLA Able to move all extremities     Resolved Hospital Problem list     Assessment & Plan:  Acute hypoxic/hypercapnic respiratory failure likely due to aspiration pneumonia Bilateral carotid disease status post right CEA, with left ICA stenosis at 70% AKI on CKD 3 Chronic A. fib with controlled rate Diabetes type 2 Hypernatremia, improved Hypokalemia, improved Acute on chronic anemia, improved  Plan:  Acute hypoxic/hypercapnic respiratory failure likely due to aspiration pneumonia PSV 5/10 Continue PSV and SBT as tolerated.  Barrier for extubation including multiple failure due to inability to control secretion and dysphagia.  Family discussed regarding goals of care and plan is to proceed with tracheostomy on Monday -Monitor for aspiration -Zosyn for 7 days for aspiration pneumonia (2/7) patient has been afebrile in the last 48 hours.  White count also coming down.  Pro-Cal 0.4 -Blood culture shows no growth on day 2 -Not on sedation.  Fentanyl and Versed as needed for pain or agitation.  Limits use as much as possible. -Nebulizer as needed   Bilateral carotid disease status post right CEA, with left ICA stenosis at 70% Vascular surgery is following.   -Continue aspirin, Plavix, and statin -Heparin for DVT prophylaxis   AKI on CKD 3 Hypernatremia-stable Hypokalemia-stable Baseline creatinine around 1.2.  Creatinine trending down from 1.85-1.62.  FeNa calculated before indicates prerenal secondary to poor p.o. intake and dehydration.  Patient make about 1 L of urine yesterday. -Continue tube feed -Continue free water flushes 300 cc every 3.  Monitor sodium daily -Avoid nephrotoxic medications -Monitor I/O   Chronic atrial fibrillation Currently  rate controlled.  His Chads score is 6.  Consider starting anticoagulation depends on patient status and goals of care.  Currently on dual platelet therapy. -Metoprolol as needed for tachycardia -Heparin for DVT prophylaxis   Anemia of chronic disease Hemoglobin stable, improved from 7.9 - 8.3 today.  Continue to monitor H&H   Diabetes CBG was in good range.  -Lantus 20 units daily  -Sliding scale insulin -Monitor CBG -Gabapentin for neuropathy   Diet: Tube feet Pain/Anxiety/Delirium protocol (if indicated): Fentanyl and Versed as as needed VAP protocol (if indicated): Yes DVT prophylaxis: SCD, heparin GI prophylaxis: PPI Glucose control: Lantus, SSI Mobility: Bed rest Code Status: Full Family Communication: We will update family Disposition: ICU for ventilator management  Critical care time: 25 min     Gaylan Gerold, DO Internal Medicine Residency My pager: [phone]

## 2019-12-14 NOTE — Progress Notes (Addendum)
Progress Note    12/14/2019 7:51 AM 10 Days Post-Op  Subjective:  Awake on vent follows commands well. Discussed with attendant RN    Vitals:   12/14/19 0730 12/14/19 0733  BP: (!) 140/58   Pulse: 91   Resp: 20   Temp:    SpO2: 99% 100%    Physical Exam: Cardiac:  Irregular rhythm, reg rate Lungs:  CTAB Incisions:  Right neck incision healing Extremities:  Moves all well.  Abdomen:  Soft, ND  CBC    Component Value Date/Time   WBC 11.7 (H) 12/14/2019 0427   RBC 2.74 (L) 12/14/2019 0427   HGB 8.3 (L) 12/14/2019 0427   HCT 27.0 (L) 12/14/2019 0427   PLT 406 (H) 12/14/2019 0427   MCV 98.5 12/14/2019 0427   MCH 30.3 12/14/2019 0427   MCHC 30.7 12/14/2019 0427   RDW 14.2 12/14/2019 0427   LYMPHSABS 3.4 11/26/2019 0004   MONOABS 0.9 11/26/2019 0004   EOSABS 0.3 11/26/2019 0004   BASOSABS 0.1 11/26/2019 0004    BMET    Component Value Date/Time   NA 143 12/14/2019 0427   K 3.9 12/14/2019 0427   CL 105 12/14/2019 0427   CO2 28 12/14/2019 0427   GLUCOSE 164 (H) 12/14/2019 0427   BUN 50 (H) 12/14/2019 0427   CREATININE 1.62 (H) 12/14/2019 0427   CREATININE 1.34 (H) 08/24/2019 1114   CALCIUM 7.6 (L) 12/14/2019 0427   GFRNONAA 40 (L) 12/14/2019 0427   GFRNONAA 50 (L) 08/24/2019 1114   GFRAA 46 (L) 12/14/2019 0427   GFRAA 58 (L) 08/24/2019 1114     Intake/Output Summary (Last 24 hours) at 12/14/2019 0751 Last data filed at 12/14/2019 0600 Gross per 24 hour  Intake 2739.69 ml  Output 1000 ml  Net 1739.69 ml    HOSPITAL MEDICATIONS Scheduled Meds: . aspirin  81 mg Per Tube Daily  . atorvastatin  80 mg Per Tube QHS  . bethanechol  10 mg Per Tube TID  . chlorhexidine gluconate (MEDLINE KIT)  15 mL Mouth Rinse BID  . Chlorhexidine Gluconate Cloth  6 each Topical Q0600  . clopidogrel  75 mg Per Tube Daily  . feeding supplement (PROSource TF)  45 mL Per Tube BID  . fentaNYL (SUBLIMAZE) injection  100 mcg Intravenous Once  . free water  300 mL Per Tube Q3H   . gabapentin  300 mg Per Tube BID  . insulin aspart  0-15 Units Subcutaneous Q4H  . insulin glargine  28 Units Subcutaneous Daily  . ipratropium-albuterol  3 mL Nebulization Q4H  . mouth rinse  15 mL Mouth Rinse 10 times per day  . multivitamin with minerals  1 tablet Per Tube Daily  . pantoprazole sodium  40 mg Per Tube QHS   Continuous Infusions: . sodium chloride 10 mL/hr at 12/14/19 0600  . feeding supplement (JEVITY 1.2 CAL) 1,000 mL (12/13/19 1530)  . piperacillin-tazobactam (ZOSYN)  IV 3.375 g (12/14/19 0608)   PRN Meds:.sodium chloride, acetaminophen **OR** acetaminophen, docusate, fentaNYL (SUBLIMAZE) injection, fentaNYL (SUBLIMAZE) injection, hydrALAZINE, ipratropium-albuterol, loperamide HCl, metoprolol tartrate, midazolam, midazolam, phenol, polyethylene glycol, potassium chloride  Assessment: s/p re-do right CEA. Plavix, ASA, statin continue. Likely aspiration pneumonia on Zosyn. Dysphagia prior to re-intubation and inability to manage secretions. Cortrak in place tolerating TFs.  Hypernatremia: serum sodium improved today  Plan: As per RN, family has consented to proceeding with tracheostomy on Monday    SANDRA SETZER, PA-C [facility] [phone] 12/14/2019  7:51 AM     Agree with above.  Patient appears to be neurologically intact.  Agree with proceeding with tracheostomy early next week to assist in weaning off of vent as deconditioning underlying COPD is preventing this as well as his swallowing difficulties.  Right neck incision healing no evidence of infection.  He will be postoperative day 13 by the time of tracheostomy and risk of overall infection should be fairly low from the tracheostomy tube.   , MD [facility] [place] Office: [phone]  

## 2019-12-15 DIAGNOSIS — R0603 Acute respiratory distress: Secondary | ICD-10-CM

## 2019-12-15 DIAGNOSIS — J9601 Acute respiratory failure with hypoxia: Secondary | ICD-10-CM | POA: Diagnosis not present

## 2019-12-15 LAB — GLUCOSE, CAPILLARY
Glucose-Capillary: 108 mg/dL — ABNORMAL HIGH (ref 70–99)
Glucose-Capillary: 141 mg/dL — ABNORMAL HIGH (ref 70–99)
Glucose-Capillary: 170 mg/dL — ABNORMAL HIGH (ref 70–99)
Glucose-Capillary: 189 mg/dL — ABNORMAL HIGH (ref 70–99)
Glucose-Capillary: 218 mg/dL — ABNORMAL HIGH (ref 70–99)
Glucose-Capillary: 98 mg/dL (ref 70–99)

## 2019-12-15 LAB — BASIC METABOLIC PANEL
Anion gap: 11 (ref 5–15)
BUN: 46 mg/dL — ABNORMAL HIGH (ref 8–23)
CO2: 29 mmol/L (ref 22–32)
Calcium: 7.8 mg/dL — ABNORMAL LOW (ref 8.9–10.3)
Chloride: 100 mmol/L (ref 98–111)
Creatinine, Ser: 1.6 mg/dL — ABNORMAL HIGH (ref 0.61–1.24)
GFR calc Af Amer: 47 mL/min — ABNORMAL LOW (ref >60)
GFR calc non Af Amer: 40 mL/min — ABNORMAL LOW (ref >60)
Glucose, Bld: 118 mg/dL — ABNORMAL HIGH (ref 70–99)
Potassium: 3.9 mmol/L (ref 3.5–5.1)
Sodium: 140 mmol/L (ref 135–145)

## 2019-12-15 LAB — CBC
HCT: 25.3 % — ABNORMAL LOW (ref 39.0–52.0)
Hemoglobin: 8 g/dL — ABNORMAL LOW (ref 13.0–17.0)
MCH: 31.1 pg (ref 26.0–34.0)
MCHC: 31.6 g/dL (ref 30.0–36.0)
MCV: 98.4 fL (ref 80.0–100.0)
Platelets: 467 10*3/uL — ABNORMAL HIGH (ref 150–400)
RBC: 2.57 MIL/uL — ABNORMAL LOW (ref 4.22–5.81)
RDW: 14 % (ref 11.5–15.5)
WBC: 12.9 10*3/uL — ABNORMAL HIGH (ref 4.0–10.5)
nRBC: 0 % (ref 0.0–0.2)

## 2019-12-15 MED ORDER — FENTANYL 2500MCG IN NS 250ML (10MCG/ML) PREMIX INFUSION
0.0000 ug/h | INTRAVENOUS | Status: DC
  Administered 2019-12-15: 25 ug/h via INTRAVENOUS
  Administered 2019-12-19: 50 ug/h via INTRAVENOUS
  Administered 2019-12-21: 75 ug/h via INTRAVENOUS
  Filled 2019-12-15 (×3): qty 250

## 2019-12-15 MED ORDER — DEXMEDETOMIDINE HCL IN NACL 400 MCG/100ML IV SOLN
0.4000 ug/kg/h | INTRAVENOUS | Status: DC
  Administered 2019-12-15: 0.4 ug/kg/h via INTRAVENOUS
  Administered 2019-12-16: 0.6 ug/kg/h via INTRAVENOUS
  Administered 2019-12-16: 0.4 ug/kg/h via INTRAVENOUS
  Administered 2019-12-18: 0.6 ug/kg/h via INTRAVENOUS
  Administered 2019-12-18: 0.8 ug/kg/h via INTRAVENOUS
  Administered 2019-12-19: 0.5 ug/kg/h via INTRAVENOUS
  Administered 2019-12-19: 0.8 ug/kg/h via INTRAVENOUS
  Administered 2019-12-20: 0.5 ug/kg/h via INTRAVENOUS
  Administered 2019-12-21: 0.6 ug/kg/h via INTRAVENOUS
  Administered 2019-12-21: 0.4 ug/kg/h via INTRAVENOUS
  Administered 2019-12-22: 0.5 ug/kg/h via INTRAVENOUS
  Filled 2019-12-15 (×6): qty 100
  Filled 2019-12-15: qty 200
  Filled 2019-12-15 (×8): qty 100

## 2019-12-15 NOTE — Progress Notes (Signed)
Patient noted with minimal output via external cath. Patient bladder scanned with 825cc noted. Per CCM, may straight cath now, if no void again later, may insert Foley cath. Patient straight cathed by this nurse and Delcie Roch RN without difficulty, 800cc amber urine noted upon insertion.

## 2019-12-15 NOTE — Progress Notes (Signed)
[facility] [place]  Subjective  - sedated on vent   Objective (!) 107/55 84 98.4 F (36.9 C) (Axillary) 18 95%  Intake/Output Summary (Last 24 hours) at 12/15/2019 0855 Last data filed at 12/15/2019 0800 Gross per 24 hour  Intake 3043.06 ml  Output 1250 ml  Net 1793.06 ml   Right neck incision clean no drainage  Assessment/Planning: POD 11 s/p redo CEA Trach on Monday Daughter updated at bedside  Ruta Hinds 12/15/2019 8:55 AM --  Laboratory Lab Results: Recent Labs    12/14/19 0427 12/15/19 0616  WBC 11.7* 12.9*  HGB 8.3* 8.0*  HCT 27.0* 25.3*  PLT 406* 467*   BMET Recent Labs    12/14/19 0427 12/15/19 0616  NA 143 140  K 3.9 3.9  CL 105 100  CO2 28 29  GLUCOSE 164* 118*  BUN 50* 46*  CREATININE 1.62* 1.60*  CALCIUM 7.6* 7.8*    COAG Lab Results  Component Value Date   INR 1.0 01/01/2020   INR 1.0 11/26/2019   No results found for: PTT

## 2019-12-15 NOTE — Progress Notes (Signed)
eLink Physician-Brief Progress Note Patient Name: Kelly Williams DOB: [birth year] MRN: [id number]   Date of Service  12/15/2019  HPI/Events of Note  Ventilator dyssynchrony with desaturation.  eICU Interventions  Precedex + Fentanyl infusions ordered.        Kelly Williams 12/15/2019, 6:17 AM

## 2019-12-15 NOTE — Progress Notes (Signed)
NAME:  Kelly Williams, MRN:  [id number], DOB:  [birth year], LOS: 49 ADMISSION DATE:  12/07/2019, CONSULTATION DATE: 8/11/121 REFERRING MD: Dr. Oneida Alar, CHIEF COMPLAINT: Reintubation  Brief History   79 y/o gentleman with a history of recurrent R ICA occlusion post- CEA who developed respiratory failure requiring reintubation and pneumonia. He initially failed self extubation 8/5 after about 24 hours off vent, and was again extubated 8/7 with seemingly good toleration. Then in the early am hours of 8/11, several hours after self-removal of Cortrak, he became unresponsive and hypoxemic requiring re-intubation and transfer to ICU.    Past Medical History  Hypertension Hyperlipidemia Diabetes COPD CKD Chronic alcohol abuse CAD status post CABG in 2020  [place] [facility] Events   8/3: Admitted to [facility], CEA, PCCM Consult for vent management.  8/4: Self extubated.  8/5: Re-intubated O/N 8/7: Extubated 8/11: Reintubation  Consults:  PCCM Neurology  Procedures:  8/3: ETT 8/3: R CEA 8/3 Exploration of R CEA and R ICA thrombectomy 8/3: R art line  Significant Diagnostic Tests:  Carotid duplex 8/3 > right ICA occlusion. 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 Persistent narrowing and irregularity of the distal ICA with increased eccentric intraluminal filling defect, which may reflect unstable, migrated clot. 60-70% stenosis of the proximal left ICA. No proximal intracranial vessel occlusion. Stable findings of multifocal intracranial atherosclerosis.  EEG 8/3 >Severe diffuse encephalopathy, nonspecific etiology but likely related to sedation. NO seizures or epileptiform discharges seen throughout the recording. MRI brain 8/3 >Unchanged appearance of acute R MCA territory infarct. No new site of acute ischemia. Unchanged generalized atrophy and findings of chronic  ischemic microangiopathy.  MRI Brain 8/7>Unchanged appearance of subacute R MCA Territory infarct CT head 8/11> stable appearance of subacute and chronic right MCA infarcts.  No acute intracranial abnormality or significant interval change.  Micro Data:  8/3> MRSA PCR: Negative 8/5> Sputum culture:rare normal respiratory flora. 8/13 > blood culture: no growth day 2  Antimicrobials:  Zosyn 8/13>>  Interim history/subjective:  Overnight: Patient was given Precedex and fentanyl for ventilator dyssynchrony with desaturation  Patient is seen at bedside with daughter.  He is sleeping.  His daughter states that patient was awake and talking to her before sleeping.  She states that he is frustrated given the ET tube in place and inability to talk.  Otherwise he has no complaints.  Plan to proceed with trach on Monday   Objective   Blood pressure 138/84, pulse 92, temperature 99.6 F (37.6 C), temperature source Oral, resp. rate 15, height 5\' 6"  (1.676 m), weight 62.2 kg, SpO2 96 %.    Vent Mode: PRVC FiO2 (%):  [40 %] 40 % Set Rate:  [18 bmp] 18 bmp Vt Set:  [510 mL] 510 mL PEEP:  [5 cmH20] 5 cmH20 Pressure Support:  [10 cmH20] 10 cmH20 Plateau Pressure:  [15 cmH20-22 cmH20] 22 cmH20   Intake/Output Summary (Last 24 hours) at 12/15/2019 0728 Last data filed at 12/15/2019 0600 Gross per 24 hour  Intake 2932 ml  Output 1250 ml  Net 1682 ml   Filed Weights   12/12/19 0500 12/13/19 0500 12/14/19 0500  Weight: 62.4 kg 62 kg 62.2 kg    Physical Exam Constitutional:      General: He is not in acute distress.    Appearance: He is not toxic-appearing.     Comments: Sleepy  HENT:  Head: Normocephalic.  Cardiovascular:     Rate and Rhythm: Normal rate. Rhythm irregular.     Heart sounds: No murmur heard.   Pulmonary:     Effort: Pulmonary effort is normal. No respiratory distress.     Breath sounds: Normal breath sounds.     Comments: Some rhonchi noted likely due to  secretion Abdominal:     General: Bowel sounds are normal. There is no distension.     Palpations: Abdomen is soft.     Tenderness: There is no abdominal tenderness.  Musculoskeletal:     Right lower leg: No edema.     Left lower leg: No edema.  Skin:    General: Skin is warm.     Coloration: Skin is not jaundiced.  Neurological:     Mental Status: He is alert.     Comments: Sleeping     Resolved Hospital Problem list     Assessment & Plan:  Acute hypoxic/hypercapnic respiratory failure likely due to aspiration pneumonia Bilateral carotid disease status post right CEA, with left ICA stenosis at 70% AKI on CKD 3 Chronic A. fib with controlled rate Diabetes type 2 Hypernatremia, improved Hypokalemia, improved Acute on chronic anemia, improved  Plan:  Acute hypoxic/hypercapnic respiratory failure likely due to aspiration pneumonia Suspected COPD PRVC 18/510/5/40 Barrier for extubation including multiple failure due to inability to control secretion and dysphagia.  Family discussed regarding goals of care and plan is to proceed with tracheostomy on Monday.   -Monitor for aspiration -Zosyn for 7 days for aspiration pneumonia (4/7) patient has been afebrile in the last 96 hours.  White count trending up from 11.2-12.9 today, likely secondary to see desat event overnight..  Pro-Cal 0.4 -Blood culture shows no growth on day 2 - Fentanyl and Versed as needed for pain or agitation.  Limits use as much as possible. -Pulmicort and DuoNebs -Continue aspirin and Plavix even though trach on Monday given the reversibility of antiplatelets.   Bilateral carotid disease status post right CEA, with left ICA stenosis at 70% Vascular surgery is following.   -Continue aspirin, Plavix, and statin -Heparin for DVT prophylaxis   AKI on CKD 3 Hypernatremia-stable Hypokalemia-stable Baseline creatinine around 1.2.  Creatinine trending.  FeNa calculated before indicates prerenal secondary to  poor p.o. intake and dehydration.  Patient make about 1250 cc of output yesterday. -Continue tube feed -Continue free water flushes 300 cc every 3.  Monitor sodium daily -Avoid nephrotoxic medications -Monitor I/O   Chronic atrial fibrillation Currently rate controlled.  His Chads score is 6.  Consider starting anticoagulation depends on patient status and goals of care.  Currently on dual platelet therapy. -Metoprolol as needed for tachycardia -Heparin for DVT prophylaxis   Anemia of chronic disease Hemoglobin stable.  Continue to monitor H&H   Diabetes CBG was in good range.  -Lantus 20 units daily  -Sliding scale insulin -Monitor CBG -Gabapentin for neuropathy   Diet: Tube feed Pain/Anxiety/Delirium protocol (if indicated): Fentanyl and Versed as as needed VAP protocol (if indicated): Yes DVT prophylaxis: SCD, heparin GI prophylaxis: PPI Glucose control: Lantus, SSI Mobility: Bed rest Code Status: Full Family Communication: Speaking with daughter Disposition: ICU for ventilator management  Critical care time: 25 min     Gaylan Gerold, DO Internal Medicine Residency My pager: [phone]

## 2019-12-16 DIAGNOSIS — J9601 Acute respiratory failure with hypoxia: Secondary | ICD-10-CM | POA: Diagnosis not present

## 2019-12-16 LAB — BASIC METABOLIC PANEL
Anion gap: 12 (ref 5–15)
BUN: 56 mg/dL — ABNORMAL HIGH (ref 8–23)
CO2: 27 mmol/L (ref 22–32)
Calcium: 7.8 mg/dL — ABNORMAL LOW (ref 8.9–10.3)
Chloride: 97 mmol/L — ABNORMAL LOW (ref 98–111)
Creatinine, Ser: 1.8 mg/dL — ABNORMAL HIGH (ref 0.61–1.24)
GFR calc Af Amer: 41 mL/min — ABNORMAL LOW (ref >60)
GFR calc non Af Amer: 35 mL/min — ABNORMAL LOW (ref >60)
Glucose, Bld: 65 mg/dL — ABNORMAL LOW (ref 70–99)
Potassium: 3.9 mmol/L (ref 3.5–5.1)
Sodium: 136 mmol/L (ref 135–145)

## 2019-12-16 LAB — CBC
HCT: 22.5 % — ABNORMAL LOW (ref 39.0–52.0)
Hemoglobin: 7.2 g/dL — ABNORMAL LOW (ref 13.0–17.0)
MCH: 31 pg (ref 26.0–34.0)
MCHC: 32 g/dL (ref 30.0–36.0)
MCV: 97 fL (ref 80.0–100.0)
Platelets: 433 10*3/uL — ABNORMAL HIGH (ref 150–400)
RBC: 2.32 MIL/uL — ABNORMAL LOW (ref 4.22–5.81)
RDW: 13.7 % (ref 11.5–15.5)
WBC: 9.9 10*3/uL (ref 4.0–10.5)
nRBC: 0 % (ref 0.0–0.2)

## 2019-12-16 LAB — GLUCOSE, CAPILLARY
Glucose-Capillary: 103 mg/dL — ABNORMAL HIGH (ref 70–99)
Glucose-Capillary: 126 mg/dL — ABNORMAL HIGH (ref 70–99)
Glucose-Capillary: 154 mg/dL — ABNORMAL HIGH (ref 70–99)
Glucose-Capillary: 172 mg/dL — ABNORMAL HIGH (ref 70–99)
Glucose-Capillary: 231 mg/dL — ABNORMAL HIGH (ref 70–99)
Glucose-Capillary: 320 mg/dL — ABNORMAL HIGH (ref 70–99)
Glucose-Capillary: 53 mg/dL — ABNORMAL LOW (ref 70–99)

## 2019-12-16 MED ORDER — DEXTROSE 50 % IV SOLN
INTRAVENOUS | Status: AC
  Administered 2019-12-16: 25 mL via INTRAVENOUS
  Filled 2019-12-16: qty 50

## 2019-12-16 MED ORDER — DEXTROSE 50 % IV SOLN: 25.0000 mL | Freq: Once | INTRAVENOUS | Status: AC

## 2019-12-16 MED ORDER — INSULIN GLARGINE 100 UNIT/ML ~~LOC~~ SOLN
15.0000 [IU] | Freq: Every day | SUBCUTANEOUS | Status: DC
  Administered 2019-12-16 – 2020-01-03 (×19): 15 [IU] via SUBCUTANEOUS
  Filled 2019-12-16 (×20): qty 0.15

## 2019-12-16 MED ORDER — FREE WATER
300.0000 mL | Freq: Two times a day (BID) | Status: DC
  Administered 2019-12-16: 300 mL

## 2019-12-16 MED ORDER — IPRATROPIUM-ALBUTEROL 0.5-2.5 (3) MG/3ML IN SOLN
3.0000 mL | Freq: Three times a day (TID) | RESPIRATORY_TRACT | Status: DC
  Administered 2019-12-16 – 2020-01-04 (×56): 3 mL via RESPIRATORY_TRACT
  Filled 2019-12-16 (×56): qty 3

## 2019-12-16 NOTE — Progress Notes (Signed)
NAME:  Kelly Williams, MRN:  [id number], DOB:  Aug 30, 1940, LOS: 12 ADMISSION DATE:  12/25/2019, CONSULTATION DATE:  12/12/2019 REFERRING MD:  Oneida Alar, CHIEF COMPLAINT:  Vent management for acute respiratory failure   Brief History   79 y/o gentleman with a history of recurrent R ICA occlusion post- CEA who developed respiratory failure requiring reintubation and pneumonia. He initially failed self extubation 8/5 after about 24 hours off vent, and was again extubated 8/7 with seemingly good toleration. Then in the early am hours of 8/11, several hours after self-removal of Cortrak, he became unresponsive and hypoxemic requiring re-intubation and transfer to ICU.  Patient scheduled for trach placement 8/16  Past Medical History  Hypertension Hyperlipidemia Diabetes COPD CKD Chronic alcohol abuse CAD status post CABG in 2020  [place] [facility] Events   8/3: Admitted to [facility], CEA, PCCM Consult for vent management.  8/4: Self extubated.  8/5: Re-intubated O/N 8/7: Extubated 8/11: Reintubation  Consults:  Neurology  Procedures:  8/3: ETT 8/3: R CEA 8/3 Exploration of R CEA and R ICA thrombectomy 8/3: R art line  Significant Diagnostic Tests:  Carotid duplex 8/3 > right ICA occlusion. 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 Persistent narrowing and irregularity of the distal ICA with increased eccentric intraluminal filling defect, which may reflect unstable, migrated clot. 60-70% stenosis of the proximal left ICA. No proximal intracranial vessel occlusion. Stable findings of multifocal intracranial atherosclerosis.  EEG 8/3 >Severe diffuse encephalopathy, nonspecific etiology but likely related to sedation. NO seizures or epileptiform discharges seen throughout the recording. MRI brain 8/3 >Unchanged appearance of acute R MCA territory infarct. No new site of  acute ischemia. Unchanged generalized atrophy and findings of chronic ischemic microangiopathy.  MRI Brain 8/7>Unchanged appearance of subacute R MCA Territory infarct CT head 8/11> stable appearance of subacute and chronic right MCA infarcts.  No acute intracranial abnormality or significant interval change.  Micro Data:  8/3> MRSA PCR: Negative 8/5> Sputum culture:rare normal respiratory flora. 8/13 > blood culture: no growth day 2  Antimicrobials:  Zosyn 8/13>   Interim history/subjective:  Pt indicates no c/o  Objective   Blood pressure 110/80, pulse 79, temperature 98.2 F (36.8 C), temperature source Axillary, resp. rate (!) 23, height 5' 6"  (1.676 m), weight 62.2 kg, SpO2 97 %.    Vent Mode: PRVC FiO2 (%):  [40 %] 40 % Set Rate:  [18 bmp] 18 bmp Vt Set:  [510 mL] 510 mL PEEP:  [5 cmH20] 5 cmH20 Pressure Support:  [15 cmH20] 15 cmH20 Plateau Pressure:  [18 cmH20-24 cmH20] 24 cmH20   Intake/Output Summary (Last 24 hours) at 12/16/2019 1851 Last data filed at 12/16/2019 1800 Gross per 24 hour  Intake 1804.94 ml  Output 605 ml  Net 1199.94 ml   Filed Weights   12/12/19 0500 12/13/19 0500 12/14/19 0500  Weight: 62.4 kg 62 kg 62.2 kg    Examination: General: esting comfortably on vent HENT: [place]/AT; PRRL ETT/OGT in place Lungs: Few scattered rhonchi Cardiovascular: Irreg but w/o m/r/g Abdomen: Supple, no guarding, +BS Extremities: No c/c/e Neuro: No focal deficits   Assessment & Plan:  1) Acute Respiratory Failure Currently driven by inability to handle secretions.On for trach 8/16. As ventilatory status is not significantly compromised currently and patient does well on PSV, will keep patient on that mode as long as tolerated. 2) AKI on CKD Renal indices higher today. Overall  pos. On I/Os/ Continue to trend 3) Normocytic, normochromic anemia -likely related to chronic disease -Trend. Tx for Hb<7  Best practice:  Diet: TF. Will need to hold for  surg Pain/Anxiety/Delirium protocol (if indicated): Fentanyl/Versed prn VAP protocol (if indicated): Yes DVT prophylaxis: SCD, heparin GI prophylaxis: PPI Glucose control: Lantus, SSI Mobility: BR Code Status: Full Family Communication: Spoke with daughter Disposition: ICU  Labs   CBC: Recent Labs  Lab 12/12/19 0351 12/12/19 0351 12/12/19 0558 12/13/19 0629 12/14/19 0427 12/15/19 0616 12/16/19 0317  WBC 19.6*  --   --  12.9* 11.7* 12.9* 9.9  HGB 9.4*   < > 8.5* 7.9* 8.3* 8.0* 7.2*  HCT 30.3*   < > 25.0* 25.4* 27.0* 25.3* 22.5*  MCV 97.7  --   --  97.7 98.5 98.4 97.0  PLT 353  --   --  374 406* 467* 433*   < > = values in this interval not displayed.    Basic Metabolic Panel: Recent Labs  Lab 12/10/19 0359 12/11/19 0141 12/12/19 0351 12/12/19 0351 12/12/19 0558 12/13/19 0629 12/14/19 0427 12/15/19 0616 12/16/19 0317  NA 146*   < > 144   < > 147* 149* 143 140 136  K 3.7   < > 3.5   < > 3.3* 3.8 3.9 3.9 3.9  CL 112*   < > 105  --   --  108 105 100 97*  CO2 24   < > 28  --   --  29 28 29 27   GLUCOSE 139*   < > 122*  --   --  143* 164* 118* 65*  BUN 45*   < > 39*  --   --  52* 50* 46* 56*  CREATININE 1.52*   < > 1.35*  --   --  1.85* 1.62* 1.60* 1.80*  CALCIUM 7.5*   < > 8.1*  --   --  8.1* 7.6* 7.8* 7.8*  MG 2.0  --  2.4  --   --   --   --   --   --   PHOS 3.5  --  3.5  --   --   --   --   --   --    < > = values in this interval not displayed.   GFR: Estimated Creatinine Clearance: 29.3 mL/min (A) (by C-G formula based on SCr of 1.8 mg/dL (H)). Recent Labs  Lab 12/12/19 0351 12/12/19 0351 12/12/19 0717 12/13/19 0629 12/14/19 0427 12/15/19 0616 12/16/19 0317  PROCALCITON <0.10  --   --  0.49 0.40  --   --   WBC 19.6*   < >  --  12.9* 11.7* 12.9* 9.9  LATICACIDVEN  --   --  1.1  --   --   --   --    < > = values in this interval not displayed.    Liver Function Tests: No results for input(s): AST, ALT, ALKPHOS, BILITOT, PROT, ALBUMIN in the last 168  hours. No results for input(s): LIPASE, AMYLASE in the last 168 hours. No results for input(s): AMMONIA in the last 168 hours.  ABG    Component Value Date/Time   PHART 7.430 12/12/2019 0558   PCO2ART 48.3 (H) 12/12/2019 0558   PO2ART 321 (H) 12/12/2019 0558   HCO3 32.0 (H) 12/12/2019 0558   TCO2 33 (H) 12/12/2019 0558   ACIDBASEDEF 2.0 12/09/2019 1025   O2SAT 100.0 12/12/2019 0558     Coagulation Profile: No results for  input(s): INR, PROTIME in the last 168 hours.  Cardiac Enzymes: No results for input(s): CKTOTAL, CKMB, CKMBINDEX, TROPONINI in the last 168 hours.  HbA1C: Hemoglobin A1C  Date/Time Value Ref Range Status  06/14/2019 09:18 AM 5.8 (A) 4.0 - 5.6 % Final   HbA1c, POC (prediabetic range)  Date/Time Value Ref Range Status  01/12/2019 04:58 PM 5.9 5.7 - 6.4 % Final  10/27/2018 01:36 PM 6.0 5.7 - 6.4 % Final   Hgb A1c MFr Bld  Date/Time Value Ref Range Status  11/27/2019 03:13 AM 6.3 (H) 4.8 - 5.6 % Final    Comment:    (NOTE) Pre diabetes:          5.7%-6.4%  Diabetes:              >6.4%  Glycemic control for   <7.0% adults with diabetes     CBG: Recent Labs  Lab 12/16/19 0345 12/16/19 0416 12/16/19 0803 12/16/19 1219 12/16/19 1613  GLUCAP 53* 126* 103* 172* 320*    Past Medical History  He,  has a past medical history of Alcohol abuse, Alcohol abuse, Arthritis, Carotid arterial disease ([place]), Chronic kidney disease, COPD (chronic obstructive pulmonary disease) ([place]), Coronary artery disease, Diabetes mellitus without complication ([place]), Diabetic retinopathy ([place]), Hyperlipidemia, Hypertension, Normocytic anemia (01/16/2019), and Stroke ([place]).   Surgical History    Past Surgical History:  Procedure Laterality Date  . APPENDECTOMY     as teenager  . BACK SURGERY    . CAROTID ENDARTERECTOMY Right   . CORONARY ARTERY BYPASS GRAFT     10-12 years ago in [place] at [facility]  . ENDARTERECTOMY Right 12/17/2019   Procedure: EXPLORATION  OF NECK, PLACEMENT OF 39fBLAKE DRAIN;  Surgeon: DAngelia Mould MD;  [facility]  Service: Vascular;  Laterality: Right;  . ENDARTERECTOMY Right 12/11/2019   Procedure: REDO RIGHT CAROTID ENDARTERECTOMY;  Surgeon: FElam Dutch MD;  [facility];  Service: Vascular;  Laterality: Right;  . IR PATIENT EVAL TECH 0-60 MINS  11/27/2019  . PATCH ANGIOPLASTY Right 12/03/2019   Procedure: PATCH ANGIOPLASTY USING HEMASHIELD PLATINUM FINESSE CARDIOVASCULAR PATCH;  Surgeon: FElam Dutch MD;  [facility];  Service: Vascular;  Laterality: Right;  . REVERSE SHOULDER ARTHROPLASTY Right 07/18/2019   Procedure: REVERSE SHOULDER ARTHROPLASTY WITH SUBSCAPULAR REPAIR;  Surgeon: VHiram Gash MD;  [facility];  Service: Orthopedics;  Laterality: Right;  . THROMBECTOMY BRACHIAL ARTERY Right 12/28/2019   Procedure: THROMBECTOMY CAROTID ARTERY;  Surgeon: DAngelia Mould MD;  [facility]  Service: Vascular;  Laterality: Right;  .Marland KitchenVASECTOMY       Social History   reports that he quit smoking about 17 years ago. His smoking use included cigarettes. He has a 10.00 pack-year smoking history. He has never used smokeless tobacco. He reports previous alcohol use. He reports that he does not use drugs.   Family History   His family history includes Hypertension in his father and mother.   Allergies Allergies  Allergen Reactions  . Metformin And Related Diarrhea     Home Medications  Prior to Admission medications   Medication Sig Start Date End Date Taking? Authorizing Provider  acetaminophen (TYLENOL) 500 MG tablet Take 1,000 mg by mouth every 6 (six) hours as needed for mild pain.   Yes [provider]  albuterol (PROVENTIL HFA;VENTOLIN HFA) 108 (90 Base) MCG/ACT inhaler Inhale 1-2 puffs into the lungs every 4 (four) hours as needed for wheezing or shortness of  breath (bronchospasm). 06/20/18  Yes Trixie Dredge, PA-C  Alcohol Swabs (B-D SINGLE USE SWABS  REGULAR) PADS Use to check glucose daily Patient taking differently: 1 each by Other route See admin instructions. Use to check glucose daily 09/07/19  Yes Luetta Nutting, DO  aspirin EC 325 MG EC tablet Take 1 tablet (325 mg total) by mouth daily. 11/30/19  Yes Shelly Coss, MD  atorvastatin (LIPITOR) 80 MG tablet Take 1 tablet (80 mg total) by mouth at bedtime. 11/29/19  Yes Shelly Coss, MD  bismuth subsalicylate (PEPTO BISMOL) 262 MG/15ML suspension Take 30 mLs by mouth every 6 (six) hours as needed for indigestion or diarrhea or loose stools.   Yes [provider]  Blood Glucose Monitoring Suppl (TRUE METRIX AIR GLUCOSE METER) DEVI Dx DM E11.9 Check fasting blood sugar every morning and once 2 hours after largest meal of the day. Patient taking differently: 1 each by Other route See admin instructions. Dx DM E11.9 Check fasting blood sugar every morning and once 2 hours after largest meal of the day. 09/13/19  Yes Luetta Nutting, DO  celecoxib (CELEBREX) 100 MG capsule Take 1 capsule (100 mg total) by mouth 2 (two) times daily as needed for mild pain. Patient taking differently: Take 200 mg by mouth 2 (two) times daily as needed for mild pain.  09/10/19  Yes Luetta Nutting, DO  clopidogrel (PLAVIX) 75 MG tablet Take 1 tablet (75 mg total) by mouth daily. 11/30/19  Yes Adhikari, Tamsen Meek, MD  Dulaglutide (TRULICITY) 5.82 PP/8.9QM SOPN Inject 0.75 mg into the skin once a week. Patient taking differently: Inject 0.75 mg into the skin once a week. Saturday 09/10/19  Yes Luetta Nutting, DO  gabapentin (NEURONTIN) 300 MG capsule Take 1 capsule (300 mg total) by mouth 3 (three) times daily. 09/07/19  Yes Luetta Nutting, DO  glucose blood (TRUE METRIX BLOOD GLUCOSE TEST) test strip Use as instructed Patient taking differently: 1 each by Other route as directed.  09/07/19  Yes Luetta Nutting, DO  ipratropium-albuterol (DUONEB) 0.5-2.5 (3) MG/3ML SOLN Take 3 mLs by nebulization every 6 (six) hours as  needed. Patient taking differently: Take 3 mLs by nebulization every 6 (six) hours as needed (COPD).  10/27/18  Yes Trixie Dredge, PA-C  loperamide (IMODIUM) 2 MG capsule Take 1 capsule (2 mg total) by mouth every 8 (eight) hours as needed for diarrhea or loose stools. 11/29/19  Yes Shelly Coss, MD  losartan (COZAAR) 25 MG tablet Take 1 tablet (25 mg total) by mouth every morning. Patient taking differently: Take 25 mg by mouth at bedtime.  06/25/19  Yes Buford Dresser, MD  Respiratory Therapy Supplies (NEBULIZER/TUBING/MOUTHPIECE) KIT Use as directed with nebulizer treatments (Duoneb) every 4 hours prn. Dx: J44.9 Patient taking differently: 1 each by Other route See admin instructions. Use as directed with nebulizer treatments (Duoneb) every 4 hours prn. Dx: J44.9 06/20/18  Yes Trixie Dredge, PA-C  TRUEplus Lancets 30G MISC Use to check glucose daily Patient taking differently: 1 each by Other route daily.  09/07/19  Yes Luetta Nutting, DO  Tiotropium Bromide-Olodaterol (STIOLTO RESPIMAT) 2.5-2.5 MCG/ACT AERS Inhale 2 puffs into the lungs daily. Patient not taking: Reported on 11/26/2019 10/27/18   Trixie Dredge, PA-C     Critical care time: 20 min

## 2019-12-16 NOTE — Progress Notes (Signed)
[facility]  Subjective  - awake following commands   Objective 117/73 79 98.5 F (36.9 C) (Oral) (!) 23 95%  Intake/Output Summary (Last 24 hours) at 12/16/2019 0826 Last data filed at 12/16/2019 0800 Gross per 24 hour  Intake 1757.16 ml  Output 1145 ml  Net 612.16 ml   Right neck incision healing Neuro UE LE 5/5 motor  Assessment/Planning: Pod 12 redo right CEA  Trach tomorrow to assist vent wean CCM  Apparently hypoglycemic last night not sure how that would be possible on tube feeds may need to consider reduction in insulin dosing will leave to CCM  Acute blood loss anemia hgb 7.2 today would have low threshold to transfuse considering his overall co morbidities and oxygenation issues. Will leave to [facility] 12/16/2019 8:26 AM --  Laboratory Lab Results: Recent Labs    12/15/19 0616 12/16/19 0317  WBC 12.9* 9.9  HGB 8.0* 7.2*  HCT 25.3* 22.5*  PLT 467* 433*   BMET Recent Labs    12/15/19 0616 12/16/19 0317  NA 140 136  K 3.9 3.9  CL 100 97*  CO2 29 27  GLUCOSE 118* 65*  BUN 46* 56*  CREATININE 1.60* 1.80*  CALCIUM 7.8* 7.8*    COAG Lab Results  Component Value Date   INR 1.0 12/28/2019   INR 1.0 11/26/2019   No results found for: PTT

## 2019-12-17 LAB — SURGICAL PCR SCREEN
MRSA, PCR: NEGATIVE
Staphylococcus aureus: NEGATIVE

## 2019-12-17 LAB — CBC
HCT: 18.4 % — ABNORMAL LOW (ref 39.0–52.0)
HCT: 28.9 % — ABNORMAL LOW (ref 39.0–52.0)
Hemoglobin: 6 g/dL — CL (ref 13.0–17.0)
Hemoglobin: 9.7 g/dL — ABNORMAL LOW (ref 13.0–17.0)
MCH: 31.1 pg (ref 26.0–34.0)
MCH: 31.1 pg (ref 26.0–34.0)
MCHC: 32.6 g/dL (ref 30.0–36.0)
MCHC: 33.6 g/dL (ref 30.0–36.0)
MCV: 95.3 fL (ref 80.0–100.0)
MCV: 92.6 fL (ref 80.0–100.0)
Platelets: 472 10*3/uL — ABNORMAL HIGH (ref 150–400)
Platelets: 452 10*3/uL — ABNORMAL HIGH (ref 150–400)
RBC: 1.93 MIL/uL — ABNORMAL LOW (ref 4.22–5.81)
RBC: 3.12 MIL/uL — ABNORMAL LOW (ref 4.22–5.81)
RDW: 13.6 % (ref 11.5–15.5)
RDW: 14 % (ref 11.5–15.5)
WBC: 11.9 10*3/uL — ABNORMAL HIGH (ref 4.0–10.5)
WBC: 14.8 10*3/uL — ABNORMAL HIGH (ref 4.0–10.5)
nRBC: 0 % (ref 0.0–0.2)
nRBC: 0.1 % (ref 0.0–0.2)

## 2019-12-17 LAB — GLUCOSE, CAPILLARY
Glucose-Capillary: 108 mg/dL — ABNORMAL HIGH (ref 70–99)
Glucose-Capillary: 71 mg/dL (ref 70–99)
Glucose-Capillary: 68 mg/dL — ABNORMAL LOW (ref 70–99)
Glucose-Capillary: 106 mg/dL — ABNORMAL HIGH (ref 70–99)
Glucose-Capillary: 94 mg/dL (ref 70–99)
Glucose-Capillary: 176 mg/dL — ABNORMAL HIGH (ref 70–99)
Glucose-Capillary: 241 mg/dL — ABNORMAL HIGH (ref 70–99)

## 2019-12-17 LAB — BASIC METABOLIC PANEL
Anion gap: 13 (ref 5–15)
BUN: 71 mg/dL — ABNORMAL HIGH (ref 8–23)
CO2: 25 mmol/L (ref 22–32)
Calcium: 7.3 mg/dL — ABNORMAL LOW (ref 8.9–10.3)
Chloride: 90 mmol/L — ABNORMAL LOW (ref 98–111)
Creatinine, Ser: 2.1 mg/dL — ABNORMAL HIGH (ref 0.61–1.24)
GFR calc Af Amer: 34 mL/min — ABNORMAL LOW (ref >60)
GFR calc non Af Amer: 29 mL/min — ABNORMAL LOW (ref >60)
Glucose, Bld: 131 mg/dL — ABNORMAL HIGH (ref 70–99)
Potassium: 4.6 mmol/L (ref 3.5–5.1)
Sodium: 128 mmol/L — ABNORMAL LOW (ref 135–145)

## 2019-12-17 LAB — CULTURE, BLOOD (ROUTINE X 2)
Culture: NO GROWTH
Culture: NO GROWTH
Special Requests: ADEQUATE
Special Requests: ADEQUATE

## 2019-12-17 LAB — CULTURE, RESPIRATORY W GRAM STAIN

## 2019-12-17 LAB — PROTIME-INR
INR: 1.2 (ref 0.8–1.2)
Prothrombin Time: 14.7 seconds (ref 11.4–15.2)

## 2019-12-17 LAB — PREPARE RBC (CROSSMATCH)

## 2019-12-17 LAB — APTT: aPTT: 41 seconds — ABNORMAL HIGH (ref 24–36)

## 2019-12-17 MED ORDER — DEXTROSE 50 % IV SOLN
12.5000 g | INTRAVENOUS | Status: AC
  Administered 2019-12-17: 12.5 g via INTRAVENOUS
  Filled 2019-12-17: qty 50

## 2019-12-17 MED ORDER — FENTANYL CITRATE (PF) 100 MCG/2ML IJ SOLN
200.0000 ug | Freq: Once | INTRAMUSCULAR | Status: DC
  Filled 2019-12-17: qty 4

## 2019-12-17 MED ORDER — PROPOFOL 10 MG/ML IV BOLUS
500.0000 mg | Freq: Once | INTRAVENOUS | Status: DC
  Filled 2019-12-17: qty 60

## 2019-12-17 MED ORDER — SODIUM CHLORIDE 0.9 % IV SOLN
1.0000 g | Freq: Three times a day (TID) | INTRAVENOUS | Status: DC
  Administered 2019-12-17 – 2019-12-18 (×3): 1 g via INTRAVENOUS
  Filled 2019-12-17 (×5): qty 1

## 2019-12-17 MED ORDER — FUROSEMIDE 10 MG/ML IJ SOLN
40.0000 mg | Freq: Once | INTRAMUSCULAR | Status: AC
  Administered 2019-12-17: 40 mg via INTRAVENOUS

## 2019-12-17 MED ORDER — MIDAZOLAM HCL 2 MG/2ML IJ SOLN
5.0000 mg | Freq: Once | INTRAMUSCULAR | Status: DC
  Filled 2019-12-17: qty 6

## 2019-12-17 MED ORDER — SODIUM CHLORIDE 0.9% IV SOLUTION: Freq: Once | INTRAVENOUS | Status: DC

## 2019-12-17 MED ORDER — FUROSEMIDE 10 MG/ML IJ SOLN
INTRAMUSCULAR | Status: AC
  Administered 2019-12-17: 40 mg via INTRAVENOUS
  Filled 2019-12-17: qty 4

## 2019-12-17 MED ORDER — ETOMIDATE 2 MG/ML IV SOLN
40.0000 mg | Freq: Once | INTRAVENOUS | Status: DC
  Filled 2019-12-17: qty 20

## 2019-12-17 MED ORDER — VECURONIUM BROMIDE 10 MG IV SOLR
10.0000 mg | Freq: Once | INTRAVENOUS | Status: DC
  Filled 2019-12-17: qty 10

## 2019-12-17 NOTE — Progress Notes (Signed)
SLP Cancellation Note  Patient Details Name: ANDRAY ASSEFA MRN: [id number] DOB: 23-Jul-1940   Cancelled treatment:       Reason Eval/Treat Not Completed: Medical issues which prohibited therapy. Pt still intubated with plans for trach today.    Osie Bond., M.A. [facility] Pager [phone] Office [phone]  12/17/2019, 8:13 AM

## 2019-12-17 NOTE — Progress Notes (Signed)
NAME:  Kelly Williams, MRN:  [id number], DOB:  [birth year], LOS: 53 ADMISSION DATE:  12/06/2019, CONSULTATION DATE:  12/12/2019 REFERRING MD:  Oneida Alar, CHIEF COMPLAINT:  Vent management for acute respiratory failure   Brief History   79 y/o gentleman with a history of recurrent R ICA occlusion post- CEA who developed respiratory failure requiring reintubation and pneumonia. He initially failed self extubation 8/5 after about 24 hours off vent, and was again extubated 8/7 with seemingly good toleration. Then in the early am hours of 8/11, several hours after self-removal of Cortrak, he became unresponsive and hypoxemic requiring re-intubation and transfer to ICU.  Patient scheduled for trach placement 8/16  Past Medical History  Hypertension Hyperlipidemia Diabetes COPD CKD Chronic alcohol abuse CAD status post CABG in 2020  [place] Hospital Events   8/3: Admitted to [facility], CEA, PCCM Consult for vent management.  8/4: Self extubated.  8/5: Re-intubated O/N 8/7: Extubated 8/11: Reintubation  Consults:  Neurology  Procedures:  8/3: ETT 8/3: R CEA 8/3 Exploration of R CEA and R ICA thrombectomy 8/3: R art line  Significant Diagnostic Tests:  Carotid duplex 8/3 > right ICA occlusion. 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 Persistent narrowing and irregularity of the distal ICA with increased eccentric intraluminal filling defect, which may reflect unstable, migrated clot. 60-70% stenosis of the proximal left ICA. No proximal intracranial vessel occlusion. Stable findings of multifocal intracranial atherosclerosis.  EEG 8/3 >Severe diffuse encephalopathy, nonspecific etiology but likely related to sedation. NO seizures or epileptiform discharges seen throughout the recording. MRI brain 8/3 >Unchanged appearance of acute R MCA territory infarct. No new site of  acute ischemia. Unchanged generalized atrophy and findings of chronic ischemic microangiopathy.  MRI Brain 8/7>Unchanged appearance of subacute R MCA Territory infarct CT head 8/11> stable appearance of subacute and chronic right MCA infarcts.  No acute intracranial abnormality or significant interval change.  Micro Data:  8/3> MRSA PCR: Negative 8/5> Sputum culture:rare normal respiratory flora. 8/13 > blood culture: no growth day 2  Antimicrobials:  Zosyn 8/13>   Interim history/subjective:  Overloaded on exam after 1u PRBC overnight for hgb 6.  Objective   Blood pressure (!) 158/72, pulse 83, temperature 98.6 F (37 C), temperature source Axillary, resp. rate (!) 26, height 5\' 6"  (1.676 m), weight 62.2 kg, SpO2 99 %.    Vent Mode: PRVC FiO2 (%):  [40 %] 40 % Set Rate:  [18 bmp] 18 bmp Vt Set:  [510 mL] 510 mL PEEP:  [5 cmH20] 5 cmH20 Plateau Pressure:  [18 cmH20-27 cmH20] 27 cmH20   Intake/Output Summary (Last 24 hours) at 12/17/2019 0855 Last data filed at 12/17/2019 0800 Gross per 24 hour  Intake 1669.66 ml  Output 535 ml  Net 1134.66 ml   Filed Weights   12/12/19 0500 12/13/19 0500 12/14/19 0500  Weight: 62.4 kg 62 kg 62.2 kg    Examination: General: Adult male, resting comfortably on vent  HENT: [place]/AT; ETT in place, EOMI Lungs: Few scattered rhonchi Cardiovascular: Irreg but w/o m/r/g Abdomen: Supple, no guarding, +BS Extremities: No c/c/e Neuro: No focal deficits   Assessment & Plan:   Acute Respiratory Failure - s/p multiple failed extubations - Trach planned for today. - Bronchial hygiene. - Follow CXR intermittently.  Acute pulmonary edema after 1u PRBC overnight. - 40mg  lasix x 1.  AKI on CKD. Hyponatremia. - Lasix as above. -  Supportive care.  Normocytic, normochromic anemia - s/p 1u PRBC overnight 8/15. - Transfuse for Hgb < 7.  Right CEA redo. - Per vascular.  Best practice:  Diet: TF. Will need to hold for  trach Pain/Anxiety/Delirium protocol (if indicated): Fentanyl/Versed prn VAP protocol (if indicated): Yes DVT prophylaxis: SCD, heparin GI prophylaxis: PPI Glucose control: Lantus, SSI Mobility: BR Code Status: Full Family Communication: Updated daughter over weekend.  Will call today. Disposition: ICU   Critical care time: 30 min    Montey Hora, [place] 12/17/2019, 9:01 AM

## 2019-12-17 NOTE — Progress Notes (Signed)
  Progress Note    12/17/2019 7:36 AM 13 Days Post-Op  Subjective:  On vent but awake and alert   Vitals:   12/17/19 0700 12/17/19 0718  BP: (!) 103/54 99/64  Pulse: 72 70  Resp: 18 18  Temp: 98.3 F (36.8 C) 98.6 F (37 C)  SpO2: 99% 94%   Physical Exam: Lungs:  Mechanical ventilation Incisions:  R neck incision soft, healing well Neurologic: moving all extremities well  CBC    Component Value Date/Time   WBC 11.9 (H) 12/17/2019 0232   RBC 1.93 (L) 12/17/2019 0232   HGB 6.0 (LL) 12/17/2019 0232   HCT 18.4 (L) 12/17/2019 0232   PLT 472 (H) 12/17/2019 0232   MCV 95.3 12/17/2019 0232   MCH 31.1 12/17/2019 0232   MCHC 32.6 12/17/2019 0232   RDW 13.6 12/17/2019 0232   LYMPHSABS 3.4 11/26/2019 0004   MONOABS 0.9 11/26/2019 0004   EOSABS 0.3 11/26/2019 0004   BASOSABS 0.1 11/26/2019 0004    BMET    Component Value Date/Time   NA 128 (L) 12/17/2019 0232   K 4.6 12/17/2019 0232   CL 90 (L) 12/17/2019 0232   CO2 25 12/17/2019 0232   GLUCOSE 131 (H) 12/17/2019 0232   BUN 71 (H) 12/17/2019 0232   CREATININE 2.10 (H) 12/17/2019 0232   CREATININE 1.34 (H) 08/24/2019 1114   CALCIUM 7.3 (L) 12/17/2019 0232   GFRNONAA 29 (L) 12/17/2019 0232   GFRNONAA 50 (L) 08/24/2019 1114   GFRAA 34 (L) 12/17/2019 0232   GFRAA 58 (L) 08/24/2019 1114    INR    Component Value Date/Time   INR 1.0 12/15/2019 0837     Intake/Output Summary (Last 24 hours) at 12/17/2019 0736 Last data filed at 12/17/2019 8841 Gross per 24 hour  Intake 1229.56 ml  Output 505 ml  Net 724.56 ml     Assessment/Plan:  79 y.o. male is s/p redo R CEA 13 Days Post-Op   Hgb dropped to 6 overnight; being transfused 1 u pRBC AKI: poor urine output, renal labs worsening; monitoring I&O with foley in place; defer further management to CCM Trach today with CCM   Kelly Ligas, PA-C [facility] [phone] 12/17/2019 7:36 AM

## 2019-12-17 NOTE — Progress Notes (Signed)
Physical Therapy Treatment Patient Details Name: Kelly Williams MRN: [id number] DOB: May 23, 1940 Today's Date: 12/17/2019    History of Present Illness Pt is a 79 y/o male with PMH of stroke, HTN, DM, periphreal neuropathy, carotid disease with L carotid endarterectomy, CABG x 5, R shoulder arthroplasthy, medication noncompliance and alcohol abuse. Daughter found patient after falling and intoxicated, L facial droop, slurring speech and aggression (reports hx of mulitple falls). MRI reveals acute ischemic infarcts of R frontal and parietal lobes. MRA of neck revealed focal stenosis in the right internal carotid artery approximately 3 cm distal to the bifurcation with an adjacent 11 mm aneurysmal dilatation. Pt underwent R carotid endarterectomy on 8/3. Pt with change in neuro status after procedure and was taken back to OR for exploration with no significant findings. Pt required intubation on 8/5 due to desaturation, extubated 12/08/19.  Reintubated on 8/11 due to hypoxia and transferred to ICU    PT Comments    Pt limited by reports of dizziness when sitting up this session. Pt continues to demonstrate goo strength in all extremities, requiring some assistance for bed mobility. Pt declines attempts at exercise, seeming more fatigued than previous sessions and wanting to wait until after surgery today for further PT activities. PT will continue to progress mobility as tolerated.    Follow Up Recommendations  SNF     Equipment Recommendations  Wheelchair (measurements PT);Wheelchair cushion (measurements PT);Hospital bed    Recommendations for Other Services       Precautions / Restrictions Precautions Precautions: Fall Precaution Comments: CIWA  Restrictions Weight Bearing Restrictions: No    Mobility  Bed Mobility Overal bed mobility: Needs Assistance Bed Mobility: Supine to Sit;Sit to Supine     Supine to sit: Mod assist Sit to supine: Max assist   General bed mobility comments:  sit to supine requiring maxA to facilitate a quick transition due to reports of dizziness  Transfers                 General transfer comment: deferred 2/2 dizziness  Ambulation/Gait                 Stairs             Wheelchair Mobility    Modified Rankin (Stroke Patients Only) Modified Rankin (Stroke Patients Only) Pre-Morbid Rankin Score: Moderate disability Modified Rankin: Moderately severe disability     Balance Overall balance assessment: Needs assistance Sitting-balance support: Bilateral upper extremity supported;Feet supported Sitting balance-Leahy Scale: Poor Sitting balance - Comments: minG-minA with BUE support at edge of bed                                    Cognition Arousal/Alertness: Awake/alert Behavior During Therapy: WFL for tasks assessed/performed Overall Cognitive Status: Difficult to assess Area of Impairment: Following commands                       Following Commands: Follows one step commands consistently              Exercises      General Comments General comments (skin integrity, edema, etc.): pt intubated, non-verbally communicates dizziness with head nods when sitting up, request to lay down. BP stable, systolic 644I throughout session.      Pertinent Vitals/Pain Pain Assessment: No/denies pain (indicates non-verbally, shaking head no)    Home Living  Prior Function            PT Goals (current goals can now be found in the care plan section) Acute Rehab PT Goals Patient Stated Goal: Pt unable to state, family goal to get stronger and improve cough Progress towards PT goals: Not progressing toward goals - comment (limited by dizziness)    Frequency    Min 2X/week      PT Plan Current plan remains appropriate    Co-evaluation              AM-PAC PT "6 Clicks" Mobility   Outcome Measure  Help needed turning from your back to your  side while in a flat bed without using bedrails?: A Lot Help needed moving from lying on your back to sitting on the side of a flat bed without using bedrails?: A Lot Help needed moving to and from a bed to a chair (including a wheelchair)?: A Lot Help needed standing up from a chair using your arms (e.g., wheelchair or bedside chair)?: A Lot Help needed to walk in hospital room?: A Lot Help needed climbing 3-5 steps with a railing? : Total 6 Click Score: 11    End of Session Equipment Utilized During Treatment: Oxygen Activity Tolerance: Treatment limited secondary to medical complications (Comment) (dizziness) Patient left: in bed;with call bell/phone within reach;with restraints reapplied Nurse Communication: Mobility status PT Visit Diagnosis: Other abnormalities of gait and mobility (R26.89);Other symptoms and signs involving the nervous system (I29.798)     Time: 9211-9417 PT Time Calculation (min) (ACUTE ONLY): 19 min  Charges:  $Therapeutic Activity: 8-22 mins                     Zenaida Niece, PT, DPT Acute Rehabilitation Pager: [phone]    Zenaida Niece 12/17/2019, 10:29 AM

## 2019-12-17 NOTE — Progress Notes (Addendum)
Patient s/p 1 Unit PRBC.  Patient hypertensive, gurgling, increased secretions. Lungs with bilat crackles.  UO minimal via Foley. BUN/Cr. Trending up. CCM aware, Lasix given. Will monitor I&O.

## 2019-12-17 NOTE — Progress Notes (Signed)
eLink Physician-Brief Progress Note Patient Name: Kelly Williams DOB: 1940/12/30 MRN: [id number]   Date of Service  12/17/2019  HPI/Events of Note  Anemia - Hgb = 6.0.   eICU Interventions  Transfuse 1 unit PRBC.     Intervention Category Major Interventions: Other:  Lysle Dingwall 12/17/2019, 4:14 AM

## 2019-12-18 LAB — CBC
HCT: 24.1 % — ABNORMAL LOW (ref 39.0–52.0)
Hemoglobin: 7.8 g/dL — ABNORMAL LOW (ref 13.0–17.0)
MCH: 29.9 pg (ref 26.0–34.0)
MCHC: 32.4 g/dL (ref 30.0–36.0)
MCV: 92.3 fL (ref 80.0–100.0)
Platelets: 461 10*3/uL — ABNORMAL HIGH (ref 150–400)
RBC: 2.61 MIL/uL — ABNORMAL LOW (ref 4.22–5.81)
RDW: 14.2 % (ref 11.5–15.5)
WBC: 10.8 10*3/uL — ABNORMAL HIGH (ref 4.0–10.5)
nRBC: 0 % (ref 0.0–0.2)

## 2019-12-18 LAB — BASIC METABOLIC PANEL
Anion gap: 12 (ref 5–15)
BUN: 75 mg/dL — ABNORMAL HIGH (ref 8–23)
CO2: 24 mmol/L (ref 22–32)
Calcium: 7.5 mg/dL — ABNORMAL LOW (ref 8.9–10.3)
Chloride: 95 mmol/L — ABNORMAL LOW (ref 98–111)
Creatinine, Ser: 2.23 mg/dL — ABNORMAL HIGH (ref 0.61–1.24)
GFR calc Af Amer: 31 mL/min — ABNORMAL LOW (ref >60)
GFR calc non Af Amer: 27 mL/min — ABNORMAL LOW (ref >60)
Glucose, Bld: 147 mg/dL — ABNORMAL HIGH (ref 70–99)
Potassium: 3.8 mmol/L (ref 3.5–5.1)
Sodium: 131 mmol/L — ABNORMAL LOW (ref 135–145)

## 2019-12-18 LAB — TYPE AND SCREEN
ABO/RH(D): A POS
Antibody Screen: NEGATIVE
Unit division: 0

## 2019-12-18 LAB — BPAM RBC
Blood Product Expiration Date: 202108272359
ISSUE DATE / TIME: 202108160614
Unit Type and Rh: 6200

## 2019-12-18 LAB — GLUCOSE, CAPILLARY
Glucose-Capillary: 104 mg/dL — ABNORMAL HIGH (ref 70–99)
Glucose-Capillary: 107 mg/dL — ABNORMAL HIGH (ref 70–99)
Glucose-Capillary: 136 mg/dL — ABNORMAL HIGH (ref 70–99)
Glucose-Capillary: 144 mg/dL — ABNORMAL HIGH (ref 70–99)
Glucose-Capillary: 146 mg/dL — ABNORMAL HIGH (ref 70–99)
Glucose-Capillary: 183 mg/dL — ABNORMAL HIGH (ref 70–99)

## 2019-12-18 LAB — OCCULT BLOOD X 1 CARD TO LAB, STOOL: Fecal Occult Bld: POSITIVE — AB

## 2019-12-18 MED ORDER — SODIUM CHLORIDE 0.9 % IV SOLN
2.0000 g | INTRAVENOUS | Status: AC
  Administered 2019-12-19: 2 g via INTRAVENOUS
  Filled 2019-12-18: qty 2

## 2019-12-18 NOTE — Progress Notes (Signed)
NAME:  Kelly Williams, MRN:  [id number], DOB:  24-Jun-1940, LOS: 3 ADMISSION DATE:  12/27/2019, CONSULTATION DATE:  12/12/2019 REFERRING MD:  Oneida Alar, CHIEF COMPLAINT:  Vent management for acute respiratory failure   Brief History   79 y/o gentleman with a history of recurrent R ICA occlusion post- CEA who developed respiratory failure requiring reintubation and pneumonia. He initially failed self extubation 8/5 after about 24 hours off vent, and was again extubated 8/7 with seemingly good toleration. Then in the early am hours of 8/11, several hours after self-removal of Cortrak, he became unresponsive and hypoxemic requiring re-intubation and transfer to ICU.  Patient awaiting  trach placement   Past Medical History  Hypertension Hyperlipidemia Diabetes COPD CKD Chronic alcohol abuse CAD status post CABG in 2020  [place] [facility] Events   8/3: Admitted to [facility], CEA, PCCM Consult for vent management.  8/4: Self extubated.  8/5: Re-intubated O/N 8/7: Extubated 8/11: Reintubation  Consults:  Neurology  Procedures:  8/3: ETT 8/3: R CEA 8/3 Exploration of R CEA and R ICA thrombectomy 8/3: R art line  Significant Diagnostic Tests:  Carotid duplex 8/3 > right ICA occlusion. 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 Persistent narrowing and irregularity of the distal ICA with increased eccentric intraluminal filling defect, which may reflect unstable, migrated clot. 60-70% stenosis of the proximal left ICA. No proximal intracranial vessel occlusion. Stable findings of multifocal intracranial atherosclerosis.  EEG 8/3 >Severe diffuse encephalopathy, nonspecific etiology but likely related to sedation. NO seizures or epileptiform discharges seen throughout the recording. MRI brain 8/3 >Unchanged appearance of acute R MCA territory infarct. No new site of acute  ischemia. Unchanged generalized atrophy and findings of chronic ischemic microangiopathy.  MRI Brain 8/7>Unchanged appearance of subacute R MCA Territory infarct CT head 8/11> stable appearance of subacute and chronic right MCA infarcts.  No acute intracranial abnormality or significant interval change.  Micro Data:  8/3> MRSA PCR: Negative 8/5> Sputum culture:rare normal respiratory flora. 8/13 > blood culture: no growth day 2 8/13 > Respiratory Culture>> Pseudomonas Aeruginosa  Antimicrobials:  Zosyn 8/13>  Tressie Ellis 8/16>>  Interim history/subjective:  Na 131 Creatinine 2.23 up from 2.10 + 13 L since admit, Negative 600 last 24 HGB 7.8 from 9.7 WBC 10.8 Did not tolerate CPAP/ PS ( Increased RR , HR, decreased sat)  Objective   Blood pressure (!) 93/59, pulse 63, temperature 98.3 F (36.8 C), temperature source Axillary, resp. rate (!) 26, height 5\' 6"  (1.676 m), weight 62.2 kg, SpO2 98 %.    Vent Mode: PSV;CPAP FiO2 (%):  [40 %] 40 % Set Rate:  [18 bmp] 18 bmp Vt Set:  [510 mL] 510 mL PEEP:  [5 cmH20] 5 cmH20 Pressure Support:  [12 cmH20] 12 cmH20 Plateau Pressure:  [17 cmH20-30 cmH20] 17 cmH20   Intake/Output Summary (Last 24 hours) at 12/18/2019 0919 Last data filed at 12/18/2019 0800 Gross per 24 hour  Intake 524.45 ml  Output 1750 ml  Net -1225.55 ml   Filed Weights   12/12/19 0500 12/13/19 0500 12/14/19 0500  Weight: 62.4 kg 62 kg 62.2 kg    Examination: General: Adult male, on CPAP, tachypnea,agitated  HENT: [place]/AT; ETT in place, EOMI, Cortrak in place and secure Lungs: Bilateral chest excursion, Rhonchi with few crackles Cardiovascular:S1, S2,  Irreg but w/o m/r/g Abdomen: Supple, no guarding, +BS, ND, NT, Last BM 8/16 Extremities: No obvious  deformities, MAE x 4, brisk refill, some bruising noted Neuro: Follows simple commands, MAE x 4, Nods appropriately   Assessment & Plan:   Acute Respiratory Failure - s/p multiple failed extubations - Will  consult ENT for Trach - Aggressive Bronchial hygiene. - Continue nebs as ordered - Follow CXR intermittently. - CPAP/ PS trials as able  Acute pulmonary edema after 1u PRBC overnight. Received 1 Unit PRBC 8/15  Developed  overload after infusion Increase in creatinine 8/17 after one dose of lasix 40 - CXR 8/18 - Lasix as renal function allows>> no dose 8/17  AKI on CKD. Creatinine continues to rise Hyponatremia. - Supportive care. - Trend BMP - Avoid nephrotoxic medications  Normocytic, normochromic anemia - s/p 1u PRBC overnight 8/15. Continued drop in HGB overnight with no obvious source - Stool for Hemocult 8/17 - Monitor for bleeding - Transfuse for Hgb < 7. - Will stop Plavix and ASA ( Discussed with Dr. Oneida Alar who agrees)  Right CEA redo. - Per vascular. - Appreciate assist  Best practice:  Diet: TF. Will need to hold for trach Pain/Anxiety/Delirium protocol (if indicated): Fentanyl/Versed prn VAP protocol (if indicated): Yes DVT prophylaxis: SCD, heparin GI prophylaxis: PPI Glucose control: Lantus, SSI Mobility: BR Code Status: Full Family Communication: No family at bedside   Disposition: ICU   Critical care time: 79 min   Magdalen Spatz, MSN, AGACNP-BC [place] for personal pager PCCM on call pager [phone] 12/18/2019, 9:19 AM

## 2019-12-18 NOTE — Progress Notes (Signed)
I have spoken with Dr. Janace Hoard. He will see the patient today, but does not anticipate trach until patient condition is less tenuous ( regarding HGB , renal function) .  Plavix and ASA have been stopped as of 8/17.  We discussed that trach need to be done in the more controlled environment of the OR when stable to do so.   Magdalen Spatz, MSN, AGACNP-BC [place] for personal pager PCCM on call pager [phone]

## 2019-12-18 NOTE — Consult Note (Signed)
Reason for Consult:resp failure Referring Physician: CCm  Kelly Williams is an 79 y.o. male.  HPI: hx of stroke and carotid surgery. He now has redo carotid surgery. He has kidney failure and blood loss from a yet to be determined source. He just stopped plavix today. He is consulted for a trach because he is too sick to do at the bedside  Past Medical History:  Diagnosis Date  . Alcohol abuse   . Alcohol abuse   . Arthritis   . Carotid arterial disease ([place])   . Chronic kidney disease    CKD-patient denies  . COPD (chronic obstructive pulmonary disease) (HCC)    uses inhalers  . Coronary artery disease   . Diabetes mellitus without complication ([place])   . Diabetic retinopathy ([place])   . Hyperlipidemia   . Hypertension   . Normocytic anemia 01/16/2019  . Stroke [facility])    around 2020, 2 after bypass in 2015    Past Surgical History:  Procedure Laterality Date  . APPENDECTOMY     as teenager  . BACK SURGERY    . CAROTID ENDARTERECTOMY Right   . CORONARY ARTERY BYPASS GRAFT     10-12 years ago in [place] at [facility]  . ENDARTERECTOMY Right 12/20/2019   Procedure: EXPLORATION OF NECK, PLACEMENT OF 30f BLAKE DRAIN;  Surgeon: Angelia Mould, MD;  Location: [place];  Service: Vascular;  Laterality: Right;  . ENDARTERECTOMY Right 12/11/2019   Procedure: REDO RIGHT CAROTID ENDARTERECTOMY;  Surgeon: Elam Dutch, MD;  [facility] OR;  Service: Vascular;  Laterality: Right;  . IR PATIENT EVAL TECH 0-60 MINS  11/27/2019  . PATCH ANGIOPLASTY Right 12/29/2019   Procedure: PATCH ANGIOPLASTY USING HEMASHIELD PLATINUM FINESSE CARDIOVASCULAR PATCH;  Surgeon: Elam Dutch, MD;  Location[facility] OR;  Service: Vascular;  Laterality: Right;  . REVERSE SHOULDER ARTHROPLASTY Right 07/18/2019   Procedure: REVERSE SHOULDER ARTHROPLASTY WITH SUBSCAPULAR REPAIR;  Surgeon: Hiram Gash, MD;  Location: [facility] ORS;  Service: Orthopedics;  Laterality: Right;  . THROMBECTOMY BRACHIAL ARTERY Right  12/13/2019   Procedure: THROMBECTOMY CAROTID ARTERY;  Surgeon: Angelia Mould, MD;  Location: [place];  Service: Vascular;  Laterality: Right;  [name] VASECTOMY      Family History  Problem Relation Age of Onset  . Hypertension Mother   . Hypertension Father     Social History:  reports that he quit smoking about 17 years ago. His smoking use included cigarettes. He has a 10.00 pack-year smoking history. He has never used smokeless tobacco. He reports previous alcohol use. He reports that he does not use drugs.  Allergies:  Allergies  Allergen Reactions  . Metformin And Related Diarrhea    Medications: I have reviewed the patient's current medications.  Results for orders placed or performed during the hospital encounter of 12/18/2019 (from the past 48 hour(s))  Glucose, capillary     Status: Abnormal   Collection Time: 12/16/19 12:19 PM  Result Value Ref Range   Glucose-Capillary 172 (H) 70 - 99 mg/dL    Comment: Glucose reference range applies only to samples taken after fasting for at least 8 hours.  Glucose, capillary     Status: Abnormal   Collection Time: 12/16/19  4:13 PM  Result Value Ref Range   Glucose-Capillary 320 (H) 70 - 99 mg/dL    Comment: Glucose reference range applies only to samples taken after fasting for at least 8 hours.  Glucose, capillary     Status: Abnormal  Collection Time: 12/16/19  7:49 PM  Result Value Ref Range   Glucose-Capillary 231 (H) 70 - 99 mg/dL    Comment: Glucose reference range applies only to samples taken after fasting for at least 8 hours.  Glucose, capillary     Status: Abnormal   Collection Time: 12/16/19 11:30 PM  Result Value Ref Range   Glucose-Capillary 154 (H) 70 - 99 mg/dL    Comment: Glucose reference range applies only to samples taken after fasting for at least 8 hours.  CBC     Status: Abnormal   Collection Time: 12/17/19  2:32 AM  Result Value Ref Range   WBC 11.9 (H) 4.0 - 10.5 K/uL   RBC 1.93 (L) 4.22 - 5.81  MIL/uL   Hemoglobin 6.0 (LL) 13.0 - 17.0 g/dL    Comment: REPEATED TO VERIFY THIS CRITICAL RESULT HAS VERIFIED AND BEEN CALLED TO RN Venancio Poisson G. BY MESSAN HOUEGNIFIO ON 08 16 2021 AT 0342, AND HAS BEEN READ BACK.     HCT 18.4 (L) 39 - 52 %   MCV 95.3 80.0 - 100.0 fL   MCH 31.1 26.0 - 34.0 pg   MCHC 32.6 30.0 - 36.0 g/dL   RDW 13.6 11.5 - 15.5 %   Platelets 472 (H) 150 - 400 K/uL   nRBC 0.0 0.0 - 0.2 %    Comment: Performed at [facility] [address], [place], [place] [address]  Basic metabolic panel     Status: Abnormal   Collection Time: 12/17/19  2:32 AM  Result Value Ref Range   Sodium 128 (L) 135 - 145 mmol/L   Potassium 4.6 3.5 - 5.1 mmol/L   Chloride 90 (L) 98 - 111 mmol/L   CO2 25 22 - 32 mmol/L   Glucose, Bld 131 (H) 70 - 99 mg/dL    Comment: Glucose reference range applies only to samples taken after fasting for at least 8 hours.   BUN 71 (H) 8 - 23 mg/dL   Creatinine, Ser 2.10 (H) 0.61 - 1.24 mg/dL   Calcium 7.3 (L) 8.9 - 10.3 mg/dL   GFR calc non Af Amer 29 (L) >60 mL/min   GFR calc Af Amer 34 (L) >60 mL/min   Anion gap 13 5 - 15    Comment: Performed at [facility] [address], [place], [place] [address]  Glucose, capillary     Status: Abnormal   Collection Time: 12/17/19  3:28 AM  Result Value Ref Range   Glucose-Capillary 108 (H) 70 - 99 mg/dL    Comment: Glucose reference range applies only to samples taken after fasting for at least 8 hours.  Type and screen [place]     Status: None   Collection Time: 12/17/19  4:45 AM  Result Value Ref Range   ABO/RH(D) A POS    Antibody Screen NEG    Sample Expiration 12/20/2019,2359    Unit Number [id number]    Blood Component Type RED CELLS,LR    Unit division 00    Status of Unit ISSUED,FINAL    Transfusion Status OK TO TRANSFUSE    Crossmatch Result      Compatible Performed at [facility], [place] [address] [place], [place] [address]   Prepare RBC (crossmatch)      Status: None   Collection Time: 12/17/19  5:45 AM  Result Value Ref Range   Order Confirmation      ORDER PROCESSED BY BLOOD BANK Performed  at [facility], [place] [address], [place], [place] [address]   Glucose, capillary     Status: None   Collection Time: 12/17/19  7:23 AM  Result Value Ref Range   Glucose-Capillary 71 70 - 99 mg/dL    Comment: Glucose reference range applies only to samples taken after fasting for at least 8 hours.  Surgical PCR screen     Status: None   Collection Time: 12/17/19  9:29 AM   Specimen: Nasal Mucosa; Nasal Swab  Result Value Ref Range   MRSA, PCR NEGATIVE NEGATIVE   Staphylococcus aureus NEGATIVE NEGATIVE    Comment: (NOTE) The Xpert SA Assay (FDA approved for NASAL specimens in patients 4 years of age and older), is one component of a comprehensive surveillance program. It is not intended to diagnose infection nor to guide or monitor treatment. Performed at [facility], [place] [address], [place], [place] [address]   Glucose, capillary     Status: Abnormal   Collection Time: 12/17/19 11:52 AM  Result Value Ref Range   Glucose-Capillary 68 (L) 70 - 99 mg/dL    Comment: Glucose reference range applies only to samples taken after fasting for at least 8 hours.  CBC     Status: Abnormal   Collection Time: 12/17/19 12:17 PM  Result Value Ref Range   WBC 14.8 (H) 4.0 - 10.5 K/uL   RBC 3.12 (L) 4.22 - 5.81 MIL/uL   Hemoglobin 9.7 (L) 13.0 - 17.0 g/dL    Comment: REPEATED TO VERIFY POST TRANSFUSION SPECIMEN    HCT 28.9 (L) 39 - 52 %   MCV 92.6 80.0 - 100.0 fL   MCH 31.1 26.0 - 34.0 pg   MCHC 33.6 30.0 - 36.0 g/dL   RDW 14.0 11.5 - 15.5 %   Platelets 452 (H) 150 - 400 K/uL   nRBC 0.1 0.0 - 0.2 %    Comment: Performed at [facility] [address] [place], [place] [address]  Glucose, capillary     Status: Abnormal   Collection Time: 12/17/19 12:37 PM  Result Value Ref Range   Glucose-Capillary 106 (H) 70 - 99 mg/dL     Comment: Glucose reference range applies only to samples taken after fasting for at least 8 hours.  Glucose, capillary     Status: None   Collection Time: 12/17/19  5:14 PM  Result Value Ref Range   Glucose-Capillary 94 70 - 99 mg/dL    Comment: Glucose reference range applies only to samples taken after fasting for at least 8 hours.  Protime-INR     Status: None   Collection Time: 12/17/19  6:07 PM  Result Value Ref Range   Prothrombin Time 14.7 11.4 - 15.2 seconds   INR 1.2 0.8 - 1.2    Comment: (NOTE) INR goal varies based on device and disease states. Performed at [facility], [place] [address], [place], [place] [address]   APTT     Status: Abnormal   Collection Time: 12/17/19  6:07 PM  Result Value Ref Range   aPTT 41 (H) 24 - 36 seconds    Comment:        IF BASELINE aPTT IS ELEVATED, SUGGEST PATIENT RISK ASSESSMENT BE USED TO DETERMINE APPROPRIATE ANTICOAGULANT THERAPY. Performed at [facility], [place] [address], [place], [place] [address]   Glucose, capillary     Status: Abnormal   Collection Time: 12/17/19  7:42 PM  Result Value Ref Range  Glucose-Capillary 176 (H) 70 - 99 mg/dL    Comment: Glucose reference range applies only to samples taken after fasting for at least 8 hours.  Glucose, capillary     Status: Abnormal   Collection Time: 12/17/19 11:18 PM  Result Value Ref Range   Glucose-Capillary 241 (H) 70 - 99 mg/dL    Comment: Glucose reference range applies only to samples taken after fasting for at least 8 hours.  Glucose, capillary     Status: Abnormal   Collection Time: 12/18/19  3:26 AM  Result Value Ref Range   Glucose-Capillary 183 (H) 70 - 99 mg/dL    Comment: Glucose reference range applies only to samples taken after fasting for at least 8 hours.  CBC     Status: Abnormal   Collection Time: 12/18/19  6:01 AM  Result Value Ref Range   WBC 10.8 (H) 4.0 - 10.5 K/uL   RBC 2.61 (L) 4.22 - 5.81 MIL/uL   Hemoglobin 7.8 (L) 13.0 - 17.0 g/dL    HCT 24.1 (L) 39 - 52 %   MCV 92.3 80.0 - 100.0 fL   MCH 29.9 26.0 - 34.0 pg   MCHC 32.4 30.0 - 36.0 g/dL   RDW 14.2 11.5 - 15.5 %   Platelets 461 (H) 150 - 400 K/uL   nRBC 0.0 0.0 - 0.2 %    Comment: Performed at [facility] [address], [place], [place] [address]  Basic metabolic panel     Status: Abnormal   Collection Time: 12/18/19  6:01 AM  Result Value Ref Range   Sodium 131 (L) 135 - 145 mmol/L   Potassium 3.8 3.5 - 5.1 mmol/L   Chloride 95 (L) 98 - 111 mmol/L   CO2 24 22 - 32 mmol/L   Glucose, Bld 147 (H) 70 - 99 mg/dL    Comment: Glucose reference range applies only to samples taken after fasting for at least 8 hours.   BUN 75 (H) 8 - 23 mg/dL   Creatinine, Ser 2.23 (H) 0.61 - 1.24 mg/dL   Calcium 7.5 (L) 8.9 - 10.3 mg/dL   GFR calc non Af Amer 27 (L) >60 mL/min   GFR calc Af Amer 31 (L) >60 mL/min   Anion gap 12 5 - 15    Comment: Performed at [facility] [address], [place], [place] [address]  Glucose, capillary     Status: Abnormal   Collection Time: 12/18/19  7:44 AM  Result Value Ref Range   Glucose-Capillary 146 (H) 70 - 99 mg/dL    Comment: Glucose reference range applies only to samples taken after fasting for at least 8 hours.    No results found.  Review of Systems Blood pressure (!) 101/53, pulse 72, temperature 98.3 F (36.8 C), temperature source Axillary, resp. rate 19, height 5\' 6"  (1.676 m), weight 62.2 kg, SpO2 100 %. Physical Exam HENT:     Head:     Comments: He is sedated and intubated. The NGT is in nose and ETT in mouth. The neck is thin and there is healing carotid wound on the right. No swelling. No lines in neck.     Assessment/Plan: Respiratory failure- the patient still has medical problems that would be best stabilized and sourced before a tracheotomy. He just stopped Plavix so will need to wait two days for that in an elective situation as this. I will check him out to the next ENT call doc to proceed with trach  after Thursday or Friday.   Melissa Montane 12/18/2019, 11:00 AM

## 2019-12-18 NOTE — Progress Notes (Addendum)
Occupational Therapy Treatment Patient Details Name: Kelly Williams MRN: [id number] DOB: 1940/10/27 Today's Date: 12/18/2019    History of present illness Pt is a 79 y/o male with PMH of stroke, HTN, DM, periphreal neuropathy, carotid disease with L carotid endarterectomy, CABG x 5, R shoulder arthroplasthy, medication noncompliance and alcohol abuse. Daughter found patient after falling and intoxicated, L facial droop, slurring speech and aggression (reports hx of mulitple falls). MRI reveals acute ischemic infarcts of R frontal and parietal lobes. MRA of neck revealed focal stenosis in the right internal carotid artery approximately 3 cm distal to the bifurcation with an adjacent 11 mm aneurysmal dilatation. Pt underwent R carotid endarterectomy on 8/3. Pt with change in neuro status after procedure and was taken back to OR for exploration with no significant findings. Pt required intubation on 8/5 due to desaturation, extubated 12/08/19.  Reintubated on 8/11 due to hypoxia and transferred to ICU   OT comments  Pt seen on 40 % FiO2; Peep 5 PRVC; ET tube. BP supine 101/56; sitting in Egress 96/56; after activity 116/71; HR 77. Egress position used to work on Hotel manager and endurance. Pt following commands - alert, demonstrating excellent participation. Completed @ 15 minutes of activity in seated position - using rails to pull into unsupported seated position at times. Music (Big band) used during session which pt appeared to enjoy. Will continue to follow acutely to maximize functional level of independence and facilitate DC to post acute rehab.   Follow Up Recommendations  SNF;Supervision/Assistance - 24 hour    Equipment Recommendations  Other (comment) (TBA)    Recommendations for Other Services      Precautions / Restrictions Precautions Precautions: Fall Precaution Comments: ET; has self extubated       Mobility Bed Mobility               General bed mobility comments: Used  Egress to work on trunk adn BUE strengthening ; pulling on side rails to move into unsupported sitting; lateral weight shifts; able to help scoot forward toward EOB - good problem solving to complete this  Transfers                 General transfer comment: not attempted this date    Balance     Sitting balance-Leahy Scale: Poor                                     ADL either performed or assessed with clinical judgement   ADL Overall ADL's : Needs assistance/impaired                                     Functional mobility during ADLs: +2 for physical assistance;Moderate assistance (min a to pull forward in Egress; increased assistance as pt ) General ADL Comments: Limited due to riskof pt self extubating - di not attempt washing face     Vision       Perception     Praxis      Cognition Arousal/Alertness: Awake/alert Behavior During Therapy:  (appears at baeline) Overall Cognitive Status: Difficult to assess                                 General Comments: Pt alert and following commands; slow processing  but good participation; able to indicate that he was fatigued after sitting upright        Exercises Exercises: General Upper Extremity General Exercises - Upper Extremity Shoulder Flexion: AAROM;Both;10 reps;Seated (to 60) Elbow Flexion: AAROM;AROM;Both;10 reps;Seated Elbow Extension: AAROM;AROM;Both;10 reps;Seated Digit Composite Flexion: AROM;Both;Strengthening;Seated;10 reps   Shoulder Instructions       General Comments      Pertinent Vitals/ Pain       Pain Assessment: Faces Faces Pain Scale: Hurts little more Pain Location: discomfort Pain Descriptors / Indicators: Discomfort;Guarding Pain Intervention(s): Limited activity within patient's tolerance  Home Living                                          Prior Functioning/Environment              Frequency  Min 2X/week         Progress Toward Goals  OT Goals(current goals can now be found in the care plan section)  Progress towards OT goals:  (goals assessed/re-established)  Acute Rehab OT Goals Patient Stated Goal: Pt unable to state, family goal to get stronger and improve cough OT Goal Formulation: With patient (nodding his head yes to get better) Time For Goal Achievement: 01/01/20 Potential to Achieve Goals: Good  Plan Discharge plan needs to be updated    Co-evaluation                 AM-PAC OT "6 Clicks" Daily Activity     Outcome Measure   Help from another person eating meals?: Total Help from another person taking care of personal grooming?: A Lot Help from another person toileting, which includes using toliet, bedpan, or urinal?: Total Help from another person bathing (including washing, rinsing, drying)?: A Lot Help from another person to put on and taking off regular upper body clothing?: Total Help from another person to put on and taking off regular lower body clothing?: Total 6 Click Score: 8    End of Session Equipment Utilized During Treatment:  (40% FiO2 peep 5)  OT Visit Diagnosis: Unsteadiness on feet (R26.81);Other abnormalities of gait and mobility (R26.89);Muscle weakness (generalized) (M62.81);Other symptoms and signs involving cognitive function;Pain Symptoms and signs involving cognitive functions: Other cerebrovascular disease Pain - part of body:  (generalized)   Activity Tolerance Patient tolerated treatment well   Patient Left in chair;with call bell/phone within reach;with bed alarm set   Nurse Communication Mobility status        Time: 1941-7408 OT Time Calculation (min): 22 min  Charges: OT General Charges $OT Visit: 1 Visit OT Treatments $Therapeutic Activity: 8-22 mins  Maurie Boettcher, OT/L   Acute OT Clinical Specialist [place] Pager [phone] Office [phone]    [facility] 12/18/2019, 4:46  PM

## 2019-12-18 NOTE — Progress Notes (Addendum)
  Progress Note    12/18/2019 7:47 AM 14 Days Post-Op  Subjective:  Intubated and sedated   Vitals:   12/18/19 0500 12/18/19 0600  BP: 97/62 (!) 102/58  Pulse: (!) 54 (!) 59  Resp: 19 17  Temp:    SpO2: 100% 98%   Physical Exam: Lungs:  Mechanical ventilation Incisions:  R neck incision healing well Neurologic: sedated  CBC    Component Value Date/Time   WBC 10.8 (H) 12/18/2019 0601   RBC 2.61 (L) 12/18/2019 0601   HGB 7.8 (L) 12/18/2019 0601   HCT 24.1 (L) 12/18/2019 0601   PLT 461 (H) 12/18/2019 0601   MCV 92.3 12/18/2019 0601   MCH 29.9 12/18/2019 0601   MCHC 32.4 12/18/2019 0601   RDW 14.2 12/18/2019 0601   LYMPHSABS 3.4 11/26/2019 0004   MONOABS 0.9 11/26/2019 0004   EOSABS 0.3 11/26/2019 0004   BASOSABS 0.1 11/26/2019 0004    BMET    Component Value Date/Time   NA 128 (L) 12/17/2019 0232   K 4.6 12/17/2019 0232   CL 90 (L) 12/17/2019 0232   CO2 25 12/17/2019 0232   GLUCOSE 131 (H) 12/17/2019 0232   BUN 71 (H) 12/17/2019 0232   CREATININE 2.10 (H) 12/17/2019 0232   CREATININE 1.34 (H) 08/24/2019 1114   CALCIUM 7.3 (L) 12/17/2019 0232   GFRNONAA 29 (L) 12/17/2019 0232   GFRNONAA 50 (L) 08/24/2019 1114   GFRAA 34 (L) 12/17/2019 0232   GFRAA 58 (L) 08/24/2019 1114    INR    Component Value Date/Time   INR 1.2 12/17/2019 1807     Intake/Output Summary (Last 24 hours) at 12/18/2019 0747 Last data filed at 12/18/2019 0600 Gross per 24 hour  Intake 1039.96 ml  Output 2000 ml  Net -960.04 ml     Assessment/Plan:  79 y.o. male is s/p redo R CEA 14 Days Post-Op   Hgb continues to drift; monitor daily CBC AKI: BMP pending; 600cc urine overnight Ok to hold plavix for trach placement   [name], PA-C [facility] [phone] 12/18/2019 7:47 AM  Agree with above. Ok to hold plavix and ASA for trach  Creatinine rising baseline around 1.8, hyponatremia, I suspect this is prerenal may need to give some fluid back  will defer to CCM  Neuro at baseline POD 14  Acute and chronic blood loss anemia will defer transfusion to CCM but with oxygenation requirements would probably benefit  Ruta Hinds, MD [facility] [place]: [phone]

## 2019-12-19 ENCOUNTER — Inpatient Hospital Stay (HOSPITAL_COMMUNITY): Payer: Medicare HMO

## 2019-12-19 LAB — COMPREHENSIVE METABOLIC PANEL
ALT: 36 U/L (ref 0–44)
AST: 65 U/L — ABNORMAL HIGH (ref 15–41)
Albumin: 1.8 g/dL — ABNORMAL LOW (ref 3.5–5.0)
Alkaline Phosphatase: 286 U/L — ABNORMAL HIGH (ref 38–126)
Anion gap: 13 (ref 5–15)
BUN: 87 mg/dL — ABNORMAL HIGH (ref 8–23)
CO2: 23 mmol/L (ref 22–32)
Calcium: 7.5 mg/dL — ABNORMAL LOW (ref 8.9–10.3)
Chloride: 94 mmol/L — ABNORMAL LOW (ref 98–111)
Creatinine, Ser: 2.24 mg/dL — ABNORMAL HIGH (ref 0.61–1.24)
GFR calc Af Amer: 31 mL/min — ABNORMAL LOW (ref >60)
GFR calc non Af Amer: 27 mL/min — ABNORMAL LOW (ref >60)
Glucose, Bld: 196 mg/dL — ABNORMAL HIGH (ref 70–99)
Potassium: 4.5 mmol/L (ref 3.5–5.1)
Sodium: 130 mmol/L — ABNORMAL LOW (ref 135–145)
Total Bilirubin: 0.8 mg/dL (ref 0.3–1.2)
Total Protein: 6.4 g/dL — ABNORMAL LOW (ref 6.5–8.1)

## 2019-12-19 LAB — URINALYSIS, ROUTINE W REFLEX MICROSCOPIC
Bilirubin Urine: NEGATIVE
Glucose, UA: NEGATIVE mg/dL
Ketones, ur: NEGATIVE mg/dL
Nitrite: NEGATIVE
Protein, ur: 30 mg/dL — AB
Specific Gravity, Urine: 1.025 (ref 1.005–1.030)
pH: 5.5 (ref 5.0–8.0)

## 2019-12-19 LAB — BASIC METABOLIC PANEL
Anion gap: 12 (ref 5–15)
BUN: 88 mg/dL — ABNORMAL HIGH (ref 8–23)
CO2: 26 mmol/L (ref 22–32)
Calcium: 7.8 mg/dL — ABNORMAL LOW (ref 8.9–10.3)
Chloride: 95 mmol/L — ABNORMAL LOW (ref 98–111)
Creatinine, Ser: 2.19 mg/dL — ABNORMAL HIGH (ref 0.61–1.24)
GFR calc Af Amer: 32 mL/min — ABNORMAL LOW (ref >60)
GFR calc non Af Amer: 28 mL/min — ABNORMAL LOW (ref >60)
Glucose, Bld: 135 mg/dL — ABNORMAL HIGH (ref 70–99)
Potassium: 4.6 mmol/L (ref 3.5–5.1)
Sodium: 133 mmol/L — ABNORMAL LOW (ref 135–145)

## 2019-12-19 LAB — CBC
HCT: 26.8 % — ABNORMAL LOW (ref 39.0–52.0)
Hemoglobin: 8.5 g/dL — ABNORMAL LOW (ref 13.0–17.0)
MCH: 29.9 pg (ref 26.0–34.0)
MCHC: 31.7 g/dL (ref 30.0–36.0)
MCV: 94.4 fL (ref 80.0–100.0)
Platelets: 529 10*3/uL — ABNORMAL HIGH (ref 150–400)
RBC: 2.84 MIL/uL — ABNORMAL LOW (ref 4.22–5.81)
RDW: 14.3 % (ref 11.5–15.5)
WBC: 13.5 10*3/uL — ABNORMAL HIGH (ref 4.0–10.5)
nRBC: 0 % (ref 0.0–0.2)

## 2019-12-19 LAB — GLUCOSE, CAPILLARY
Glucose-Capillary: 100 mg/dL — ABNORMAL HIGH (ref 70–99)
Glucose-Capillary: 123 mg/dL — ABNORMAL HIGH (ref 70–99)
Glucose-Capillary: 148 mg/dL — ABNORMAL HIGH (ref 70–99)
Glucose-Capillary: 192 mg/dL — ABNORMAL HIGH (ref 70–99)
Glucose-Capillary: 218 mg/dL — ABNORMAL HIGH (ref 70–99)
Glucose-Capillary: 99 mg/dL (ref 70–99)

## 2019-12-19 LAB — CREATININE, URINE, RANDOM: Creatinine, Urine: 88.74 mg/dL

## 2019-12-19 LAB — URINALYSIS, MICROSCOPIC (REFLEX)

## 2019-12-19 LAB — SODIUM, URINE, RANDOM: Sodium, Ur: <10 mmol/L

## 2019-12-19 LAB — MAGNESIUM: Magnesium: 3 mg/dL — ABNORMAL HIGH (ref 1.7–2.4)

## 2019-12-19 MED ORDER — PROSOURCE TF PO LIQD
45.0000 mL | Freq: Three times a day (TID) | ORAL | Status: DC
  Administered 2019-12-19 – 2020-01-04 (×47): 45 mL
  Filled 2019-12-19 (×46): qty 45

## 2019-12-19 MED ORDER — FUROSEMIDE 10 MG/ML IJ SOLN
40.0000 mg | Freq: Two times a day (BID) | INTRAMUSCULAR | Status: DC
  Administered 2019-12-19 – 2019-12-20 (×2): 40 mg via INTRAVENOUS
  Filled 2019-12-19 (×2): qty 4

## 2019-12-19 MED ORDER — ALBUMIN HUMAN 25 % IV SOLN
25.0000 g | Freq: Four times a day (QID) | INTRAVENOUS | Status: DC
  Administered 2019-12-19 – 2019-12-21 (×10): 25 g via INTRAVENOUS
  Filled 2019-12-19 (×10): qty 100

## 2019-12-19 MED ORDER — JEVITY 1.2 CAL PO LIQD
1000.0000 mL | ORAL | Status: DC
  Administered 2019-12-19 – 2019-12-26 (×8): 1000 mL
  Filled 2019-12-19 (×10): qty 1000

## 2019-12-19 MED ORDER — FUROSEMIDE 10 MG/ML IJ SOLN
40.0000 mg | Freq: Once | INTRAMUSCULAR | Status: AC
  Administered 2019-12-19: 40 mg via INTRAVENOUS
  Filled 2019-12-19: qty 4

## 2019-12-19 NOTE — Progress Notes (Signed)
NAME:  Kelly Williams, MRN:  [id number], DOB:  [birth year], LOS: 3 ADMISSION DATE:  12/31/2019, CONSULTATION DATE:  12/12/2019 REFERRING MD:  Oneida Alar, CHIEF COMPLAINT:  Vent management for acute respiratory failure   Brief History   79 year old male with history of recurrent R ICA occlusion post-CEA who developed respoiratory failure requiring reintubation and pneumonia. He initially failed self extubation 8/5 after 24 hours off vent, and was again extubated 8/7 with seemingly good toleration. Then in the early am hours of 8/11, several hours after self-removal of Cortrak, he became unresponsive and hypoxemic requiring reintubation and transfer to ICU.  Patient is awaiting trach placement.  Past Medical History   Past Medical History:  Diagnosis Date  . Alcohol abuse   . Alcohol abuse   . Arthritis   . Carotid arterial disease ([place])   . Chronic kidney disease    CKD-patient denies  . COPD (chronic obstructive pulmonary disease) (HCC)    uses inhalers  . Coronary artery disease   . Diabetes mellitus without complication ([place])   . Diabetic retinopathy ([place])   . Hyperlipidemia   . Hypertension   . Normocytic anemia 01/16/2019  . Stroke [facility])    around 2020, 2 after bypass in 2015   Significant Hospital Events   8/3: admitted to [facility], CEA, PCCM consulted for vent management 8/4: self extubated 8/5: Re-intubated overnight 8/7: Extubated 8/11: Reintubation   Consults:  Neurology   Procedures:  8/3: ETT 8/3: R CEA 8/3: Exploration of R CEA and R ICA thrombectomy 8/3: R art line   Significant Diagnostic Tests:  Carotid duplex 8/3 > right ICA occlusion CTA Head/Neck 8/3 > No acute intracranial abnormality. Post repeat right carotid endarterectomy. Decreased irregularity of the proximal cervical ICA. 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 Persistent narrowing and irregularity of the distal ICA with increased eccentric intraluminal  filling defect, which may reflect unstable/migrated clot. 60-70% stenosis of the proximal left ICA. No proximal intracranial vessel occlusion. Stable findings of multifocal intracranial atherosclerosis. MRI Brain wo Contrast 8/3 > 1. Unchanged appearance of acute right MCA territory infarct. No new site of acute ischemia. 2. Unchanged generalized atrophy and findings of chronic ischemic Microangiopathy. MR Brain wo Contrast 8/6 > Unchanged appearance of a subacute right MCA territory infarct centered within the right frontal operculum as compared to the MRI of 12/03/2019. No significant mass effect or midline shift. Expected evolution of a known subacute infarct within the right parietal lobe. No interval acute infarct is demonstrated. Stable background generalized parenchymal atrophy and chronic ischemic changes with multiple chronic infarcts as detailed. CT Head wo Contrast 8/11 > 1. Stable appearance of subacute and chronic right MCA infarcts. 2. Stable remote infarcts of the high right frontal lobe and left basal ganglia. 3. No acute intracranial abnormality or significant interval change.  Micro Data:  MRSA 8/3 Negative Respiratory Cx 8/5 > Negative  Blood Cx 8/11 > Negative  Respiratory Cx 8/13 > pansensitive pseudomonas MRSA 8/16 Negative  Antimicrobials:  Cefazolin 8/3 Unasyn 8/5 > 8/12 Zosyn 8/12> 8/16 Ceftazidime 8/16>   Interim history/subjective:  No acute events overnight. Remains mechanically ventilated.   Objective   Blood pressure 138/62, pulse 73, temperature 97.7 F (36.5 C), temperature source Axillary, resp. rate 13, height 5\' 6"  (1.676 m), weight 76.9 kg, SpO2 100 %.    Vent Mode: PRVC FiO2 (%):  [40 %] 40 % Set Rate:  [18 bmp] 18 bmp Vt Set:  [  510 mL] 510 mL PEEP:  [5 cmH20] 5 cmH20 Plateau Pressure:  [16 cmH20-23 cmH20] 23 cmH20   Intake/Output Summary (Last 24 hours) at 12/19/2019 6160 Last data filed at 12/19/2019 0700 Gross per 24 hour  Intake 2269.41  ml  Output 610 ml  Net 1659.41 ml   Filed Weights   12/13/19 0500 12/14/19 0500 12/19/19 0500  Weight: 62 kg 62.2 kg 76.9 kg   Examination: General: critically ill appearing elderly male, no acute distress, awake and alert tolerating PS  HENT: [place]/AT, EOMI, PERRL, anicteric sclerae, MMM, ETT in place Lungs: diffuse rhonchi with diminished bibasilar lung sounds  Cardiovascular: RRR, S1 and S2 present, no m/r/g, distal pulses intact Abdomen: nondistended, soft, nontender, + bowel sounds  Extremities: warm and dry Neuro: awake and alert; appropriately responding to questions and moving all extremities spontaneously  POC [place] at bedside: No pericardial effusion noted; bilateral ventricles appropriately contracting.  Moderate sized bilateral pleural effusions (simple). Curley B lines noted.  Assessment & Plan:  Acute Respiratory failure s/p multiple failed extubations  - ENT consulted for trach; recommend medical stabilization prior to trach and as Plavix discontinued on 8/17, will likely proceed with trach after Thursday or Friday - Aggressive bronchial hygiene - Continue SBT trials as tolerated  Bilateral pleural effusions and pulmonary edema:  Received one dose of Lasix 40mg  on 8/16 after which noted to have increased creatinine. Most recent CXR and POCUS at bedside with moderate sized bilateral pleural effusions. Curly B lines also seen on POCUS suggestive of pulmonary congestion, likely in setting of hypervolemia.  - IV Lasix 40mg  bid   AKI on CKD:  Hyponatremia Urine studies suggestive of pre-renal etiology. Patient likely third spacing for which will give albumin; followed by lasix as above.  - Albumin 25g q6h and Lasix 40mg  bid  - Strict I&O - BMP in afternoon to monitor renal function - Renally adjust all medications and avoid nephrotoxic agents as able   Normocytic anemia:  Received 1u pRBC on 8/15 for acute drop in Hb. FOBT was positive and is noted to have hematuria in  urine. Was receiving DAPT which was discontinued yesterday. Hb is stable this morning.  - Continue to monitor. Transfuse for Hb<7   R CEA revision:  - Per vascular surgery Best practice:  Diet: tube feeds  Pain/Anxiety/Delirium protocol (if indicated): per protocol VAP protocol (if indicated): per protocol  DVT prophylaxis: SCDs GI prophylaxis: PPI Glucose control: Lantus + SSI Mobility: Bed rest Code Status: FULL Family Communication: will update daughter  Disposition: ICU  Critical care time: 35 minutes     Harvie Heck, MD Internal Medicine, PGY-2 12/19/19 11:40 AM Pager # [phone]

## 2019-12-19 NOTE — Progress Notes (Signed)
Nutrition Follow-up  DOCUMENTATION CODES:   Severe malnutrition in context of chronic illness  INTERVENTION:  Tube feeding via Cortrak: Jevity 1.2 at 50 ml/h (1200 ml per day) Prosource TF 45 ml TID  Provides 1560 kcal, 99 gm protein, 968 ml free water daily   NUTRITION DIAGNOSIS:   Severe Malnutrition related to chronic illness (COPD, DM, ETOH abuse) as evidenced by severe fat depletion, severe muscle depletion.  Ongoing  GOAL:   Patient will meet greater than or equal to 90% of their needs  Met with TF  MONITOR:   Diet advancement, TF tolerance  REASON FOR ASSESSMENT:   Consult, Ventilator Enteral/tube feeding initiation and management  ASSESSMENT:   Pt with PMH of DM, HTN, ETOH abuse, COPD, HLD, and CKD admitted for planned redo R carotid endarterectomy then after neuro change s/p stat exploration of R CEA and R ICA thrombectomy with no intervention. Hospital course complicated by acute respiratory failure 2/2 aspiration PNA.   8/3 admit 8/4 self-extubated 8/5 re-intubated 8/6 cortrak placed  8/7 extubated 8/10 pt removed cortrak 8/11 re-intubated; cortrak replaced (gastric)  Pt will have trach once condition is less tenuous.    Per RN, pt with possible new pleural effusion.  Pt remains on vent and is receiving TF via Cortrak. Current TF orders: Jevity 1.2 cal @ 75m/hr, 447mProsource TF BID  Patient is currently intubated on ventilator support MV: 9.8 L/min Temp (24hrs), Avg:98.1 F (36.7 C), Min:97.5 F (36.4 C), Max:98.6 F (37 C)  Labs: Na 130 (L), Mg 3.0 (H),CBGs 104-192 Medications: Novolog, Lantus, MVI, Protonix Drips: Precedex, Fentanyl  Diet Order:   Diet Order            Diet NPO time specified  Diet effective midnight                 EDUCATION NEEDS:   No education needs have been identified at this time  Skin:  Skin Assessment: Skin Integrity Issues: Skin Integrity Issues:: Incisions Incisions: R neck  Last BM:   8/17  Height:   Ht Readings from Last 1 Encounters:  12/03/2019 _0  (1.676 m)    Weight:   Wt Readings from Last 1 Encounters:  12/19/19 76.9 kg    Ideal Body Weight:  64.5 kg  BMI:  Body mass index is 27.36 kg/m.  Estimated Nutritional Needs:   Kcal:  1506  Protein:  95-115 grams  Fluid:  > 1.8 L/day    AmLarkin InaMS, RD, LDN RD pager number and weekend/on-call pager number located in [phone]

## 2019-12-19 NOTE — Progress Notes (Signed)
[facility] [place]  Subjective  - stable on vent answering questions following commands   Objective (!) 117/103 83 97.7 F (36.5 C) (Axillary) 16 100%  Intake/Output Summary (Last 24 hours) at 12/19/2019 0640 Last data filed at 12/19/2019 0600 Gross per 24 hour  Intake 2156.66 ml  Output 610 ml  Net 1546.66 ml   Neuro UE LE 5/5 motor Right neck incision healed  Assessment/Planning: POD #15 s/p redo CEA complicated by cranial nerve neuropraxia post op with swallowing difficulties COPD deconditioning  Minimal vent settings awaiting trach  Ongoing anemia unknown source chronic disease malnutrition vs ongoing blood loss?  Renal failure slowly worsening creatinine, urine output has been marginal, several indicators he is hypovolemic but will defer to critical care service volume status. BUN is in the 70s  Consider renal consult  Ruta Hinds 12/19/2019 6:40 AM --  Laboratory Lab Results: Recent Labs    12/17/19 1217 12/18/19 0601  WBC 14.8* 10.8*  HGB 9.7* 7.8*  HCT 28.9* 24.1*  PLT 452* 461*   BMET Recent Labs    12/17/19 0232 12/18/19 0601  NA 128* 131*  K 4.6 3.8  CL 90* 95*  CO2 25 24  GLUCOSE 131* 147*  BUN 71* 75*  CREATININE 2.10* 2.23*  CALCIUM 7.3* 7.5*    COAG Lab Results  Component Value Date   INR 1.2 12/17/2019   INR 1.0 12/06/2019   INR 1.0 11/26/2019   No results found for: PTT

## 2019-12-19 NOTE — Progress Notes (Signed)
PT Cancellation Note  Patient Details Name: Kelly Williams MRN: [id number] DOB: 1940/05/16   Cancelled Treatment:    Reason Eval/Treat Not Completed: Medical issues which prohibited therapy. RN reporting patient with possible new pleural effusion and with drop in Hgb, requesting PT hold other than PROM exercise. Pt mobilizes all extremities well at this time, no imminent need for PROM currently. PT will follow up when pt is more appropriate for functional mobility.   Zenaida Niece 12/19/2019, 10:06 AM

## 2019-12-19 NOTE — Progress Notes (Signed)
SLP Cancellation Note  Patient Details Name: Kelly Williams MRN: [id number] DOB: Apr 14, 1941   Cancelled treatment:       Reason Eval/Treat Not Completed: Medical issues which prohibited therapy. Pt too medically tenuous for trach, remains intubated. Will plan to f/u next week, but can be available before then if needed.    Osie Bond., M.A. [place] [facility] Pager [phone] Office [phone]  12/19/2019, 8:28 AM

## 2019-12-20 ENCOUNTER — Inpatient Hospital Stay (HOSPITAL_COMMUNITY): Payer: Medicare HMO

## 2019-12-20 LAB — BASIC METABOLIC PANEL
Anion gap: 13 (ref 5–15)
BUN: 98 mg/dL — ABNORMAL HIGH (ref 8–23)
CO2: 25 mmol/L (ref 22–32)
Calcium: 8.3 mg/dL — ABNORMAL LOW (ref 8.9–10.3)
Chloride: 95 mmol/L — ABNORMAL LOW (ref 98–111)
Creatinine, Ser: 2.29 mg/dL — ABNORMAL HIGH (ref 0.61–1.24)
GFR calc Af Amer: 30 mL/min — ABNORMAL LOW (ref >60)
GFR calc non Af Amer: 26 mL/min — ABNORMAL LOW (ref >60)
Glucose, Bld: 118 mg/dL — ABNORMAL HIGH (ref 70–99)
Potassium: 5 mmol/L (ref 3.5–5.1)
Sodium: 133 mmol/L — ABNORMAL LOW (ref 135–145)

## 2019-12-20 LAB — COMPREHENSIVE METABOLIC PANEL
ALT: 32 U/L (ref 0–44)
AST: 65 U/L — ABNORMAL HIGH (ref 15–41)
Albumin: 2.9 g/dL — ABNORMAL LOW (ref 3.5–5.0)
Alkaline Phosphatase: 374 U/L — ABNORMAL HIGH (ref 38–126)
Anion gap: 13 (ref 5–15)
BUN: 94 mg/dL — ABNORMAL HIGH (ref 8–23)
CO2: 24 mmol/L (ref 22–32)
Calcium: 8.1 mg/dL — ABNORMAL LOW (ref 8.9–10.3)
Chloride: 95 mmol/L — ABNORMAL LOW (ref 98–111)
Creatinine, Ser: 2.23 mg/dL — ABNORMAL HIGH (ref 0.61–1.24)
GFR calc Af Amer: 31 mL/min — ABNORMAL LOW (ref >60)
GFR calc non Af Amer: 27 mL/min — ABNORMAL LOW (ref >60)
Glucose, Bld: 128 mg/dL — ABNORMAL HIGH (ref 70–99)
Potassium: 4.4 mmol/L (ref 3.5–5.1)
Sodium: 132 mmol/L — ABNORMAL LOW (ref 135–145)
Total Bilirubin: 1.2 mg/dL (ref 0.3–1.2)
Total Protein: 7.2 g/dL (ref 6.5–8.1)

## 2019-12-20 LAB — HEMOGLOBIN AND HEMATOCRIT, BLOOD
HCT: 24.1 % — ABNORMAL LOW (ref 39.0–52.0)
Hemoglobin: 7.5 g/dL — ABNORMAL LOW (ref 13.0–17.0)

## 2019-12-20 LAB — UREA NITROGEN, URINE: Urea Nitrogen, Ur: 642 mg/dL

## 2019-12-20 LAB — CBC
HCT: 24.5 % — ABNORMAL LOW (ref 39.0–52.0)
Hemoglobin: 7.9 g/dL — ABNORMAL LOW (ref 13.0–17.0)
MCH: 30.7 pg (ref 26.0–34.0)
MCHC: 32.2 g/dL (ref 30.0–36.0)
MCV: 95.3 fL (ref 80.0–100.0)
Platelets: 566 10*3/uL — ABNORMAL HIGH (ref 150–400)
RBC: 2.57 MIL/uL — ABNORMAL LOW (ref 4.22–5.81)
RDW: 14.4 % (ref 11.5–15.5)
WBC: 12.2 10*3/uL — ABNORMAL HIGH (ref 4.0–10.5)
nRBC: 0 % (ref 0.0–0.2)

## 2019-12-20 LAB — GLUCOSE, CAPILLARY
Glucose-Capillary: 108 mg/dL — ABNORMAL HIGH (ref 70–99)
Glucose-Capillary: 128 mg/dL — ABNORMAL HIGH (ref 70–99)
Glucose-Capillary: 131 mg/dL — ABNORMAL HIGH (ref 70–99)
Glucose-Capillary: 140 mg/dL — ABNORMAL HIGH (ref 70–99)
Glucose-Capillary: 153 mg/dL — ABNORMAL HIGH (ref 70–99)
Glucose-Capillary: 162 mg/dL — ABNORMAL HIGH (ref 70–99)

## 2019-12-20 LAB — CALCIUM, IONIZED: Calcium, Ionized, Serum: 4.3 mg/dL — ABNORMAL LOW (ref 4.5–5.6)

## 2019-12-20 MED ORDER — FUROSEMIDE 10 MG/ML IJ SOLN
40.0000 mg | Freq: Three times a day (TID) | INTRAMUSCULAR | Status: DC
  Administered 2019-12-20 – 2019-12-21 (×3): 40 mg via INTRAVENOUS
  Filled 2019-12-20 (×3): qty 4

## 2019-12-20 NOTE — Progress Notes (Addendum)
NAME:  Kelly Williams, MRN:  [id number], DOB:  02/03/1941, LOS: 77 ADMISSION DATE:  12/20/2019, CONSULTATION DATE:  12/12/2019 REFERRING MD:  Oneida Alar, CHIEF COMPLAINT:  Vent management for acute respiratory failure   Brief History   79 year old male with history of recurrent R ICA occlusion post-CEA who developed respoiratory failure requiring reintubation and pneumonia. He initially failed self extubation 8/5 after 24 hours off vent, and was again extubated 8/7 with seemingly good toleration. Then in the early am hours of 8/11, several hours after self-removal of Cortrak, he became unresponsive and hypoxemic requiring reintubation and transfer to ICU.  Patient is awaiting trach placement.  Past Medical History   Past Medical History:  Diagnosis Date  . Alcohol abuse   . Alcohol abuse   . Arthritis   . Carotid arterial disease ([place])   . Chronic kidney disease    CKD-patient denies  . COPD (chronic obstructive pulmonary disease) (HCC)    uses inhalers  . Coronary artery disease   . Diabetes mellitus without complication ([place])   . Diabetic retinopathy ([place])   . Hyperlipidemia   . Hypertension   . Normocytic anemia 01/16/2019  . Stroke [facility])    around 2020, 2 after bypass in 2015   Significant Hospital Events   8/3: admitted to [facility], CEA, PCCM consulted for vent management 8/4: self extubated 8/5: Re-intubated overnight 8/7: Extubated 8/11: Reintubation   Consults:  Neurology   Procedures:  8/3: ETT 8/3: R CEA 8/3: Exploration of R CEA and R ICA thrombectomy 8/3: R art line   Significant Diagnostic Tests:  Carotid duplex 8/3 > right ICA occlusion CTA Head/Neck 8/3 > No acute intracranial abnormality. Post repeat right carotid endarterectomy. Decreased irregularity of the proximal cervical ICA. 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 Persistent narrowing and irregularity of the distal ICA with increased eccentric intraluminal  filling defect, which may reflect unstable/migrated clot. 60-70% stenosis of the proximal left ICA. No proximal intracranial vessel occlusion. Stable findings of multifocal intracranial atherosclerosis. MRI Brain wo Contrast 8/3 > 1. Unchanged appearance of acute right MCA territory infarct. No new site of acute ischemia. 2. Unchanged generalized atrophy and findings of chronic ischemic Microangiopathy. MR Brain wo Contrast 8/6 > Unchanged appearance of a subacute right MCA territory infarct centered within the right frontal operculum as compared to the MRI of 12/03/2019. No significant mass effect or midline shift. Expected evolution of a known subacute infarct within the right parietal lobe. No interval acute infarct is demonstrated. Stable background generalized parenchymal atrophy and chronic ischemic changes with multiple chronic infarcts as detailed. CT Head wo Contrast 8/11 > 1. Stable appearance of subacute and chronic right MCA infarcts. 2. Stable remote infarcts of the high right frontal lobe and left basal ganglia. 3. No acute intracranial abnormality or significant interval change.  Micro Data:  MRSA 8/3 Negative Respiratory Cx 8/5 > Negative  Blood Cx 8/11 > Negative  Respiratory Cx 8/13 > pansensitive pseudomonas MRSA 8/16 Negative  Antimicrobials:  Cefazolin 8/3 Unasyn 8/5 > 8/12 Zosyn 8/12> 8/16 Ceftazidime 8/16>   Interim history/subjective:  No acute events overnight. Improved urine output overnight. Currently tolerating PS.   Objective   Blood pressure (!) 108/58, pulse 68, temperature 98.2 F (36.8 C), temperature source Axillary, resp. rate 18, height 5' 6"  (1.676 m), weight 75.5 kg, SpO2 100 %.    Vent Mode: PRVC FiO2 (%):  [40 %] 40 % Set Rate:  [18 bmp]  18 bmp Vt Set:  [510 mL] 510 mL PEEP:  [5 cmH20] 5 cmH20 Pressure Support:  [10 cmH20] 10 cmH20 Plateau Pressure:  [19 cmH20-26 cmH20] 26 cmH20   Intake/Output Summary (Last 24 hours) at 12/20/2019  0723 Last data filed at 12/20/2019 0700 Gross per 24 hour  Intake 2172.91 ml  Output 1205 ml  Net 967.91 ml   Filed Weights   12/14/19 0500 12/19/19 0500 12/20/19 0500  Weight: 62.2 kg 76.9 kg 75.5 kg   Examination: General: critically ill appearing elderly male, no acute distress, awake and alert tolerating PS  HENT: [place]/AT, EOMI, PERRL, anicteric sclerae, MMM, ETT in place Lungs: diffuse rhonchi with diminished bibasilar lung sounds   Cardiovascular: RRR, S1 and S2 present, no m/r/g, distal pulses intact Abdomen: nondistended, soft, nontender, + bowel sounds  Extremities: warm and dry Neuro: awake and alert; appropriately responding to questions and moving all extremities spontaneously  Assessment & Plan:  Acute Respiratory failure s/p multiple failed extubations  - ENT consulted for trach; recommend medical stabilization prior to trach and as Plavix discontinued on 8/17, will likely proceed with trach after Thursday or Friday - Aggressive bronchial hygiene - Continue SBT trials as tolerated  Bilateral pleural effusions and pulmonary edema:  Slight improvement on CXR this morning following albumin and lasix challenge on 8/18. Currently tolerating pressure support.  - Will continue with albumin and lasix at this time   AKI on CKD:  Hyponatremia Urine studies suggestive of pre-renal etiology. Renal function is stable from yesterday. He did have improved urine output with albumin and lasix challenge. Will increase lasix dosing at this time. - Albumin 25g q6h and Lasix 15m tid - Strict I&O - BMP in afternoon to monitor renal function - Renally adjust all medications and avoid nephrotoxic agents as able   Elevated LFTs: Reported abdominal pain. Alk phos elevated. AST/ALT stable. Total bilirubin upper normal limit. - RUQ [place]  Normocytic anemia:  Received 1u pRBC on 8/15 for acute drop in Hb. FOBT was positive and is noted to have hematuria in urine. Was receiving DAPT which was  discontinued 8/17. Hb 8.5>7.9. Did have bowel movement this morning. No melena or bright red blood noted.  - CBC in PM  - Continue to monitor. Transfuse for Hb<7   R CEA revision:  - Per vascular surgery Best practice:  Diet: tube feeds  Pain/Anxiety/Delirium protocol (if indicated): per protocol VAP protocol (if indicated): per protocol  DVT prophylaxis: SCDs GI prophylaxis: PPI Glucose control: Lantus + SSI Mobility: Bed rest Code Status: FULL Family Communication: will update daughter  Disposition: ICU  Critical care time: 35 minutes     SHarvie Heck MD Internal Medicine, PGY-2 12/20/19 7:23 AM Pager # [phone]

## 2019-12-20 NOTE — Progress Notes (Signed)
Physical Therapy Treatment Patient Details Name: Kelly Williams MRN: [id number] DOB: [birth year] Today's Date: 12/20/2019    History of Present Illness Pt is a 79 y/o male with PMH of stroke, HTN, DM, periphreal neuropathy, carotid disease with L carotid endarterectomy, CABG x 5, R shoulder arthroplasthy, medication noncompliance and alcohol abuse. Daughter found patient after falling and intoxicated, L facial droop, slurring speech and aggression (reports hx of mulitple falls). MRI reveals acute ischemic infarcts of R frontal and parietal lobes. MRA of neck revealed focal stenosis in the right internal carotid artery approximately 3 cm distal to the bifurcation with an adjacent 11 mm aneurysmal dilatation. Pt underwent R carotid endarterectomy on 8/3. Pt with change in neuro status after procedure and was taken back to OR for exploration with no significant findings. Pt required intubation on 8/5 due to desaturation, extubated 12/08/19.  Reintubated on 8/11 due to hypoxia and transferred to ICU    PT Comments    Goals reviewed and updated as needed.  Pt is lethargic today, quite edematous in all 4 extremities. He worked a bit from high chair mode on attempting to pull forward into sitting and preforming 4 extremity exercises starting out AAROM and then quickly fatiguing to PROM.  PT will continue to follow acutely for safe mobility progression.   Follow Up Recommendations  SNF     Equipment Recommendations  Wheelchair (measurements PT);Wheelchair cushion (measurements PT);Hospital bed    Recommendations for Other Services       Precautions / Restrictions Precautions Precautions: Fall Precaution Comments: ET; has self extubated    Mobility  Bed Mobility Overal bed mobility: Needs Assistance Bed Mobility: Supine to Sit     Supine to sit: +2 for physical assistance;Max assist     General bed mobility comments: sat up from max chair position in bed using bil UEs to pull and support at  trunk to come forward of two people.  Pt tolerated two sitting bouts once for ~1.5 mins and second one for ~35 seconds.  Max assist to suppot trunk and manual hand over hand assist for hand placement on railing.   Transfers                    Ambulation/Gait                 Stairs             Wheelchair Mobility    Modified Rankin (Stroke Patients Only)       Balance Overall balance assessment: Needs assistance Sitting-balance support: Feet unsupported;Bilateral upper extremity supported Sitting balance-Leahy Scale: Zero Sitting balance - Comments: max assist from bed chair position.                                     Cognition Arousal/Alertness: Lethargic Behavior During Therapy: WFL for tasks assessed/performed Overall Cognitive Status: Difficult to assess                                        Exercises General Exercises - Upper Extremity Shoulder Flexion: PROM;Both;10 reps;Supine (pt's scapula bil are not moving well) Elbow Flexion: PROM;Both;10 reps;Supine Wrist Extension: PROM;Both;10 reps General Exercises - Lower Extremity Ankle Circles/Pumps: PROM;AAROM;Both;10 reps Short Arc Quad[place];Both;10 reps Heel Slides: PROM;Both;10 reps Hip ABduction/ADduction: PROM;Both;10 reps Other Exercises Other Exercises:  cervical ROM R rotation and right side bend.  Preferenced left due to vent.    General Comments General comments (skin integrity, edema, etc.): PRVC vent with FiO2 40% and PEEP 5, per RN significant scrotal edema (and 4 extremity edema) likely causing his pain.       Pertinent Vitals/Pain Pain Assessment: Faces Faces Pain Scale: Hurts even more Pain Location: discomfort Pain Descriptors / Indicators: Discomfort;Guarding Pain Intervention(s): Limited activity within patient's tolerance;Monitored during session;Repositioned    Home Living                      Prior Function             PT Goals (current goals can now be found in the care plan section) Acute Rehab PT Goals Patient Stated Goal: pt unable to state ETT PT Goal Formulation: Patient unable to participate in goal setting Time For Goal Achievement: 01/03/20 Potential to Achieve Goals: Good Progress towards PT goals: Progressing toward goals    Frequency    Min 2X/week      PT Plan Current plan remains appropriate    Co-evaluation              AM-PAC PT "6 Clicks" Mobility   Outcome Measure  Help needed turning from your back to your side while in a flat bed without using bedrails?: Total Help needed moving from lying on your back to sitting on the side of a flat bed without using bedrails?: Total Help needed moving to and from a bed to a chair (including a wheelchair)?: Total Help needed standing up from a chair using your arms (e.g., wheelchair or bedside chair)?: Total Help needed to walk in hospital room?: Total Help needed climbing 3-5 steps with a railing? : Total 6 Click Score: 6    End of Session Equipment Utilized During Treatment: Oxygen Activity Tolerance: Patient limited by pain;Patient limited by fatigue   Nurse Communication: Mobility status;Other (comment) (lethargy, left in high chair mode.) PT Visit Diagnosis: Other abnormalities of gait and mobility (R26.89);Other symptoms and signs involving the nervous system (N02.725)     Time: 3664-4034 PT Time Calculation (min) (ACUTE ONLY): 26 min  Charges:  $Therapeutic Exercise: 8-22 mins $Therapeutic Activity: 8-22 mins                     Verdene Lennert, PT, DPT  Acute Rehabilitation [phone] pager [phone]) [phone] office

## 2019-12-20 NOTE — Progress Notes (Signed)
[facility]  Subjective  - follows commands   Objective (!) 108/58 68 98.2 F (36.8 C) (Axillary) 18 100%  Intake/Output Summary (Last 24 hours) at 12/20/2019 0758 Last data filed at 12/20/2019 0700 Gross per 24 hour  Intake 2172.91 ml  Output 1205 ml  Net 967.91 ml   Minimal vent settings tolerating wean Right neck incision healing Neuro intact  Assessment/Planning: POD #16 redo right CEA complicated by swallowing difficulties and pnemonia  Hopefully extubate or trach soon so we can start rehab  Leukocytosis ? Worse chest xray but tolerating wean  Renal function fairly stable with slight increase of creat 2 up from 1.5 -1.8 baseline  Tube feeds tolerating  Hemoglobin stable over last 48 hr  Ruta Hinds 12/20/2019 7:58 AM --  Laboratory Lab Results: Recent Labs    12/18/19 0601 12/19/19 0905  WBC 10.8* 13.5*  HGB 7.8* 8.5*  HCT 24.1* 26.8*  PLT 461* 529*   BMET Recent Labs    12/19/19 0905 12/19/19 1553  NA 130* 133*  K 4.5 4.6  CL 94* 95*  CO2 23 26  GLUCOSE 196* 135*  BUN 87* 88*  CREATININE 2.24* 2.19*  CALCIUM 7.5* 7.8*    COAG Lab Results  Component Value Date   INR 1.2 12/17/2019   INR 1.0 12/09/2019   INR 1.0 11/26/2019   No results found for: PTT

## 2019-12-21 ENCOUNTER — Inpatient Hospital Stay (HOSPITAL_COMMUNITY): Payer: Medicare HMO

## 2019-12-21 LAB — COMPREHENSIVE METABOLIC PANEL
ALT: 33 U/L (ref 0–44)
AST: 73 U/L — ABNORMAL HIGH (ref 15–41)
Albumin: 3.1 g/dL — ABNORMAL LOW (ref 3.5–5.0)
Alkaline Phosphatase: 393 U/L — ABNORMAL HIGH (ref 38–126)
Anion gap: 13 (ref 5–15)
BUN: 107 mg/dL — ABNORMAL HIGH (ref 8–23)
CO2: 24 mmol/L (ref 22–32)
Calcium: 8.4 mg/dL — ABNORMAL LOW (ref 8.9–10.3)
Chloride: 95 mmol/L — ABNORMAL LOW (ref 98–111)
Creatinine, Ser: 2.33 mg/dL — ABNORMAL HIGH (ref 0.61–1.24)
GFR calc Af Amer: 30 mL/min — ABNORMAL LOW (ref >60)
GFR calc non Af Amer: 26 mL/min — ABNORMAL LOW (ref >60)
Glucose, Bld: 136 mg/dL — ABNORMAL HIGH (ref 70–99)
Potassium: 4.9 mmol/L (ref 3.5–5.1)
Sodium: 132 mmol/L — ABNORMAL LOW (ref 135–145)
Total Bilirubin: 1.4 mg/dL — ABNORMAL HIGH (ref 0.3–1.2)
Total Protein: 6.5 g/dL (ref 6.5–8.1)

## 2019-12-21 LAB — URINALYSIS, ROUTINE W REFLEX MICROSCOPIC
Bilirubin Urine: NEGATIVE
Glucose, UA: NEGATIVE mg/dL
Hgb urine dipstick: NEGATIVE
Ketones, ur: NEGATIVE mg/dL
Nitrite: NEGATIVE
Protein, ur: 100 mg/dL — AB
Specific Gravity, Urine: 1.016 (ref 1.005–1.030)
pH: 5 (ref 5.0–8.0)

## 2019-12-21 LAB — POCT I-STAT 7, (LYTES, BLD GAS, ICA,H+H)
Acid-Base Excess: 0 mmol/L (ref 0.0–2.0)
Bicarbonate: 25.3 mmol/L (ref 20.0–28.0)
Calcium, Ion: 1.06 mmol/L — ABNORMAL LOW (ref 1.15–1.40)
HCT: 24 % — ABNORMAL LOW (ref 39.0–52.0)
Hemoglobin: 8.2 g/dL — ABNORMAL LOW (ref 13.0–17.0)
O2 Saturation: 91 %
Patient temperature: 99.2
Potassium: 5.3 mmol/L — ABNORMAL HIGH (ref 3.5–5.1)
Sodium: 133 mmol/L — ABNORMAL LOW (ref 135–145)
TCO2: 27 mmol/L (ref 22–32)
pCO2 arterial: 41.7 mmHg (ref 32.0–48.0)
pH, Arterial: 7.392 (ref 7.350–7.450)
pO2, Arterial: 62 mmHg — ABNORMAL LOW (ref 83.0–108.0)

## 2019-12-21 LAB — CBC
HCT: 24.3 % — ABNORMAL LOW (ref 39.0–52.0)
Hemoglobin: 7.8 g/dL — ABNORMAL LOW (ref 13.0–17.0)
MCH: 30.6 pg (ref 26.0–34.0)
MCHC: 32.1 g/dL (ref 30.0–36.0)
MCV: 95.3 fL (ref 80.0–100.0)
Platelets: 523 10*3/uL — ABNORMAL HIGH (ref 150–400)
RBC: 2.55 MIL/uL — ABNORMAL LOW (ref 4.22–5.81)
RDW: 14.6 % (ref 11.5–15.5)
WBC: 10 10*3/uL (ref 4.0–10.5)
nRBC: 0 % (ref 0.0–0.2)

## 2019-12-21 LAB — GLUCOSE, CAPILLARY
Glucose-Capillary: 126 mg/dL — ABNORMAL HIGH (ref 70–99)
Glucose-Capillary: 115 mg/dL — ABNORMAL HIGH (ref 70–99)
Glucose-Capillary: 153 mg/dL — ABNORMAL HIGH (ref 70–99)
Glucose-Capillary: 198 mg/dL — ABNORMAL HIGH (ref 70–99)
Glucose-Capillary: 203 mg/dL — ABNORMAL HIGH (ref 70–99)

## 2019-12-21 LAB — PROTEIN / CREATININE RATIO, URINE
Creatinine, Urine: 99.57 mg/dL
Protein Creatinine Ratio: 1.33 mg/mg{Cre} — ABNORMAL HIGH (ref 0.00–0.15)
Total Protein, Urine: 132 mg/dL

## 2019-12-21 LAB — BASIC METABOLIC PANEL
Anion gap: 12 (ref 5–15)
BUN: 117 mg/dL — ABNORMAL HIGH (ref 8–23)
CO2: 24 mmol/L (ref 22–32)
Calcium: 8.5 mg/dL — ABNORMAL LOW (ref 8.9–10.3)
Chloride: 96 mmol/L — ABNORMAL LOW (ref 98–111)
Creatinine, Ser: 2.57 mg/dL — ABNORMAL HIGH (ref 0.61–1.24)
GFR calc Af Amer: 26 mL/min — ABNORMAL LOW (ref >60)
GFR calc non Af Amer: 23 mL/min — ABNORMAL LOW (ref >60)
Glucose, Bld: 203 mg/dL — ABNORMAL HIGH (ref 70–99)
Potassium: 5.4 mmol/L — ABNORMAL HIGH (ref 3.5–5.1)
Sodium: 132 mmol/L — ABNORMAL LOW (ref 135–145)

## 2019-12-21 MED ORDER — SODIUM ZIRCONIUM CYCLOSILICATE 5 G PO PACK
5.0000 g | PACK | Freq: Once | ORAL | Status: AC
  Administered 2019-12-21: 5 g
  Filled 2019-12-21: qty 1

## 2019-12-21 MED ORDER — FUROSEMIDE 10 MG/ML IJ SOLN
10.0000 mg/h | INTRAVENOUS | Status: DC
  Administered 2019-12-21: 10 mg/h via INTRAVENOUS
  Filled 2019-12-21: qty 25

## 2019-12-21 MED ORDER — FUROSEMIDE 10 MG/ML IJ SOLN
120.0000 mg | Freq: Three times a day (TID) | INTRAVENOUS | Status: DC
  Administered 2019-12-21 – 2019-12-22 (×3): 120 mg via INTRAVENOUS
  Filled 2019-12-21 (×5): qty 12

## 2019-12-21 NOTE — Progress Notes (Signed)
Wasted 22 mL fentanyl from old tubing with Carney Harder RN.

## 2019-12-21 NOTE — Progress Notes (Addendum)
 NAME:  Kelly Williams, MRN:  [id number], DOB:  12/08/1940, LOS: 17 ADMISSION DATE:  12/19/2019, CONSULTATION DATE:  12/12/2019 REFERRING MD:  Fields, CHIEF COMPLAINT:  Vent management for acute respiratory failure   Brief History   79 year old male with history of recurrent R ICA occlusion post-CEA who developed respoiratory failure requiring reintubation and pneumonia. He initially failed self extubation 8/5 after 24 hours off vent, and was again extubated 8/7 with seemingly good toleration. Then in the early am hours of 8/11, several hours after self-removal of Cortrak, he became unresponsive and hypoxemic requiring reintubation and transfer to ICU.  Patient is awaiting trach placement.  Past Medical History   Past Medical History:  Diagnosis Date  . Alcohol abuse   . Alcohol abuse   . Arthritis   . Carotid arterial disease (HCC)   . Chronic kidney disease    CKD-patient denies  . COPD (chronic obstructive pulmonary disease) (HCC)    uses inhalers  . Coronary artery disease   . Diabetes mellitus without complication ([facility])   . Diabetic retinopathy ([facility])   . Hyperlipidemia   . Hypertension   . Normocytic anemia 01/16/2019  . Stroke ([facility])    around 2020, 2 after bypass in 2015   Significant Hospital Events   8/3: admitted to [facility], CEA, PCCM consulted for vent management 8/4: self extubated 8/5: Re-intubated overnight 8/7: Extubated 8/11: Reintubation   Consults:  Neurology   Procedures:  8/3: ETT 8/3: R CEA 8/3: Exploration of R CEA and R ICA thrombectomy 8/3: R art line   Significant Diagnostic Tests:  Carotid duplex 8/3 > right ICA occlusion CTA Head/Neck 8/3 > No acute intracranial abnormality. Post repeat right carotid endarterectomy. Decreased irregularity of the proximal cervical ICA. 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 Persistent narrowing and irregularity of the distal ICA with increased eccentric intraluminal  filling defect, which may reflect unstable/migrated clot. 60-70% stenosis of the proximal left ICA. No proximal intracranial vessel occlusion. Stable findings of multifocal intracranial atherosclerosis. MRI Brain wo Contrast 8/3 > 1. Unchanged appearance of acute right MCA territory infarct. No new site of acute ischemia. 2. Unchanged generalized atrophy and findings of chronic ischemic Microangiopathy. MR Brain wo Contrast 8/6 > Unchanged appearance of a subacute right MCA territory infarct centered within the right frontal operculum as compared to the MRI of 12/20/2019. No significant mass effect or midline shift. Expected evolution of a known subacute infarct within the right parietal lobe. No interval acute infarct is demonstrated. Stable background generalized parenchymal atrophy and chronic ischemic changes with multiple chronic infarcts as detailed. CT Head wo Contrast 8/11 > 1. Stable appearance of subacute and chronic right MCA infarcts. 2. Stable remote infarcts of the high right frontal lobe and left basal ganglia. 3. No acute intracranial abnormality or significant interval change.  Micro Data:  MRSA 8/3 Negative Respiratory Cx 8/5 > Negative  Blood Cx 8/11 > Negative  Respiratory Cx 8/13 > pansensitive pseudomonas MRSA 8/16 Negative  Antimicrobials:  Cefazolin 8/3 Unasyn 8/5 > 8/12 Zosyn 8/12> 8/16 Ceftazidime 8/16> 8/17  Interim history/subjective:    Objective   Blood pressure 129/68, pulse 77, temperature 99.4 F (37.4 C), temperature source Axillary, resp. rate 18, height 5' 6" (1.676 m), weight 79.2 kg, SpO2 97 %.    Vent Mode: PRVC FiO2 (%):  [30 %-40 %] 30 % Set Rate:  [18 bmp] 18 bmp Vt Set:  [510 mL] 510 mL PEEP:  [  [place] Pressure:  [15 cmH20-29 cmH20] 23 cmH20   Intake/Output Summary (Last 24 hours) at 12/21/2019 0805 Last data filed at 12/21/2019 0700 Gross per 24 hour  Intake 2032.62 ml  Output 832 ml  Net 1200.62 ml   Filed  Weights   12/19/19 0500 12/20/19 0500 12/21/19 0500  Weight: 76.9 kg 75.5 kg 79.2 kg   Examination: General: critically ill appearing elderly male, no acute distress, somnolent; tolerating PS  HENT: [place]/AT, EOMI, PERRL, anicteric sclerae, MMM, ETT in place Lungs: diffuse rhonchi with diminished bibasilar lung sounds   Cardiovascular: RRR, S1 and S2 present, no m/r/g, distal pulses intact Abdomen: nondistended, soft, nontender, + bowel sounds  Extremities: warm and dry; diffusely edematous  Neuro: somnolent but arousable to loud voice and physical stimuli; appropriately following commands.   Assessment & Plan:  Acute Respiratory failure s/p multiple failed extubations  - Scheduled for trach placement with ENT on 8/21 - Aggressive bronchial hygiene - Continue SBT trials as tolerated  Bilateral pleural effusions and pulmonary edema:  Currently tolerating pressure support; CXR this morning with worsening pulmonary edema despite lasix and albumin challenge.  - Will increase lasix at this time to aid volume removal   AKI on CKD:  Hyponatremia Urine studies suggestive of pre-renal etiology. Renal function is stable from yesterday. No significant increase in urine output. Will increase to lasix gtt at this time and continue with albumin.  - Albumin 25g q6h with lasix gtt  - Strict I&O - BMP in afternoon to monitor renal function - Renally adjust all medications and avoid nephrotoxic agents as able  - Will consult nephrology pending urine output and renal function   Elevated LFTs: Alkaline phosphatase elevated. RUQ [place] without any biliary duct dilation. Suspect intrahepatic cholestasis. - Continue to monitor   Normocytic anemia:  Received 1u pRBC on 8/15 for acute drop in Hb. Hb stable ~8 at this time. Did have bowel movement this morning. No melena or bright red blood noted.  - Trend CBC  - Continue to monitor. Transfuse for Hb<7   R CEA revision:  - Per vascular surgery  Best  practice:  Diet: tube feeds  Pain/Anxiety/Delirium protocol (if indicated): per protocol VAP protocol (if indicated): per protocol  DVT prophylaxis: SCDs GI prophylaxis: PPI Glucose control: Lantus + SSI Mobility: Bed rest Code Status: FULL Family Communication: Patient's daughter, Hershel Corkery, updated via telephone  Disposition: ICU  Critical care time: 35 minutes     Harvie Heck, MD Internal Medicine, PGY-2 12/21/19 8:05 AM Pager # [phone]   Pulmonary critical care attending:  This is a 79 year old gentleman with recurrent right ICA occlusion post CEA developed acute respiratory failure requiring intubation developed pneumonia.  He failed self extubation.  Was extubated again and failed requiring reintubation.  Has been on Plavix awaiting tracheostomy tube placement.  Plavix was held this past week.  I called and spoke with Dr. Benjamine Mola from ENT who is planning for tracheostomy tube placement tomorrow morning in the operating room.  BP 135/66   Pulse 80   Temp 98.7 F (37.1 C) (Axillary)   Resp 12   Ht 5' 6" (1.676 m)   Wt 79.2 kg   SpO2 97%   BMI 28.18 kg/m   General: Elderly male, intubated on mechanical life support does follow some basic commands. HEENT: Tracks appropriately Heart: Regular rhythm S1-S2 Lungs: Bilateral mechanically ventilated breath sounds Abdomen: No significant edemak, abdominal pain with palpation.  Assessment: Acute hypoxemic respiratory failure requiring  intubation mechanical ventilation Failed recurrent intubations due to inability to protect airway. Plans for mechanical ventilation through tracheostomy. Fluid overload, bilateral pleural effusions pulmonary edema AKI on CKD. Elevated alk phos, normal right upper quadrant ultrasound Right CEA revision per vascular surgery.  Plan: Tracheostomy tube planned for tomorrow by ENT Continue to hold Plavix at this time. Consult to nephrology for worsening AKI We appreciate their  input. Continue IV diuresis  This patient is critically ill with multiple organ system failure; which, requires frequent high complexity decision making, assessment, support, evaluation, and titration of therapies. This was completed through the application of advanced monitoring technologies and extensive interpretation of multiple databases. During this encounter critical care time was devoted to patient care services described in this note for 32 minutes.  Garner Nash, DO [facility] 12/21/2019 3:23 PM

## 2019-12-21 NOTE — Consult Note (Signed)
Nephrology Consult   Requesting provider: Ruta Hinds Service requesting consult: CCM Reason for consult: AKI on CKD G3   Assessment/Recommendations: Kelly Williams is a/an 79 y.o. male with a past medical history alcohol use disorder, PAD, COPD CAD, DM 2, HLD, HTN, CVA who present w/ endarterectomy revision complicated by ventilatory dependence and AKI  Severe nonoliguric AKI on CKD G3 secondary to cardiorenal syndrome: Patient appears volume overloaded.  Urine sodium less than 10.  Worsening respiratory status in setting of increased volume more consistent with cardiorenal syndrome.  Possible ATN but urine sodium would suggest otherwise.  Will increase diuresis, currently on furosemide drip but did not get bolused immediately before so he is unlikely to reach threshold this way.  Likely needs higher dose diuretics given his GFR -Start Lasix 120 mg IV 3 times daily -Monitor renal function panel twice daily -Can stop albumin since serum albumin is greater than 3 and intravascular space is increased -Repeat urinalysis, obtain UPC -Dose meds for GFR -Monitor Daily I/Os, Daily weight  -Maintain MAP>65 for optimal renal perfusion.  -Avoid nephrotoxic medications including NSAIDs and Vanc/Zosyn combo -Renal ultrasound on 8/18 without obstruction -Currently no indication for HD  Volume Status: Appears volume overloaded on exam.  Based on her examination and review of available data we recommend diuretics as above.  Hypertension: Moderately well controlled at this time.  Diuresis as above.  Continue metoprolol.  Chronic pain: Would stop gabapentin 300 mg twice daily given he is sedated at this time and is renal function is decreased  Hypoxic respiratory failure/ventilatory dependence: Management per primary team  Anemia: Multifactorial kidney disease contributing minimally.  Continue to monitor  Hyponatremia: Sodium 133.  Likely related to volume overload.  Continue to monitor and diuresis  as above. Hyperkalemia: Potassium 5.3.  Minimally elevated at this time.  Should improve with diuresis.   Recommendations conveyed to primary service.    [facility] 12/21/2019 2:37 PM   _____________________________________________________________________________________ CC: AKI  History of Present Illness: Kelly Williams is a/an 79 y.o. male with a past medical history of alcohol use disorder, PAD, COPD CAD, DM 2, HLD, HTN, CVA who presents with respiratory failure requiring intubation and pneumonia.  The patient has been admitted since 8/3 initially admitted for right carotid endarterectomy.  His hospitalization has been long and complicated related to extubations and reintubations.  He self extubated on 8/4 but was reintubated on 8/5.  He was extubated on 8/7 again and reintubated on 8/11.  He is remained on the ventilator since that time.  Pulmonology has been trying to wean the patient from the ventilator is much as possible.  It is been noted more recently that the patient has had worsening total body volume overload.  Based on chart review it appears that the patient's baseline creatinine is around 1.3.  The patient's creatinine has been closer to 1.5-1.8 during this hospitalization.  The patient's creatinine has been slowly rising since 8/11.  It is gone from 1.35 to 2.3.  Urine studies on 8/18 demonstrated RBCs, minimal protein, urine sodium less than 10.  Hydration was attempted with minimal improvement.  Given his total body volume overload diuretics have also been used.  1.8 on 8/18 and has been repleted up to 3.1.  Echocardiogram on 11/27/2019 demonstrated ejection fraction of 50 to 55%.  It is been noted recently that he has been harder to ventilate.  He received furosemide 40 mg twice daily yesterday and was started on furosemide infusion  at 10 mg today.   Medications:  Current Facility-Administered Medications  Medication Dose Route Frequency  Provider Last Rate Last Admin  . 0.9 %  sodium chloride infusion (Manually program via Guardrails IV Fluids)   Intravenous Once Anders Simmonds, MD      . 0.9 %  sodium chloride infusion   Intravenous PRN Omar Person, NP   Stopped at 12/19/19 2039  . acetaminophen (TYLENOL) tablet 325-650 mg  325-650 mg Per Tube Q4H PRN Elam Dutch, MD   650 mg at 12/16/19 1148   Or  . acetaminophen (TYLENOL) suppository 325-650 mg  325-650 mg Rectal Q4H PRN Elam Dutch, MD      . albumin human 25 % solution 25 g  25 g Intravenous Q6H Aslam, Sadia, MD 60 mL/hr at 12/21/19 1428 25 g at 12/21/19 1428  . atorvastatin (LIPITOR) tablet 80 mg  80 mg Per Tube QHS Elam Dutch, MD   80 mg at 12/20/19 2218  . bethanechol (URECHOLINE) tablet 10 mg  10 mg Per Tube TID Corey Harold, NP   10 mg at 12/21/19 0902  . budesonide (PULMICORT) nebulizer solution 0.5 mg  0.5 mg Nebulization BID Rigoberto Noel, MD   0.5 mg at 12/21/19 0807  . chlorhexidine gluconate (MEDLINE KIT) (PERIDEX) 0.12 % solution 15 mL  15 mL Mouth Rinse BID Elam Dutch, MD   15 mL at 12/21/19 0825  . Chlorhexidine Gluconate Cloth 2 % PADS 6 each  6 each Topical Q0600 Elam Dutch, MD   6 each at 12/20/19 0200  . dexmedetomidine (PRECEDEX) 400 MCG/100ML (4 mcg/mL) infusion  0.4-1.2 mcg/kg/hr Intravenous Titrated Ogan, Okoronkwo U, MD 6.22 mL/hr at 12/21/19 1400 0.4 mcg/kg/hr at 12/21/19 1400  . docusate (COLACE) 50 MG/5ML liquid 100 mg  100 mg Per Tube BID PRN Maudie Mercury, MD      . feeding supplement (JEVITY 1.2 CAL) liquid 1,000 mL  1,000 mL Per Tube Continuous Elam Dutch, MD 50 mL/hr at 12/21/19 0827 1,000 mL at 12/21/19 0827  . feeding supplement (PROSource TF) liquid 45 mL  45 mL Per Tube TID Elam Dutch, MD   45 mL at 12/21/19 0902  . fentaNYL (SUBLIMAZE) injection 25 mcg  25 mcg Intravenous Q15 min PRN Collene Gobble, MD   25 mcg at 12/16/19 2221  . fentaNYL (SUBLIMAZE) injection 25-100 mcg  25-100  mcg Intravenous Q30 min PRN Collene Gobble, MD   50 mcg at 12/19/19 0404  . fentaNYL 2541mg in NS 2542m(1064mml) infusion-PREMIX  0-400 mcg/hr Intravenous Continuous OgaFrederik PearD 5 mL/hr at 12/21/19 1400 50 mcg/hr at 12/21/19 1400  . furosemide (LASIX) 250 mg in dextrose 5 % 250 mL (1 mg/mL) infusion  10 mg/hr Intravenous Continuous Aslam, Sadia, MD 10 mL/hr at 12/21/19 1400 10 mg/hr at 12/21/19 1400  . gabapentin (NEURONTIN) capsule 300 mg  300 mg Per Tube BID DesShearon Stallsahul P, PA-C   300 mg at 12/21/19 0902  . heparin injection 5,000 Units  5,000 Units Subcutaneous Q8H DanJohnnette Gourd RPH   5,000 Units at 12/21/19 1424  . hydrALAZINE (APRESOLINE) injection 10-20 mg  10-20 mg Intravenous Q4H PRN HofCorey HaroldP   20 mg at 12/21/19 0902  . insulin aspart (novoLOG) injection 0-15 Units  0-15 Units Subcutaneous Q4H OgaFrederik PearD   3 Units at 12/21/19 1153  . insulin glargine (LANTUS) injection 15 Units  15 Units Subcutaneous Daily Crim,  Loma Sousa, MD   15 Units at 12/21/19 0902  . ipratropium-albuterol (DUONEB) 0.5-2.5 (3) MG/3ML nebulizer solution 3 mL  3 mL Nebulization Q6H PRN [name], PA-C   3 mL at 12/11/19 0522  . ipratropium-albuterol (DUONEB) 0.5-2.5 (3) MG/3ML nebulizer solution 3 mL  3 mL Nebulization TID Elam Dutch, MD   3 mL at 12/21/19 0807  . loperamide HCl (IMODIUM) 1 MG/7.5ML suspension 2 mg  2 mg Per Tube PRN Clydell Hakim, MD      . MEDLINE mouth rinse  15 mL Mouth Rinse 10 times per day Elam Dutch, MD   15 mL at 12/21/19 1432  . metoprolol tartrate (LOPRESSOR) injection 2-5 mg  2-5 mg Intravenous Q2H PRN [name], PA-C      . multivitamin with minerals tablet 1 tablet  1 tablet Per Tube Daily [name], MD   1 tablet at 12/21/19 0902  . pantoprazole sodium (PROTONIX) 40 mg/20 mL oral suspension 40 mg  40 mg Per Tube QHS Elam Dutch, MD   40 mg at 12/20/19 2224  . phenol (CHLORASEPTIC) mouth spray 1 spray  1 spray  Mouth/Throat PRN [name], PA-C      . polyethylene glycol (MIRALAX / GLYCOLAX) packet 17 g  17 g Per Tube Daily PRN Maudie Mercury, MD      . potassium chloride SA (KLOR-CON) CR tablet 20-40 mEq  20-40 mEq Oral Daily PRN [name], PA-C         ALLERGIES Metformin and related  MEDICAL HISTORY Past Medical History:  Diagnosis Date  . Alcohol abuse   . Alcohol abuse   . Arthritis   . Carotid arterial disease ([place])   . Chronic kidney disease    CKD-patient denies  . COPD (chronic obstructive pulmonary disease) ([facility])    uses inhalers  . Coronary artery disease   . Diabetes mellitus without complication ([place])   . Diabetic retinopathy ([place])   . Hyperlipidemia   . Hypertension   . Normocytic anemia 01/16/2019  . Stroke [facility])    around 2020, 2 after bypass in 2015     SOCIAL HISTORY Social History   Socioeconomic History  . Marital status: Divorced    Spouse name: Not on file  . Number of children: Not on file  . Years of education: Not on file  . Highest education level: Not on file  Occupational History  . Not on file  Tobacco Use  . Smoking status: Former Smoker    Packs/day: 1.00    Years: 10.00    Pack years: 10.00    Types: Cigarettes    Quit date: 10/12/2002    Years since quitting: 17.2  . Smokeless tobacco: Never Used  Vaping Use  . Vaping Use: Never used  Substance and Sexual Activity  . Alcohol use: Not Currently  . Drug use: Never  . Sexual activity: Not Currently    Birth control/protection: Abstinence, Surgical  Other Topics Concern  . Not on file  Social History Narrative  . Not on file   Social Determinants of Health   Financial Resource Strain:   . Difficulty of Paying Living Expenses: Not on file  Food Insecurity:   . Worried About Charity fundraiser in the Last Year: Not on file  . Ran Out of Food in the Last Year: Not on file  Transportation Needs:   . Lack of Transportation (Medical): Not on file  . Lack of  Transportation (Non-Medical): Not on file  Physical Activity:   . Days of Exercise per Week: Not on file  . Minutes of Exercise per Session: Not on file  Stress:   . Feeling of Stress : Not on file  Social Connections:   . Frequency of Communication with Friends and Family: Not on file  . Frequency of Social Gatherings with Friends and Family: Not on file  . Attends Religious Services: Not on file  . Active Member of Clubs or Organizations: Not on file  . Attends Archivist Meetings: Not on file  . Marital Status: Not on file  Intimate Partner Violence:   . Fear of Current or Ex-Partner: Not on file  . Emotionally Abused: Not on file  . Physically Abused: Not on file  . Sexually Abused: Not on file     FAMILY HISTORY Family History  Problem Relation Age of Onset  . Hypertension Mother   . Hypertension Father       Review of Systems: Unable to perform ROS due to sedation and AMS  Physical Exam: Vitals:   12/21/19 1300 12/21/19 1400  BP: (!) 125/54 117/68  Pulse: 80 80  Resp: 12 11  Temp:    SpO2: 99% 100%   Total I/O In: 508.8 [I.V.:83.1; NG/GT:350; IV Piggyback:75.6] Out: -   Intake/Output Summary (Last 24 hours) at 12/21/2019 1437 Last data filed at 12/21/2019 1400 Gross per 24 hour  Intake 2059.18 ml  Output 832 ml  Net 1227.18 ml   General: Ill-appearing, lying in bed, no distress HEENT: anicteric sclera, ET tube in place CV: Normal rate, no audible murmur, 2+ pitting edema in all extremities Lungs: Coarse bilateral breath sounds, bilateral chest rise, ventilated Abd: soft, non-tender, non-distended Skin: Scattered bruising, otherwise no visible lesions or rashes Psych: Sedated, does not respond to pain Musculoskeletal: no obvious deformities Neuro: Unable to assess given sedation  Test Results Reviewed Lab Results  Component Value Date   NA 133 (L) 12/21/2019   K 5.3 (H) 12/21/2019   CL 95 (L) 12/21/2019   CO2 24 12/21/2019   BUN 107  (H) 12/21/2019   CREATININE 2.33 (H) 12/21/2019   CALCIUM 8.4 (L) 12/21/2019   ALBUMIN 3.1 (L) 12/21/2019   PHOS 3.5 12/12/2019     I have reviewed all relevant outside healthcare records related to the patient's kidney injury.

## 2019-12-21 NOTE — Progress Notes (Signed)
[facility]  Subjective  - following some commands but seems less interactive   Objective (!) 185/162 97 99.5 F (37.5 C) (Axillary) 15 94%  Intake/Output Summary (Last 24 hours) at 12/21/2019 0932 Last data filed at 12/21/2019 0700 Gross per 24 hour  Intake 1928.2 ml  Output 832 ml  Net 1096.2 ml   Diffuse anasarca Right neck incision healing Moves right side moves left side less than he has   Assessment/Planning: Has been doing SBT trials but it seems no definitive plan for trach or extubation He still has sedation running not sure if this is cause for decreased mental status Can we eliminate the sedation? Otherwise need to consider head CT  Nutrition on tube feeds albumin 2.9 yesterday  Anemia stable hemoglobin over the last several days.  He has been off plavix and aspirin for 3 days.  Would consider restarting this if trach not imminent.  Will discuss with critical care overall plan  Ruta Hinds 12/21/2019 9:32 AM --  Laboratory Lab Results: Recent Labs    12/20/19 0905 12/20/19 0905 12/20/19 1406 12/21/19 0617  WBC 12.2*  --   --  10.0  HGB 7.9*   < > 7.5* 7.8*  HCT 24.5*   < > 24.1* 24.3*  PLT 566*  --   --  523*   < > = values in this interval not displayed.   BMET Recent Labs    12/20/19 1406 12/21/19 0617  NA 133* 132*  K 5.0 4.9  CL 95* 95*  CO2 25 24  GLUCOSE 118* 136*  BUN 98* 107*  CREATININE 2.29* 2.33*  CALCIUM 8.3* 8.4*    COAG Lab Results  Component Value Date   INR 1.2 12/17/2019   INR 1.0 12/03/2019   INR 1.0 11/26/2019   No results found for: PTT

## 2019-12-21 NOTE — Progress Notes (Signed)
Subjective: Intubated, on vent. Minimally responsive.  Objective: Vital signs in last 24 hours: Temp:  [97.3 F (36.3 C)-99.5 F (37.5 C)] 99.2 F (37.3 C) (08/20 1130) Pulse Rate:  [58-140] 95 (08/20 1100) Resp:  [15-20] 16 (08/20 1100) BP: (102-185)/(59-162) 130/64 (08/20 1100) SpO2:  [94 %-100 %] 95 % (08/20 1100) FiO2 (%):  [30 %-40 %] 30 % (08/20 0700) Weight:  [79.2 kg] 79.2 kg (08/20 0500)  Head: NCAT. No lesion. Ears: Examination of the ears shows normal auricles and external auditory canals bilaterally.  Nose: Nasal examination shows normal mucosa, septum, turbinates.  Face: Facial examination shows no asymmetry.  Mouth: Oral cavity examination shows no mucosal lacerations. An ET tube is in place. Neck: Right neck incision is c/d/i. The trachea is midline. The thyroid is not significantly enlarged. Laryngeal framework palpable. Neuro: Intubated and on vent.  Recent Labs    12/20/19 0905 12/20/19 0905 12/20/19 1406 12/21/19 0617  WBC 12.2*  --   --  10.0  HGB 7.9*   < > 7.5* 7.8*  HCT 24.5*   < > 24.1* 24.3*  PLT 566*  --   --  523*   < > = values in this interval not displayed.   Recent Labs    12/20/19 1406 12/21/19 0617  NA 133* 132*  K 5.0 4.9  CL 95* 95*  CO2 25 24  GLUCOSE 118* 136*  BUN 98* 107*  CREATININE 2.29* 2.33*  CALCIUM 8.3* 8.4*    Medications:  I have reviewed the patient's current medications. Scheduled: . sodium chloride   Intravenous Once  . atorvastatin  80 mg Per Tube QHS  . bethanechol  10 mg Per Tube TID  . budesonide (PULMICORT) nebulizer solution  0.5 mg Nebulization BID  . chlorhexidine gluconate (MEDLINE KIT)  15 mL Mouth Rinse BID  . Chlorhexidine Gluconate Cloth  6 each Topical Q0600  . feeding supplement (PROSource TF)  45 mL Per Tube TID  . furosemide  40 mg Intravenous TID  . gabapentin  300 mg Per Tube BID  . heparin injection (subcutaneous)  5,000 Units Subcutaneous Q8H  . insulin aspart  0-15 Units Subcutaneous  Q4H  . insulin glargine  15 Units Subcutaneous Daily  . ipratropium-albuterol  3 mL Nebulization TID  . mouth rinse  15 mL Mouth Rinse 10 times per day  . multivitamin with minerals  1 tablet Per Tube Daily  . pantoprazole sodium  40 mg Per Tube QHS   Continuous: . sodium chloride Stopped (12/19/19 2039)  . albumin human Stopped (12/21/19 0906)  . dexmedetomidine (PRECEDEX) IV infusion 0.4 mcg/kg/hr (12/21/19 1100)  . feeding supplement (JEVITY 1.2 CAL) 1,000 mL (12/21/19 0827)  . fentaNYL infusion INTRAVENOUS 50 mcg/hr (12/21/19 1100)    Assessment/Plan: Pt with respiratory failure and ventilator dependence. - Plan for tracheostomy in the OR tomorrow morning at 8:30am. - The risks, benefits, alternatives, and details of the procedure are reviewed with the daughter.   LOS: 17 days   Kelly Williams 12/21/2019, 11:50 AM

## 2019-12-21 NOTE — Progress Notes (Signed)
  Progress Note    12/21/2019 8:03 AM 17 Days Post-Op  Subjective:  On vent, follows commands   Vitals:   12/21/19 0600 12/21/19 0700  BP: 119/73 129/68  Pulse: 80 77  Resp: 18 18  Temp:    SpO2: 97% 97%   Physical Exam: Cardiac:  Regular rate and rhythm Lungs: on vent, remains on minimal settings Incisions:  Left neck incision clean, dry and intact. No swelling or hematoma Extremities: moving all extremities without deficit Neurologic: neuro intact  CBC    Component Value Date/Time   WBC 10.0 12/21/2019 0617   RBC 2.55 (L) 12/21/2019 0617   HGB 7.8 (L) 12/21/2019 0617   HCT 24.3 (L) 12/21/2019 0617   PLT 523 (H) 12/21/2019 0617   MCV 95.3 12/21/2019 0617   MCH 30.6 12/21/2019 0617   MCHC 32.1 12/21/2019 0617   RDW 14.6 12/21/2019 0617   LYMPHSABS 3.4 11/26/2019 0004   MONOABS 0.9 11/26/2019 0004   EOSABS 0.3 11/26/2019 0004   BASOSABS 0.1 11/26/2019 0004    BMET    Component Value Date/Time   NA 132 (L) 12/21/2019 0617   K 4.9 12/21/2019 0617   CL 95 (L) 12/21/2019 0617   CO2 24 12/21/2019 0617   GLUCOSE 136 (H) 12/21/2019 0617   BUN 107 (H) 12/21/2019 0617   CREATININE 2.33 (H) 12/21/2019 0617   CREATININE 1.34 (H) 08/24/2019 1114   CALCIUM 8.4 (L) 12/21/2019 0617   GFRNONAA 26 (L) 12/21/2019 0617   GFRNONAA 50 (L) 08/24/2019 1114   GFRAA 30 (L) 12/21/2019 0617   GFRAA 58 (L) 08/24/2019 1114    INR    Component Value Date/Time   INR 1.2 12/17/2019 1807     Intake/Output Summary (Last 24 hours) at 12/21/2019 0803 Last data filed at 12/21/2019 0700 Gross per 24 hour  Intake 2032.62 ml  Output 832 ml  Net 1200.62 ml     Assessment/Plan:  79 y.o. male is s/p redo of right CEA 17 Days Post-Op complicated by dysphagia and pneumonia. Remains intubated on vent due to inability to manage secretions. Possible extubation trial vs trach. ASA and plavix on hold. Scr continues to trend up 2.33. Volume overloaded. Tolerating tube feedings. Hemoglobin  stable 7.8. Continue PT  DVT prophylaxis:  Sq heparin   Karoline Caldwell, PA-C [facility] [phone] 12/21/2019 8:03 AM

## 2019-12-22 ENCOUNTER — Inpatient Hospital Stay (HOSPITAL_COMMUNITY): Payer: Medicare HMO | Admitting: Certified Registered Nurse Anesthetist

## 2019-12-22 ENCOUNTER — Encounter (HOSPITAL_COMMUNITY): Payer: Self-pay | Admitting: Vascular Surgery

## 2019-12-22 ENCOUNTER — Encounter (HOSPITAL_COMMUNITY): Admission: RE | Disposition: E | Payer: Self-pay | Source: Home / Self Care | Attending: Vascular Surgery

## 2019-12-22 HISTORY — PX: TRACHEOSTOMY TUBE PLACEMENT: SHX814

## 2019-12-22 LAB — URINALYSIS, ROUTINE W REFLEX MICROSCOPIC
Bilirubin Urine: NEGATIVE
Glucose, UA: NEGATIVE mg/dL
Hgb urine dipstick: NEGATIVE
Ketones, ur: NEGATIVE mg/dL
Nitrite: NEGATIVE
Protein, ur: 30 mg/dL — AB
Specific Gravity, Urine: 1.015 (ref 1.005–1.030)
pH: 5 (ref 5.0–8.0)

## 2019-12-22 LAB — COMPREHENSIVE METABOLIC PANEL
ALT: 34 U/L (ref 0–44)
AST: 82 U/L — ABNORMAL HIGH (ref 15–41)
Albumin: 3 g/dL — ABNORMAL LOW (ref 3.5–5.0)
Alkaline Phosphatase: 340 U/L — ABNORMAL HIGH (ref 38–126)
Anion gap: 13 (ref 5–15)
BUN: 124 mg/dL — ABNORMAL HIGH (ref 8–23)
CO2: 25 mmol/L (ref 22–32)
Calcium: 8.6 mg/dL — ABNORMAL LOW (ref 8.9–10.3)
Chloride: 95 mmol/L — ABNORMAL LOW (ref 98–111)
Creatinine, Ser: 2.77 mg/dL — ABNORMAL HIGH (ref 0.61–1.24)
GFR calc Af Amer: 24 mL/min — ABNORMAL LOW (ref >60)
GFR calc non Af Amer: 21 mL/min — ABNORMAL LOW (ref >60)
Glucose, Bld: 154 mg/dL — ABNORMAL HIGH (ref 70–99)
Potassium: 4.8 mmol/L (ref 3.5–5.1)
Sodium: 133 mmol/L — ABNORMAL LOW (ref 135–145)
Total Bilirubin: 1.5 mg/dL — ABNORMAL HIGH (ref 0.3–1.2)
Total Protein: 6.5 g/dL (ref 6.5–8.1)

## 2019-12-22 LAB — PROTIME-INR
INR: 1.2 (ref 0.8–1.2)
Prothrombin Time: 15 seconds (ref 11.4–15.2)

## 2019-12-22 LAB — CBC
HCT: 23.4 % — ABNORMAL LOW (ref 39.0–52.0)
Hemoglobin: 7.6 g/dL — ABNORMAL LOW (ref 13.0–17.0)
MCH: 30.8 pg (ref 26.0–34.0)
MCHC: 32.5 g/dL (ref 30.0–36.0)
MCV: 94.7 fL (ref 80.0–100.0)
Platelets: 521 10*3/uL — ABNORMAL HIGH (ref 150–400)
RBC: 2.47 MIL/uL — ABNORMAL LOW (ref 4.22–5.81)
RDW: 14.8 % (ref 11.5–15.5)
WBC: 11.9 10*3/uL — ABNORMAL HIGH (ref 4.0–10.5)
nRBC: 0.2 % (ref 0.0–0.2)

## 2019-12-22 LAB — GLUCOSE, CAPILLARY
Glucose-Capillary: 140 mg/dL — ABNORMAL HIGH (ref 70–99)
Glucose-Capillary: 145 mg/dL — ABNORMAL HIGH (ref 70–99)
Glucose-Capillary: 153 mg/dL — ABNORMAL HIGH (ref 70–99)
Glucose-Capillary: 174 mg/dL — ABNORMAL HIGH (ref 70–99)
Glucose-Capillary: 176 mg/dL — ABNORMAL HIGH (ref 70–99)
Glucose-Capillary: 225 mg/dL — ABNORMAL HIGH (ref 70–99)

## 2019-12-22 LAB — IRON AND TIBC
Iron: 22 ug/dL — ABNORMAL LOW (ref 45–182)
Saturation Ratios: 12 % — ABNORMAL LOW (ref 17.9–39.5)
TIBC: 179 ug/dL — ABNORMAL LOW (ref 250–450)
UIBC: 157 ug/dL

## 2019-12-22 LAB — SODIUM, URINE, RANDOM: Sodium, Ur: <10 mmol/L

## 2019-12-22 LAB — FERRITIN: Ferritin: 325 ng/mL (ref 24–336)

## 2019-12-22 SURGERY — CREATION, TRACHEOSTOMY
Anesthesia: General | Site: Neck

## 2019-12-22 MED ORDER — PROPOFOL 10 MG/ML IV BOLUS
INTRAVENOUS | Status: DC | PRN
  Administered 2019-12-22: 100 mg via INTRAVENOUS
  Administered 2019-12-22: 50 mg via INTRAVENOUS

## 2019-12-22 MED ORDER — LIDOCAINE-EPINEPHRINE 1 %-1:100000 IJ SOLN
INTRAMUSCULAR | Status: AC
  Filled 2019-12-22: qty 1

## 2019-12-22 MED ORDER — METOLAZONE 5 MG PO TABS: 5.0000 mg | ORAL_TABLET | Freq: Once | ORAL | Status: DC

## 2019-12-22 MED ORDER — LIDOCAINE-EPINEPHRINE 1 %-1:100000 IJ SOLN
INTRAMUSCULAR | Status: DC | PRN
  Administered 2019-12-22: 3 mL

## 2019-12-22 MED ORDER — HEMOSTATIC AGENTS (NO CHARGE) OPTIME
TOPICAL | Status: DC | PRN
  Administered 2019-12-22: 1 via TOPICAL

## 2019-12-22 MED ORDER — MIDAZOLAM HCL 2 MG/2ML IJ SOLN
INTRAMUSCULAR | Status: AC
  Filled 2019-12-22: qty 2

## 2019-12-22 MED ORDER — CEFAZOLIN SODIUM-DEXTROSE 2-3 GM-%(50ML) IV SOLR
INTRAVENOUS | Status: DC | PRN
  Administered 2019-12-22: 2 g via INTRAVENOUS

## 2019-12-22 MED ORDER — LIDOCAINE 2% (20 MG/ML) 5 ML SYRINGE
INTRAMUSCULAR | Status: AC
  Filled 2019-12-22: qty 10

## 2019-12-22 MED ORDER — ROCURONIUM BROMIDE 10 MG/ML (PF) SYRINGE
PREFILLED_SYRINGE | INTRAVENOUS | Status: DC | PRN
  Administered 2019-12-22: 50 mg via INTRAVENOUS

## 2019-12-22 MED ORDER — FENTANYL CITRATE (PF) 250 MCG/5ML IJ SOLN
INTRAMUSCULAR | Status: AC
  Filled 2019-12-22: qty 5

## 2019-12-22 MED ORDER — PHENYLEPHRINE HCL (PRESSORS) 10 MG/ML IV SOLN
INTRAVENOUS | Status: DC | PRN
  Administered 2019-12-22: 80 ug via INTRAVENOUS
  Administered 2019-12-22: 40 ug via INTRAVENOUS
  Administered 2019-12-22: 120 ug via INTRAVENOUS
  Administered 2019-12-22: 80 ug via INTRAVENOUS

## 2019-12-22 MED ORDER — METOLAZONE 5 MG PO TABS
5.0000 mg | ORAL_TABLET | Freq: Once | ORAL | Status: AC
  Administered 2019-12-22: 5 mg
  Filled 2019-12-22: qty 1

## 2019-12-22 MED ORDER — LACTATED RINGERS IV SOLN: INTRAVENOUS | Status: DC | PRN

## 2019-12-22 MED ORDER — 0.9 % SODIUM CHLORIDE (POUR BTL) OPTIME
TOPICAL | Status: DC | PRN
  Administered 2019-12-22: 1000 mL

## 2019-12-22 MED ORDER — PHENYLEPHRINE 40 MCG/ML (10ML) SYRINGE FOR IV PUSH (FOR BLOOD PRESSURE SUPPORT)
PREFILLED_SYRINGE | INTRAVENOUS | Status: AC
  Filled 2019-12-22: qty 20

## 2019-12-22 MED ORDER — FUROSEMIDE 10 MG/ML IJ SOLN
160.0000 mg | Freq: Three times a day (TID) | INTRAVENOUS | Status: DC
  Administered 2019-12-22 – 2019-12-24 (×6): 160 mg via INTRAVENOUS
  Filled 2019-12-22 (×2): qty 16
  Filled 2019-12-22: qty 10
  Filled 2019-12-22: qty 16
  Filled 2019-12-22: qty 2
  Filled 2019-12-22: qty 4
  Filled 2019-12-22 (×3): qty 10

## 2019-12-22 MED ORDER — MIDAZOLAM HCL 5 MG/5ML IJ SOLN
INTRAMUSCULAR | Status: DC | PRN
  Administered 2019-12-22: 2 mg via INTRAVENOUS

## 2019-12-22 SURGICAL SUPPLY — 42 items
BLADE CLIPPER SURG (BLADE) IMPLANT
BLADE SURG 15 STRL LF DISP TIS (BLADE) IMPLANT
BLADE SURG 15 STRL SS (BLADE)
CANISTER SUCT 3000ML PPV (MISCELLANEOUS) ×3 IMPLANT
CLEANER TIP ELECTROSURG 2X2 (MISCELLANEOUS) ×3 IMPLANT
COVER SURGICAL LIGHT HANDLE (MISCELLANEOUS) ×3 IMPLANT
COVER WAND RF STERILE (DRAPES) ×3 IMPLANT
DECANTER SPIKE VIAL GLASS SM (MISCELLANEOUS) ×3 IMPLANT
DRAPE HALF SHEET 40X57 (DRAPES) IMPLANT
ELECT COATED BLADE 2.86 ST (ELECTRODE) ×6 IMPLANT
ELECT REM PT RETURN 9FT ADLT (ELECTROSURGICAL) ×3
ELECTRODE REM PT RTRN 9FT ADLT (ELECTROSURGICAL) ×1 IMPLANT
GAUZE 4X4 16PLY RFD (DISPOSABLE) IMPLANT
GAUZE XEROFORM 5X9 LF (GAUZE/BANDAGES/DRESSINGS) IMPLANT
GLOVE ECLIPSE 7.5 STRL STRAW (GLOVE) ×3 IMPLANT
GOWN STRL REUS W/ TWL LRG LVL3 (GOWN DISPOSABLE) ×3 IMPLANT
GOWN STRL REUS W/TWL LRG LVL3 (GOWN DISPOSABLE) ×6
HEMOSTAT SNOW SURGICEL 2X4 (HEMOSTASIS) ×3 IMPLANT
HOLDER TRACH TUBE VELCRO 19.5 (MISCELLANEOUS) IMPLANT
KIT BASIN OR (CUSTOM PROCEDURE TRAY) ×3 IMPLANT
KIT SUCTION CATH 14FR (SUCTIONS) IMPLANT
KIT TURNOVER KIT B (KITS) ×3 IMPLANT
NEEDLE HYPO 25GX1X1/2 BEV (NEEDLE) ×3 IMPLANT
NS IRRIG 1000ML POUR BTL (IV SOLUTION) ×3 IMPLANT
PAD ARMBOARD 7.5X6 YLW CONV (MISCELLANEOUS) ×6 IMPLANT
PENCIL SMOKE EVACUATOR (MISCELLANEOUS) IMPLANT
SPONGE INTESTINAL PEANUT (DISPOSABLE) ×3 IMPLANT
SUT CHROMIC 3 0 SH 27 (SUTURE) IMPLANT
SUT PROLENE 2 0 SH DA (SUTURE) ×6 IMPLANT
SUT SILK 3 0 (SUTURE) ×4
SUT SILK 3-0 18XBRD TIE 12 (SUTURE) ×2 IMPLANT
SYR 20ML LL LF (SYRINGE) IMPLANT
SYR BULB EAR ULCER 3OZ GRN STR (SYRINGE) IMPLANT
SYR CONTROL 10ML LL (SYRINGE) IMPLANT
TOWEL GREEN STERILE FF (TOWEL DISPOSABLE) ×3 IMPLANT
TRAY ENT MC OR (CUSTOM PROCEDURE TRAY) ×3 IMPLANT
TUBE CONNECTING 12'X1/4 (SUCTIONS) ×1
TUBE CONNECTING 12X1/4 (SUCTIONS) ×2 IMPLANT
TUBE TRACH  6.0 CUFF FLEX (MISCELLANEOUS) ×2
TUBE TRACH 6.0 CUFF FLEX (MISCELLANEOUS) ×1 IMPLANT
WATER STERILE IRR 1000ML POUR (IV SOLUTION) ×3 IMPLANT
YANKAUER SUCT BULB TIP NO VENT (SUCTIONS) ×3 IMPLANT

## 2019-12-22 NOTE — Transfer of Care (Signed)
Immediate Anesthesia Transfer of Care Note  Patient: Kelly Williams  Procedure(s) Performed: TRACHEOSTOMY (N/A Neck)  Patient Location: ICU  Anesthesia Type:General  Level of Consciousness: sedated and Patient remains intubated per anesthesia plan  Airway & Oxygen Therapy: Patient remains intubated per anesthesia plan and Patient placed on Ventilator (see vital sign flow sheet for setting)  Post-op Assessment: Report given to RN and Post -op Vital signs reviewed and stable  Post vital signs: Reviewed, stable  Last Vitals:  Vitals Value Taken Time  BP    Temp    Pulse    Resp    SpO2      Last Pain:  Vitals:   12/02/2019 0800  TempSrc: Axillary  PainSc:       Patients Stated Pain Goal: 3 (68/37/29 0211)  Complications: No complications documented.

## 2019-12-22 NOTE — Progress Notes (Signed)
NAME:  ROLLY MAGRI, MRN:  [id number], DOB:  1940-08-19, LOS: 9 ADMISSION DATE:  12/21/2019, CONSULTATION DATE:  12/12/2019 REFERRING MD:  Oneida Alar, CHIEF COMPLAINT:  Vent management for acute respiratory failure   Brief History   79 year old male with history of recurrent R ICA occlusion post-CEA who developed respoiratory failure requiring reintubation and pneumonia. He initially failed self extubation 8/5 after 24 hours off vent, and was again extubated 8/7 with seemingly good toleration. Then in the early am hours of 8/11, several hours after self-removal of Cortrak, he became unresponsive and hypoxemic requiring reintubation and transfer to ICU.  Patient is awaiting trach placement.  Past Medical History   Past Medical History:  Diagnosis Date  . Alcohol abuse   . Alcohol abuse   . Arthritis   . Carotid arterial disease ([place])   . Chronic kidney disease    CKD-patient denies  . COPD (chronic obstructive pulmonary disease) (HCC)    uses inhalers  . Coronary artery disease   . Diabetes mellitus without complication ([place])   . Diabetic retinopathy ([place])   . Hyperlipidemia   . Hypertension   . Normocytic anemia 01/16/2019  . Stroke [facility])    around 2020, 2 after bypass in 2015   Significant Hospital Events   8/3: admitted to [facility], CEA, PCCM consulted for vent management 8/4: self extubated 8/5: Re-intubated overnight 8/7: Extubated 8/11: Reintubation   Consults:  Neurology   Procedures:  8/3: ETT 8/3: R CEA 8/3: Exploration of R CEA and R ICA thrombectomy 8/3: R art line   Significant Diagnostic Tests:  Carotid duplex 8/3 > right ICA occlusion CTA Head/Neck 8/3 > No acute intracranial abnormality. Post repeat right carotid endarterectomy. Decreased irregularity of the proximal cervical ICA. 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 Persistent narrowing and irregularity of the distal ICA with increased eccentric intraluminal  filling defect, which may reflect unstable/migrated clot. 60-70% stenosis of the proximal left ICA. No proximal intracranial vessel occlusion. Stable findings of multifocal intracranial atherosclerosis. MRI Brain wo Contrast 8/3 > 1. Unchanged appearance of acute right MCA territory infarct. No new site of acute ischemia. 2. Unchanged generalized atrophy and findings of chronic ischemic Microangiopathy. MR Brain wo Contrast 8/6 > Unchanged appearance of a subacute right MCA territory infarct centered within the right frontal operculum as compared to the MRI of 12/21/2019. No significant mass effect or midline shift. Expected evolution of a known subacute infarct within the right parietal lobe. No interval acute infarct is demonstrated. Stable background generalized parenchymal atrophy and chronic ischemic changes with multiple chronic infarcts as detailed. CT Head wo Contrast 8/11 > 1. Stable appearance of subacute and chronic right MCA infarcts. 2. Stable remote infarcts of the high right frontal lobe and left basal ganglia. 3. No acute intracranial abnormality or significant interval change.  Micro Data:  MRSA 8/3 Negative Respiratory Cx 8/5 > Negative  Blood Cx 8/11 > Negative  Respiratory Cx 8/13 > pansensitive pseudomonas MRSA 8/16 Negative  Antimicrobials:  Cefazolin 8/3 Unasyn 8/5 > 8/12 Zosyn 8/12> 8/16 Ceftazidime 8/16> 8/17  Interim history/subjective:   Elderly male, planned for trach in operating room.  Awake alert remains on mechanical support  Objective   Blood pressure 102/62, pulse (!) 55, temperature (!) 97.2 F (36.2 C), temperature source Axillary, resp. rate 18, height 5\' 6"  (1.676 m), weight 77 kg, SpO2 96 %.    Vent Mode: PRVC FiO2 (%):  [30 %-40 %] 30 %  Set Rate:  [18 bmp] 18 bmp Vt Set:  [510 mL] 510 mL PEEP:  [5 cmH20] 5 cmH20 Pressure Support:  [8 cmH20] 8 cmH20 Plateau Pressure:  [23 cmH20-28 cmH20] 27 cmH20   Intake/Output Summary (Last 24 hours) at  12/08/2019 0736 Last data filed at 12/05/2019 7262 Gross per 24 hour  Intake 1435.75 ml  Output 425 ml  Net 1010.75 ml   Filed Weights   12/20/19 0500 12/21/19 0500 12/14/2019 0500  Weight: 75.5 kg 79.2 kg 77 kg   Examination: General: Elderly frail male intubated on mechanical life support seen prior to transport to the OR for tracheostomy tube HENT: NCAT, ETT in place Lungs: Bilateral mechanically ventilated breath sounds Cardiovascular: Regular rate rhythm, S1-S2 Abdomen: Nontender nondistended bowel sounds present Extremities: No significant edema Neuro: Alert oriented following commands no focal deficit  Assessment & Plan:   Acute Respiratory failure s/p multiple failed extubations  -OR for tracheostomy tube today. -Once back from the OR can keep off sedation -Attempt pressure support trial soon as possible, early transition to TCT.  Bilateral pleural effusions and acute pulmonary edema Currently tolerating pressure support; CXR this morning with worsening pulmonary edema despite lasix and albumin challenge.  -Continue diuresis -Appreciate input from nephrology.  AKI on CKD:  Hyponatremia Continue diuresis per nephrology.  Elevated LFTs: Alkaline phosphatase elevated. RUQ [place] without any biliary duct dilation. Suspect intrahepatic cholestasis. Continue to monitor  Normocytic anemia:  Received 1u pRBC on 8/15 for acute drop in Hb. Hb stable ~8 at this time. Did have bowel movement this morning. No melena or bright red blood noted.  -Trend CBC -Continue to follow, conservative transfusion threshold for hemoglobin less than 7  R CEA revision:  -Postop care per vascular surgery  Best practice:  Diet: tube feeds  Pain/Anxiety/Delirium protocol (if indicated): per protocol VAP protocol (if indicated): per protocol  DVT prophylaxis: SCDs GI prophylaxis: PPI Glucose control: Lantus + SSI Mobility: Bed rest Code Status: FULL Family Communication: Patient's daughter,  Abhiraj Dozal, updated via telephone  Disposition: ICU  This patient is critically ill with multiple organ system failure; which, requires frequent high complexity decision making, assessment, support, evaluation, and titration of therapies. This was completed through the application of advanced monitoring technologies and extensive interpretation of multiple databases. During this encounter critical care time was devoted to patient care services described in this note for 32 minutes.  Garner Nash, DO [facility] 12/21/2019 7:36 AM

## 2019-12-22 NOTE — Op Note (Signed)
DATE OF PROCEDURE:  12/17/2019                              OPERATIVE REPORT  SURGEON:  Leta Baptist, MD  PREOPERATIVE DIAGNOSES: 1. Respiratory failure. 2. Ventilator dependence  POSTOPERATIVE DIAGNOSES: 1. Respiratory failure. 2. Ventilator dependence  PROCEDURE PERFORMED:  Tracheostomy  ANESTHESIA:  General endotracheal tube anesthesia.  COMPLICATIONS:  None.  ESTIMATED BLOOD LOSS:  Minimal.  INDICATION FOR PROCEDURE:  Kelly Williams is a 79 y.o. male with a history of respiratory failure and ventilator dependence. He failed multiple extubation attempts. The decision was therefore made to proceed with tracheostomy tube placement. Likelihood of success in ventilator weaning was discussed with the daughter.  The risks, benefits, alternatives, and details of the procedure were discussed.  Questions were invited and answered.  Informed consent was obtained.  DESCRIPTION:  The patient was taken to the operating room and placed supine on the operating table. General anesthesia was administered via the pre-existing endotracheal tube. The patient was positioned and prepped and draped in the standard fashion for tracheostomy tube placement. 1% lidocaine with 1-100,000 epinephrine was injected at the anterior neck. A 2 cm vertical incision was made in the anterior necks, at the level slightly below the cricoid bone. The incision was carried down past the level of the platysma muscle. The strep muscles were identified and divided in midline. They were retracted laterally, exposing the thyroid gland. The thyroid was divided at midline and retracted laterally. The anterior tracheal wall was exposed. A tracheal window was made at the level of the second tracheal ring. The endotracheal tube was withdrawn. A # 6 cuffed Shiley tracheostomy tube was placed without difficulty. Good end tidal volume and CO2 return was noted. The tracheostomy tube was secured in place with 2-0 Prolene sutures and circumferential  necktie. The care of the patient was transferred to the anesthesiologist. The patient was awakened from anesthesia without difficulty. He was transferred back to the intensive care unit in stable condition.  OPERATIVE FINDINGS:  A # 6 cuffed Shiley tracheostomy tube was placed without difficulty.  SPECIMEN:  None.  FOLLOWUP CARE:  The patient will return to the ICU.   Kelly Williams 12/13/2019 10:21 AM

## 2019-12-22 NOTE — Anesthesia Postprocedure Evaluation (Signed)
Anesthesia Post Note  Patient: Kelly Williams  Procedure(s) Performed: TRACHEOSTOMY (N/A Neck)     Patient location during evaluation: SICU Anesthesia Type: General Level of consciousness: sedated Pain management: pain level controlled Vital Signs Assessment: post-procedure vital signs reviewed and stable Respiratory status: patient remains intubated per anesthesia plan Cardiovascular status: stable Postop Assessment: no apparent nausea or vomiting Anesthetic complications: no   No complications documented.  Last Vitals:  Vitals:   12/10/2019 0800 12/10/2019 1035  BP: (!) 108/53 (!) 103/46  Pulse: (!) 58 77  Resp: 18 18  Temp: (!) 36.2 C   SpO2: 97% 96%    Last Pain:  Vitals:   12/19/2019 0800  TempSrc: Axillary  PainSc:                  Aisa Schoeppner,W. EDMOND

## 2019-12-22 NOTE — Progress Notes (Signed)
Nephrology Follow-Up Consult note   Assessment/Recommendations: Kelly Williams is a/an 79 y.o. male with a past medical history significant for alcohol use disorder, PAD, COPD CAD, DM 2, HLD, HTN, CVA who present w/ endarterectomy revision complicated by ventilatory dependence and AKI  Severe nonoliguric AKI on CKD G3:  Fluctuating course. Attempted hydration in the past due to low urine sodium but creatinine continue to worsen and respiratory status worsen. More consistent with cardiorenal syndrome. Tried IV Lasix with rising creatinine but he did not have significant urine output so cardiorenal syndrome still very possible. Despite his low urine sodium is also possible he has ATN but unclear what from. We will try to examine his urine sediment today to look for muddy brown casts. We will continue with aggressive diuretics at this time. -Increase Lasix to 160 mg 3 times daily -Start metolazone 5 mg daily -Monitor renal function panel twice daily -UPC minimally elevated as expected with AKI -We will try to examine urine sediment today -Dose meds for GFR -Monitor Daily I/Os, Daily weight  -Maintain MAP>65 for optimal renal perfusion.  -Avoid nephrotoxic medications including NSAIDs and Vanc/Zosyn combo -Renal ultrasound on 8/18 without obstruction -Currently no indication for HD  Volume Status: Appears volume overloaded on exam.  Based on her examination and review of available data we recommend diuretics as above.  Hypertension: Moderately well controlled at this time.  Diuresis as above.  Continue metoprolol.   Hypoxic respiratory failure/ventilatory dependence: Management per primary team. Tracheostomy today  Anemia: Multifactorial kidney disease contributing minimally.  Continue to monitor. Ferritin and iron panel today  Hyponatremia: Sodium 133.   Stable. Likely related to volume overload. Treatment as above Hyperkalemia: Resolved. Potassium 4.8 today   Recommendations  conveyed to primary service.    [facility] 12/26/2019 11:00 AM  ___________________________________________________________  CC: AKI  Interval History/Subjective: Patient remains critically ill over the past 24 hours. Despite IV Lasix patient is only made 400 cc of urine over the past 24 hours. Creatinine continues to worsen. Patient is going for tracheostomy today. He is more awake and alert today.   Medications:  Current Facility-Administered Medications  Medication Dose Route Frequency Provider Last Rate Last Admin  . 0.9 %  sodium chloride infusion (Manually program via Guardrails IV Fluids)   Intravenous Once Anders Simmonds, MD      . 0.9 %  sodium chloride infusion   Intravenous PRN Omar Person, NP   Stopped at 12/19/19 2039  . acetaminophen (TYLENOL) tablet 325-650 mg  325-650 mg Per Tube Q4H PRN Elam Dutch, MD   650 mg at 12/16/19 1148   Or  . acetaminophen (TYLENOL) suppository 325-650 mg  325-650 mg Rectal Q4H PRN Elam Dutch, MD      . atorvastatin (LIPITOR) tablet 80 mg  80 mg Per Tube QHS Elam Dutch, MD   80 mg at 12/21/19 2237  . bethanechol (URECHOLINE) tablet 10 mg  10 mg Per Tube TID Corey Harold, NP   10 mg at 12/21/19 2237  . budesonide (PULMICORT) nebulizer solution 0.5 mg  0.5 mg Nebulization BID Rigoberto Noel, MD   0.5 mg at 12/09/2019 0806  . chlorhexidine gluconate (MEDLINE KIT) (PERIDEX) 0.12 % solution 15 mL  15 mL Mouth Rinse BID Elam Dutch, MD   15 mL at 12/21/19 1957  . Chlorhexidine Gluconate Cloth 2 % PADS 6 each  6 each Topical Q0600 Elam Dutch, MD   6  each at 12/28/2019 0646  . dexmedetomidine (PRECEDEX) 400 MCG/100ML (4 mcg/mL) infusion  0.4-1.2 mcg/kg/hr Intravenous Titrated Trevor Mace U, MD 7.78 mL/hr at 12/28/2019 0400 0.5 mcg/kg/hr at 12/16/2019 0400  . docusate (COLACE) 50 MG/5ML liquid 100 mg  100 mg Per Tube BID PRN Maudie Mercury, MD      . feeding supplement (JEVITY  1.2 CAL) liquid 1,000 mL  1,000 mL Per Tube Continuous Elam Dutch, MD 50 mL/hr at 12/13/2019 0000 1,000 mL at 12/12/2019 0000  . feeding supplement (PROSource TF) liquid 45 mL  45 mL Per Tube TID Elam Dutch, MD   45 mL at 12/21/19 2237  . fentaNYL (SUBLIMAZE) injection 25 mcg  25 mcg Intravenous Q15 min PRN Collene Gobble, MD   25 mcg at 12/16/19 2221  . fentaNYL (SUBLIMAZE) injection 25-100 mcg  25-100 mcg Intravenous Q30 min PRN Collene Gobble, MD   50 mcg at 12/19/19 0404  . fentaNYL 2576mg in NS 2543m(1070mml) infusion-PREMIX  0-400 mcg/hr Intravenous Continuous Ogan, Okoronkwo U, MD 5 mL/hr at 12/18/2019 0400 50 mcg/hr at 12/20/2019 0400  . furosemide (LASIX) 160 mg in dextrose 5 % 50 mL IVPB  160 mg Intravenous Q8H PeeReesa ChewD      . heparin injection 5,000 Units  5,000 Units Subcutaneous Q8H DanJohnnette Gourd RPH   5,000 Units at 12/27/2019 [id number] hydrALAZINE (APRESOLINE) injection 10-20 mg  10-20 mg Intravenous Q4H PRN HofCorey HaroldP   20 mg at 12/21/19 0902  . insulin aspart (novoLOG) injection 0-15 Units  0-15 Units Subcutaneous Q4H OgaFrederik PearD   2 Units at 12/15/2019 0452  . insulin glargine (LANTUS) injection 15 Units  15 Units Subcutaneous Daily CriJamesetta SoD   15 Units at 12/21/19 0902  . ipratropium-albuterol (DUONEB) 0.5-2.5 (3) MG/3ML nebulizer solution 3 mL  3 mL Nebulization Q6H PRN EveDagoberto LigasA-C   3 mL at 12/11/19 0522  . ipratropium-albuterol (DUONEB) 0.5-2.5 (3) MG/3ML nebulizer solution 3 mL  3 mL Nebulization TID FieElam DutchD   3 mL at 12/10/2019 0806  . loperamide HCl (IMODIUM) 1 MG/7.5ML suspension 2 mg  2 mg Per Tube PRN HarClydell HakimD      . MEDLINE mouth rinse  15 mL Mouth Rinse 10 times per day FieElam DutchD   15 mL at 12/26/2019 0644  . metolazone (ZAROXOLYN) tablet 5 mg  5 mg Oral Once PeeReesa ChewD      . metoprolol tartrate (LOPRESSOR) injection 2-5 mg  2-5 mg Intravenous Q2H PRN EveDagoberto LigasA-C       . multivitamin with minerals tablet 1 tablet  1 tablet Per Tube Daily AgaKipp BroodD   1 tablet at 12/21/19 0902  . pantoprazole sodium (PROTONIX) 40 mg/20 mL oral suspension 40 mg  40 mg Per Tube QHS FieElam DutchD   40 mg at 12/21/19 2237  . phenol (CHLORASEPTIC) mouth spray 1 spray  1 spray Mouth/Throat PRN EveDagoberto LigasA-C      . polyethylene glycol (MIRALAX / GLYCOLAX) packet 17 g  17 g Per Tube Daily PRN WinMaudie MercuryD      . potassium chloride SA (KLOR-CON) CR tablet 20-40 mEq  20-40 mEq Oral Daily PRN EveDagoberto LigasA-C          Review of Systems: Unable to perform review of systems due to the patient's altered mental status  Physical Exam: Vitals:  12/06/2019 0800 12/12/2019 1035  BP: (!) 108/53 (!) 103/46  Pulse: (!) 58 77  Resp: 18 18  Temp: (!) 97.1 F (36.2 C)   SpO2: 97% 96%   Total I/O In: 711.1 [I.V.:651.1; IV Piggyback:60] Out: 20 [Blood:20]  Intake/Output Summary (Last 24 hours) at 12/08/2019 1100 Last data filed at 12/23/2019 1018 Gross per 24 hour  Intake 1833.78 ml  Output 445 ml  Net 1388.78 ml   Constitutional: Ill-appearing, no acute distress ENMT: ears and nose without scars or lesions, MMM, ET tube in mouth CV: normal rate, 2+ pitting edema in all four extremities Respiratory: Coarse bilateral breath sounds, bilateral chest rise Gastrointestinal: soft, non-tender, no palpable masses or hernias Skin: no visible lesions or rashes Psych: Awake, alert, unable to answer questions   Test Results I personally reviewed new and old clinical labs and radiology tests Lab Results  Component Value Date   NA 133 (L) 12/10/2019   K 4.8 12/21/2019   CL 95 (L) 12/15/2019   CO2 25 12/15/2019   BUN 124 (H) 12/20/2019   CREATININE 2.77 (H) 12/21/2019   CALCIUM 8.6 (L) 12/18/2019   ALBUMIN 3.0 (L) 12/05/2019   PHOS 3.5 12/12/2019

## 2019-12-22 NOTE — Progress Notes (Signed)
   VASCULAR SURGERY ASSESSMENT & PLAN:   S/P REDO RIGHT CEA: His postoperative course was complicated by aspiration.  He is for tracheostomy today.  He is following commands.  More alert than yesterday.  Restart aspirin and Plavix after tracheostomy, once ENT feels that it is safe from their standpoint.  CKD: His creatinine is up slightly.  SUBJECTIVE:   On vent.  Follows commands.  PHYSICAL EXAM:   Vitals:   12/13/2019 0300 12/03/2019 0400 12/24/2019 0447 12/30/2019 0500  BP: 108/60 102/62    Pulse: 66 70 (!) 55   Resp: _0 Temp:  (!) 97.2 F (36.2 C)    TempSrc:  Axillary    SpO2: 97% 97% 96%   Weight:    77 kg  Height:       Slightly weaker in the left upper extremity compared to the right. His incision looks fine.  LABS:   Lab Results  Component Value Date   WBC 11.9 (H) 12/15/2019   HGB 7.6 (L) 12/17/2019   HCT 23.4 (L) 12/24/2019   MCV 94.7 12/21/2019   PLT 521 (H) 12/26/2019   Lab Results  Component Value Date   CREATININE 2.77 (H) 12/09/2019   Lab Results  Component Value Date   INR 1.2 12/13/2019   CBG (last 3)  Recent Labs    12/21/19 1955 12/03/2019 0030 12/20/2019 0451  GLUCAP 203* 174* 140*    PROBLEM LIST:    Active Problems:   COPD with chronic bronchitis and emphysema (HCC)   Chronic kidney disease   Carotid artery stenosis   Endotracheal tube present   Encephalopathy acute   Acute hypoxemic respiratory failure (HCC)   Protein-calorie malnutrition, severe   Aspiration into airway   Acute respiratory distress   CURRENT MEDS:   . sodium chloride   Intravenous Once  . atorvastatin  80 mg Per Tube QHS  . bethanechol  10 mg Per Tube TID  . budesonide (PULMICORT) nebulizer solution  0.5 mg Nebulization BID  . chlorhexidine gluconate (MEDLINE KIT)  15 mL Mouth Rinse BID  . Chlorhexidine Gluconate Cloth  6 each Topical Q0600  . feeding supplement (PROSource TF)  45 mL Per Tube TID  . heparin injection (subcutaneous)  5,000 Units  Subcutaneous Q8H  . insulin aspart  0-15 Units Subcutaneous Q4H  . insulin glargine  15 Units Subcutaneous Daily  . ipratropium-albuterol  3 mL Nebulization TID  . mouth rinse  15 mL Mouth Rinse 10 times per day  . multivitamin with minerals  1 tablet Per Tube Daily  . pantoprazole sodium  40 mg Per Tube QHS    Deitra Mayo Office: [phone] 12/23/2019

## 2019-12-22 NOTE — Anesthesia Preprocedure Evaluation (Addendum)
Anesthesia Evaluation  Patient identified by MRN, date of birth, ID bandGeneral Assessment Comment:Sedated on vent  Reviewed: Allergy & Precautions, H&P , NPO status , Patient's Chart, lab work & pertinent test results  Airway Mallampati: Intubated       Dental no notable dental hx. (+) Teeth Intact, Dental Advisory Given   Pulmonary COPD, former smoker,  VDRF   Pulmonary exam normal breath sounds clear to auscultation       Cardiovascular hypertension, Pt. on medications + CAD and + CABG   Rhythm:Regular Rate:Normal     Neuro/Psych CVA negative psych ROS   GI/Hepatic negative GI ROS, Neg liver ROS,   Endo/Other  diabetes, Type 2, Oral Hypoglycemic Agents  Renal/GU Renal InsufficiencyRenal disease  negative genitourinary   Musculoskeletal  (+) Arthritis , Osteoarthritis,    Abdominal   Peds  Hematology  (+) Blood dyscrasia, anemia ,   Anesthesia Other Findings   Reproductive/Obstetrics negative OB ROS                             Anesthesia Physical Anesthesia Plan  ASA: IV  Anesthesia Plan: General   Post-op Pain Management:    Induction: Intravenous  PONV Risk Score and Plan: 2 and Ondansetron and Treatment may vary due to age or medical condition  Airway Management Planned: Tracheostomy  Additional Equipment:   Intra-op Plan:   Post-operative Plan: Post-operative intubation/ventilation  Informed Consent: I have reviewed the patients History and Physical, chart, labs and discussed the procedure including the risks, benefits and alternatives for the proposed anesthesia with the patient or authorized representative who has indicated his/her understanding and acceptance.     Dental advisory given  Plan Discussed with: CRNA  Anesthesia Plan Comments:         Anesthesia Quick Evaluation

## 2019-12-23 ENCOUNTER — Inpatient Hospital Stay (HOSPITAL_COMMUNITY): Payer: Medicare HMO

## 2019-12-23 LAB — GLUCOSE, CAPILLARY
Glucose-Capillary: 214 mg/dL — ABNORMAL HIGH (ref 70–99)
Glucose-Capillary: 231 mg/dL — ABNORMAL HIGH (ref 70–99)
Glucose-Capillary: 239 mg/dL — ABNORMAL HIGH (ref 70–99)
Glucose-Capillary: 224 mg/dL — ABNORMAL HIGH (ref 70–99)
Glucose-Capillary: 206 mg/dL — ABNORMAL HIGH (ref 70–99)

## 2019-12-23 LAB — RENAL FUNCTION PANEL
Albumin: 2.8 g/dL — ABNORMAL LOW (ref 3.5–5.0)
Anion gap: 15 (ref 5–15)
BUN: 134 mg/dL — ABNORMAL HIGH (ref 8–23)
CO2: 24 mmol/L (ref 22–32)
Calcium: 8.5 mg/dL — ABNORMAL LOW (ref 8.9–10.3)
Chloride: 94 mmol/L — ABNORMAL LOW (ref 98–111)
Creatinine, Ser: 2.95 mg/dL — ABNORMAL HIGH (ref 0.61–1.24)
GFR calc Af Amer: 22 mL/min — ABNORMAL LOW (ref >60)
GFR calc non Af Amer: 19 mL/min — ABNORMAL LOW (ref >60)
Glucose, Bld: 198 mg/dL — ABNORMAL HIGH (ref 70–99)
Phosphorus: 7.7 mg/dL — ABNORMAL HIGH (ref 2.5–4.6)
Potassium: 4.8 mmol/L (ref 3.5–5.1)
Sodium: 133 mmol/L — ABNORMAL LOW (ref 135–145)

## 2019-12-23 MED ORDER — METOLAZONE 5 MG PO TABS
5.0000 mg | ORAL_TABLET | Freq: Once | ORAL | Status: DC
  Filled 2019-12-23: qty 1

## 2019-12-23 MED ORDER — SODIUM CHLORIDE 0.9 % IV SOLN
510.0000 mg | INTRAVENOUS | Status: AC
  Administered 2019-12-23 – 2019-12-30 (×2): 510 mg via INTRAVENOUS
  Filled 2019-12-23 (×3): qty 17

## 2019-12-23 MED ORDER — METOLAZONE 5 MG PO TABS
5.0000 mg | ORAL_TABLET | Freq: Once | ORAL | Status: AC
  Administered 2019-12-23: 5 mg
  Filled 2019-12-23: qty 1

## 2019-12-23 MED ORDER — ONDANSETRON HCL 4 MG/2ML IJ SOLN
INTRAMUSCULAR | Status: AC
  Administered 2019-12-23: 4 mg
  Filled 2019-12-23: qty 2

## 2019-12-23 MED ORDER — ONDANSETRON HCL 4 MG/2ML IJ SOLN
4.0000 mg | Freq: Three times a day (TID) | INTRAMUSCULAR | Status: DC | PRN
  Administered 2019-12-23: 4 mg via INTRAVENOUS
  Filled 2019-12-23: qty 2

## 2019-12-23 MED ORDER — METOLAZONE 5 MG PO TABS
5.0000 mg | ORAL_TABLET | ORAL | Status: DC
  Administered 2019-12-23 – 2019-12-25 (×4): 5 mg
  Filled 2019-12-23 (×4): qty 1

## 2019-12-23 NOTE — Progress Notes (Signed)
   VASCULAR SURGERY ASSESSMENT & PLAN:   S/P REDO RIGHT CEA: Neurologic exam is stable.  POD1 S/P TRACH: Appreciate Dr. Deeann Saint help   SUBJECTIVE:   On vent.  Follows commands.  PHYSICAL EXAM:   Vitals:   12/23/19 0343 12/23/19 0400 12/23/19 0500 12/23/19 0600  BP:  (!) 147/58 129/71 (!) 145/68  Pulse: 94 90 94 87  Resp: _0 Temp:      TempSrc:      SpO2: 100% 100% 100% 99%  Weight:      Height:       Moving all extremities.  Slight weakness on the left arm which is baseline.  LABS:   Lab Results  Component Value Date   WBC 11.9 (H) 12/11/2019   HGB 7.6 (L) 12/21/2019   HCT 23.4 (L) 12/18/2019   MCV 94.7 12/19/2019   PLT 521 (H) 12/16/2019   Lab Results  Component Value Date   CREATININE 2.95 (H) 12/23/2019   Lab Results  Component Value Date   INR 1.2 12/20/2019   CBG (last 3)  Recent Labs    12/08/2019 1535 12/07/2019 2308 12/23/19 0330  GLUCAP 176* 225* 214*    PROBLEM LIST:    Active Problems:   COPD with chronic bronchitis and emphysema (HCC)   Chronic kidney disease   Carotid artery stenosis   Endotracheal tube present   Encephalopathy acute   Acute hypoxemic respiratory failure (HCC)   Protein-calorie malnutrition, severe   Aspiration into airway   Acute respiratory distress   CURRENT MEDS:   . sodium chloride   Intravenous Once  . atorvastatin  80 mg Per Tube QHS  . bethanechol  10 mg Per Tube TID  . budesonide (PULMICORT) nebulizer solution  0.5 mg Nebulization BID  . chlorhexidine gluconate (MEDLINE KIT)  15 mL Mouth Rinse BID  . Chlorhexidine Gluconate Cloth  6 each Topical Q0600  . feeding supplement (PROSource TF)  45 mL Per Tube TID  . heparin injection (subcutaneous)  5,000 Units Subcutaneous Q8H  . insulin aspart  0-15 Units Subcutaneous Q4H  . insulin glargine  15 Units Subcutaneous Daily  . ipratropium-albuterol  3 mL Nebulization TID  . mouth rinse  15 mL Mouth Rinse 10 times per day  . metolazone  5 mg Per Tube  2 times per day  . metolazone  5 mg Per Tube Once  . multivitamin with minerals  1 tablet Per Tube Daily  . pantoprazole sodium  40 mg Per Tube QHS    Deitra Mayo Office: [phone] 12/23/2019

## 2019-12-23 NOTE — Progress Notes (Signed)
Nephrology Follow-Up Consult note   Assessment/Recommendations: Kelly Williams is a/an 79 y.o. male with a past medical history significant for alcohol use disorder, PAD, COPD CAD, DM 2, HLD, HTN, CVA who present w/ endarterectomy revision complicated by ventilatory dependence and AKI  Severe nonoliguric AKI on CKD G3:  Fluctuating course. Attempted hydration in the past due to low urine sodium but creatinine continue to worsen and respiratory status worsen. More consistent with cardiorenal syndrome.  Repeat urine testing showed urine sodium is still low (despite high-dose furosemide) which continues to be consistent with cardiorenal syndrome.  Urine output somewhat improved but still not net negative.  BUN rising at this time.  We will max out diuretics today.  If the patient fails to improve or be net negative for 24 hours may need to consider dialysis.  -Continue Lasix 160 mg 3 times daily -Increase metolazone to 5 mg twice daily -No obvious signs of ATN on urine sediment -Dose meds for GFR -Monitor Daily I/Os, Daily weight  -Maintain MAP>65 for optimal renal perfusion.  -Avoid nephrotoxic medications including NSAIDs and Vanc/Zosyn combo -Renal ultrasound on 8/18 without obstruction -Consider hemodialysis if patient fails to improve  Volume Status: Appears volume overloaded on exam.  Based on his examination and review of available data we recommend diuretics as above.  Hypertension: Diuresis as above.  Continue metoprolol.  Hypoxic respiratory failure/ventilatory dependence: Management per primary team. Tracheostomy today  Anemia: Multifactorial kidney disease contributing minimally.  Continue to monitor.  Ferritin 325.  Iron saturation of 12.  Feraheme x2 doses  Hyponatremia: Sodium 133.   Stable. Likely related to volume overload. Treatment as above Hyperkalemia: Resolved. Potassium 4.8 today   Recommendations conveyed to primary service.    [facility] 12/23/2019 10:45 AM  ___________________________________________________________  CC: AKI  Interval History/Subjective: Remains critically ill on the ventilator.  Blood pressure acceptable.  Urine output improved but continues to have difficulty being net negative.    Medications:  Current Facility-Administered Medications  Medication Dose Route Frequency Provider Last Rate Last Admin  . 0.9 %  sodium chloride infusion (Manually program via Guardrails IV Fluids)   Intravenous Once Anders Simmonds, MD      . 0.9 %  sodium chloride infusion   Intravenous PRN Omar Person, NP 10 mL/hr at 12/23/19 0916 New Bag at 12/23/19 0916  . acetaminophen (TYLENOL) tablet 325-650 mg  325-650 mg Per Tube Q4H PRN Elam Dutch, MD   650 mg at 12/23/19 1594   Or  . acetaminophen (TYLENOL) suppository 325-650 mg  325-650 mg Rectal Q4H PRN Elam Dutch, MD      . atorvastatin (LIPITOR) tablet 80 mg  80 mg Per Tube QHS Elam Dutch, MD   80 mg at 12/02/2019 2244  . bethanechol (URECHOLINE) tablet 10 mg  10 mg Per Tube TID Corey Harold, NP   10 mg at 12/23/19 0908  . budesonide (PULMICORT) nebulizer solution 0.5 mg  0.5 mg Nebulization BID Rigoberto Noel, MD   0.5 mg at 12/23/19 0756  . chlorhexidine gluconate (MEDLINE KIT) (PERIDEX) 0.12 % solution 15 mL  15 mL Mouth Rinse BID Elam Dutch, MD   15 mL at 12/23/19 0800  . Chlorhexidine Gluconate Cloth 2 % PADS 6 each  6 each Topical Q0600 Elam Dutch, MD   6 each at 12/12/2019 [phone]  . dexmedetomidine (PRECEDEX) 400 MCG/100ML (4 mcg/mL) infusion  0.4-1.2 mcg/kg/hr Intravenous Titrated Ogan, Okoronkwo U,  MD   Stopped at 12/05/2019 0935  . docusate (COLACE) 50 MG/5ML liquid 100 mg  100 mg Per Tube BID PRN Maudie Mercury, MD      . feeding supplement (JEVITY 1.2 CAL) liquid 1,000 mL  1,000 mL Per Tube Continuous Elam Dutch, MD 50 mL/hr at 12/23/19 0909 1,000 mL at 12/23/19 0909  . feeding supplement (PROSource  TF) liquid 45 mL  45 mL Per Tube TID Elam Dutch, MD   45 mL at 12/23/19 0908  . fentaNYL (SUBLIMAZE) injection 25 mcg  25 mcg Intravenous Q15 min PRN Collene Gobble, MD   25 mcg at 12/16/19 2221  . fentaNYL (SUBLIMAZE) injection 25-100 mcg  25-100 mcg Intravenous Q30 min PRN Collene Gobble, MD   100 mcg at 12/02/2019 1705  . fentaNYL 2531mg in NS 2525m(1040mml) infusion-PREMIX  0-400 mcg/hr Intravenous Continuous OgaFrederik PearD   Paused at 12/27/2019 [id number] ferumoxytol (FERAHEME) 510 mg in sodium chloride 0.9 % 100 mL IVPB  510 mg Intravenous Weekly PeeReesa ChewD 468 mL/hr at 12/23/19 0917 510 mg at 12/23/19 0917  . furosemide (LASIX) 160 mg in dextrose 5 % 50 mL IVPB  160 mg Intravenous Q8H PeeReesa ChewD 66 mL/hr at 12/23/19 0600 Rate Verify at 12/23/19 0600  . heparin injection 5,000 Units  5,000 Units Subcutaneous Q8H DanTyrone ApplePH   5,000 Units at 12/23/19 [id number] hydrALAZINE (APRESOLINE) injection 10-20 mg  10-20 mg Intravenous Q4H PRN HofCorey HaroldP   20 mg at 12/21/19 0902  . insulin aspart (novoLOG) injection 0-15 Units  0-15 Units Subcutaneous Q4H OgaFrederik PearD   5 Units at 12/23/19 0859  . insulin glargine (LANTUS) injection 15 Units  15 Units Subcutaneous Daily CriJamesetta SoD   15 Units at 12/23/19 0908  . ipratropium-albuterol (DUONEB) 0.5-2.5 (3) MG/3ML nebulizer solution 3 mL  3 mL Nebulization Q6H PRN EveDagoberto LigasA-C   3 mL at 12/11/19 0522  . ipratropium-albuterol (DUONEB) 0.5-2.5 (3) MG/3ML nebulizer solution 3 mL  3 mL Nebulization TID FieElam DutchD   3 mL at 12/23/19 0757  . loperamide HCl (IMODIUM) 1 MG/7.5ML suspension 2 mg  2 mg Per Tube PRN HarClydell HakimD      . MEDLINE mouth rinse  15 mL Mouth Rinse 10 times per day FieElam DutchD   15 mL at 12/23/19 0909  . metolazone (ZAROXOLYN) tablet 5 mg  5 mg Per Tube 2 times per day FieElam DutchD      . metolazone (ZAROXOLYN) tablet 5 mg  5 mg Per Tube  Once FieElam DutchD      . metoprolol tartrate (LOPRESSOR) injection 2-5 mg  2-5 mg Intravenous Q2H PRN EveDagoberto LigasA-C      . multivitamin with minerals tablet 1 tablet  1 tablet Per Tube Daily AgaKipp BroodD   1 tablet at 12/23/19 0908  . pantoprazole sodium (PROTONIX) 40 mg/20 mL oral suspension 40 mg  40 mg Per Tube QHS FieElam DutchD   40 mg at 12/25/2019 2251  . phenol (CHLORASEPTIC) mouth spray 1 spray  1 spray Mouth/Throat PRN EveDagoberto LigasA-C      . polyethylene glycol (MIRALAX / GLYCOLAX) packet 17 g  17 g Per Tube Daily PRN WinMaudie MercuryD      . potassium chloride SA (KLOR-CON) CR tablet 20-40 mEq  20-40 mEq Oral Daily  PRN [name], PA-C          Review of Systems: Unable to perform review of systems due to the patient's altered mental status  Physical Exam: Vitals:   12/23/19 0801 12/23/19 1044  BP:    Pulse:  95  Resp:  17  Temp:    SpO2: 97% 100%   Total I/O In: 145 [NG/GT:100; IV Piggyback:45] Out: -   Intake/Output Summary (Last 24 hours) at 12/23/2019 1045 Last data filed at 12/23/2019 0800 Gross per 24 hour  Intake 1818.78 ml  Output 1275 ml  Net 543.78 ml   Constitutional: Ill-appearing, no acute distress ENMT: ears and nose without scars or lesions, MMM, ET tube in mouth CV: normal rate, 2+ pitting edema in all four extremities Respiratory: Coarse bilateral breath sounds, bilateral chest rise Gastrointestinal: soft, non-tender, no palpable masses or hernias Skin: no visible lesions or rashes Psych: Awake, alert, unable to answer questions   Test Results I personally reviewed new and old clinical labs and radiology tests Lab Results  Component Value Date   NA 133 (L) 12/23/2019   K 4.8 12/23/2019   CL 94 (L) 12/23/2019   CO2 24 12/23/2019   BUN 134 (H) 12/23/2019   CREATININE 2.95 (H) 12/23/2019   CALCIUM 8.5 (L) 12/23/2019   ALBUMIN 2.8 (L) 12/23/2019   PHOS 7.7 (H) 12/23/2019

## 2019-12-23 NOTE — Progress Notes (Signed)
NAME:  Kelly Williams, MRN:  [id number], DOB:  1941-05-03, LOS: 48 ADMISSION DATE:  12/05/2019, CONSULTATION DATE:  12/12/2019 REFERRING MD:  Oneida Alar, CHIEF COMPLAINT:  Vent management for acute respiratory failure   Brief History   79 year old male with history of recurrent R ICA occlusion post-CEA who developed respoiratory failure requiring reintubation and pneumonia. He initially failed self extubation 8/5 after 24 hours off vent, and was again extubated 8/7 with seemingly good toleration. Then in the early am hours of 8/11, several hours after self-removal of Cortrak, he became unresponsive and hypoxemic requiring reintubation and transfer to ICU.  Patient is awaiting trach placement.  Past Medical History   Past Medical History:  Diagnosis Date   Alcohol abuse    Alcohol abuse    Arthritis    Carotid arterial disease ([place])    Chronic kidney disease    CKD-patient denies   COPD (chronic obstructive pulmonary disease) (HCC)    uses inhalers   Coronary artery disease    Diabetes mellitus without complication ([place])    Diabetic retinopathy ([place])    Hyperlipidemia    Hypertension    Normocytic anemia 01/16/2019   Stroke ([place])    around 2020, 2 after bypass in 2015   Significant Hospital Events   8/3: admitted to [facility], CEA, PCCM consulted for vent management 8/4: self extubated 8/5: Re-intubated overnight 8/7: Extubated 8/11: Reintubation   Consults:  Neurology   Procedures:  8/3: ETT 8/3: R CEA 8/3: Exploration of R CEA and R ICA thrombectomy 8/3: R art line   Significant Diagnostic Tests:  Carotid duplex 8/3 > right ICA occlusion CTA Head/Neck 8/3 > No acute intracranial abnormality. Post repeat right carotid endarterectomy. Decreased irregularity of the proximal cervical ICA. 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 Persistent narrowing and irregularity of the distal ICA with increased eccentric intraluminal  filling defect, which may reflect unstable/migrated clot. 60-70% stenosis of the proximal left ICA. No proximal intracranial vessel occlusion. Stable findings of multifocal intracranial atherosclerosis. MRI Brain wo Contrast 8/3 > 1. Unchanged appearance of acute right MCA territory infarct. No new site of acute ischemia. 2. Unchanged generalized atrophy and findings of chronic ischemic Microangiopathy. MR Brain wo Contrast 8/6 > Unchanged appearance of a subacute right MCA territory infarct centered within the right frontal operculum as compared to the MRI of 12/21/2019. No significant mass effect or midline shift. Expected evolution of a known subacute infarct within the right parietal lobe. No interval acute infarct is demonstrated. Stable background generalized parenchymal atrophy and chronic ischemic changes with multiple chronic infarcts as detailed. CT Head wo Contrast 8/11 > 1. Stable appearance of subacute and chronic right MCA infarcts. 2. Stable remote infarcts of the high right frontal lobe and left basal ganglia. 3. No acute intracranial abnormality or significant interval change.  Micro Data:  MRSA 8/3 Negative Respiratory Cx 8/5 > Negative  Blood Cx 8/11 > Negative  Respiratory Cx 8/13 > pansensitive pseudomonas MRSA 8/16 Negative  Antimicrobials:  Cefazolin 8/3 Unasyn 8/5 > 8/12 Zosyn 8/12> 8/16 Ceftazidime 8/16> 8/17  Interim history/subjective:   Elderly male, trach dependent, on vent. Still weak. Didn't tolerate PS trials yesterday   Objective   Blood pressure (!) 145/68, pulse 87, temperature 98.4 F (36.9 C), temperature source Oral, resp. rate 18, height 5\' 6"  (1.676 m), weight 77 kg, SpO2 99 %.    Vent Mode: PRVC FiO2 (%):  [30 %-40 %] 40 % Set  Rate:  [18 bmp] 18 bmp Vt Set:  [510 mL] 510 mL PEEP:  [5 cmH20] 5 cmH20 Plateau Pressure:  [21 ZOX09-60 cmH20] 21 cmH20   Intake/Output Summary (Last 24 hours) at 12/23/2019 4540 Last data filed at 12/23/2019  0600 Gross per 24 hour  Intake 2384.87 ml  Output 1295 ml  Net 1089.87 ml   Filed Weights   12/20/19 0500 12/21/19 0500 12/29/2019 0500  Weight: 75.5 kg 79.2 kg 77 kg   Examination: General: elderly frail gentleman, trach, on vent  HENT: NCAT, sclera clear, tracking  Lungs: BL mechanically ventilated breaths  Cardiovascular: RRR, s1 s2  Abdomen: soft, nt nd  Extremities: no edema  Neuro: alert oriented, follows commands   Assessment & Plan:   Chronic Respiratory failure s/p multiple failed extubations, now trach and vent dependent  - continue routine trach care  - PS trials as tolerated. Vent settings reviewed Vent Mode: CPAP;PSV FiO2 (%):  [40 %] 40 % Set Rate:  [18 bmp] 18 bmp Vt Set:  [510 mL] 510 mL PEEP:  [5 cmH20] 5 cmH20 Pressure Support:  [12 cmH20-15 cmH20] 12 cmH20 Plateau Pressure:  [21 cmH20-26 cmH20] 21 cmH20  - mobility, OOB and UIC as tolerated  - PT OT to continue to work with him  - likely need LTACH placement   Bilateral pleural effusions and acute pulmonary edema - continue diuresis  - discussed with nephrology   AKI on CKD:  Hyponatremia - worsening renal function  - case discussed with Dr. Joylene Grapes, possible cardiorenal with low urine sodium  - has good urine output with diuresis as this time   Elevated LFTs: Alkaline phosphatase elevated. RUQ [place] without any biliary duct dilation. Suspect intrahepatic cholestasis. - continue to monitor   Normocytic anemia:  - follow for any overt signs of bleeding  - conservative transfusion threshold hgb <7  R CEA revision:  - post-op care per vascular sx   Best practice:  Diet: tube feeds  Pain/Anxiety/Delirium protocol (if indicated): per protocol VAP protocol (if indicated): per protocol  DVT prophylaxis: SCDs GI prophylaxis: PPI Glucose control: Lantus + SSI Mobility: Bed rest Code Status: FULL Family Communication: family to be updated   Disposition: ICU  This patient is critically ill with  multiple organ system failure; which, requires frequent high complexity decision making, assessment, support, evaluation, and titration of therapies. This was completed through the application of advanced monitoring technologies and extensive interpretation of multiple databases. During this encounter critical care time was devoted to patient care services described in this note for 32 minutes.  Garner Nash, DO [facility] 12/23/2019 7:23 AM

## 2019-12-23 NOTE — Progress Notes (Signed)
eLink Physician-Brief Progress Note Patient Name: Kelly Williams DOB: 16-Feb-1941 MRN: [id number]   Date of Service  12/23/2019  HPI/Events of Note  RN notified us of an episode of vomiting after which patient desaturated. Very comfortable on camera at this time with o2 sat 99 on 60%/peep 5. Belly is not tender. I am told trach cuff is ok.   eICU Interventions  Hold feeds Cannot be placed on suction due to only having a Cortrack Get CXR and KUB now Asked RN to let us know when done      Intervention Category Major Interventions: Respiratory failure - evaluation and management  Margaretmary Lombard 12/23/2019, 11:16 PM

## 2019-12-24 ENCOUNTER — Encounter (HOSPITAL_COMMUNITY): Payer: Self-pay | Admitting: Otolaryngology

## 2019-12-24 DIAGNOSIS — N179 Acute kidney failure, unspecified: Secondary | ICD-10-CM

## 2019-12-24 DIAGNOSIS — Z7189 Other specified counseling: Secondary | ICD-10-CM

## 2019-12-24 DIAGNOSIS — Z515 Encounter for palliative care: Secondary | ICD-10-CM

## 2019-12-24 DIAGNOSIS — I639 Cerebral infarction, unspecified: Secondary | ICD-10-CM

## 2019-12-24 LAB — GLUCOSE, CAPILLARY
Glucose-Capillary: 178 mg/dL — ABNORMAL HIGH (ref 70–99)
Glucose-Capillary: 182 mg/dL — ABNORMAL HIGH (ref 70–99)
Glucose-Capillary: 88 mg/dL (ref 70–99)
Glucose-Capillary: 97 mg/dL (ref 70–99)
Glucose-Capillary: 124 mg/dL — ABNORMAL HIGH (ref 70–99)
Glucose-Capillary: 108 mg/dL — ABNORMAL HIGH (ref 70–99)
Glucose-Capillary: 98 mg/dL (ref 70–99)
Glucose-Capillary: 91 mg/dL (ref 70–99)

## 2019-12-24 LAB — RENAL FUNCTION PANEL
Albumin: 2.6 g/dL — ABNORMAL LOW (ref 3.5–5.0)
Anion gap: 17 — ABNORMAL HIGH (ref 5–15)
BUN: 142 mg/dL — ABNORMAL HIGH (ref 8–23)
CO2: 25 mmol/L (ref 22–32)
Calcium: 8.8 mg/dL — ABNORMAL LOW (ref 8.9–10.3)
Chloride: 95 mmol/L — ABNORMAL LOW (ref 98–111)
Creatinine, Ser: 3.16 mg/dL — ABNORMAL HIGH (ref 0.61–1.24)
GFR calc Af Amer: 21 mL/min — ABNORMAL LOW (ref >60)
GFR calc non Af Amer: 18 mL/min — ABNORMAL LOW (ref >60)
Glucose, Bld: 97 mg/dL (ref 70–99)
Phosphorus: 7.9 mg/dL — ABNORMAL HIGH (ref 2.5–4.6)
Potassium: 4.9 mmol/L (ref 3.5–5.1)
Sodium: 137 mmol/L (ref 135–145)

## 2019-12-24 MED ORDER — POLYETHYLENE GLYCOL 3350 17 G PO PACK
17.0000 g | PACK | Freq: Every day | ORAL | Status: AC
  Administered 2019-12-24 – 2019-12-25 (×2): 17 g
  Filled 2019-12-24 (×2): qty 1

## 2019-12-24 MED ORDER — FUROSEMIDE 10 MG/ML IJ SOLN
8.0000 mg/h | INTRAVENOUS | Status: DC
  Administered 2019-12-24: 8 mg/h via INTRAVENOUS
  Filled 2019-12-24: qty 25

## 2019-12-24 MED ORDER — DOCUSATE SODIUM 50 MG/5ML PO LIQD
100.0000 mg | Freq: Every day | ORAL | Status: AC
  Administered 2019-12-24 – 2019-12-25 (×2): 100 mg
  Filled 2019-12-24 (×2): qty 10

## 2019-12-24 NOTE — Progress Notes (Addendum)
   Trach in place O2 SAT 99 Heart Afib  Right neck incision healing well Urine OP > 1000 last 24 hours Cr 3.16 Nephrology following with diuresis Right UE grip intact, feet warm and well perfused Active movement of left foot, not right  He does not open his eyes this am.  Follows some commands.  s/p redo CEA complicated by cranial nerve neuropraxia post op with swallowing difficulties COPD deconditioning Complicated post op course requiring trach   Roxy Horseman PA-C  Vitals:   12/24/19 1300 12/24/19 1400 12/24/19 1500 12/24/19 1549  BP: 125/61 (!) 144/67 126/69 126/69  Pulse: 89 94 85 90  Resp: 14 18 16 16   Temp:      TempSrc:      SpO2: 98% 97% 97% 97%  Weight:      Height:       Following commands Some bleeding from ET tube today Febrile this afternoon cultures in progress Right neck incision healing  CBC    Component Value Date/Time   WBC 11.9 (H) 12/17/2019 0453   RBC 2.47 (L) 12/30/2019 0453   HGB 7.6 (L) 12/05/2019 0453   HCT 23.4 (L) 12/13/2019 0453   PLT 521 (H) 12/03/2019 0453   MCV 94.7 12/11/2019 0453   MCH 30.8 12/21/2019 0453   MCHC 32.5 12/11/2019 0453   RDW 14.8 12/21/2019 0453   LYMPHSABS 3.4 11/26/2019 0004   MONOABS 0.9 11/26/2019 0004   EOSABS 0.3 11/26/2019 0004   BASOSABS 0.1 11/26/2019 0004    BMET    Component Value Date/Time   NA 137 12/24/2019 0616   K 4.9 12/24/2019 0616   CL 95 (L) 12/24/2019 0616   CO2 25 12/24/2019 0616   GLUCOSE 97 12/24/2019 0616   BUN 142 (H) 12/24/2019 0616   CREATININE 3.16 (H) 12/24/2019 0616   CREATININE 1.34 (H) 08/24/2019 1114   CALCIUM 8.8 (L) 12/24/2019 0616   GFRNONAA 18 (L) 12/24/2019 0616   GFRNONAA 50 (L) 08/24/2019 1114   GFRAA 21 (L) 12/24/2019 0616   GFRAA 58 (L) 08/24/2019 1114    S/p redo CEA with post op aspiration and difficulty weaning vent. Hopefully can make some progress getting off vent now that he has trach Tube feeds currently on hold for vomiting and no recent  BM Renal function continues to decline Cultures pending for eval of fever   Right side pleural effusion occupying half of right lung field.  He may have more success weaning if this is drained.  Will defer to critical care service.  Ruta Hinds, MD [facility] Office: [phone]

## 2019-12-24 NOTE — Progress Notes (Signed)
Physical Therapy Treatment Patient Details Name: Kelly Williams MRN: [id number] DOB: [birth year] Today's Date: 12/24/2019    History of Present Illness Pt is a 79 y/o male with PMH of stroke, HTN, DM, periphreal neuropathy, carotid disease with L carotid endarterectomy, CABG x 5, R shoulder arthroplasthy, medication noncompliance and alcohol abuse. Daughter found patient after falling and intoxicated, L facial droop, slurring speech and aggression (reports hx of mulitple falls). MRI reveals acute ischemic infarcts of R frontal and parietal lobes. MRA of neck revealed focal stenosis in the right internal carotid artery approximately 3 cm distal to the bifurcation with an adjacent 11 mm aneurysmal dilatation. Pt underwent R carotid endarterectomy on 8/3. Pt with change in neuro status after procedure and was taken back to OR for exploration with no significant findings. Pt required intubation on 8/5 due to desaturation, extubated 12/08/19.  Reintubated on 8/11 due to hypoxia and transferred to ICU    PT Comments    Pt with minimal change. Pt was moving R UE and following commands initially but then stopped. Pt remains dependent for all mobility and to maintain EOB sitting balance. Pt also with significant edema in scrotum and x4 extremities. Acute PT to cont to follow.    Follow Up Recommendations  SNF     Equipment Recommendations  Wheelchair (measurements PT);Wheelchair cushion (measurements PT);Hospital bed    Recommendations for Other Services       Precautions / Restrictions Precautions Precautions: Fall Precaution Comments: Trach, vent Restrictions Weight Bearing Restrictions: No    Mobility  Bed Mobility Overal bed mobility: Needs Assistance Bed Mobility: Sit to Supine;Rolling;Sidelying to Sit Rolling: Max assist;+2 for physical assistance Sidelying to sit: Max assist;+2 for physical assistance Supine to sit: Max assist;+2 for physical assistance Sit to supine: Max assist;+2  for physical assistance   General bed mobility comments: used scrotal sack to help manage very edematos scrotum  Transfers                 General transfer comment: not completed this date  Ambulation/Gait                 Stairs             Wheelchair Mobility    Modified Rankin (Stroke Patients Only) Modified Rankin (Stroke Patients Only) Pre-Morbid Rankin Score: Moderate disability Modified Rankin: Severe disability     Balance Overall balance assessment: Needs assistance Sitting-balance support: Feet unsupported;Bilateral upper extremity supported Sitting balance-Leahy Scale: Zero Sitting balance - Comments: max assist from bed chair position.  Postural control: Right lateral lean                                  Cognition Arousal/Alertness: Awake/alert (but non-conversant, eyes open) Behavior During Therapy: Flat affect Overall Cognitive Status: Difficult to assess Area of Impairment: Following commands                   Current Attention Level: Focused   Following Commands: Follows one step commands with increased time;Follows one step commands inconsistently (pt initially following with R UE but then stopped)     Problem Solving: Slow processing;Decreased initiation;Difficulty sequencing;Requires verbal cues;Requires tactile cues General Comments: pt initially following simple commands with R UE however then stopped 1/2 way through session. pt with no tracking to the L or active L UE movement,      Exercises General Exercises - Lower Extremity Long Arc  Quad: PROM;Both;10 reps;Seated Other Exercises Other Exercises: PROM to bilat shoudlers and elbows in sitting    General Comments General comments (skin integrity, edema, etc.): pt with edema in scotrum and x4 extremities      Pertinent Vitals/Pain Pain Assessment: Faces Faces Pain Scale: No hurt    Home Living                      Prior Function             PT Goals (current goals can now be found in the care plan section) Progress towards PT goals: Not progressing toward goals - comment    Frequency    Min 2X/week      PT Plan Current plan remains appropriate    Co-evaluation              AM-PAC PT "6 Clicks" Mobility   Outcome Measure  Help needed turning from your back to your side while in a flat bed without using bedrails?: Total Help needed moving from lying on your back to sitting on the side of a flat bed without using bedrails?: Total Help needed moving to and from a bed to a chair (including a wheelchair)?: Total Help needed standing up from a chair using your arms (e.g., wheelchair or bedside chair)?: Total Help needed to walk in hospital room?: Total Help needed climbing 3-5 steps with a railing? : Total 6 Click Score: 6    End of Session Equipment Utilized During Treatment:  (trach) Activity Tolerance: Patient tolerated treatment well Patient left: in bed;with call bell/phone within reach;with bed alarm set Nurse Communication: Mobility status;Other (comment) PT Visit Diagnosis: Other abnormalities of gait and mobility (R26.89);Other symptoms and signs involving the nervous system (R29.898)     Time: 2458-0998 PT Time Calculation (min) (ACUTE ONLY): 24 min  Charges:  $Therapeutic Exercise: 8-22 mins $Therapeutic Activity: 8-22 mins                     Kittie Plater, PT, DPT Acute Rehabilitation Services Pager #: [phone] Office #: [phone]    Berline Lopes 12/24/2019, 1:43 PM

## 2019-12-24 NOTE — Consult Note (Signed)
Consultation Note Date: 12/24/2019   Patient Name: Kelly Williams  DOB: Aug 13, 1940  MRN: [id number]  Age / Sex: 79 y.o., male  PCP: Luetta Nutting, DO Referring Physician: Elam Dutch, MD  Reason for Consultation: Establishing goals of care  HPI/Patient Profile: 79 y.o. male  with past medical history of stroke, hypertension, hyperlipidemia, diabetic retinopathy, DM, CAD, s/p CABG, COPD, CKD, arthritis, ETOH use admitted on 12/26/2019 with history of recurrent right ICA occlusion s/p CEA. Developed respiratory failure requiring intubation. Patient self-extubated 8/5, again extubated 8/7. On 8/11 AM, patient became unresponsive and hypoxemic requiring re-intubation and transfer to ICU. Trach placed 8/21. PT/OT following. Likely will need LTACH placement. Patient now with worsening AKI on CKD, possibly cardiorenal syndrome. Nephrology consulted. Patient receiving high-dose IV diuretics but worsening creatinine. May requiring dialysis and poor candidate for long-term dialysis with underlying chronic conditions and current condition with tracheostomy/vent dependency. Palliative medicine consultation for goals of care.   Clinical Assessment and Goals of Care:  I have reviewed medical records, discussed with care team, and completed patient assessment. Kelly Williams will wake to voice and track this NP at bedside. He is drowsy and does not follow commands this afternoon but per RN, recently worked with therapy. Does not appear to be in pain or discomfort. No family at bedside.  Spoke with daughter, Darran Gabay via telephone.   Introduced Palliative Medicine as specialized medical care for people living with serious illness. It focuses on providing relief from the symptoms and stress of a serious illness. The goal is to improve quality of life for both the patient and the family.  We discussed a brief life review of  the patient. Kelly Williams shares that prior to hospitalization, patient was 'happy, self sufficient, and driving.' They do live together and she acknowledges intermittent alcohol binging (last in May). Mr. Harvel has been married and divorced x3. Kelly Williams has one sister from one of his other marriage, but sister lives in [place] and minimally involved in his care since acute decline.    Discussed events leading up to admission and course of hospitalization including diagnoses, interventions, plan of care. Reviewed nephrology and critical care notes from today, explaining concern with worsening kidney function, possibly cardiorenal, and plan for aggressive diuresis. Discussed poor candidacy for long-term dialysis if it comes to this. 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 This is all overwhelming to her as her father was in fairly good health prior to admission. She feels everything has gone wrong since his endarterectomy. Kelly Williams is following his MyChart closely.   I attempted to elicit values and goals of care important to the patient and daughter. Advanced directives and concepts specific to code status discussed. The only comments Kelly Williams has made to Kelly Williams in the past is that if he was in a coma, he would not want prolonged life support. Explored decision for tracheostomy, for which she tried to explain  this to her father in a very black and white fashion. 'If you don't want the trach, you are telling them you want to die' for which he immediately shook his head "NO." Trach was placed 8/21.  Kelly Williams is able to communicate some with her father and will attempt communication with a clipboard or tablet tomorrow. Kelly Williams reports she will be at [facility] tomorrow and we can follow-up in person. We would like to see if Mr. Nephew can attempt communication of his wishes with [place]. Plan to meet after  11am.   SUMMARY OF RECOMMENDATIONS    Brief initial [place] with daughter, Kelly Williams. F/u conversation at bedside tomorrow 8/24 after 11am. Kelly Williams hopeful that her father can attempt participation in this conversation via clipboard or tablet.   Continue full code/full scope treatment.  Ongoing palliative discussions.    Code Status/Advance Care Planning:  Full code  Symptom Management:   Per attending  Palliative Prophylaxis:   Aspiration, Delirium Protocol, Frequent Pain Assessment, Oral Care and Turn Reposition  Additional Recommendations (Limitations, Scope, Preferences):  Full Scope Treatment  Psycho-social/Spiritual:   Desire for further Chaplaincy support:yes  Additional Recommendations: Caregiving  Support/Resources and Compassionate Wean Education  Prognosis:   Unable to determine: guarded  Discharge Planning: To Be Determined      Primary Diagnoses: Present on Admission: . Carotid artery stenosis   I have reviewed the medical record, interviewed the patient and family, and examined the patient. The following aspects are pertinent.  Past Medical History:  Diagnosis Date  . Alcohol abuse   . Alcohol abuse   . Arthritis   . Carotid arterial disease ([place])   . Chronic kidney disease    CKD-patient denies  . COPD (chronic obstructive pulmonary disease) (HCC)    uses inhalers  . Coronary artery disease   . Diabetes mellitus without complication ([place])   . Diabetic retinopathy ([place])   . Hyperlipidemia   . Hypertension   . Normocytic anemia 01/16/2019  . Stroke [facility])    around 2020, 2 after bypass in 2015   Social History   Socioeconomic History  . Marital status: Divorced    Spouse name: Not on file  . Number of children: Not on file  . Years of education: Not on file  . Highest education level: Not on file  Occupational History  . Not on file  Tobacco Use  . Smoking status: Former Smoker    Packs/day: 1.00    Years: 10.00    Pack  years: 10.00    Types: Cigarettes    Quit date: 10/12/2002    Years since quitting: 17.2  . Smokeless tobacco: Never Used  Vaping Use  . Vaping Use: Never used  Substance and Sexual Activity  . Alcohol use: Not Currently  . Drug use: Never  . Sexual activity: Not Currently    Birth control/protection: Abstinence, Surgical  Other Topics Concern  . Not on file  Social History Narrative  . Not on file   Social Determinants of Health   Financial Resource Strain:   . Difficulty of Paying Living Expenses: Not on file  Food Insecurity:   . Worried About Charity fundraiser in the Last Year: Not on file  . Ran Out of Food in the Last Year: Not on file  Transportation Needs:   . Lack of Transportation (Medical): Not on file  . Lack of Transportation (Non-Medical): Not on file  Physical Activity:   . Days of Exercise per Week: Not on  file  . Minutes of Exercise per Session: Not on file  Stress:   . Feeling of Stress : Not on file  Social Connections:   . Frequency of Communication with Friends and Family: Not on file  . Frequency of Social Gatherings with Friends and Family: Not on file  . Attends Religious Services: Not on file  . Active Member of Clubs or Organizations: Not on file  . Attends Archivist Meetings: Not on file  . Marital Status: Not on file   Family History  Problem Relation Age of Onset  . Hypertension Mother   . Hypertension Father    Scheduled Meds: . sodium chloride   Intravenous Once  . atorvastatin  80 mg Per Tube QHS  . bethanechol  10 mg Per Tube TID  . budesonide (PULMICORT) nebulizer solution  0.5 mg Nebulization BID  . chlorhexidine gluconate (MEDLINE KIT)  15 mL Mouth Rinse BID  . Chlorhexidine Gluconate Cloth  6 each Topical Q0600  . docusate  100 mg Per Tube Daily  . feeding supplement (PROSource TF)  45 mL Per Tube TID  . heparin injection (subcutaneous)  5,000 Units Subcutaneous Q8H  . insulin aspart  0-15 Units Subcutaneous Q4H   . insulin glargine  15 Units Subcutaneous Daily  . ipratropium-albuterol  3 mL Nebulization TID  . mouth rinse  15 mL Mouth Rinse 10 times per day  . metolazone  5 mg Per Tube 2 times per day  . multivitamin with minerals  1 tablet Per Tube Daily  . pantoprazole sodium  40 mg Per Tube QHS  . polyethylene glycol  17 g Per Tube Daily   Continuous Infusions: . sodium chloride Stopped (12/23/19 1248)  . feeding supplement (JEVITY 1.2 CAL) Stopped (12/23/19 2230)  . ferumoxytol 510 mg (12/23/19 0917)  . furosemide (LASIX) infusion 8 mg/hr (12/24/19 1200)   PRN Meds:.sodium chloride, acetaminophen **OR** acetaminophen, fentaNYL (SUBLIMAZE) injection, fentaNYL (SUBLIMAZE) injection, hydrALAZINE, ipratropium-albuterol, loperamide HCl, metoprolol tartrate, ondansetron (ZOFRAN) IV, phenol, potassium chloride Medications Prior to Admission:  Prior to Admission medications   Medication Sig Start Date End Date Taking? Authorizing Provider  acetaminophen (TYLENOL) 500 MG tablet Take 1,000 mg by mouth every 6 (six) hours as needed for mild pain.   Yes [provider]  albuterol (PROVENTIL HFA;VENTOLIN HFA) 108 (90 Base) MCG/ACT inhaler Inhale 1-2 puffs into the lungs every 4 (four) hours as needed for wheezing or shortness of breath (bronchospasm). 06/20/18  Yes Trixie Dredge, PA-C  Alcohol Swabs (B-D SINGLE USE SWABS REGULAR) PADS Use to check glucose daily Patient taking differently: 1 each by Other route See admin instructions. Use to check glucose daily 09/07/19  Yes Luetta Nutting, DO  aspirin EC 325 MG EC tablet Take 1 tablet (325 mg total) by mouth daily. 11/30/19  Yes Shelly Coss, MD  atorvastatin (LIPITOR) 80 MG tablet Take 1 tablet (80 mg total) by mouth at bedtime. 11/29/19  Yes Shelly Coss, MD  bismuth subsalicylate (PEPTO BISMOL) 262 MG/15ML suspension Take 30 mLs by mouth every 6 (six) hours as needed for indigestion or diarrhea or loose stools.   Yes [provider]  Blood Glucose Monitoring Suppl (TRUE METRIX AIR GLUCOSE METER) DEVI Dx DM E11.9 Check fasting blood sugar every morning and once 2 hours after largest meal of the day. Patient taking differently: 1 each by Other route See admin instructions. Dx DM E11.9 Check fasting blood sugar every morning and once 2 hours after largest meal of the  day. 09/13/19  Yes Luetta Nutting, DO  celecoxib (CELEBREX) 100 MG capsule Take 1 capsule (100 mg total) by mouth 2 (two) times daily as needed for mild pain. Patient taking differently: Take 200 mg by mouth 2 (two) times daily as needed for mild pain.  09/10/19  Yes Luetta Nutting, DO  clopidogrel (PLAVIX) 75 MG tablet Take 1 tablet (75 mg total) by mouth daily. 11/30/19  Yes Adhikari, Tamsen Meek, MD  Dulaglutide (TRULICITY) 9.73 ZH/2.9JM SOPN Inject 0.75 mg into the skin once a week. Patient taking differently: Inject 0.75 mg into the skin once a week. Saturday 09/10/19  Yes Luetta Nutting, DO  gabapentin (NEURONTIN) 300 MG capsule Take 1 capsule (300 mg total) by mouth 3 (three) times daily. 09/07/19  Yes Luetta Nutting, DO  glucose blood (TRUE METRIX BLOOD GLUCOSE TEST) test strip Use as instructed Patient taking differently: 1 each by Other route as directed.  09/07/19  Yes Luetta Nutting, DO  ipratropium-albuterol (DUONEB) 0.5-2.5 (3) MG/3ML SOLN Take 3 mLs by nebulization every 6 (six) hours as needed. Patient taking differently: Take 3 mLs by nebulization every 6 (six) hours as needed (COPD).  10/27/18  Yes Trixie Dredge, PA-C  loperamide (IMODIUM) 2 MG capsule Take 1 capsule (2 mg total) by mouth every 8 (eight) hours as needed for diarrhea or loose stools. 11/29/19  Yes Shelly Coss, MD  losartan (COZAAR) 25 MG tablet Take 1 tablet (25 mg total) by mouth every morning. Patient taking differently: Take 25 mg by mouth at bedtime.  06/25/19  Yes Buford Dresser, MD  Respiratory Therapy Supplies (NEBULIZER/TUBING/MOUTHPIECE) KIT Use as  directed with nebulizer treatments (Duoneb) every 4 hours prn. Dx: J44.9 Patient taking differently: 1 each by Other route See admin instructions. Use as directed with nebulizer treatments (Duoneb) every 4 hours prn. Dx: J44.9 06/20/18  Yes Trixie Dredge, PA-C  TRUEplus Lancets 30G MISC Use to check glucose daily Patient taking differently: 1 each by Other route daily.  09/07/19  Yes Luetta Nutting, DO  Tiotropium Bromide-Olodaterol (STIOLTO RESPIMAT) 2.5-2.5 MCG/ACT AERS Inhale 2 puffs into the lungs daily. Patient not taking: Reported on 11/26/2019 10/27/18   Trixie Dredge, PA-C   Allergies  Allergen Reactions  . Metformin And Related Diarrhea   Review of Systems  Unable to perform ROS: Acuity of condition   Physical Exam Vitals and nursing note reviewed.  Constitutional:      Appearance: He is ill-appearing.  HENT:     Head: Normocephalic and atraumatic.  Pulmonary:     Effort: No tachypnea, accessory muscle usage or respiratory distress.     Comments: Trach/vent Neurological:     Mental Status: He is easily aroused.     Comments: Tracking but not following commands. Drowsy. Recently worked with PT.    Vital Signs: BP 125/61   Pulse 89   Temp (!) 97.4 F (36.3 C) (Axillary)   Resp 14   Ht 5' 6"  (1.676 m)   Wt 77 kg   SpO2 98%   BMI 27.40 kg/m  Pain Scale: CPOT   Pain Score: Asleep   SpO2: SpO2: 98 % O2 Device:SpO2: 98 % O2 Flow Rate: .O2 Flow Rate (L/min): 15 L/min  IO: Intake/output summary:   Intake/Output Summary (Last 24 hours) at 12/24/2019 1503 Last data filed at 12/24/2019 1345 Gross per 24 hour  Intake 565.93 ml  Output 2200 ml  Net -1634.07 ml    LBM: Last BM Date: 12/21/19 Baseline Weight: Weight: 63.5 kg Most recent weight:  Weight: 77 kg     Palliative Assessment/Data: PPS 30%     Time In: 1430 Time Out: 1515 Time Total: 77mn Greater than 50%  of this time was spent counseling and coordinating care related to the  above assessment and plan.  Signed by:  MIhor Dow DNP, FNP-C Palliative Medicine Team   Phone: [phone]: [phone]  Please contact Palliative Medicine Team phone at [phone] questions and concerns.  For individual provider: See AShea Evans

## 2019-12-24 NOTE — Progress Notes (Signed)
[facility] NEPHROLOGY PROGRESS NOTE  Assessment/ Plan: Pt is a 79 y.o. yo male  medical history significant for alcohol use disorder, PAD, COPD CAD, DM 2, HLD, HTN, CVAwho present w/endarterectomy revision complicated by ventilatory dependence and AKI  # Severe nonoliguric AKI on CKD 3:  Likely due to cardiorenal syndrome.  Renal ultrasound without obstruction.  Complicated hospital course with fluctuation in serum creatinine level.  Urine output is picking up with high-dose IV diuretics, Lasix 160 mg 3 times a day and metolazone 5 mg.  However, renal parameters especially BUN/creatinine level continue to worsen.  He still looks volume overloaded on exam therefore continue current diuretics. I am concerned that he might be heading towards requiring dialysis which may complicates his further hospital course and discharge planning.  He has tracheostomy and multiple comorbidity.  Recommend palliative care consult to discuss goals of care.  Discussed with ICU team.  #HTN/Volume Status: Appearsvolume overloaded on exam. Based on his examination and review of available data we recommend diuretics as above.  Continue metoprolol  #Hypoxic respiratory failure/ventilatory dependence: Management per primary team.  He has tracheostomy   #Anemia multifactorial including kidney disease, chronic illness: Continue to monitor.  Ferritin 325.  Iron saturation of 12.  Feraheme x2 doses.  Monitor hemoglobin  # Hyponatremia hypervolemic: Serum sodium level improved with IV loop diuretics.  #Hyperkalemia: Resolved.   Discussed with ICU team.  Subjective: Seen and examined.  He was trying to work with PT.  Urine output 1650 cc.  Review of system limited. Objective Vital signs in last 24 hours: Vitals:   12/24/19 0900 12/24/19 1000 12/24/19 1100 12/24/19 1117  BP: 139/69 140/74 136/62 136/62  Pulse: 97 96 91 88  Resp: _0 Temp:      TempSrc:      SpO2: 96% 96% 96% 97%   Weight:      Height:       Weight change:   Intake/Output Summary (Last 24 hours) at 12/24/2019 1205 Last data filed at 12/24/2019 1130 Gross per 24 hour  Intake 873.47 ml  Output 2500 ml  Net -1626.53 ml       Labs: Basic Metabolic Panel: Recent Labs  Lab 12/12/2019 0453 12/23/19 0441 12/24/19 0616  NA 133* 133* 137  K 4.8 4.8 4.9  CL 95* 94* 95*  CO2 _1 GLUCOSE 154* 198* 97  BUN 124* 134* 142*  CREATININE 2.77* 2.95* 3.16*  CALCIUM 8.6* 8.5* 8.8*  PHOS  --  7.7* 7.9*   Liver Function Tests: Recent Labs  Lab 12/20/19 0905 12/20/19 0905 12/21/19 0617 12/21/19 0617 12/11/2019 0453 12/23/19 0441 12/24/19 0616  AST 65*  --  73*  --  82*  --   --   ALT 32  --  33  --  34  --   --   ALKPHOS 374*  --  393*  --  340*  --   --   BILITOT 1.2  --  1.4*  --  1.5*  --   --   PROT 7.2  --  6.5  --  6.5  --   --   ALBUMIN 2.9*   < > 3.1*   < > 3.0* 2.8* 2.6*   < > = values in this interval not displayed.   No results for input(s): LIPASE, AMYLASE in the last 168 hours. No results for input(s): AMMONIA in the last 168 hours. CBC: Recent Labs  Lab 12/18/19 0601 12/18/19 0601  12/19/19 0905 12/19/19 0905 12/20/19 0905 12/20/19 1406 12/21/19 0617 12/21/19 1206 12/07/2019 0453  WBC 10.8*   < > 13.5*   < > 12.2*  --  10.0  --  11.9*  HGB 7.8*   < > 8.5*   < > 7.9*   < > 7.8* 8.2* 7.6*  HCT 24.1*   < > 26.8*   < > 24.5*   < > 24.3* 24.0* 23.4*  MCV 92.3  --  94.4  --  95.3  --  95.3  --  94.7  PLT 461*   < > 529*   < > 566*  --  523*  --  521*   < > = values in this interval not displayed.   Cardiac Enzymes: No results for input(s): CKTOTAL, CKMB, CKMBINDEX, TROPONINI in the last 168 hours. CBG: Recent Labs  Lab 12/23/19 1147 12/23/19 1523 12/23/19 1942 12/24/19 0744 12/24/19 1126  GLUCAP 239* 224* 206* 97 124*    Iron Studies:  Recent Labs    12/06/2019 1700  IRON 22*  TIBC 179*  FERRITIN 325   Studies/Results: DG Abd 1 View  Result Date:  12/23/2019 CLINICAL DATA:  Tube placement. EXAM: ABDOMEN - 1 VIEW COMPARISON:  December 05, 2019 FINDINGS: A nasogastric tube is seen with its distal tip overlying the expected region of the gastric antrum. Multiple sternal wires and vascular clips are seen within the visualized portion of the chest. Marked severity diffuse bilateral infiltrates are also noted. The bowel gas pattern is normal. No radio-opaque calculi or other significant radiographic abnormality are seen. IMPRESSION: Nasogastric tube positioning, as described above. Electronically Signed   By: Virgina Norfolk M.D.   On: 12/23/2019 23:44   DG Chest Port 1 View  Result Date: 12/23/2019 CLINICAL DATA:  Hypoxia EXAM: PORTABLE CHEST 1 VIEW COMPARISON:  12/21/2019 FINDINGS: Tracheostomy tube tip is at the level of the clavicular heads. Esophageal tube courses below the field of view. Remote median sternotomy. Intermediate sized right pleural effusion with basilar consolidation. IMPRESSION: Intermediate sized right pleural effusion and basilar consolidation. Electronically Signed   By: Ulyses Jarred M.D.   On: 12/23/2019 23:44    Medications: Infusions: . sodium chloride Stopped (12/23/19 1248)  . feeding supplement (JEVITY 1.2 CAL) Stopped (12/23/19 2230)  . ferumoxytol 510 mg (12/23/19 0917)  . furosemide (LASIX) infusion 8 mg/hr (12/24/19 1141)    Scheduled Medications: . sodium chloride   Intravenous Once  . atorvastatin  80 mg Per Tube QHS  . bethanechol  10 mg Per Tube TID  . budesonide (PULMICORT) nebulizer solution  0.5 mg Nebulization BID  . chlorhexidine gluconate (MEDLINE KIT)  15 mL Mouth Rinse BID  . Chlorhexidine Gluconate Cloth  6 each Topical Q0600  . docusate  100 mg Per Tube Daily  . feeding supplement (PROSource TF)  45 mL Per Tube TID  . heparin injection (subcutaneous)  5,000 Units Subcutaneous Q8H  . insulin aspart  0-15 Units Subcutaneous Q4H  . insulin glargine  15 Units Subcutaneous Daily  .  ipratropium-albuterol  3 mL Nebulization TID  . mouth rinse  15 mL Mouth Rinse 10 times per day  . metolazone  5 mg Per Tube 2 times per day  . multivitamin with minerals  1 tablet Per Tube Daily  . pantoprazole sodium  40 mg Per Tube QHS  . polyethylene glycol  17 g Per Tube Daily    have reviewed scheduled and prn medications.  Physical Exam: General:NAD, comfortable, has tracheostomy Heart:RRR,  s1s2 nl Lungs: Bibasal rhonchi Abdomen:soft, Non-tender, non-distended Extremities: Bilateral lower extremity pitting edema Dialysis Access: None  Jairo Bellew Tanna Furry 12/24/2019,12:05 PM  LOS: 20 days  Pager: [phone]

## 2019-12-24 NOTE — Progress Notes (Addendum)
Subjective: Resting comfortably in bed. On vent. No trach issues.  Objective: Vital signs in last 24 hours: Temp:  [97.6 F (36.4 C)-99.3 F (37.4 C)] 98.4 F (36.9 C) (08/23 0800) Pulse Rate:  [85-104] 97 (08/23 0900) Resp:  [4-24] 12 (08/23 0900) BP: (128-165)/(64-84) 139/69 (08/23 0900) SpO2:  [91 %-100 %] 96 % (08/23 0900) FiO2 (%):  [40 %-60 %] 40 % (08/23 0759)  Physical Exam: Head: NCAT. No lesion. Ears: Examination of the ears shows normal auricles and external auditory canals bilaterally.  Nose: Nasal examination shows normal mucosa, septum, turbinates.  Face: Facial examination shows no asymmetry.  Mouth: Oral cavity examination shows no mucosal lacerations. Neck: Right neck incision is c/d/i. The trach tube is in place and venting well. Neuro: On vent.  Recent Labs    12/21/19 1206 12/03/2019 0453  WBC  --  11.9*  HGB 8.2* 7.6*  HCT 24.0* 23.4*  PLT  --  521*   Recent Labs    12/23/19 0441 12/24/19 0616  NA 133* 137  K 4.8 4.9  CL 94* 95*  CO2 24 25  GLUCOSE 198* 97  BUN 134* 142*  CREATININE 2.95* 3.16*  CALCIUM 8.5* 8.8*    Medications:  I have reviewed the patient's current medications. Scheduled: . sodium chloride   Intravenous Once  . atorvastatin  80 mg Per Tube QHS  . bethanechol  10 mg Per Tube TID  . budesonide (PULMICORT) nebulizer solution  0.5 mg Nebulization BID  . chlorhexidine gluconate (MEDLINE KIT)  15 mL Mouth Rinse BID  . Chlorhexidine Gluconate Cloth  6 each Topical Q0600  . feeding supplement (PROSource TF)  45 mL Per Tube TID  . heparin injection (subcutaneous)  5,000 Units Subcutaneous Q8H  . insulin aspart  0-15 Units Subcutaneous Q4H  . insulin glargine  15 Units Subcutaneous Daily  . ipratropium-albuterol  3 mL Nebulization TID  . mouth rinse  15 mL Mouth Rinse 10 times per day  . metolazone  5 mg Per Tube 2 times per day  . multivitamin with minerals  1 tablet Per Tube Daily  . pantoprazole sodium  40 mg Per Tube QHS    Continuous: . sodium chloride Stopped (12/23/19 1248)  . feeding supplement (JEVITY 1.2 CAL) Stopped (12/23/19 2230)  . ferumoxytol 510 mg (12/23/19 0917)  . furosemide Stopped (12/24/19 0748)    Assessment/Plan: POD #2 s/p trach. - Routine fresh trach care. - Wean vent as tolerated. - Will change trach early next week. - May restart anti-coagulation today.   LOS: 20 days   Brycelyn Gambino W Karmello Abercrombie 12/24/2019, 9:29 AM

## 2019-12-24 NOTE — Progress Notes (Signed)
NAME:  NATASHA PAULSON, MRN:  [id number], DOB:  [birth year], LOS: 80 ADMISSION DATE:  12/03/2019, CONSULTATION DATE:  12/12/2019 REFERRING MD:  Oneida Alar, CHIEF COMPLAINT:  Vent management for acute respiratory failure   Brief History   79 year old male with history of recurrent R ICA occlusion post-CEA who developed respiratory failure requiring reintubation and pneumonia. He initially failed self extubation 8/5 after 24 hours off vent, and was again extubated 8/7 with seemingly good toleration. Then in the early am hours of 8/11, several hours after self-removal of Cortrak, he became unresponsive and hypoxemic requiring reintubation and transfer to ICU.  Patient is awaiting trach placement.  Past Medical History   Past Medical History:  Diagnosis Date  . Alcohol abuse   . Alcohol abuse   . Arthritis   . Carotid arterial disease ([place])   . Chronic kidney disease    CKD-patient denies  . COPD (chronic obstructive pulmonary disease) (HCC)    uses inhalers  . Coronary artery disease   . Diabetes mellitus without complication ([place])   . Diabetic retinopathy ([place])   . Hyperlipidemia   . Hypertension   . Normocytic anemia 01/16/2019  . Stroke [facility])    around 2020, 2 after bypass in 2015   Significant Hospital Events   8/3: admitted to [facility], CEA, PCCM consulted for vent management 8/4: self extubated 8/5: Re-intubated overnight 8/7: Extubated 8/11: Reintubation   Consults:  Neurology   Procedures:  8/3: ETT 8/3: R CEA 8/3: Exploration of R CEA and R ICA thrombectomy 8/3: R art line   Significant Diagnostic Tests:  Carotid duplex 8/3 > right ICA occlusion CTA Head/Neck 8/3 > No acute intracranial abnormality. Post repeat right carotid endarterectomy. Decreased irregularity of the proximal cervical ICA. 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 Persistent narrowing and irregularity of the distal ICA with increased eccentric intraluminal  filling defect, which may reflect unstable/migrated clot. 60-70% stenosis of the proximal left ICA. No proximal intracranial vessel occlusion. Stable findings of multifocal intracranial atherosclerosis. MRI Brain wo Contrast 8/3 > 1. Unchanged appearance of acute right MCA territory infarct. No new site of acute ischemia. 2. Unchanged generalized atrophy and findings of chronic ischemic Microangiopathy. MR Brain wo Contrast 8/6 > Unchanged appearance of a subacute right MCA territory infarct centered within the right frontal operculum as compared to the MRI of 12/18/2019. No significant mass effect or midline shift. Expected evolution of a known subacute infarct within the right parietal lobe. No interval acute infarct is demonstrated. Stable background generalized parenchymal atrophy and chronic ischemic changes with multiple chronic infarcts as detailed. CT Head wo Contrast 8/11 > 1. Stable appearance of subacute and chronic right MCA infarcts. 2. Stable remote infarcts of the high right frontal lobe and left basal ganglia. 3. No acute intracranial abnormality or significant interval change.  Micro Data:  MRSA 8/3 Negative Respiratory Cx 8/5 > Negative  Blood Cx 8/11 > Negative  Respiratory Cx 8/13 > pansensitive pseudomonas MRSA 8/16 Negative  Antimicrobials:  Cefazolin 8/3 Unasyn 8/5 > 8/12 Zosyn 8/12> 8/16 Ceftazidime 8/16> 8/17  Interim history/subjective:   Elderly male, trach dependent, on vent. Still weak. Working with PT today. Tolerating PSV  Objective   Blood pressure 139/69, pulse 97, temperature 98.4 F (36.9 C), temperature source Oral, resp. rate 12, height 5\' 6"  (1.676 m), weight 77 kg, SpO2 96 %.    Vent Mode: PSV;CPAP FiO2 (%):  [40 %-60 %] 40 % Set Rate:  [  18 bmp-518 bmp] 18 bmp Vt Set:  [510 mL] 510 mL PEEP:  [5 cmH20] 5 cmH20 Pressure Support:  [12 cmH20-14 cmH20] 14 cmH20 Plateau Pressure:  [29 cmH20] 29 cmH20   Intake/Output Summary (Last 24 hours) at  12/24/2019 1042 Last data filed at 12/24/2019 0930 Gross per 24 hour  Intake 873.47 ml  Output 2275 ml  Net -1401.53 ml   Filed Weights   12/20/19 0500 12/21/19 0500 12/25/2019 0500  Weight: 75.5 kg 79.2 kg 77 kg   Examination: General: elderly frail gentleman, trach, on vent  HENT: NCAT, sclera clear, tracheostomy intact.  Lungs: Diffuse rhonchi Cardiovascular: RRR, s1 s2, capillary refill <2sec.  Abdomen: soft, nt nd  Extremities: generalized edema.  Neuro: somnolent and will follow commands intermittently.    Assessment & Plan:   Critically ill due to acute respiratory failure s/p multiple failed extubations, now trach and vent dependent  - continue routine trach care  - PS trials as tolerated. Vent settings reviewed - mobility, OOB and UIC as tolerated  - PT OT to continue to work with him  - likely need LTACH placement   Bilateral pleural effusions and acute pulmonary edema - continue diuresis  - discussed with nephrology: long-term HD not advisable given stroke/tracheostomy.   AKI on CKD:  Hyponatremia. Cardiorenal syndrome unusual with HFPEF and normal BP.  - has good urine output with diuresis as this time continue to diurese.   Elevated LFTs: Alkaline phosphatase elevated. RUQ [place] without any biliary duct dilation. Suspect intrahepatic cholestasis. - continue to monitor   Normocytic anemia:  - follow for any overt signs of bleeding  - conservative transfusion threshold hgb <7  R CEA revision:  - post-op care per vascular sx   Best practice:  Diet: tube feeds  Pain/Anxiety/Delirium protocol (if indicated): per protocol VAP protocol (if indicated): per protocol  DVT prophylaxis: SCDs GI prophylaxis: PPI Glucose control: Lantus + SSI Mobility: Bed rest Code Status: FULL Family Communication: family to be updated   Disposition: ICU  This patient is critically ill with multiple organ system failure; which, requires frequent high complexity decision making,  assessment, support, evaluation, and titration of therapies. This was completed through the application of advanced monitoring technologies and extensive interpretation of multiple databases. During this encounter critical care time was devoted to patient care services described in this note for 35 minutes.  [name], MD [facility] 12/24/2019 10:42 AM

## 2019-12-25 LAB — RENAL FUNCTION PANEL
Albumin: 2.7 g/dL — ABNORMAL LOW (ref 3.5–5.0)
Anion gap: 17 — ABNORMAL HIGH (ref 5–15)
BUN: 154 mg/dL — ABNORMAL HIGH (ref 8–23)
CO2: 27 mmol/L (ref 22–32)
Calcium: 9 mg/dL (ref 8.9–10.3)
Chloride: 93 mmol/L — ABNORMAL LOW (ref 98–111)
Creatinine, Ser: 3.58 mg/dL — ABNORMAL HIGH (ref 0.61–1.24)
GFR calc Af Amer: 18 mL/min — ABNORMAL LOW (ref >60)
GFR calc non Af Amer: 15 mL/min — ABNORMAL LOW (ref >60)
Glucose, Bld: 85 mg/dL (ref 70–99)
Phosphorus: 8.3 mg/dL — ABNORMAL HIGH (ref 2.5–4.6)
Potassium: 4.2 mmol/L (ref 3.5–5.1)
Sodium: 137 mmol/L (ref 135–145)

## 2019-12-25 LAB — GLUCOSE, CAPILLARY
Glucose-Capillary: 92 mg/dL (ref 70–99)
Glucose-Capillary: 77 mg/dL (ref 70–99)
Glucose-Capillary: 125 mg/dL — ABNORMAL HIGH (ref 70–99)
Glucose-Capillary: 82 mg/dL (ref 70–99)
Glucose-Capillary: 53 mg/dL — ABNORMAL LOW (ref 70–99)

## 2019-12-25 MED ORDER — ALBUMIN HUMAN 25 % IV SOLN
25.0000 g | Freq: Four times a day (QID) | INTRAVENOUS | Status: AC
  Administered 2019-12-25: 12.5 g via INTRAVENOUS
  Administered 2019-12-25: 25 g via INTRAVENOUS
  Administered 2019-12-25: 12.5 g via INTRAVENOUS
  Filled 2019-12-25 (×2): qty 100

## 2019-12-25 MED ORDER — MODAFINIL 100 MG PO TABS
100.0000 mg | ORAL_TABLET | Freq: Every day | ORAL | Status: DC
  Administered 2019-12-25 – 2019-12-28 (×4): 100 mg
  Filled 2019-12-25 (×4): qty 1

## 2019-12-25 MED ORDER — MELATONIN 3 MG PO TABS
3.0000 mg | ORAL_TABLET | Freq: Every day | ORAL | Status: DC
  Administered 2019-12-25 – 2019-12-31 (×7): 3 mg
  Filled 2019-12-25 (×8): qty 1

## 2019-12-25 MED ORDER — MELATONIN 3 MG PO TABS: 3.0000 mg | ORAL_TABLET | Freq: Every day | ORAL | Status: DC

## 2019-12-25 MED ORDER — DEXTROSE 50 % IV SOLN
INTRAVENOUS | Status: AC
  Administered 2019-12-25: 50 mL
  Filled 2019-12-25: qty 50

## 2019-12-25 MED ORDER — MODAFINIL 100 MG PO TABS: 100.0000 mg | ORAL_TABLET | Freq: Every day | ORAL | Status: DC

## 2019-12-25 MED ORDER — FUROSEMIDE 10 MG/ML IJ SOLN
80.0000 mg | Freq: Every day | INTRAMUSCULAR | Status: DC
  Administered 2019-12-26: 80 mg via INTRAVENOUS
  Filled 2019-12-25: qty 8

## 2019-12-25 MED ORDER — FUROSEMIDE 10 MG/ML IJ SOLN: 80.0000 mg | Freq: Two times a day (BID) | INTRAMUSCULAR | Status: DC

## 2019-12-25 MED ORDER — SENNOSIDES-DOCUSATE SODIUM 8.6-50 MG PO TABS
2.0000 | ORAL_TABLET | Freq: Every day | ORAL | Status: DC
  Administered 2019-12-29 – 2019-12-30 (×2): 2 via ORAL
  Filled 2019-12-25 (×5): qty 2

## 2019-12-25 MED ORDER — POLYETHYLENE GLYCOL 3350 17 G PO PACK
17.0000 g | PACK | Freq: Every day | ORAL | Status: DC | PRN
  Filled 2019-12-25: qty 1

## 2019-12-25 NOTE — Progress Notes (Signed)
Daily Progress Note   Patient Name: Kelly Williams       Date: 12/25/2019 DOB: [birth year]  Age: 79 y.o. MRN#: [id number] Attending Physician: Elam Dutch, MD Primary Care Physician: Luetta Nutting, DO Admit Date: 12/07/2019  Reason for Consultation/Follow-up: Establishing goals of care  Subjective/GOC: Patient awake, alert. Will intermittently follow commands. Did not tolerate trach collar wean for long with tachypnea and tachycardia.  Spoke with daughter, Kelly Williams via telephone to discuss goals of care.   Again introduced role of palliative medicine.   The patient has been married and divorced x3. Kelly Williams has one half-sister but her sister only communicates around the holidays and suffers from mental illness. Kelly Williams's sister is not supportive and not involved in the care of Kelly Williams. Prior to hospitalization, Kelly Williams and Kelly Williams lived together.   Discussed in detail course of hospitalization including diagnoses, interventions, plan of care. Kelly Williams is appreciative of conversations with providers. She was able to discuss his kidney status in detail with Dr. Carolin Sicks this morning. Kelly Williams acknowledges that nephrology is worried about her father's kidney function and not recommending even short-term trial of dialysis because 'once on the dialysis train, can't get off.' We discussed challenging disposition especially if he continues to require ventilator support and if dialysis was necessary. Kelly Williams does seem to understand he is a poor long-term candidate for dialysis. We discussed plan for ongoing aggressive diuresis, watchful waiting for renal recovery. She remains hopeful that this is reversible.   Kelly. Rentz does not have a documented living will and has only told Kelly Williams that  if he was in a coma, he would not desire prolonging measures. This is not the current situation, as she feels he somewhat understands his condition but challenging to communicate with trach/vent. Kelly Williams brought the tablet for means of communication but Kelly Williams's glasses are lost. She will try to get another pair of glasses in order to try and communicate with him via tablet. Kelly Williams is also hopeful that he will be able to wean on trach collar and use PMV for communicating his wishes.   Kelly Williams would like to complete HCPOA documentation this admission if possible/if her father can wean on trach collar and communicate via [place]. Reassured of ongoing palliative f/u to assist with this if possible.   Again discussed watchful waiting, time  for outcomes. PMT contact information given.    Length of Stay: 21  Current Medications: Scheduled Meds:  . sodium chloride   Intravenous Once  . atorvastatin  80 mg Per Tube QHS  . bethanechol  10 mg Per Tube TID  . budesonide (PULMICORT) nebulizer solution  0.5 mg Nebulization BID  . chlorhexidine gluconate (MEDLINE KIT)  15 mL Mouth Rinse BID  . Chlorhexidine Gluconate Cloth  6 each Topical Q0600  . feeding supplement (PROSource TF)  45 mL Per Tube TID  . [START ON 12/26/2019] furosemide  80 mg Intravenous Daily  . heparin injection (subcutaneous)  5,000 Units Subcutaneous Q8H  . insulin aspart  0-15 Units Subcutaneous Q4H  . insulin glargine  15 Units Subcutaneous Daily  . ipratropium-albuterol  3 mL Nebulization TID  . mouth rinse  15 mL Mouth Rinse 10 times per day  . melatonin  3 mg Per Tube QHS  . modafinil  100 mg Per Tube QAC breakfast  . multivitamin with minerals  1 tablet Per Tube Daily  . pantoprazole sodium  40 mg Per Tube QHS  . senna-docusate  2 tablet Oral QHS    Continuous Infusions: . sodium chloride Stopped (12/23/19 1248)  . albumin human 25 g (12/25/19 1232)  . feeding supplement (JEVITY 1.2 CAL) 1,000 mL (12/25/19 0949)  .  ferumoxytol 510 mg (12/23/19 0917)    PRN Meds: sodium chloride, acetaminophen **OR** acetaminophen, fentaNYL (SUBLIMAZE) injection, fentaNYL (SUBLIMAZE) injection, hydrALAZINE, ipratropium-albuterol, metoprolol tartrate, ondansetron (ZOFRAN) IV, phenol, polyethylene glycol, potassium chloride  Physical Exam Vitals and nursing note reviewed.   Unable to perform PE. Phone conference.  Vital Signs: BP (!) 144/87 (BP Location: Right Arm)   Pulse (!) 101   Temp 98 F (36.7 C) (Axillary)   Resp (!) 28   Ht 5' 6"  (1.676 m)   Wt 77 kg   SpO2 99%   BMI 27.40 kg/m  SpO2: SpO2: 99 % O2 Device: O2 Device: Tracheostomy Collar O2 Flow Rate: O2 Flow Rate (L/min): 15 L/min  Intake/output summary:   Intake/Output Summary (Last 24 hours) at 12/25/2019 1333 Last data filed at 12/25/2019 1200 Gross per 24 hour  Intake 301.2 ml  Output 2100 ml  Net -1798.8 ml   LBM: Last BM Date: 12/25/19 Baseline Weight: Weight: 63.5 kg Most recent weight: Weight: 77 kg       Palliative Assessment/Data: PPS 30%      Patient Active Problem List   Diagnosis Date Noted  . Acute kidney injury ([place])   . Palliative care by specialist   . Goals of care, counseling/discussion   . Acute respiratory distress   . Aspiration into airway   . Protein-calorie malnutrition, severe 12/10/2019  . Acute hypoxemic respiratory failure ([place])   . Carotid artery stenosis 12/31/2019  . Endotracheal tube present   . Encephalopathy acute   . Acute CVA (cerebrovascular accident) ([place]) 11/27/2019  . CVA (cerebral vascular accident) ([place]) 11/26/2019  . Unintentional weight loss 08/24/2019  . History of CVA (cerebrovascular accident) 06/25/2019  . Bruit 06/25/2019  . Dizziness 06/14/2019  . Essential hypertension 06/14/2019  . Injury of left shoulder 05/30/2019  . Rotator cuff tear, right 05/30/2019  . Loose stools 02/04/2019  . Gait disturbance 02/04/2019  . Cervical spondylosis 01/17/2019  . Normocytic anemia  01/16/2019  . Trigger finger, right ring finger 10/27/2018  . Chronic kidney disease 06/29/2018  . At moderate risk for fall 06/29/2018  . Ambulates with cane 06/29/2018  . Memory difficulties 06/02/2018  .  Diabetic autonomic neuropathy associated with type 2 diabetes mellitus ([place]) 06/02/2018  . Status post lumbar spinal fusion 06/02/2018  . S/P CABG x 5 06/02/2018  . COPD with chronic bronchitis and emphysema ([place]) 06/02/2018  . Shortness of breath 06/02/2018  . Diabetic nephropathy associated with type 2 diabetes mellitus ([place]) 06/02/2018  . Type 2 diabetes mellitus with diabetic neuropathy, unspecified ([place]) 06/02/2018  . Tachycardia with heart rate 100-120 beats per minute 06/02/2018  . Acromioclavicular arthrosis 06/02/2018  . Diabetic retinopathy ([place])   . Coronary artery disease   . Carotid arterial disease [facility])     Palliative Care Assessment & Plan   Patient Profile: 79 y.o. male  with past medical history of stroke, hypertension, hyperlipidemia, diabetic retinopathy, DM, CAD, s/p CABG, COPD, CKD, arthritis, ETOH use admitted on 12/28/2019 with history of recurrent right ICA occlusion s/p CEA. Developed respiratory failure requiring intubation. Patient self-extubated 8/5, again extubated 8/7. On 8/11 AM, patient became unresponsive and hypoxemic requiring re-intubation and transfer to ICU. Trach placed 8/21. PT/OT following. Likely will need LTACH placement. Patient now with worsening AKI on CKD, possibly cardiorenal syndrome. Nephrology consulted. Patient receiving high-dose IV diuretics but worsening creatinine. May requiring dialysis and poor candidate for long-term dialysis with underlying chronic conditions and current condition with tracheostomy/vent dependency. Palliative medicine consultation for goals of care.   Assessment: Acute respiratory failure s/p trach/vent AKI on CKD Recurrent right ICA occlusion s/p CEA revision Anemia  Deconditioning    Recommendations/Plan:  Continue full code/full scope treatment.   Ongoing palliative discussions pending clinical course. Watchful waiting and time for outcomes/renal recovery.   Daughter feels her father can communicate his wishes via tablet but visual impairment with macular degeneration and his glasses are missing. She will try and bring another pair to the hospital. Daughter hopeful he will tolerate trach collar trials in the next few days in order to attempt communication with PMV.  Daughter hopeful to complete HCPOA paperwork this admission.   PMT provider will continue to follow.   Goals of Care and Additional Recommendations:  Limitations on Scope of Treatment: Full Scope Treatment  Code Status: FULL   Code Status Orders  (From admission, onward)         Start     Ordered   12/07/2019 1437  Full code  Continuous        12/10/2019 1436        Code Status History    Date Active Date Inactive Code Status Order ID Comments User Context   11/26/2019 1301 11/29/2019 2326 Full Code [id number]  Guilford Shi, MD ED   Advance Care Planning Activity       Prognosis:  Guarded  Discharge Planning:  To Be Determined  Care plan was discussed with RN, daughter Kelly Williams)  Thank you for allowing the Palliative Medicine Team to assist in the care of this patient.   Total Time 60 Prolonged Time Billed no      Greater than 50%  of this time was spent counseling and coordinating care related to the above assessment and plan.  Ihor Dow, DNP, FNP-C Palliative Medicine Team  Phone: [phone] Fax: [phone]  Please contact Palliative Medicine Team phone at [phone] for questions and concerns.

## 2019-12-25 NOTE — Progress Notes (Addendum)
VASCULAR SURGERY ASSESSMENT & PLAN:    Carotid stenosis: s/p re-do right CEA 8/3. Neuro: intact  Respiratory failure: Post -op aspiration now POD 3 tracheostomy. FiO2 40%; PEEP 5 Right pleural effusion. RT at bedside>weaning underway  Fever spike yesterday 101.2 at 4pm. No further fever. WBC 11.9  AKI atop CKD: diuresis>Lasix infusion. SCr continues to rise. Almost 3L UOP last 24 hours.  GI: vomiting yesterday. TFs on hold  Anemia: multifactorial. Hgb down to 7.6g/dL. Last transfused 8/16 x one unit PCs   Pt following commands.  Currently on trach collar.  Tube feeds to restart tdoay. Plan d/w Critical care service, Dr Lynetta Mare.  Pt primary obstacles right now are related to respiratory and renal failure.   Hopefully both of this situations are potentially reversible.  Pt was only trached 4 days ago so I think we need to give him an effort and getting off vent and trach would only be temporary.  Obviously this will be a lengthy process  Additionally, he has non oliguric renal failure and is responding to lasix and frequently this is a reversible process.  I agree that permanent dialysis may not be the best scenario for him but if temporary this may also be a bridge to returning to some quality of life.  Daughter updated by phone.  Ruta Hinds, MD [facility] Office: [phone]    SUBJECTIVE:   Awake and on vent. Follows commands  PHYSICAL EXAM:   Vitals:   12/25/19 0400 12/25/19 0500 12/25/19 0600 12/25/19 0700  BP: 133/70 140/71 (!) 135/47 (!) 145/86  Pulse: 93 90 92 99  Resp: 18 18 (!) 23 (!) 28  Temp: (!) 97.2 F (36.2 C)     TempSrc: Axillary     SpO2: 97% 98% 96% 91%  Weight:  77 kg    Height:       HEENT: trach site with dried blood; no active bleeding. Neck incision continues to heal. Cardiac: RRR Lungs: decreased BS; no wheezing Abd: soft, ND Extremities: 5/5 bil grip strength. Moves LE well   LABS:   Lab  Results  Component Value Date   WBC 11.9 (H) 12/11/2019   HGB 7.6 (L) 12/03/2019   HCT 23.4 (L) 12/13/2019   MCV 94.7 12/21/2019   PLT 521 (H) 12/29/2019   Lab Results  Component Value Date   CREATININE 3.58 (H) 12/25/2019   Lab Results  Component Value Date   INR 1.2 01/01/2020   CBG (last 3)  Recent Labs    12/24/19 1934 12/24/19 2320 12/25/19 0317  GLUCAP 98 91 92    PROBLEM LIST:    Active Problems:   COPD with chronic bronchitis and emphysema (HCC)   Chronic kidney disease   Carotid artery stenosis   Endotracheal tube present   Encephalopathy acute   Acute hypoxemic respiratory failure (HCC)   Protein-calorie malnutrition, severe   Aspiration into airway   Acute respiratory distress   Acute kidney injury ([place])   Palliative care by specialist   Goals of care, counseling/discussion   CURRENT MEDS:   . sodium chloride   Intravenous Once  . atorvastatin  80 mg Per Tube QHS  . bethanechol  10 mg Per Tube TID  . budesonide (PULMICORT) nebulizer solution  0.5 mg Nebulization BID  . chlorhexidine gluconate (MEDLINE KIT)  15 mL Mouth Rinse BID  . Chlorhexidine Gluconate Cloth  6 each Topical Q0600  . docusate  100 mg Per Tube Daily  . feeding  supplement (PROSource TF)  45 mL Per Tube TID  . heparin injection (subcutaneous)  5,000 Units Subcutaneous Q8H  . insulin aspart  0-15 Units Subcutaneous Q4H  . insulin glargine  15 Units Subcutaneous Daily  . ipratropium-albuterol  3 mL Nebulization TID  . mouth rinse  15 mL Mouth Rinse 10 times per day  . metolazone  5 mg Per Tube 2 times per day  . multivitamin with minerals  1 tablet Per Tube Daily  . pantoprazole sodium  40 mg Per Tube QHS  . polyethylene glycol  17 g Per Tube Daily    Barbie Banner, [place] Office: [phone] 12/25/2019

## 2019-12-25 NOTE — Progress Notes (Signed)
[facility] NEPHROLOGY PROGRESS NOTE  Assessment/ Plan: Pt is a 79 y.o. yo male  medical history significant for alcohol use disorder, PAD, COPD CAD, DM 2, HLD, HTN, CVAwho present w/endarterectomy revision complicated by ventilatory dependence and AKI  # Severe nonoliguric AKI on CKD 3:  Likely due to cardiorenal syndrome.  Renal ultrasound without obstruction.  Complicated hospital course with fluctuation in serum creatinine level.  Urine output increased with IV Lasix drip however with worsening BUN and creatinine level.  The edema looks much better today.  I will hold off on Lasix today and order 3 doses of IV albumin.  Next dose of Lasix tomorrow morning. I am concerned that he might be heading towards requiring dialysis which may complicates his further hospital course and discharge planning.  He has tracheostomy and multiple comorbidity.  Seen by palliative care consult. I have discussed and reviewed this in detail with his daughter who is a Marine scientist in cardiology office.  I explained to her that the dialysis will not increase the quality of life.  #HTN/Volume Status: Still volume up however looks much better.  Holding Lasix today as above.  Continue metoprolol  #Hypoxic respiratory failure/ventilatory dependence: Management per primary team.  He has tracheostomy and on vent.  #Anemia multifactorial including kidney disease, chronic illness: Continue to monitor.  Ferritin 325.  Iron saturation of 12.  Feraheme x2 doses.  Monitor hemoglobin  # Hyponatremia hypervolemic: Serum sodium level improved with IV loop diuretics.  #Hyperkalemia: Resolved.   Discussed with ICU nurse and patient's daughter.  Subjective: Seen and examined.  Urine output 2.9 L with Lasix drip.  Extremities edema much better however he still has some scrotal swelling.  Remains on vent.  Alert awake.  His daughter at bedside.  Plan for palliative care meeting today.  Objective Vital signs in  last 24 hours: Vitals:   12/25/19 0829 12/25/19 0900 12/25/19 1000 12/25/19 1100  BP:  (!) 141/78 (!) 146/81 (!) 147/70  Pulse: 100 100 95 99  Resp: _0 (!) 25  Temp:      TempSrc:      SpO2: 96% 96% 94% 92%  Weight:      Height:       Weight change:   Intake/Output Summary (Last 24 hours) at 12/25/2019 1145 Last data filed at 12/25/2019 1100 Gross per 24 hour  Intake 186.26 ml  Output 2100 ml  Net -1913.74 ml       Labs: Basic Metabolic Panel: Recent Labs  Lab 12/23/19 0441 12/24/19 0616 12/25/19 0632  NA 133* 137 137  K 4.8 4.9 4.2  CL 94* 95* 93*  CO2 _1 GLUCOSE 198* 97 85  BUN 134* 142* 154*  CREATININE 2.95* 3.16* 3.58*  CALCIUM 8.5* 8.8* 9.0  PHOS 7.7* 7.9* 8.3*   Liver Function Tests: Recent Labs  Lab 12/20/19 0905 12/20/19 0905 12/21/19 0617 12/21/19 0617 12/09/2019 0453 12/26/2019 0453 12/23/19 0441 12/24/19 0616 12/25/19 0632  AST 65*  --  73*  --  82*  --   --   --   --   ALT 32  --  33  --  34  --   --   --   --   ALKPHOS 374*  --  393*  --  340*  --   --   --   --   BILITOT 1.2  --  1.4*  --  1.5*  --   --   --   --  PROT 7.2  --  6.5  --  6.5  --   --   --   --   ALBUMIN 2.9*   < > 3.1*   < > 3.0*   < > 2.8* 2.6* 2.7*   < > = values in this interval not displayed.   No results for input(s): LIPASE, AMYLASE in the last 168 hours. No results for input(s): AMMONIA in the last 168 hours. CBC: Recent Labs  Lab 12/19/19 0905 12/19/19 0905 12/20/19 0905 12/20/19 1406 12/21/19 0617 12/21/19 1206 12/24/2019 0453  WBC 13.5*   < > 12.2*  --  10.0  --  11.9*  HGB 8.5*   < > 7.9*   < > 7.8* 8.2* 7.6*  HCT 26.8*   < > 24.5*   < > 24.3* 24.0* 23.4*  MCV 94.4  --  95.3  --  95.3  --  94.7  PLT 529*   < > 566*  --  523*  --  521*   < > = values in this interval not displayed.   Cardiac Enzymes: No results for input(s): CKTOTAL, CKMB, CKMBINDEX, TROPONINI in the last 168 hours. CBG: Recent Labs  Lab 12/24/19 1526 12/24/19 1934  12/24/19 2320 12/25/19 0317 12/25/19 0811  GLUCAP 108* 98 91 92 77    Iron Studies:  Recent Labs    12/15/2019 1700  IRON 22*  TIBC 179*  FERRITIN 325   Studies/Results: DG Abd 1 View  Result Date: 12/23/2019 CLINICAL DATA:  Tube placement. EXAM: ABDOMEN - 1 VIEW COMPARISON:  December 05, 2019 FINDINGS: A nasogastric tube is seen with its distal tip overlying the expected region of the gastric antrum. Multiple sternal wires and vascular clips are seen within the visualized portion of the chest. Marked severity diffuse bilateral infiltrates are also noted. The bowel gas pattern is normal. No radio-opaque calculi or other significant radiographic abnormality are seen. IMPRESSION: Nasogastric tube positioning, as described above. Electronically Signed   By: Virgina Norfolk M.D.   On: 12/23/2019 23:44   DG Chest Port 1 View  Result Date: 12/23/2019 CLINICAL DATA:  Hypoxia EXAM: PORTABLE CHEST 1 VIEW COMPARISON:  12/21/2019 FINDINGS: Tracheostomy tube tip is at the level of the clavicular heads. Esophageal tube courses below the field of view. Remote median sternotomy. Intermediate sized right pleural effusion with basilar consolidation. IMPRESSION: Intermediate sized right pleural effusion and basilar consolidation. Electronically Signed   By: Ulyses Jarred M.D.   On: 12/23/2019 23:44    Medications: Infusions: . sodium chloride Stopped (12/23/19 1248)  . albumin human    . feeding supplement (JEVITY 1.2 CAL) 1,000 mL (12/25/19 0949)  . ferumoxytol 510 mg (12/23/19 0917)    Scheduled Medications: . sodium chloride   Intravenous Once  . atorvastatin  80 mg Per Tube QHS  . bethanechol  10 mg Per Tube TID  . budesonide (PULMICORT) nebulizer solution  0.5 mg Nebulization BID  . chlorhexidine gluconate (MEDLINE KIT)  15 mL Mouth Rinse BID  . Chlorhexidine Gluconate Cloth  6 each Topical Q0600  . feeding supplement (PROSource TF)  45 mL Per Tube TID  . [START ON 12/26/2019] furosemide  80  mg Intravenous Daily  . heparin injection (subcutaneous)  5,000 Units Subcutaneous Q8H  . insulin aspart  0-15 Units Subcutaneous Q4H  . insulin glargine  15 Units Subcutaneous Daily  . ipratropium-albuterol  3 mL Nebulization TID  . mouth rinse  15 mL Mouth Rinse 10 times per day  . melatonin  3 mg Per Tube QHS  . modafinil  100 mg Per Tube QAC breakfast  . multivitamin with minerals  1 tablet Per Tube Daily  . pantoprazole sodium  40 mg Per Tube QHS  . senna-docusate  2 tablet Oral QHS    have reviewed scheduled and prn medications.  Physical Exam: General:NAD, comfortable, has tracheostomy, alert awake. Heart:RRR, s1s2 nl Lungs: Bibasal rhonchi Abdomen:soft, Non-tender, non-distended Extremities: Extremities edema much better. Dialysis Access: None  Jason Hauge Tanna Furry 12/25/2019,11:45 AM  LOS: 21 days  Pager: [phone]

## 2019-12-25 NOTE — Progress Notes (Signed)
Subjective: On vent via trach. No trach issues overnight.  Objective: Vital signs in last 24 hours: Temp:  [97.2 F (36.2 C)-101.2 F (38.4 C)] 97.5 F (36.4 C) (08/24 0800) Pulse Rate:  [80-100] 95 (08/24 1000) Resp:  [12-28] 14 (08/24 1000) BP: (123-158)/(47-86) 146/81 (08/24 1000) SpO2:  [91 %-100 %] 94 % (08/24 1000) FiO2 (%):  [40 %] 40 % (08/24 0829) Weight:  [77 kg] 77 kg (08/24 0500)  Physical Exam: Head: NCAT. No lesion. Ears: Examination of the ears shows normal auricles and external auditory canals bilaterally.  Nose: Nasal examination shows normal mucosa, septum, turbinates.  Face: Facial examination shows no asymmetry.  Mouth: Oral cavity examination shows no mucosal lacerations. Neck:Right neck incision is c/d/i.The trach tube is in place and venting well. Neuro:On vent. Not responsive.  No results for input(s): WBC, HGB, HCT, PLT in the last 72 hours. Recent Labs    12/24/19 0616 12/25/19 0632  NA 137 137  K 4.9 4.2  CL 95* 93*  CO2 25 27  GLUCOSE 97 85  BUN 142* 154*  CREATININE 3.16* 3.58*  CALCIUM 8.8* 9.0    Medications:  I have reviewed the patient's current medications. Scheduled: . sodium chloride   Intravenous Once  . atorvastatin  80 mg Per Tube QHS  . bethanechol  10 mg Per Tube TID  . budesonide (PULMICORT) nebulizer solution  0.5 mg Nebulization BID  . chlorhexidine gluconate (MEDLINE KIT)  15 mL Mouth Rinse BID  . Chlorhexidine Gluconate Cloth  6 each Topical Q0600  . feeding supplement (PROSource TF)  45 mL Per Tube TID  . heparin injection (subcutaneous)  5,000 Units Subcutaneous Q8H  . insulin aspart  0-15 Units Subcutaneous Q4H  . insulin glargine  15 Units Subcutaneous Daily  . ipratropium-albuterol  3 mL Nebulization TID  . mouth rinse  15 mL Mouth Rinse 10 times per day  . melatonin  3 mg Per Tube QHS  . modafinil  100 mg Per Tube QAC breakfast  . multivitamin with minerals  1 tablet Per Tube Daily  . pantoprazole sodium   40 mg Per Tube QHS   Continuous: . sodium chloride Stopped (12/23/19 1248)  . feeding supplement (JEVITY 1.2 CAL) 1,000 mL (12/25/19 0949)  . ferumoxytol 510 mg (12/23/19 0917)  . furosemide (LASIX) infusion 8 mg/hr (12/25/19 0900)    Assessment/Plan: POD #3 s/p trach. - Routine fresh trach care. - Wean vent as tolerated. - Will change trach early next week.   LOS: 21 days   Ephram Kornegay W Yana Schorr 12/25/2019, 10:20 AM

## 2019-12-25 NOTE — Progress Notes (Signed)
SLP Cancellation Note  Patient Details Name: Kelly Williams MRN: [id number] DOB: Nov 29, 1940   Cancelled treatment:       Reason Eval/Treat Not Completed: Medical issues which prohibited therapy. Pt was on TC upon SLP arrival but RR and HR elevated. RT switched him back to the vent after ~45 min off. Will f/u as able for PMV evaluation.    Osie Bond., M.A. [facility] Pager [phone] Office [phone]  12/25/2019, 12:39 PM

## 2019-12-25 NOTE — Progress Notes (Signed)
NAME:  Kelly Williams, MRN:  [id number], DOB:  10-17-1940, LOS: 21 ADMISSION DATE:  12/02/2019, CONSULTATION DATE:  12/12/2019 REFERRING MD:  Oneida Alar, CHIEF COMPLAINT:  Vent management for acute respiratory failure   Brief History   79 year old male with history of recurrent R ICA occlusion post-CEA who developed respiratory failure requiring reintubation and pneumonia. He initially failed self extubation 8/5 after 24 hours off vent, and was again extubated 8/7 with seemingly good toleration. Then in the early am hours of 8/11, several hours after self-removal of Cortrak, he became unresponsive and hypoxemic requiring reintubation and transfer to ICU.  Patient is awaiting trach placement.  Past Medical History   Past Medical History:  Diagnosis Date  . Alcohol abuse   . Alcohol abuse   . Arthritis   . Carotid arterial disease ([place])   . Chronic kidney disease    CKD-patient denies  . COPD (chronic obstructive pulmonary disease) (HCC)    uses inhalers  . Coronary artery disease   . Diabetes mellitus without complication ([place])   . Diabetic retinopathy ([place])   . Hyperlipidemia   . Hypertension   . Normocytic anemia 01/16/2019  . Stroke [facility])    around 2020, 2 after bypass in 2015   Significant Hospital Events   8/3: admitted to [facility], CEA, PCCM consulted for vent management 8/4: self extubated 8/5: Re-intubated overnight 8/7: Extubated 8/11: Reintubation   Consults:  Neurology   Procedures:  8/3: ETT 8/3: R CEA 8/3: Exploration of R CEA and R ICA thrombectomy 8/3: R art line   Significant Diagnostic Tests:  Carotid duplex 8/3 > right ICA occlusion CTA Head/Neck 8/3 > No acute intracranial abnormality. Post repeat right carotid endarterectomy. Decreased irregularity of the proximal cervical ICA. 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 Persistent narrowing and irregularity of the distal ICA with increased eccentric intraluminal  filling defect, which may reflect unstable/migrated clot. 60-70% stenosis of the proximal left ICA. No proximal intracranial vessel occlusion. Stable findings of multifocal intracranial atherosclerosis. MRI Brain wo Contrast 8/3 > 1. Unchanged appearance of acute right MCA territory infarct. No new site of acute ischemia. 2. Unchanged generalized atrophy and findings of chronic ischemic Microangiopathy. MR Brain wo Contrast 8/6 > Unchanged appearance of a subacute right MCA territory infarct centered within the right frontal operculum as compared to the MRI of 12/03/2019. No significant mass effect or midline shift. Expected evolution of a known subacute infarct within the right parietal lobe. No interval acute infarct is demonstrated. Stable background generalized parenchymal atrophy and chronic ischemic changes with multiple chronic infarcts as detailed. CT Head wo Contrast 8/11 > 1. Stable appearance of subacute and chronic right MCA infarcts. 2. Stable remote infarcts of the high right frontal lobe and left basal ganglia. 3. No acute intracranial abnormality or significant interval change.  Micro Data:  MRSA 8/3 Negative Respiratory Cx 8/5 > Negative  Blood Cx 8/11 > Negative  Respiratory Cx 8/13 > pansensitive pseudomonas MRSA 8/16 Negative  Antimicrobials:  Cefazolin 8/3 Unasyn 8/5 > 8/12 Zosyn 8/12> 8/16 Ceftazidime 8/16> 8/17  Interim history/subjective:   Bleeding from around tracheostomy.   Objective   Blood pressure (!) 145/86, pulse 100, temperature (!) 97.2 F (36.2 C), temperature source Axillary, resp. rate 20, height 5\' 6"  (1.676 m), weight 77 kg, SpO2 96 %.    Vent Mode: CPAP;PSV FiO2 (%):  [40 %] 40 % Set Rate:  [18 bmp] 18 bmp Vt Set:  [  510 mL] 510 mL PEEP:  [5 cmH20] 5 cmH20 Pressure Support:  [12 cmH20-14 cmH20] 12 cmH20 Plateau Pressure:  [21 cmH20-24 cmH20] 24 cmH20   Intake/Output Summary (Last 24 hours) at 12/25/2019 0839 Last data filed at 12/25/2019  0600 Gross per 24 hour  Intake 138.25 ml  Output 2525 ml  Net -2386.75 ml   Filed Weights   12/21/19 0500 01/01/2020 0500 12/25/19 0500  Weight: 79.2 kg 77 kg 77 kg   Examination: General: elderly frail gentleman, trach, on vent  HENT: NCAT, sclera clear, tracheostomy with clot around stoma. No active bleeding.  Lungs: tolerating SBT. Inspiratory wheezing.  Cardiovascular: RRR, s1 s2, capillary refill <2sec.  Abdomen: soft, nt nd  Extremities: generalized edema.  Neuro: somnolent and will follow commands intermittently.    Assessment & Plan:   Critically ill due to acute respiratory failure s/p multiple failed extubations, now trach and vent dependent  - continue routine trach care  - open ended trach collar trial   AKI on CKD - has good urine output with diuresis as this time continue to diurese as remains edematous.  - discussed with nephrology: long-term HD not advisable given stroke/tracheostomy.   Elevated LFTs: Alkaline phosphatase elevated. RUQ [place] without any biliary duct dilation. Suspect intrahepatic cholestasis. - continue to monitor   Normocytic anemia:  - follow for any overt signs of bleeding  - conservative transfusion threshold hgb <7  R CEA revision:  - post-op care per vascular sx   Best practice:  Diet: tube feeds  Pain/Anxiety/Delirium protocol (if indicated): per protocol VAP protocol (if indicated): per protocol  DVT prophylaxis: SCDs GI prophylaxis: PPI Glucose control: Lantus + SSI Mobility: Bed rest Code Status: FULL Family Communication: family to be updated   Disposition: ICU  This patient is critically ill with multiple organ system failure; which, requires frequent high complexity decision making, assessment, support, evaluation, and titration of therapies. This was completed through the application of advanced monitoring technologies and extensive interpretation of multiple databases. During this encounter critical care time was devoted  to patient care services described in this note for 35 minutes.  [name], MD [place] [facility] 12/25/2019 8:39 AM

## 2019-12-26 LAB — RENAL FUNCTION PANEL
Albumin: 3 g/dL — ABNORMAL LOW (ref 3.5–5.0)
Anion gap: 17 — ABNORMAL HIGH (ref 5–15)
BUN: 162 mg/dL — ABNORMAL HIGH (ref 8–23)
CO2: 28 mmol/L (ref 22–32)
Calcium: 8.5 mg/dL — ABNORMAL LOW (ref 8.9–10.3)
Chloride: 91 mmol/L — ABNORMAL LOW (ref 98–111)
Creatinine, Ser: 3.63 mg/dL — ABNORMAL HIGH (ref 0.61–1.24)
GFR calc Af Amer: 17 mL/min — ABNORMAL LOW (ref >60)
GFR calc non Af Amer: 15 mL/min — ABNORMAL LOW (ref >60)
Glucose, Bld: 234 mg/dL — ABNORMAL HIGH (ref 70–99)
Phosphorus: 7.7 mg/dL — ABNORMAL HIGH (ref 2.5–4.6)
Potassium: 3.6 mmol/L (ref 3.5–5.1)
Sodium: 136 mmol/L (ref 135–145)

## 2019-12-26 LAB — GLUCOSE, CAPILLARY
Glucose-Capillary: 150 mg/dL — ABNORMAL HIGH (ref 70–99)
Glucose-Capillary: 235 mg/dL — ABNORMAL HIGH (ref 70–99)
Glucose-Capillary: 154 mg/dL — ABNORMAL HIGH (ref 70–99)
Glucose-Capillary: 149 mg/dL — ABNORMAL HIGH (ref 70–99)
Glucose-Capillary: 132 mg/dL — ABNORMAL HIGH (ref 70–99)

## 2019-12-26 MED ORDER — CHLORHEXIDINE GLUCONATE CLOTH 2 % EX PADS
6.0000 | MEDICATED_PAD | Freq: Every day | CUTANEOUS | Status: DC
  Administered 2019-12-27 – 2020-01-04 (×8): 6 via TOPICAL

## 2019-12-26 MED ORDER — JEVITY 1.2 CAL PO LIQD
1000.0000 mL | ORAL | Status: DC
  Administered 2019-12-26 – 2020-01-02 (×10): 1000 mL
  Filled 2019-12-26 (×10): qty 1000

## 2019-12-26 NOTE — Progress Notes (Signed)
NAME:  Kelly Williams, MRN:  [id number], DOB:  Jan 19, 1941, LOS: 38 ADMISSION DATE:  12/30/2019, CONSULTATION DATE:  12/12/2019 REFERRING MD:  Oneida Alar, CHIEF COMPLAINT:  Vent management for acute respiratory failure   Brief History   79 year old male with history of recurrent R ICA occlusion post-CEA who developed respiratory failure requiring reintubation and pneumonia. He initially failed self extubation 8/5 after 24 hours off vent, and was again extubated 8/7 with seemingly good toleration. Then in the early am hours of 8/11, several hours after self-removal of Cortrak, he became unresponsive and hypoxemic requiring reintubation and transfer to ICU.  Patient is awaiting trach placement.  Past Medical History   Past Medical History:  Diagnosis Date  . Alcohol abuse   . Alcohol abuse   . Arthritis   . Carotid arterial disease ([place])   . Chronic kidney disease    CKD-patient denies  . COPD (chronic obstructive pulmonary disease) (HCC)    uses inhalers  . Coronary artery disease   . Diabetes mellitus without complication ([place])   . Diabetic retinopathy ([place])   . Hyperlipidemia   . Hypertension   . Normocytic anemia 01/16/2019  . Stroke [facility])    around 2020, 2 after bypass in 2015   Significant Hospital Events   8/3: admitted to [facility], CEA, PCCM consulted for vent management 8/4: self extubated 8/5: Re-intubated overnight 8/7: Extubated 8/11: Reintubation   Consults:  Neurology   Procedures:  8/3: ETT 8/3: R CEA 8/3: Exploration of R CEA and R ICA thrombectomy 8/3: R art line   Significant Diagnostic Tests:  Carotid duplex 8/3 > right ICA occlusion CTA Head/Neck 8/3 > No acute intracranial abnormality. Post repeat right carotid endarterectomy. Decreased irregularity of the proximal cervical ICA. 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 Persistent narrowing and irregularity of the distal ICA with increased eccentric intraluminal  filling defect, which may reflect unstable/migrated clot. 60-70% stenosis of the proximal left ICA. No proximal intracranial vessel occlusion. Stable findings of multifocal intracranial atherosclerosis. MRI Brain wo Contrast 8/3 > 1. Unchanged appearance of acute right MCA territory infarct. No new site of acute ischemia. 2. Unchanged generalized atrophy and findings of chronic ischemic Microangiopathy. MR Brain wo Contrast 8/6 > Unchanged appearance of a subacute right MCA territory infarct centered within the right frontal operculum as compared to the MRI of 12/21/2019. No significant mass effect or midline shift. Expected evolution of a known subacute infarct within the right parietal lobe. No interval acute infarct is demonstrated. Stable background generalized parenchymal atrophy and chronic ischemic changes with multiple chronic infarcts as detailed. CT Head wo Contrast 8/11 > 1. Stable appearance of subacute and chronic right MCA infarcts. 2. Stable remote infarcts of the high right frontal lobe and left basal ganglia. 3. No acute intracranial abnormality or significant interval change.  Micro Data:  MRSA 8/3 Negative Respiratory Cx 8/5 > Negative  Blood Cx 8/11 > Negative  Respiratory Cx 8/13 > pansensitive pseudomonas MRSA 8/16 Negative  Antimicrobials:  Cefazolin 8/3 Unasyn 8/5 > 8/12 Zosyn 8/12> 8/16 Ceftazidime 8/16> 8/17  Interim history/subjective:   More awake today. Did not tolerate trach collar, possibly due to prolonged time on PSV.   Objective   Blood pressure 118/62, pulse 90, temperature (!) 97.3 F (36.3 C), temperature source Axillary, resp. rate 18, height 5\' 6"  (1.676 m), weight 77 kg, SpO2 97 %.    Vent Mode: PRVC FiO2 (%):  [40 %-60 %] 40 %  Set Rate:  [18 bmp] 18 bmp Vt Set:  [510 mL] 510 mL PEEP:  [5 cmH20] 5 cmH20 Pressure Support:  [12 cmH20] 12 cmH20 Plateau Pressure:  [15 cmH20-25 cmH20] 15 cmH20   Intake/Output Summary (Last 24 hours) at  12/26/2019 1844 Last data filed at 12/26/2019 1600 Gross per 24 hour  Intake 1265.85 ml  Output 930 ml  Net 335.85 ml   Filed Weights   12/21/19 0500 12/19/2019 0500 12/25/19 0500  Weight: 79.2 kg 77 kg 77 kg   Examination: General: elderly frail gentleman, trach, on vent  HENT: NCAT, sclera clear, tracheostomy with clot around stoma. No active bleeding.  Lungs: tolerating SBT. Inspiratory wheezing.  Cardiovascular: RRR, s1 s2, capillary refill <2sec.  Abdomen: soft, nt nd  Extremities: generalized edema.  Neuro: awake and follows commands intermittently.    Assessment & Plan:   Critically ill due to acute respiratory failure s/p multiple failed extubations, now trach and vent dependent  - continue routine trach care  - open ended trach collar trial   AKI on CKD - has good urine output with diuresis as this time continue to diurese as remains edematous.  - discussed with nephrology: long-term HD not advisable given stroke/tracheostomy.   Elevated LFTs: Alkaline phosphatase elevated. RUQ [place] without any biliary duct dilation. Suspect intrahepatic cholestasis. - continue to monitor   Normocytic anemia:  - follow for any overt signs of bleeding  - conservative transfusion threshold hgb <7  R CEA revision:  - post-op care per vascular sx   Best practice:  Diet: tube feeds  Pain/Anxiety/Delirium protocol (if indicated): per protocol VAP protocol (if indicated): per protocol  DVT prophylaxis: SCDs GI prophylaxis: PPI Glucose control: Lantus + SSI Mobility: Bed rest Code Status: FULL Family Communication: family to be updated   Disposition: ICU  This patient is critically ill with multiple organ system failure; which, requires frequent high complexity decision making, assessment, support, evaluation, and titration of therapies. This was completed through the application of advanced monitoring technologies and extensive interpretation of multiple databases. During this  encounter critical care time was devoted to patient care services described in this note for 35 minutes.  [name], MD [place] [facility] 12/26/2019 6:44 PM

## 2019-12-26 NOTE — Progress Notes (Addendum)
  Progress Note    12/26/2019 7:50 AM 4 Days Post-Op  Subjective:  Interactive.  Follows commands.  Alert to place.   Vitals:   12/26/19 0734 12/26/19 0735  BP:    Pulse:    Resp:    Temp:    SpO2: 94% 94%   Physical Exam: Lungs:  Mechanical ventilation Incisions:  r neck incision healing well Extremities:  Moving all extremities well Neurologic: slight grip strength deficit L hand  CBC    Component Value Date/Time   WBC 11.9 (H) 12/11/2019 0453   RBC 2.47 (L) 12/13/2019 0453   HGB 7.6 (L) 12/27/2019 0453   HCT 23.4 (L) 12/30/2019 0453   PLT 521 (H) 12/14/2019 0453   MCV 94.7 12/19/2019 0453   MCH 30.8 12/29/2019 0453   MCHC 32.5 12/05/2019 0453   RDW 14.8 12/03/2019 0453   LYMPHSABS 3.4 11/26/2019 0004   MONOABS 0.9 11/26/2019 0004   EOSABS 0.3 11/26/2019 0004   BASOSABS 0.1 11/26/2019 0004    BMET    Component Value Date/Time   NA 137 12/25/2019 0632   K 4.2 12/25/2019 0632   CL 93 (L) 12/25/2019 0632   CO2 27 12/25/2019 0632   GLUCOSE 85 12/25/2019 0632   BUN 154 (H) 12/25/2019 0632   CREATININE 3.58 (H) 12/25/2019 0632   CREATININE 1.34 (H) 08/24/2019 1114   CALCIUM 9.0 12/25/2019 0632   GFRNONAA 15 (L) 12/25/2019 0632   GFRNONAA 50 (L) 08/24/2019 1114   GFRAA 18 (L) 12/25/2019 0632   GFRAA 58 (L) 08/24/2019 1114    INR    Component Value Date/Time   INR 1.2 12/29/2019 0453     Intake/Output Summary (Last 24 hours) at 12/26/2019 0750 Last data filed at 12/26/2019 0600 Gross per 24 hour  Intake 1311.09 ml  Output 1580 ml  Net -268.91 ml     Assessment/Plan:  79 y.o. male is s/p redo R CEA, post op respiratory failure requiring trach 4 Days Post-Op   Neuro exam remains at baseline R neck incision healing well Nephrology continuing diuresis CCM managing vent Palliative care discussing goals with daughter; full code/continue all treatment measures  [name], PA-C [facility] [phone] 12/26/2019 7:50  AM  Following commands  Seems to want to continue to progress Making urine still Renal following to see if he has some recovery of function I agree he may not be great from chronic HD but may benefit short term if it was a bridge to recovery. Will defer to renal Vent wean on trach collar intermittently hopefully will wean off as gains strength. It will be a long process but currently pt seem willing to participate.  Ruta Hinds, MD [facility] [place] Office: [phone]

## 2019-12-26 NOTE — Progress Notes (Signed)
SLP Cancellation Note  Patient Details Name: Kelly Williams MRN: [id number] DOB: [birth year]   Cancelled treatment:       Reason Eval/Treat Not Completed: Medical issues which prohibited therapy. Attempted to see pt while on TC, but he returned to the vent after approximately an hour off. Will continue to follow.    Osie Bond., M.A. [facility] Pager [phone] Office [phone]  12/26/2019, 2:09 PM

## 2019-12-26 NOTE — Progress Notes (Signed)
[facility] NEPHROLOGY PROGRESS NOTE  Assessment/ Plan: Pt is a 79 y.o. yo male  medical history significant for alcohol use disorder, PAD, COPD CAD, DM 2, HLD, HTN, CVAwho present w/endarterectomy revision complicated by ventilatory dependence and AKI  # Severe nonoliguric AKI on CKD 3:  Likely due to cardiorenal syndrome.  Renal ultrasound without obstruction.  Complicated hospital course with fluctuation in serum creatinine level.  Urine output decent with IV diuretics.  Edema is much better but he still has lower extremity edema.  Both BUN and creatinine level continue to go up.  He received a dose of Lasix today and had 3 doses of IV albumin.  Plan to monitor renal function, hold further Lasix until tomorrow's lab result. I am concerned that he might be heading towards requiring dialysis which may complicates his further hospital course and discharge planning.  He has tracheostomy and multiple comorbidity.  I have discussed and reviewed this in detail with his daughter who is a Marine scientist in cardiology office on 8/24.  I explained to her that the dialysis will not increase the quality of life.  Palliative care is following.  #HTN/Volume Status: Still volume up however looks much better.  A dose of Lasix today.  Continue metoprolol  #Hypoxic respiratory failure/ventilatory dependence: Management per primary team.  He has tracheostomy and on vent.  #Anemia multifactorial including kidney disease, chronic illness: Continue to monitor.  Ferritin 325.  Iron saturation of 12.  Feraheme x2 doses.  Monitor hemoglobin  # Hyponatremia hypervolemic: Serum sodium level improved with IV loop diuretics.  #Hyperkalemia: Resolved.    Subjective: Seen and examined.  Urine output 1.5 L.  Both renal parameters are worsening.  Remains confused however not much change from yesterday.   Objective Vital signs in last 24 hours: Vitals:   12/26/19 0735 12/26/19 0800 12/26/19 0900 12/26/19  1000  BP:  (!) 148/76 130/73   Pulse:  95 99 94  Resp:  (!) 24 (!) 23 20  Temp:  (!) 97.3 F (36.3 C)    TempSrc:  Axillary    SpO2: 94% 91% 94% 94%  Weight:      Height:       Weight change:   Intake/Output Summary (Last 24 hours) at 12/26/2019 1143 Last data filed at 12/26/2019 0600 Gross per 24 hour  Intake 1263.08 ml  Output 1580 ml  Net -316.92 ml       Labs: Basic Metabolic Panel: Recent Labs  Lab 12/24/19 0616 12/25/19 0632 12/26/19 0743  NA 137 137 136  K 4.9 4.2 3.6  CL 95* 93* 91*  CO2 _0 GLUCOSE 97 85 234*  BUN 142* 154* 162*  CREATININE 3.16* 3.58* 3.63*  CALCIUM 8.8* 9.0 8.5*  PHOS 7.9* 8.3* 7.7*   Liver Function Tests: Recent Labs  Lab 12/20/19 0905 12/20/19 0905 12/21/19 0617 12/21/19 0617 12/13/2019 0453 12/23/19 0441 12/24/19 0616 12/25/19 0632 12/26/19 0743  AST 65*  --  73*  --  82*  --   --   --   --   ALT 32  --  33  --  34  --   --   --   --   ALKPHOS 374*  --  393*  --  340*  --   --   --   --   BILITOT 1.2  --  1.4*  --  1.5*  --   --   --   --   PROT 7.2  --  6.5  --  6.5  --   --   --   --   ALBUMIN 2.9*   < > 3.1*   < > 3.0*   < > 2.6* 2.7* 3.0*   < > = values in this interval not displayed.   No results for input(s): LIPASE, AMYLASE in the last 168 hours. No results for input(s): AMMONIA in the last 168 hours. CBC: Recent Labs  Lab 12/20/19 0905 12/20/19 1406 12/21/19 0617 12/21/19 1206 12/16/2019 0453  WBC 12.2*  --  10.0  --  11.9*  HGB 7.9*   < > 7.8* 8.2* 7.6*  HCT 24.5*   < > 24.3* 24.0* 23.4*  MCV 95.3  --  95.3  --  94.7  PLT 566*  --  523*  --  521*   < > = values in this interval not displayed.   Cardiac Enzymes: No results for input(s): CKTOTAL, CKMB, CKMBINDEX, TROPONINI in the last 168 hours. CBG: Recent Labs  Lab 12/25/19 1127 12/25/19 1931 12/25/19 2321 12/26/19 0315 12/26/19 0821  GLUCAP 125* 82 53* 150* 235*    Iron Studies:  No results for input(s): IRON, TIBC, TRANSFERRIN,  FERRITIN in the last 72 hours. Studies/Results: No results found.  Medications: Infusions: . sodium chloride Stopped (12/25/19 1702)  . feeding supplement (JEVITY 1.2 CAL) 1,000 mL (12/26/19 0639)  . ferumoxytol 510 mg (12/23/19 0917)    Scheduled Medications: . sodium chloride   Intravenous Once  . atorvastatin  80 mg Per Tube QHS  . bethanechol  10 mg Per Tube TID  . budesonide (PULMICORT) nebulizer solution  0.5 mg Nebulization BID  . chlorhexidine gluconate (MEDLINE KIT)  15 mL Mouth Rinse BID  . Chlorhexidine Gluconate Cloth  6 each Topical Q0600  . feeding supplement (PROSource TF)  45 mL Per Tube TID  . heparin injection (subcutaneous)  5,000 Units Subcutaneous Q8H  . insulin aspart  0-15 Units Subcutaneous Q4H  . insulin glargine  15 Units Subcutaneous Daily  . ipratropium-albuterol  3 mL Nebulization TID  . mouth rinse  15 mL Mouth Rinse 10 times per day  . melatonin  3 mg Per Tube QHS  . modafinil  100 mg Per Tube QAC breakfast  . multivitamin with minerals  1 tablet Per Tube Daily  . pantoprazole sodium  40 mg Per Tube QHS  . senna-docusate  2 tablet Oral QHS    have reviewed scheduled and prn medications.  Physical Exam: General:NAD, comfortable, has tracheostomy, sitting on bed, alert awake Heart:RRR, s1s2 nl Lungs: Bibasal rhonchi Abdomen:soft, Non-tender, non-distended Extremities: Extremities edema + Dialysis Access: None  Kelly Williams Tanna Furry 12/26/2019,11:43 AM  LOS: 22 days  Pager: [phone]

## 2019-12-26 NOTE — Progress Notes (Signed)
Nutrition Follow-up  DOCUMENTATION CODES:   Severe malnutrition in context of chronic illness  INTERVENTION:   Tube feeding via Cortrak: Jevity 1.2 at 55 ml/h (1320 ml per day) Prosource TF 45 ml TID  Provides 1704 kcal, 106 gm protein, 1070 ml free water daily   NUTRITION DIAGNOSIS:   Severe Malnutrition related to chronic illness (COPD, DM, ETOH abuse) as evidenced by severe fat depletion, severe muscle depletion.  Ongoing  GOAL:   Patient will meet greater than or equal to 90% of their needs  Met with TF  MONITOR:   Diet advancement, TF tolerance  REASON FOR ASSESSMENT:   Consult, Ventilator Enteral/tube feeding initiation and management  ASSESSMENT:   Pt with PMH of DM, HTN, ETOH abuse, COPD, HLD, and CKD admitted for planned redo R carotid endarterectomy then after neuro change s/p stat exploration of R CEA and R ICA thrombectomy with no intervention. Hospital course complicated by acute respiratory failure 2/2 aspiration PNA.   Pt discussed during ICU rounds and with RN.  Renal following, no indication for iHD for now.  Pt on trach collar x 1 hour; unable to work with SLP today now back on vent.   8/3 admit 8/4 self-extubated 8/5 re-intubated 8/6 cortrak placed  8/7 extubated 8/11 re-intubated; cortrak replaced (gastric) 8/21 trach placed   Patient is currently intubated on ventilator support MV: 6.9 L/min Temp (24hrs), Avg:98 F (36.7 C), Min:97.3 F (36.3 C), Max:98.7 F (37.1 C)  Labs: PO4: 7.7, BUN: 162 H, Cr: 3.63 H  CBG's: [phone] Medications: Novolog, Lantus, MVI, Protonix, senokot-s  I&O: + 8 L  UOP: 1580 ml   Current TF:  Jevity 1.2 at 50 ml/h  Prosource TF 45 ml TID Provides 1560 kcal, 99 gm protein   Diet Order:   Diet Order            Diet NPO time specified  Diet effective midnight                 EDUCATION NEEDS:   No education needs have been identified at this time  Skin:  Skin Assessment: Skin Integrity  Issues: Skin Integrity Issues:: Incisions Incisions: R neck  Last BM:  8/24  Height:   Ht Readings from Last 1 Encounters:  12/03/2019 5' 6"  (1.676 m)    Weight:   Wt Readings from Last 1 Encounters:  12/25/19 77 kg    Ideal Body Weight:  64.5 kg  BMI:  Body mass index is 27.4 kg/m.  Estimated Nutritional Needs:   Kcal:  1600-1800  Protein:  95-115 grams  Fluid:  > 1.8 L/day  Lockie Pares., RD, LDN, CNSC See AMiON for contact information

## 2019-12-26 NOTE — Progress Notes (Signed)
Subjective: Pt is awake and responsive this morning. Still on vent via trach.  Objective: Vital signs in last 24 hours: Temp:  [97.3 F (36.3 C)-98.7 F (37.1 C)] 97.3 F (36.3 C) (08/25 0800) Pulse Rate:  [79-101] 94 (08/25 1000) Resp:  [13-28] 20 (08/25 1000) BP: (105-148)/(56-87) 130/73 (08/25 0900) SpO2:  [91 %-100 %] 94 % (08/25 1000) FiO2 (%):  [40 %-60 %] 40 % (08/25 0734)  Physical Exam: Head: NCAT. No lesion. Ears: Examination of the ears shows normal auricles and external auditory canals bilaterally.  Nose: Nasal examination shows normal mucosa, septum, turbinates.  Face: Facial examination shows no asymmetry.  Mouth: Oral cavity examination shows no mucosal lacerations. Neck:Right neck incision is c/d/i.The trachtube is in place and venting well. Neuro:On vent. Awake and responsive.  No results for input(s): WBC, HGB, HCT, PLT in the last 72 hours. Recent Labs    12/25/19 0632 12/26/19 0743  NA 137 136  K 4.2 3.6  CL 93* 91*  CO2 27 28  GLUCOSE 85 234*  BUN 154* 162*  CREATININE 3.58* 3.63*  CALCIUM 9.0 8.5*    Medications:  I have reviewed the patient's current medications. Scheduled: . sodium chloride   Intravenous Once  . atorvastatin  80 mg Per Tube QHS  . bethanechol  10 mg Per Tube TID  . budesonide (PULMICORT) nebulizer solution  0.5 mg Nebulization BID  . chlorhexidine gluconate (MEDLINE KIT)  15 mL Mouth Rinse BID  . Chlorhexidine Gluconate Cloth  6 each Topical Q0600  . feeding supplement (PROSource TF)  45 mL Per Tube TID  . heparin injection (subcutaneous)  5,000 Units Subcutaneous Q8H  . insulin aspart  0-15 Units Subcutaneous Q4H  . insulin glargine  15 Units Subcutaneous Daily  . ipratropium-albuterol  3 mL Nebulization TID  . mouth rinse  15 mL Mouth Rinse 10 times per day  . melatonin  3 mg Per Tube QHS  . modafinil  100 mg Per Tube QAC breakfast  . multivitamin with minerals  1 tablet Per Tube Daily  . pantoprazole sodium  40  mg Per Tube QHS  . senna-docusate  2 tablet Oral QHS   Continuous: . sodium chloride Stopped (12/25/19 1702)  . feeding supplement (JEVITY 1.2 CAL) 1,000 mL (12/26/19 0639)  . ferumoxytol 510 mg (12/23/19 0917)    Assessment/Plan: POD #4 s/p trach. - Routine fresh trach care. - Wean vent as tolerated. - Will change trach early next week. If off vent, will change to an uncuffed trach.   LOS: 22 days   Kelly Williams W Kelly Williams 12/26/2019, 10:43 AM

## 2019-12-27 ENCOUNTER — Inpatient Hospital Stay (HOSPITAL_COMMUNITY): Payer: Medicare HMO

## 2019-12-27 DIAGNOSIS — E8779 Other fluid overload: Secondary | ICD-10-CM

## 2019-12-27 DIAGNOSIS — I4819 Other persistent atrial fibrillation: Secondary | ICD-10-CM

## 2019-12-27 LAB — GLUCOSE, CAPILLARY
Glucose-Capillary: 137 mg/dL — ABNORMAL HIGH (ref 70–99)
Glucose-Capillary: 118 mg/dL — ABNORMAL HIGH (ref 70–99)
Glucose-Capillary: 177 mg/dL — ABNORMAL HIGH (ref 70–99)
Glucose-Capillary: 245 mg/dL — ABNORMAL HIGH (ref 70–99)
Glucose-Capillary: 270 mg/dL — ABNORMAL HIGH (ref 70–99)
Glucose-Capillary: 155 mg/dL — ABNORMAL HIGH (ref 70–99)
Glucose-Capillary: 102 mg/dL — ABNORMAL HIGH (ref 70–99)
Glucose-Capillary: 93 mg/dL (ref 70–99)

## 2019-12-27 LAB — POCT I-STAT 7, (LYTES, BLD GAS, ICA,H+H)
Acid-Base Excess: 7 mmol/L — ABNORMAL HIGH (ref 0.0–2.0)
Bicarbonate: 33.4 mmol/L — ABNORMAL HIGH (ref 20.0–28.0)
Calcium, Ion: 1.06 mmol/L — ABNORMAL LOW (ref 1.15–1.40)
HCT: 27 % — ABNORMAL LOW (ref 39.0–52.0)
Hemoglobin: 9.2 g/dL — ABNORMAL LOW (ref 13.0–17.0)
O2 Saturation: 63 %
Patient temperature: 97.3
Potassium: 3.6 mmol/L (ref 3.5–5.1)
Sodium: 140 mmol/L (ref 135–145)
TCO2: 35 mmol/L — ABNORMAL HIGH (ref 22–32)
pCO2 arterial: 52.3 mmHg — ABNORMAL HIGH (ref 32.0–48.0)
pH, Arterial: 7.41 (ref 7.350–7.450)
pO2, Arterial: 32 mmHg — CL (ref 83.0–108.0)

## 2019-12-27 LAB — RENAL FUNCTION PANEL
Albumin: 2.8 g/dL — ABNORMAL LOW (ref 3.5–5.0)
Albumin: 2.8 g/dL — ABNORMAL LOW (ref 3.5–5.0)
Anion gap: 19 — ABNORMAL HIGH (ref 5–15)
Anion gap: 17 — ABNORMAL HIGH (ref 5–15)
BUN: 169 mg/dL — ABNORMAL HIGH (ref 8–23)
BUN: 172 mg/dL — ABNORMAL HIGH (ref 8–23)
CO2: 27 mmol/L (ref 22–32)
CO2: 29 mmol/L (ref 22–32)
Calcium: 8.4 mg/dL — ABNORMAL LOW (ref 8.9–10.3)
Calcium: 8.5 mg/dL — ABNORMAL LOW (ref 8.9–10.3)
Chloride: 93 mmol/L — ABNORMAL LOW (ref 98–111)
Chloride: 93 mmol/L — ABNORMAL LOW (ref 98–111)
Creatinine, Ser: 3.9 mg/dL — ABNORMAL HIGH (ref 0.61–1.24)
Creatinine, Ser: 3.92 mg/dL — ABNORMAL HIGH (ref 0.61–1.24)
GFR calc Af Amer: 16 mL/min — ABNORMAL LOW (ref >60)
GFR calc Af Amer: 16 mL/min — ABNORMAL LOW (ref >60)
GFR calc non Af Amer: 14 mL/min — ABNORMAL LOW (ref >60)
GFR calc non Af Amer: 14 mL/min — ABNORMAL LOW (ref >60)
Glucose, Bld: 215 mg/dL — ABNORMAL HIGH (ref 70–99)
Glucose, Bld: 164 mg/dL — ABNORMAL HIGH (ref 70–99)
Phosphorus: 7.9 mg/dL — ABNORMAL HIGH (ref 2.5–4.6)
Phosphorus: 7.8 mg/dL — ABNORMAL HIGH (ref 2.5–4.6)
Potassium: 3.8 mmol/L (ref 3.5–5.1)
Potassium: 3.7 mmol/L (ref 3.5–5.1)
Sodium: 139 mmol/L (ref 135–145)
Sodium: 139 mmol/L (ref 135–145)

## 2019-12-27 LAB — POCT ACTIVATED CLOTTING TIME
Activated Clotting Time: 147 seconds
Activated Clotting Time: 153 seconds

## 2019-12-27 MED ORDER — SODIUM CHLORIDE 0.9 % IV SOLN
250.0000 [IU]/h | INTRAVENOUS | Status: AC
  Administered 2019-12-27: 250 [IU]/h via INTRAVENOUS_CENTRAL
  Administered 2019-12-28: 950 [IU]/h via INTRAVENOUS_CENTRAL
  Administered 2019-12-28: 900 [IU]/h via INTRAVENOUS_CENTRAL
  Administered 2019-12-28: 800 [IU]/h via INTRAVENOUS_CENTRAL
  Administered 2019-12-28: 1050 [IU]/h via INTRAVENOUS_CENTRAL
  Administered 2019-12-28: 850 [IU]/h via INTRAVENOUS_CENTRAL
  Administered 2019-12-29: 800 [IU]/h via INTRAVENOUS_CENTRAL
  Filled 2019-12-27 (×5): qty 2

## 2019-12-27 MED ORDER — PRISMASOL BGK 4/2.5 32-4-2.5 MEQ/L REPLACEMENT SOLN
Status: DC
  Filled 2019-12-27 (×5): qty 5000

## 2019-12-27 MED ORDER — PRISMASOL BGK 4/2.5 32-4-2.5 MEQ/L REPLACEMENT SOLN
Status: DC
  Filled 2019-12-27 (×9): qty 5000

## 2019-12-27 MED ORDER — HEPARIN BOLUS VIA INFUSION (CRRT)
1000.0000 [IU] | INTRAVENOUS | Status: DC | PRN
  Filled 2019-12-27: qty 1000

## 2019-12-27 MED ORDER — MIDAZOLAM HCL 2 MG/2ML IJ SOLN
2.0000 mg | Freq: Once | INTRAMUSCULAR | Status: AC
  Administered 2019-12-27: 2 mg via INTRAVENOUS
  Filled 2019-12-27: qty 2

## 2019-12-27 MED ORDER — PRISMASOL BGK 4/2.5 32-4-2.5 MEQ/L IV SOLN
INTRAVENOUS | Status: DC
  Filled 2019-12-27 (×58): qty 5000

## 2019-12-27 MED ORDER — HEPARIN SODIUM (PORCINE) 1000 UNIT/ML DIALYSIS
1000.0000 [IU] | INTRAMUSCULAR | Status: DC | PRN
  Administered 2020-01-03: 2400 [IU] via INTRAVENOUS_CENTRAL
  Filled 2019-12-27 (×2): qty 6
  Filled 2019-12-27: qty 3
  Filled 2019-12-27: qty 1

## 2019-12-27 NOTE — Consult Note (Signed)
Cardiology Consultation:   Patient ID: [name] MRN: [id number]; DOB: May 29, 1940  Admit date: 12/24/2019 Date of Consult: 12/27/2019  Primary Care Provider: Luetta Nutting, DO [facility] Cardiologist: Buford Dresser, MD  [facility] Electrophysiologist:  None    Patient Profile:   Kelly Williams is a 79 y.o. male with a hx of PAD, CAD, COPD, type 2 diabets, hypertension, hyperlipidemia, CVA, carotid artery stenosis s.p CEA who is being seen today for the evaluation of atrial fibrillation, possible heart failure at the request of Dr. Oneida Alar and patient's family.  History of Present Illness:   Kelly Williams is well known to me from my outpatient clinic. He has had a complicated clinical course, since his admission 7.26.21 for acute CVA and then admission since 12/18/2019 for CEA. He has undergone tracheostomy this admission and has been unable to come off of mechanical ventilation for any significant period of time. He is volume overloaded with acute kidney injury superimposed on chronic kidney disease stage.  [name] is awake and alert, but he can only mouth brief answers to questions. History reviewed in chart and corroborated with daughter Kelly Williams at bedside. She works in the [employer] and is Kelly Williams's primary caregiver.  Kelly Williams and Mr. Graca have tried to discuss his situation, and they both feel that he has a chance for meaningful recovery. Kelly Williams has spoken with palliative care, but she and Mr. Falco both state they want to continue with all medical therapies, full code at this time.   Kelly Williams is concerned that the fluid may be due to his heart being in failure, as this has been mentioned to her. She does note that he already appears to be slightly less swollen since CRRT was initiated.  She is also concerned about the new onset atrial fibrillation and what can be done to manage this.  Kelly Williams is uncomfortable in the bed, trach site is  oozing somewhat, but no other concerns at this time.   Past Medical History:  Diagnosis Date  . Alcohol abuse   . Alcohol abuse   . Arthritis   . Carotid arterial disease ([place])   . Chronic kidney disease    CKD-patient denies  . COPD (chronic obstructive pulmonary disease) (HCC)    uses inhalers  . Coronary artery disease   . Diabetes mellitus without complication ([place])   . Diabetic retinopathy ([place])   . Hyperlipidemia   . Hypertension   . Normocytic anemia 01/16/2019  . Stroke [facility])    around 2020, 2 after bypass in 2015    Past Surgical History:  Procedure Laterality Date  . APPENDECTOMY     as teenager  . BACK SURGERY    . CAROTID ENDARTERECTOMY Right   . CORONARY ARTERY BYPASS GRAFT     10-12 years ago in [place] at [facility]  . ENDARTERECTOMY Right 12/08/2019   Procedure: EXPLORATION OF NECK, PLACEMENT OF 31fBLAKE DRAIN;  Surgeon: DAngelia Mould MD;  [facility] [facility]  Service: Vascular;  Laterality: Right;  . ENDARTERECTOMY Right 12/31/2019   Procedure: REDO RIGHT CAROTID ENDARTERECTOMY;  Surgeon: FElam Dutch MD;  [facility];  Service: Vascular;  Laterality: Right;  . IR PATIENT EVAL TECH 0-60 MINS  11/27/2019  . PATCH ANGIOPLASTY Right 12/30/2019   Procedure: PATCH ANGIOPLASTY USING HEMASHIELD PLATINUM FINESSE CARDIOVASCULAR PATCH;  Surgeon: FElam Dutch MD;  Location: [facility] OR;  Service: Vascular;  Laterality: Right;  . REVERSE SHOULDER ARTHROPLASTY Right 07/18/2019  Procedure: REVERSE SHOULDER ARTHROPLASTY WITH SUBSCAPULAR REPAIR;  Surgeon: Hiram Gash, MD;  Location: [facility];  Service: Orthopedics;  Laterality: Right;  . THROMBECTOMY BRACHIAL ARTERY Right 12/26/2019   Procedure: THROMBECTOMY CAROTID ARTERY;  Surgeon: Angelia Mould, MD;  Location: [place];  Service: Vascular;  Laterality: Right;  . TRACHEOSTOMY TUBE PLACEMENT N/A 12/07/2019   Procedure: TRACHEOSTOMY;  Surgeon: Leta Baptist, MD;  Location: [place];  Service: ENT;  Laterality:  N/A;  . VASECTOMY       Home Medications:  Prior to Admission medications   Medication Sig Start Date End Date Taking? Authorizing Provider  acetaminophen (TYLENOL) 500 MG tablet Take 1,000 mg by mouth every 6 (six) hours as needed for mild pain.   Yes [provider]  albuterol (PROVENTIL HFA;VENTOLIN HFA) 108 (90 Base) MCG/ACT inhaler Inhale 1-2 puffs into the lungs every 4 (four) hours as needed for wheezing or shortness of breath (bronchospasm). 06/20/18  Yes Trixie Dredge, PA-C  Alcohol Swabs (B-D SINGLE USE SWABS REGULAR) PADS Use to check glucose daily Patient taking differently: 1 each by Other route See admin instructions. Use to check glucose daily 09/07/19  Yes Luetta Nutting, DO  aspirin EC 325 MG EC tablet Take 1 tablet (325 mg total) by mouth daily. 11/30/19  Yes Shelly Coss, MD  atorvastatin (LIPITOR) 80 MG tablet Take 1 tablet (80 mg total) by mouth at bedtime. 11/29/19  Yes Shelly Coss, MD  bismuth subsalicylate (PEPTO BISMOL) 262 MG/15ML suspension Take 30 mLs by mouth every 6 (six) hours as needed for indigestion or diarrhea or loose stools.   Yes [provider]  Blood Glucose Monitoring Suppl (TRUE METRIX AIR GLUCOSE METER) DEVI Dx DM E11.9 Check fasting blood sugar every morning and once 2 hours after largest meal of the day. Patient taking differently: 1 each by Other route See admin instructions. Dx DM E11.9 Check fasting blood sugar every morning and once 2 hours after largest meal of the day. 09/13/19  Yes Luetta Nutting, DO  celecoxib (CELEBREX) 100 MG capsule Take 1 capsule (100 mg total) by mouth 2 (two) times daily as needed for mild pain. Patient taking differently: Take 200 mg by mouth 2 (two) times daily as needed for mild pain.  09/10/19  Yes Luetta Nutting, DO  clopidogrel (PLAVIX) 75 MG tablet Take 1 tablet (75 mg total) by mouth daily. 11/30/19  Yes Adhikari, Tamsen Meek, MD  Dulaglutide (TRULICITY) 3.38 VA/9.1BT SOPN Inject 0.75 mg  into the skin once a week. Patient taking differently: Inject 0.75 mg into the skin once a week. Saturday 09/10/19  Yes Luetta Nutting, DO  gabapentin (NEURONTIN) 300 MG capsule Take 1 capsule (300 mg total) by mouth 3 (three) times daily. 09/07/19  Yes Luetta Nutting, DO  glucose blood (TRUE METRIX BLOOD GLUCOSE TEST) test strip Use as instructed Patient taking differently: 1 each by Other route as directed.  09/07/19  Yes Luetta Nutting, DO  ipratropium-albuterol (DUONEB) 0.5-2.5 (3) MG/3ML SOLN Take 3 mLs by nebulization every 6 (six) hours as needed. Patient taking differently: Take 3 mLs by nebulization every 6 (six) hours as needed (COPD).  10/27/18  Yes Trixie Dredge, PA-C  loperamide (IMODIUM) 2 MG capsule Take 1 capsule (2 mg total) by mouth every 8 (eight) hours as needed for diarrhea or loose stools. 11/29/19  Yes Shelly Coss, MD  losartan (COZAAR) 25 MG tablet Take 1 tablet (25 mg total) by mouth every morning. Patient taking differently: Take 25 mg by mouth at  bedtime.  06/25/19  Yes Buford Dresser, MD  Respiratory Therapy Supplies (NEBULIZER/TUBING/MOUTHPIECE) KIT Use as directed with nebulizer treatments (Duoneb) every 4 hours prn. Dx: J44.9 Patient taking differently: 1 each by Other route See admin instructions. Use as directed with nebulizer treatments (Duoneb) every 4 hours prn. Dx: J44.9 06/20/18  Yes Trixie Dredge, PA-C  TRUEplus Lancets 30G MISC Use to check glucose daily Patient taking differently: 1 each by Other route daily.  09/07/19  Yes Luetta Nutting, DO  Tiotropium Bromide-Olodaterol (STIOLTO RESPIMAT) 2.5-2.5 MCG/ACT AERS Inhale 2 puffs into the lungs daily. Patient not taking: Reported on 11/26/2019 10/27/18   Trixie Dredge, PA-C    Inpatient Medications: Scheduled Meds: . sodium chloride   Intravenous Once  . atorvastatin  80 mg Per Tube QHS  . bethanechol  10 mg Per Tube TID  . budesonide (PULMICORT) nebulizer solution   0.5 mg Nebulization BID  . chlorhexidine gluconate (MEDLINE KIT)  15 mL Mouth Rinse BID  . Chlorhexidine Gluconate Cloth  6 each Topical Q0600  . feeding supplement (PROSource TF)  45 mL Per Tube TID  . heparin injection (subcutaneous)  5,000 Units Subcutaneous Q8H  . insulin aspart  0-15 Units Subcutaneous Q4H  . insulin glargine  15 Units Subcutaneous Daily  . ipratropium-albuterol  3 mL Nebulization TID  . mouth rinse  15 mL Mouth Rinse 10 times per day  . melatonin  3 mg Per Tube QHS  . modafinil  100 mg Per Tube QAC breakfast  . multivitamin with minerals  1 tablet Per Tube Daily  . pantoprazole sodium  40 mg Per Tube QHS  . senna-docusate  2 tablet Oral QHS   Continuous Infusions: .  prismasol BGK 4/2.5 500 mL/hr at 12/27/19 1616  .  prismasol BGK 4/2.5 300 mL/hr at 12/27/19 1616  . sodium chloride Stopped (12/25/19 1702)  . feeding supplement (JEVITY 1.2 CAL) 55 mL/hr at 12/27/19 1300  . ferumoxytol 510 mg (12/23/19 0917)  . heparin 10,000 units/ 20 mL infusion syringe 750 Units/hr (12/27/19 2310)  . prismasol BGK 4/2.5 1,500 mL/hr at 12/27/19 2004   PRN Meds: sodium chloride, acetaminophen **OR** acetaminophen, fentaNYL (SUBLIMAZE) injection, fentaNYL (SUBLIMAZE) injection, heparin, heparin, hydrALAZINE, ipratropium-albuterol, metoprolol tartrate, ondansetron (ZOFRAN) IV, phenol, polyethylene glycol, potassium chloride  Allergies:    Allergies  Allergen Reactions  . Metformin And Related Diarrhea    Social History:   Social History   Tobacco Use  . Smoking status: Former Smoker    Packs/day: 1.00    Years: 10.00    Pack years: 10.00    Types: Cigarettes    Quit date: 10/12/2002    Years since quitting: 17.2  . Smokeless tobacco: Never Used  Vaping Use  . Vaping Use: Never used  Substance Use Topics  . Alcohol use: Not Currently  . Drug use: Never   Family History:    Family History  Problem Relation Age of Onset  . Hypertension Mother   . Hypertension  Father     ROS:  Please see the history of present illness. Unable to obtain more specific review of systems as patient cannot speak at the moment and can only answer brief yes/no questions.  Physical Exam/Data:   Vitals:   12/27/19 2022 12/27/19 2100 12/27/19 2200 12/27/19 2300  BP:  134/84 (!) 150/71 (!) 143/76  Pulse:   98 85  Resp:  _0 Temp:      TempSrc:      SpO2: 97% 100%  99% 99%  Weight:      Height:        Intake/Output Summary (Last 24 hours) at 12/27/2019 2311 Last data filed at 12/27/2019 2300 Gross per 24 hour  Intake 1414 ml  Output 2495 ml  Net -1081 ml   Last 3 Weights 12/25/2019 12/18/2019 12/21/2019  Weight (lbs) 169 lb 12.1 oz 169 lb 12.1 oz 174 lb 9.7 oz  Weight (kg) 77 kg 77 kg 79.2 kg     Body mass index is 27.4 kg/m.  General:  Ill appearing, on mechanical ventilation, feeding tube in place HEENT: trach site with slight bright red blood oozing around site Neck: cannot visualize JVD. R CEA incision well healed Vascular: No carotid bruits; FA pulses 2+ bilaterally without bruits  Cardiac:  Irregularly irregular. Did not appreciate murmurs but distant heart sounds Lungs:  Coarse, mechanical ventilation via trach Abd: soft, nontender, no hepatomegaly  Ext: diffuse edema, trace to 1+, dependent Musculoskeletal:  Able to move all 4 limbs independently Skin: warm and dry  Psych:  Normal affect, alert and interactive  EKG:  The EKG was personally reviewed and demonstrates:  12/05/19 atrial fibrillation at 101 bpm Telemetry:  Telemetry was personally reviewed and demonstrates:  Atrial fibrillation  Relevant CV Studies: Carotid duplex 01/01/2020 Right Carotid: Evidence consistent with a total occlusion of the right  ICA.  Echo 11/27/19  1. Left ventricular ejection fraction, by estimation, is 50 to 55%. The  left ventricle has low normal function. The left ventricle has no regional  wall motion abnormalities. Left ventricular diastolic parameters are    consistent with Grade I diastolic  dysfunction (impaired relaxation).  2. Right ventricular systolic function is normal. The right ventricular  size is normal.  3. The mitral valve is normal in structure. Mild mitral valve  regurgitation. No evidence of mitral stenosis.  4. The aortic valve is tricuspid. Aortic valve regurgitation is not  visualized. Mild to moderate aortic valve sclerosis/calcification is  present, without any evidence of aortic stenosis.  5. The inferior vena cava is normal in size with greater than 50%  respiratory variability, suggesting right atrial pressure of 3 mmHg.   Laboratory Data:  High Sensitivity Troponin:  No results for input(s): TROPONINIHS in the last 720 hours.   Chemistry Recent Labs  Lab 12/26/19 0743 12/26/19 0743 12/27/19 0550 12/27/19 1607 12/27/19 1705  NA 136   < > 139 139 140  K 3.6   < > 3.8 3.7 3.6  CL 91*  --  93* 93*  --   CO2 28  --  27 29  --   GLUCOSE 234*  --  215* 164*  --   BUN 162*  --  169* 172*  --   CREATININE 3.63*  --  3.90* 3.92*  --   CALCIUM 8.5*  --  8.4* 8.5*  --   GFRNONAA 15*  --  14* 14*  --   GFRAA 17*  --  16* 16*  --   ANIONGAP 17*  --  19* 17*  --    < > = values in this interval not displayed.    Recent Labs  Lab 12/21/19 0617 12/21/19 0617 12/29/2019 0453 12/23/19 0441 12/26/19 0743 12/27/19 0550 12/27/19 1607  PROT 6.5  --  6.5  --   --   --   --   ALBUMIN 3.1*   < > 3.0*   < > 3.0* 2.8* 2.8*  AST 73*  --  82*  --   --   --   --  ALT 33  --  34  --   --   --   --   ALKPHOS 393*  --  340*  --   --   --   --   BILITOT 1.4*  --  1.5*  --   --   --   --    < > = values in this interval not displayed.   Hematology Recent Labs  Lab 12/21/19 0617 12/21/19 0617 12/21/19 1206 12/05/2019 0453 12/27/19 1705  WBC 10.0  --   --  11.9*  --   RBC 2.55*  --   --  2.47*  --   HGB 7.8*   < > 8.2* 7.6* 9.2*  HCT 24.3*   < > 24.0* 23.4* 27.0*  MCV 95.3  --   --  94.7  --   MCH 30.6  --   --   30.8  --   MCHC 32.1  --   --  32.5  --   RDW 14.6  --   --  14.8  --   PLT 523*  --   --  521*  --    < > = values in this interval not displayed.   BNPNo results for input(s): BNP, PROBNP in the last 168 hours.  DDimer No results for input(s): DDIMER in the last 168 hours.   Radiology/Studies:  DG Abd 1 View  Result Date: 12/23/2019 CLINICAL DATA:  Tube placement. EXAM: ABDOMEN - 1 VIEW COMPARISON:  December 05, 2019 FINDINGS: A nasogastric tube is seen with its distal tip overlying the expected region of the gastric antrum. Multiple sternal wires and vascular clips are seen within the visualized portion of the chest. Marked severity diffuse bilateral infiltrates are also noted. The bowel gas pattern is normal. No radio-opaque calculi or other significant radiographic abnormality are seen. IMPRESSION: Nasogastric tube positioning, as described above. Electronically Signed   By: Virgina Norfolk M.D.   On: 12/23/2019 23:44   DG CHEST PORT 1 VIEW  Result Date: 12/27/2019 CLINICAL DATA:  Central line placement EXAM: PORTABLE CHEST 1 VIEW COMPARISON:  12/27/2019 FINDINGS: Interval placement of right subclavian dialysis catheter with the tip in the SVC. Tracheostomy tube and feeding tube are unchanged. Cardiomegaly. Bilateral airspace disease again noted, unchanged. IMPRESSION: Right subclavian dialysis catheter placement.  No pneumothorax. Stable diffuse bilateral airspace disease. Electronically Signed   By: Rolm Baptise M.D.   On: 12/27/2019 15:54   DG CHEST PORT 1 VIEW  Result Date: 12/27/2019 CLINICAL DATA:  Dyspnea. EXAM: PORTABLE CHEST 1 VIEW COMPARISON:  12/23/2019 FINDINGS: Tracheostomy tube tip is above the carina. There is a feeding tube with tip well below the level of the GE junction. Previous right shoulder arthroplasty. Bilateral pleural effusions are identified. Diffuse increase interstitial markings are noted bilaterally, unchanged from previous exam. Associated decreased aeration of  both lung bases noted. IMPRESSION: 1. Persistent bilateral pleural effusions and interstitial edema compatible with moderate to severe CHF. 2. Stable support apparatus. Electronically Signed   By: Kerby Moors M.D.   On: 12/27/2019 10:08   DG Chest Port 1 View  Result Date: 12/23/2019 CLINICAL DATA:  Hypoxia EXAM: PORTABLE CHEST 1 VIEW COMPARISON:  12/21/2019 FINDINGS: Tracheostomy tube tip is at the level of the clavicular heads. Esophageal tube courses below the field of view. Remote median sternotomy. Intermediate sized right pleural effusion with basilar consolidation. IMPRESSION: Intermediate sized right pleural effusion and basilar consolidation. Electronically Signed   By: Cletus Gash.D.  On: 12/23/2019 23:44     Assessment and Plan:   CVA, s/p R CEA 8.3.21 with postop neuro change, carotid doppler showing complete occlusion, taken back to OR for revision but no clear clots or other issues found. Complex course since that time, including respiratory failure with several intubations and ultimately tracheostomy on 12/06/2019; also with acute kidney injury on chronic kidney disease stage 3, with start of CRRT 12/27/19 -receiving coordinated care per vascular surgery, nephrology, pulm/critical care, and palliative care.  -continued on atorvastatin -antiplatelet currently on hold, restart clopidogrel when OK from vascular standpoint. Still with slight oozing at trach site, but with multiple CEA interventions and recent stroke, would restart as soon as able  Volume overload, noted as concerning for possible cardiac or cardiorenal cause: -prior echo reviewed, but his clinical condition has significantly deteriorated since that time -will repeat echo to make sure structure and function not contributing to volume overload  Atrial fibrillation: likely driven by critical illness.  -not currently on anticoagulation given oozing/critical illness -CHA2DS2/VAS Stroke Risk Points at least 7 and  possible 8, with heart failure evaluation pending -when stable, will need long term anticoagulation, but not ready for this now  Overall I have known Mr. Clapper and his daughter Kelly Williams well for some time. I had an honest conversation with both today. They are hopeful that volume removal will allow him to wean from the vent and be able to work towards a meaningful recovery. They both wish to pursue all available options at this time. However, if in several days there has not been significant improvement, they would consider a more conservative approach. I encouraged Kelly Williams to be present tomorrow AM for his trach collar trial and to discuss with the team. I will follow up with them again tomorrow.  Very complex case, difficult medical decision making. Multiple organ systems involved, including neuro, vascular, respiratory, renal, and cardiac. He requires ICU level care and supervision at this time.  For questions or updates, please contact [place] Please consult [email or web address] for contact info under    Signed, Buford Dresser, MD  12/27/2019 11:11 PM

## 2019-12-27 NOTE — Progress Notes (Signed)
Palliative:  HPI: 79 y.o. male  with past medical history of stroke, hypertension, hyperlipidemia, diabetic retinopathy, DM, CAD, s/p CABG, COPD, CKD, arthritis, ETOH use admitted on 12/03/2019 with history of recurrent right ICA occlusion s/p CEA. Developed respiratory failure requiring intubation. Patient self-extubated 8/5, again extubated 8/7. On 8/11 AM, patient became unresponsive and hypoxemic requiring re-intubation and transfer to ICU. Trach placed 8/21. PT/OT following. Likely will need LTACH placement. Patient now with worsening AKI on CKD, possibly cardiorenal syndrome. Nephrology consulted. Patient receiving high-dose IV diuretics but worsening creatinine. May requiring dialysis and poor candidate for long-term dialysis with underlying chronic conditions and current condition with tracheostomy/vent dependency. Palliative medicine consultation for goals of care.   I met today at Kelly Williams's bedside. He is back on ventilator and did not tolerate trach collar today. When I ask about how he is doing he gives me a thumbs up sign. When I ask if he is hanging in there he also gives me a thumbs up. He attempts to communicate but I am unable to lip read and cannot understand. I asked if he would like to try and write it down but he did not respond yes or no. I told him that we are concerned about his kidneys and he indicates that this is not a problem he was aware of until now. I told him that we are awaiting plans from the kidney specialist and that I will call and speak with his daughter and he agrees.   I called and spoke with daughter, [name]. [name] was able to speak with me for a few minutes. She understands the plan for dialysis. We discussed concerns for overall big picture of these interventions and this can lead down very different paths based on his progress and what he will require to prolong life long term. I do have concern that he has already gone 23 days with limited progress. However, I  also feel that there can be possibility for improvement. I encourage [name] that we continue to discuss some of the scenarios of what the future could look like so we know any limitations and what to avoid so we can be aware moving forward. [name] agrees and is interested in discussing further. She visits in the evenings but will be around Saturday. I am not available in the evenings but will meet with her on Saturday - I do not feel there are any urgent decisions to be made at this time.   [name] has been discussing with a friend who is a NP and previous cardiac ICU RN that has been helping her know what to ask and has already brought to her attention some of the hardships of disposition with long term aggressive measures with ventilator and dialysis. We discussed that any limitations and decisions made should be based on his wishes and quality of life expectations. She agrees and we will discuss further Saturday.   Exam: Alert, orientation difficult to assess. Tolerating vent support via trach. Breathing regular, unlabored. Mild work of breathing. Abd flat. Moves all extremities.   Plan: - Will meet with daughter, [name], Saturday to continue discussions.   25 min  Vinie Sill, NP Palliative Medicine Team Pager [phone] (Please see [email or web address] for schedule) Team Phone [phone]    Greater than 50%  of this time was spent counseling and coordinating care related to the above assessment and plan

## 2019-12-27 NOTE — Progress Notes (Addendum)
  Progress Note    12/27/2019 7:49 AM 5 Days Post-Op  Subjective:  Resting this morning   Vitals:   12/27/19 0500 12/27/19 0600  BP: 111/63 125/70  Pulse: 99 86  Resp: (!) 26 18  Temp:    SpO2: 96% 98%   Physical Exam: Lungs:  Mechanical ventilation Incisions:  R neck incision healing well Extremities:  Warm and well perfused  CBC    Component Value Date/Time   WBC 11.9 (H) 12/05/2019 0453   RBC 2.47 (L) 12/13/2019 0453   HGB 7.6 (L) 12/31/2019 0453   HCT 23.4 (L) 12/30/2019 0453   PLT 521 (H) 12/16/2019 0453   MCV 94.7 12/21/2019 0453   MCH 30.8 12/13/2019 0453   MCHC 32.5 12/21/2019 0453   RDW 14.8 12/14/2019 0453   LYMPHSABS 3.4 11/26/2019 0004   MONOABS 0.9 11/26/2019 0004   EOSABS 0.3 11/26/2019 0004   BASOSABS 0.1 11/26/2019 0004    BMET    Component Value Date/Time   NA 139 12/27/2019 0550   K 3.8 12/27/2019 0550   CL 93 (L) 12/27/2019 0550   CO2 27 12/27/2019 0550   GLUCOSE 215 (H) 12/27/2019 0550   BUN 169 (H) 12/27/2019 0550   CREATININE 3.90 (H) 12/27/2019 0550   CREATININE 1.34 (H) 08/24/2019 1114   CALCIUM 8.4 (L) 12/27/2019 0550   GFRNONAA 14 (L) 12/27/2019 0550   GFRNONAA 50 (L) 08/24/2019 1114   GFRAA 16 (L) 12/27/2019 0550   GFRAA 58 (L) 08/24/2019 1114    INR    Component Value Date/Time   INR 1.2 12/25/2019 0453     Intake/Output Summary (Last 24 hours) at 12/27/2019 0749 Last data filed at 12/27/2019 0600 Gross per 24 hour  Intake 1275 ml  Output 1250 ml  Net 25 ml     Assessment/Plan:  79 y.o. male is s/p redo R CEA with post op respiratory failure requiring trach 5 Days Post-Op   R neck incision nearly healed Neuro exam stable Palliative care having ongoing discussions with patient's daughter Nephrology diuresing Vent management per CCM   [name], PA-C [facility] [phone] 12/27/2019 7:49 AM  Awake and alert on minimal vent assist.  Interacting some this morning Continue vent  wean Non oliguric renal failure hopefully some recovery Severe protein calorie malnutrition continue tube feeds  Ruta Hinds, MD [facility] [place] Office: [phone]

## 2019-12-27 NOTE — Procedures (Signed)
Central Venous Catheter Insertion Procedure Note  Kelly Williams  [id number]  Apr 06, 1941  Date:12/27/19  Time:3:01 PM   Provider Performing:Naasir Carreira   Procedure: Insertion of Non-tunneled Central Venous Catheter(36556)with US guidance ([id number])    Indication(s) Hemodialysis  Consent Risks of the procedure as well as the alternatives and risks of each were explained to the patient and/or caregiver.  Consent for the procedure was obtained and is signed in the bedside chart  Anesthesia Topical only with 1% lidocaine   Timeout Verified patient identification, verified procedure, site/side was marked, verified correct patient position, special equipment/implants available, medications/allergies/relevant history reviewed, required imaging and test results available.  Sterile Technique Maximal sterile technique including full sterile barrier drape, hand hygiene, sterile gown, sterile gloves, mask, hair covering, sterile ultrasound probe cover (if used).  Procedure Description Area of catheter insertion was cleaned with chlorhexidine and draped in sterile fashion.   With real-time ultrasound guidance a HD catheter was placed into the right subclavian vein.  Nonpulsatile blood flow and easy flushing noted in all ports.  The catheter was sutured in place and sterile dressing applied.  Complications/Tolerance None; patient tolerated the procedure well. Chest X-ray is ordered to verify placement for internal jugular or subclavian cannulation.  Chest x-ray is not ordered for femoral cannulation.  [name], MD [facility] ICU Physician [place]  Pager: [phone] Mobile: [phone] After hours: [phone].  12/27/2019, 3:03 PM

## 2019-12-27 NOTE — Progress Notes (Signed)
[facility] NEPHROLOGY PROGRESS NOTE  Assessment/ Plan: Pt is a 79 y.o. yo male  medical history significant for alcohol use disorder, PAD, COPD CAD, DM 2, HLD, HTN, CVAwho present w/endarterectomy revision complicated by ventilatory dependence and AKI  # Severe nonoliguric AKI on CKD 3:  Likely due to cardiorenal syndrome.  Renal ultrasound without obstruction.  Complicated hospital course with fluctuation in serum creatinine level.  Urine output decent with IV diuretics however he continued to become uremic.  Unable to tolerate trach collar and went to vent.  Chest x-ray with pulmonary edema/CHF. BUN 169, creatinine 3.9 today. Discussed with ICU team and also discussed at length with his daughter over the phone.  Plan to start CRRT today.  Maybe we will be able to take off fluid with CRRT which will help the respiratory status and vent setting.   If he remains on vent and became dialysis dependent then it will be difficult disposition.  We will need to rediscuss depending on his progress.  Palliative care team is following. ICU team is placing dialysis catheter today. Use 4K bath, IV heparin for systemic anticoagulation.  UF around 100 cc an hour if tolerated by BP.  #HTN/Volume Status: Still volume up however looks much better.  Starting CRRT.  Continue metoprolol  #Hypoxic respiratory failure/ventilatory dependence: Management per primary team.  He has tracheostomy and on vent.  #Acute pulmonary edema/CHF: Ultrafiltration with dialysis as discussed above.[name]  #Anemia multifactorial including kidney disease, chronic illness: Continue to monitor.  Ferritin 325.  Iron saturation of 12.  Feraheme x2 doses.  Monitor hemoglobin  # Hyponatremia hypervolemic: Serum sodium level improved with IV loop diuretics.  #Hyperkalemia: Resolved.    Subjective: Seen and examined.   Urine output 1.2 L.  He has anasarca.  Tried trach collar for an hour and went into bed.  Difficult to  obtain review of system. Objective Vital signs in last 24 hours: Vitals:   12/27/19 0933 12/27/19 1000 12/27/19 1120 12/27/19 1200  BP: 134/73 122/67 124/74   Pulse: 87 94 91   Resp: (!) 25 (!) 22 (!) 23   Temp:    (!) 97.5 F (36.4 C)  TempSrc:    Oral  SpO2: 96% 96% 97%   Weight:      Height:       Weight change:   Intake/Output Summary (Last 24 hours) at 12/27/2019 1214 Last data filed at 12/27/2019 0900 Gross per 24 hour  Intake 1440 ml  Output 1050 ml  Net 390 ml       Labs: Basic Metabolic Panel: Recent Labs  Lab 12/25/19 0632 12/26/19 0743 12/27/19 0550  NA 137 136 139  K 4.2 3.6 3.8  CL 93* 91* 93*  CO2 27 28 27   GLUCOSE 85 234* 215*  BUN 154* 162* 169*  CREATININE 3.58* 3.63* 3.90*  CALCIUM 9.0 8.5* 8.4*  PHOS 8.3* 7.7* 7.9*   Liver Function Tests: Recent Labs  Lab 12/21/19 0617 12/21/19 0617 12/17/2019 0453 12/23/19 0441 12/25/19 0632 12/26/19 0743 12/27/19 0550  AST 73*  --  82*  --   --   --   --   ALT 33  --  34  --   --   --   --   ALKPHOS 393*  --  340*  --   --   --   --   BILITOT 1.4*  --  1.5*  --   --   --   --   PROT 6.5  --  6.5  --   --   --   --   ALBUMIN 3.1*   < > 3.0*   < > 2.7* 3.0* 2.8*   < > = values in this interval not displayed.   No results for input(s): LIPASE, AMYLASE in the last 168 hours. No results for input(s): AMMONIA in the last 168 hours. CBC: Recent Labs  Lab 12/21/19 0617 12/21/19 1206 12/02/2019 0453  WBC 10.0  --  11.9*  HGB 7.8* 8.2* 7.6*  HCT 24.3* 24.0* 23.4*  MCV 95.3  --  94.7  PLT 523*  --  521*   Cardiac Enzymes: No results for input(s): CKTOTAL, CKMB, CKMBINDEX, TROPONINI in the last 168 hours. CBG: Recent Labs  Lab 12/26/19 1941 12/26/19 2321 12/27/19 0330 12/27/19 0750 12/27/19 1121  GLUCAP 149* 132* 177* 245* 270*    Iron Studies:  No results for input(s): IRON, TIBC, TRANSFERRIN, FERRITIN in the last 72 hours. Studies/Results: DG CHEST PORT 1 VIEW  Result Date:  12/27/2019 CLINICAL DATA:  Dyspnea. EXAM: PORTABLE CHEST 1 VIEW COMPARISON:  12/23/2019 FINDINGS: Tracheostomy tube tip is above the carina. There is a feeding tube with tip well below the level of the GE junction. Previous right shoulder arthroplasty. Bilateral pleural effusions are identified. Diffuse increase interstitial markings are noted bilaterally, unchanged from previous exam. Associated decreased aeration of both lung bases noted. IMPRESSION: 1. Persistent bilateral pleural effusions and interstitial edema compatible with moderate to severe CHF. 2. Stable support apparatus. Electronically Signed   By: Kerby Moors M.D.   On: 12/27/2019 10:08    Medications: Infusions: . sodium chloride Stopped (12/25/19 1702)  . feeding supplement (JEVITY 1.2 CAL) 1,000 mL (12/27/19 0443)  . ferumoxytol 510 mg (12/23/19 0917)    Scheduled Medications: . sodium chloride   Intravenous Once  . atorvastatin  80 mg Per Tube QHS  . bethanechol  10 mg Per Tube TID  . budesonide (PULMICORT) nebulizer solution  0.5 mg Nebulization BID  . chlorhexidine gluconate (MEDLINE KIT)  15 mL Mouth Rinse BID  . Chlorhexidine Gluconate Cloth  6 each Topical Q0600  . feeding supplement (PROSource TF)  45 mL Per Tube TID  . heparin injection (subcutaneous)  5,000 Units Subcutaneous Q8H  . insulin aspart  0-15 Units Subcutaneous Q4H  . insulin glargine  15 Units Subcutaneous Daily  . ipratropium-albuterol  3 mL Nebulization TID  . mouth rinse  15 mL Mouth Rinse 10 times per day  . melatonin  3 mg Per Tube QHS  . modafinil  100 mg Per Tube QAC breakfast  . multivitamin with minerals  1 tablet Per Tube Daily  . pantoprazole sodium  40 mg Per Tube QHS  . senna-docusate  2 tablet Oral QHS    have reviewed scheduled and prn medications.  Physical Exam: General: Looks distressed with increased work of breathing, has trach on vent Heart:RRR, s1s2 nl Lungs: Bibasal rhonchi Abdomen:soft, Non-tender,  non-distended Extremities: Anasarca + Dialysis Access: None  Kelly Williams 12/27/2019,12:14 PM  LOS: 23 days  Pager: [phone]

## 2019-12-27 NOTE — Progress Notes (Signed)
Pt's daughter gives permission (and requests) that Dr Buford Dresser (Cardiology) be involved in pt's case.

## 2019-12-27 NOTE — Progress Notes (Signed)
NAME:  Kelly Williams, MRN:  [id number], DOB:  [birth year], LOS: 23 ADMISSION DATE:  12/25/2019, CONSULTATION DATE:  12/12/2019 REFERRING MD:  Oneida Alar, CHIEF COMPLAINT:  Vent management for acute respiratory failure   Brief History   79 year old male with history of recurrent R ICA occlusion post-CEA who developed respiratory failure requiring reintubation and pneumonia. He initially failed self extubation 8/5 after 24 hours off vent, and was again extubated 8/7 with seemingly good toleration. Then in the early am hours of 8/11, several hours after self-removal of Cortrak, he became unresponsive and hypoxemic requiring reintubation and transfer to ICU.  Patient is awaiting trach placement.  Past Medical History   Past Medical History:  Diagnosis Date  . Alcohol abuse   . Alcohol abuse   . Arthritis   . Carotid arterial disease ([place])   . Chronic kidney disease    CKD-patient denies  . COPD (chronic obstructive pulmonary disease) (HCC)    uses inhalers  . Coronary artery disease   . Diabetes mellitus without complication ([place])   . Diabetic retinopathy ([place])   . Hyperlipidemia   . Hypertension   . Normocytic anemia 01/16/2019  . Stroke [facility])    around 2020, 2 after bypass in 2015   Significant Hospital Events   8/3: admitted to [facility], CEA, PCCM consulted for vent management 8/4: self extubated 8/5: Re-intubated overnight 8/7: Extubated 8/11: Reintubation   Consults:  Neurology   Procedures:  8/3: ETT out 821 12/21/2019 trach per ENT 8/3: R CEA 8/3: Exploration of R CEA and R ICA thrombectomy 8/3: R art line out  Significant Diagnostic Tests:  Carotid duplex 8/3 > right ICA occlusion CTA Head/Neck 8/3 > No acute intracranial abnormality. Post repeat right carotid endarterectomy. Decreased irregularity of the proximal cervical ICA. 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 Persistent narrowing and irregularity of the distal ICA  with increased eccentric intraluminal filling defect, which may reflect unstable/migrated clot. 60-70% stenosis of the proximal left ICA. No proximal intracranial vessel occlusion. Stable findings of multifocal intracranial atherosclerosis. MRI Brain wo Contrast 8/3 > 1. Unchanged appearance of acute right MCA territory infarct. No new site of acute ischemia. 2. Unchanged generalized atrophy and findings of chronic ischemic Microangiopathy. MR Brain wo Contrast 8/6 > Unchanged appearance of a subacute right MCA territory infarct centered within the right frontal operculum as compared to the MRI of 12/13/2019. No significant mass effect or midline shift. Expected evolution of a known subacute infarct within the right parietal lobe. No interval acute infarct is demonstrated. Stable background generalized parenchymal atrophy and chronic ischemic changes with multiple chronic infarcts as detailed. CT Head wo Contrast 8/11 > 1. Stable appearance of subacute and chronic right MCA infarcts. 2. Stable remote infarcts of the high right frontal lobe and left basal ganglia. 3. No acute intracranial abnormality or significant interval change.  Micro Data:  MRSA 8/3 Negative Respiratory Cx 8/5 > Negative  Blood Cx 8/11 > Negative  Respiratory Cx 8/13 > pansensitive pseudomonas MRSA 8/16 Negative  Antimicrobials:  Cefazolin 8/3 Unasyn 8/5 > 8/12 Zosyn 8/12> 8/16 Ceftazidime 8/16> 8/17  Interim history/subjective:   Tolerating intermittent pressures support.  Objective   Blood pressure 124/74, pulse 91, temperature 98.7 F (37.1 C), temperature source Oral, resp. rate (!) 23, height 5\' 6"  (1.676 m), weight 77 kg, SpO2 97 %.    Vent Mode: PSV;CPAP FiO2 (%):  [40 %-60 %] 40 % Set Rate:  [18 bmp]  18 bmp Vt Set:  [510 mL] 510 mL PEEP:  [5 cmH20] 5 cmH20 Pressure Support:  [10 cmH20] 10 cmH20 Plateau Pressure:  [15 cmH20-25 cmH20] 25 cmH20   Intake/Output Summary (Last 24 hours) at 12/27/2019  1137 Last data filed at 12/27/2019 0900 Gross per 24 hour  Intake 1440 ml  Output 1350 ml  Net 90 ml   Filed Weights   12/21/19 0500 12/28/2019 0500 12/25/19 0500  Weight: 79.2 kg 77 kg 77 kg   Examination: General: Frail elderly male currently on full mechanical ventilatory support HEENT: Tracheostomy in place Neuro: Follows simple commands CV: Heart sounds are distant PULM:   Vent pressure support FIO2 40 PEEP 5 RATE 16  GI: soft, bsx4 active  GU: Foley catheter in place with amber urine Extremities: warm/dry,  edema  Skin: no rashes or lesions     Assessment & Plan:   Critically ill due to acute respiratory failure s/p multiple failed extubations, now trach and vent dependent  Wean from ventilator trach collar trials as tolerated Questionable long-term outcome ongoing discussions with family  AKI on CKD Lab Results  Component Value Date   CREATININE 3.90 (H) 12/27/2019   CREATININE 3.63 (H) 12/26/2019   CREATININE 3.58 (H) 12/25/2019   CREATININE 1.34 (H) 08/24/2019   CREATININE 1.23 (H) 06/14/2019   CREATININE 1.49 (H) 01/12/2019   Adequate urine output with daily Lasix per nephrology He does not look volume overloaded at this time. Creatinine slowly creeping up. Nephrology is following  Elevated LFTs: Alkaline phosphatase elevated. RUQ [place] without any biliary duct dilation. Suspect intrahepatic cholestasis. Continue to monitor intermittently  Normocytic anemia:  No results for input(s): HGB in the last 72 hours.  Transfuse per protocol 7  R CEA revision:  Per vascular surgery  Best practice:  Diet: tube feeds  Pain/Anxiety/Delirium protocol (if indicated): per protocol VAP protocol (if indicated): per protocol  DVT prophylaxis: SCDs GI prophylaxis: PPI Glucose control: Lantus + SSI Mobility: Bed rest Code Status: FULL Family Communication: Ongoing discussions with family and primary Disposition: ICU  App cct 34 min   Richardson Landry Johnathon Olden ACNP Acute  Care Nurse Practitioner [place] Please consult Amion 12/27/2019, 11:37 AM

## 2019-12-27 NOTE — Progress Notes (Signed)
Inpatient Diabetes Program Recommendations  AACE/ADA: New Consensus Statement on Inpatient Glycemic Control (2015)  Target Ranges:  Prepandial:   less than 140 mg/dL      Peak postprandial:   less than 180 mg/dL (1-2 hours)      Critically ill patients:  140 - 180 mg/dL   Lab Results  Component Value Date   GLUCAP 270 (H) 12/27/2019   HGBA1C 6.3 (H) 11/27/2019    Review of Glycemic Control Results for Kelly Williams, Kelly Williams (MRN [id number]) as of 12/27/2019 13:52  Ref. Range 12/26/2019 19:41 12/26/2019 23:21 12/27/2019 03:30 12/27/2019 07:50 12/27/2019 11:21  Glucose-Capillary Latest Ref Range: 70 - 99 mg/dL 149 (H) 132 (H) 177 (H) 245 (H) 270 (H)     Inpatient Diabetes Program Recommendations:    If CBG's continue to be elevated, please consider, Novolog 3 units Q4hr tube feed coverage.  Stop if feeds held or discontinued.   Will continue to follow while inpatient.  Thank you, Reche Dixon, RN, BSN Diabetes Coordinator Inpatient Diabetes Program [phone] (team pager from 8a-5p)

## 2019-12-27 NOTE — Progress Notes (Signed)
Physical Therapy Treatment Patient Details Name: Kelly Williams MRN: [id number] DOB: November 05, 1940 Today's Date: 12/27/2019    History of Present Illness Pt is a 79 y/o male with PMH of stroke, HTN, DM, periphreal neuropathy, carotid disease with L carotid endarterectomy, CABG x 5, R shoulder arthroplasthy, medication noncompliance and alcohol abuse. Daughter found patient after falling and intoxicated, L facial droop, slurring speech and aggression (reports hx of mulitple falls). MRI reveals acute ischemic infarcts of R frontal and parietal lobes. MRA of neck revealed focal stenosis in the right internal carotid artery approximately 3 cm distal to the bifurcation with an adjacent 11 mm aneurysmal dilatation. Pt underwent R carotid endarterectomy on 8/3. Pt with change in neuro status after procedure and was taken back to OR for exploration with no significant findings. Pt required intubation on 8/5 due to desaturation, extubated 12/08/19.  Reintubated on 8/11 due to hypoxia and transferred to ICU. Pt underwent tracheostomy on 8/21.    PT Comments    Pt limited by nausea and fatigue during session. Pt continues to demonstrate good UE strength, utilizing arms to support sitting balance and to pull into sitting position due to lack of core strength. Pt fatigues quickly when sitting and requests a return to supine due to reports of nausea, RN made aware. Pt will benefit from continued acute PT POC to reduce falls risk and caregiver burden. PT continues to recommend SNF vs LTACH pending medical necessity.   Follow Up Recommendations  SNF     Equipment Recommendations  Wheelchair (measurements PT);Wheelchair cushion (measurements PT);Hospital bed    Recommendations for Other Services       Precautions / Restrictions Precautions Precautions: Fall Precaution Comments: Trach, vent Restrictions Weight Bearing Restrictions: No    Mobility  Bed Mobility Overal bed mobility: Needs Assistance Bed  Mobility: Supine to Sit;Sit to Supine     Supine to sit: Max assist Sit to supine: Max assist   General bed mobility comments: pt performs supine to sit and back 4 times during session. Trach to vent losing connection initial 3 times, RN assisting to maintain vent placement on trach for final attempt  Transfers                    Ambulation/Gait                 Stairs             Wheelchair Mobility    Modified Rankin (Stroke Patients Only) Modified Rankin (Stroke Patients Only) Pre-Morbid Rankin Score: Moderate disability Modified Rankin: Severe disability     Balance Overall balance assessment: Needs assistance Sitting-balance support: Feet unsupported;Bilateral upper extremity supported Sitting balance-Leahy Scale: Poor Sitting balance - Comments: modA Postural control: Posterior lean                                  Cognition Arousal/Alertness: Awake/alert Behavior During Therapy: Flat affect Overall Cognitive Status: Difficult to assess Area of Impairment: Following commands;Attention                   Current Attention Level: Focused   Following Commands: Follows one step commands consistently              Exercises      General Comments General comments (skin integrity, edema, etc.): pt reports nausea and declines attempts at bed-level exercise after bed mobility      Pertinent  Vitals/Pain Pain Assessment: Faces Faces Pain Scale: Hurts little more Pain Location: discomfort Pain Descriptors / Indicators: Discomfort;Guarding Pain Intervention(s): Monitored during session    Home Living                      Prior Function            PT Goals (current goals can now be found in the care plan section) Acute Rehab PT Goals Patient Stated Goal: none stated this session Progress towards PT goals: Not progressing toward goals - comment (limited by fatigue and nausea)    Frequency    Min  2X/week      PT Plan Current plan remains appropriate    Co-evaluation              AM-PAC PT "6 Clicks" Mobility   Outcome Measure  Help needed turning from your back to your side while in a flat bed without using bedrails?: Total Help needed moving from lying on your back to sitting on the side of a flat bed without using bedrails?: Total Help needed moving to and from a bed to a chair (including a wheelchair)?: Total Help needed standing up from a chair using your arms (e.g., wheelchair or bedside chair)?: Total Help needed to walk in hospital room?: Total Help needed climbing 3-5 steps with a railing? : Total 6 Click Score: 6    End of Session Equipment Utilized During Treatment: Oxygen Activity Tolerance: Treatment limited secondary to medical complications (Comment) Patient left: in bed;with call bell/phone within reach;with SCD's reapplied Nurse Communication: Mobility status PT Visit Diagnosis: Other abnormalities of gait and mobility (R26.89);Other symptoms and signs involving the nervous system (R29.898)     Time: 1245-1310 PT Time Calculation (min) (ACUTE ONLY): 25 min  Charges:  $Therapeutic Activity: 23-37 mins                     Zenaida Niece, PT, DPT Acute Rehabilitation Pager: [phone]    Zenaida Niece 12/27/2019, 1:33 PM

## 2019-12-28 ENCOUNTER — Inpatient Hospital Stay (HOSPITAL_COMMUNITY): Payer: Medicare HMO

## 2019-12-28 DIAGNOSIS — I340 Nonrheumatic mitral (valve) insufficiency: Secondary | ICD-10-CM

## 2019-12-28 LAB — POCT ACTIVATED CLOTTING TIME
Activated Clotting Time: 158 seconds
Activated Clotting Time: 164 seconds
Activated Clotting Time: 169 seconds
Activated Clotting Time: 169 seconds
Activated Clotting Time: 175 seconds
Activated Clotting Time: 180 s
Activated Clotting Time: 180 s
Activated Clotting Time: 180 seconds
Activated Clotting Time: 186 s
Activated Clotting Time: 197 s
Activated Clotting Time: 197 s
Activated Clotting Time: 197 s
Activated Clotting Time: 202 seconds
Activated Clotting Time: 208 seconds
Activated Clotting Time: 208 seconds
Activated Clotting Time: 213 seconds
Activated Clotting Time: 213 seconds
Activated Clotting Time: 213 seconds
Activated Clotting Time: 219 seconds
Activated Clotting Time: 219 seconds
Activated Clotting Time: 219 seconds
Activated Clotting Time: 219 seconds
Activated Clotting Time: 219 seconds
Activated Clotting Time: 224 seconds
Activated Clotting Time: 224 seconds
Activated Clotting Time: 224 seconds
Activated Clotting Time: 224 seconds
Activated Clotting Time: 230 seconds

## 2019-12-28 LAB — CBC WITH DIFFERENTIAL/PLATELET
Abs Immature Granulocytes: 0.09 10*3/uL — ABNORMAL HIGH (ref 0.00–0.07)
Basophils Absolute: 0.1 10*3/uL (ref 0.0–0.1)
Basophils Relative: 0 %
Eosinophils Absolute: 0.2 10*3/uL (ref 0.0–0.5)
Eosinophils Relative: 1 %
HCT: 25.8 % — ABNORMAL LOW (ref 39.0–52.0)
Hemoglobin: 8 g/dL — ABNORMAL LOW (ref 13.0–17.0)
Immature Granulocytes: 1 %
Lymphocytes Relative: 5 %
Lymphs Abs: 0.8 10*3/uL (ref 0.7–4.0)
MCH: 29.6 pg (ref 26.0–34.0)
MCHC: 31 g/dL (ref 30.0–36.0)
MCV: 95.6 fL (ref 80.0–100.0)
Monocytes Absolute: 1.2 10*3/uL — ABNORMAL HIGH (ref 0.1–1.0)
Monocytes Relative: 8 %
Neutro Abs: 13.2 10*3/uL — ABNORMAL HIGH (ref 1.7–7.7)
Neutrophils Relative %: 85 %
Platelets: 419 10*3/uL — ABNORMAL HIGH (ref 150–400)
RBC: 2.7 MIL/uL — ABNORMAL LOW (ref 4.22–5.81)
RDW: 15.7 % — ABNORMAL HIGH (ref 11.5–15.5)
WBC: 15.5 10*3/uL — ABNORMAL HIGH (ref 4.0–10.5)
nRBC: 0 % (ref 0.0–0.2)

## 2019-12-28 LAB — RENAL FUNCTION PANEL
Albumin: 2.9 g/dL — ABNORMAL LOW (ref 3.5–5.0)
Albumin: 3 g/dL — ABNORMAL LOW (ref 3.5–5.0)
Anion gap: 13 (ref 5–15)
Anion gap: 15 (ref 5–15)
BUN: 108 mg/dL — ABNORMAL HIGH (ref 8–23)
BUN: 69 mg/dL — ABNORMAL HIGH (ref 8–23)
CO2: 27 mmol/L (ref 22–32)
CO2: 24 mmol/L (ref 22–32)
Calcium: 8.3 mg/dL — ABNORMAL LOW (ref 8.9–10.3)
Calcium: 8.5 mg/dL — ABNORMAL LOW (ref 8.9–10.3)
Chloride: 97 mmol/L — ABNORMAL LOW (ref 98–111)
Chloride: 99 mmol/L (ref 98–111)
Creatinine, Ser: 2.44 mg/dL — ABNORMAL HIGH (ref 0.61–1.24)
Creatinine, Ser: 1.66 mg/dL — ABNORMAL HIGH (ref 0.61–1.24)
GFR calc Af Amer: 28 mL/min — ABNORMAL LOW (ref >60)
GFR calc Af Amer: 45 mL/min — ABNORMAL LOW (ref >60)
GFR calc non Af Amer: 24 mL/min — ABNORMAL LOW (ref >60)
GFR calc non Af Amer: 39 mL/min — ABNORMAL LOW (ref >60)
Glucose, Bld: 150 mg/dL — ABNORMAL HIGH (ref 70–99)
Glucose, Bld: 84 mg/dL (ref 70–99)
Phosphorus: 4.7 mg/dL — ABNORMAL HIGH (ref 2.5–4.6)
Phosphorus: 3.3 mg/dL (ref 2.5–4.6)
Potassium: 4.1 mmol/L (ref 3.5–5.1)
Potassium: 3.9 mmol/L (ref 3.5–5.1)
Sodium: 137 mmol/L (ref 135–145)
Sodium: 138 mmol/L (ref 135–145)

## 2019-12-28 LAB — ECHOCARDIOGRAM COMPLETE
Height: 66 in
S' Lateral: 3.5 cm
Weight: 2716.07 oz

## 2019-12-28 LAB — MAGNESIUM: Magnesium: 2.9 mg/dL — ABNORMAL HIGH (ref 1.7–2.4)

## 2019-12-28 LAB — GLUCOSE, CAPILLARY
Glucose-Capillary: 127 mg/dL — ABNORMAL HIGH (ref 70–99)
Glucose-Capillary: 158 mg/dL — ABNORMAL HIGH (ref 70–99)
Glucose-Capillary: 152 mg/dL — ABNORMAL HIGH (ref 70–99)
Glucose-Capillary: 38 mg/dL — CL (ref 70–99)
Glucose-Capillary: 89 mg/dL (ref 70–99)
Glucose-Capillary: 81 mg/dL (ref 70–99)
Glucose-Capillary: 105 mg/dL — ABNORMAL HIGH (ref 70–99)

## 2019-12-28 LAB — APTT: aPTT: >200 seconds (ref 24–36)

## 2019-12-28 LAB — PHOSPHORUS: Phosphorus: 4.5 mg/dL (ref 2.5–4.6)

## 2019-12-28 MED ORDER — DARBEPOETIN ALFA 100 MCG/0.5ML IJ SOSY
100.0000 ug | PREFILLED_SYRINGE | Freq: Once | INTRAMUSCULAR | Status: AC
  Administered 2019-12-28: 100 ug via SUBCUTANEOUS
  Filled 2019-12-28: qty 0.5

## 2019-12-28 MED ORDER — DEXTROSE 50 % IV SOLN
1.0000 | Freq: Once | INTRAVENOUS | Status: AC
  Administered 2019-12-28: 50 mL via INTRAVENOUS

## 2019-12-28 MED ORDER — DEXTROSE 50 % IV SOLN
INTRAVENOUS | Status: AC
  Filled 2019-12-28: qty 50

## 2019-12-28 NOTE — Progress Notes (Signed)
Pt placed on CPAP/PS. Pt with good volumes, tachypnea into the 40's and desaturation with a good pleth to 84%. Pt returned to full support at this time. Pt with increased agitation. RN at bedside and notified of changes. RT will continue to monitor and be available as needed.

## 2019-12-28 NOTE — Progress Notes (Addendum)
  Progress Note    12/28/2019 7:29 AM 6 Days Post-Op  Subjective:  Alert this morning   Vitals:   12/28/19 0600 12/28/19 0700  BP: 122/76 133/74  Pulse: 92 79  Resp: (!) 26 17  Temp:    SpO2: 99% 100%   Physical Exam: Lungs:  Mechanical ventilation Incisions:  R neck incision nearly healed Extremities:  Moving all extremities well Neurologic: alert this morning  CBC    Component Value Date/Time   WBC 15.5 (H) 12/28/2019 0424   RBC 2.70 (L) 12/28/2019 0424   HGB 8.0 (L) 12/28/2019 0424   HCT 25.8 (L) 12/28/2019 0424   PLT 419 (H) 12/28/2019 0424   MCV 95.6 12/28/2019 0424   MCH 29.6 12/28/2019 0424   MCHC 31.0 12/28/2019 0424   RDW 15.7 (H) 12/28/2019 0424   LYMPHSABS 0.8 12/28/2019 0424   MONOABS 1.2 (H) 12/28/2019 0424   EOSABS 0.2 12/28/2019 0424   BASOSABS 0.1 12/28/2019 0424    BMET    Component Value Date/Time   NA 137 12/28/2019 0424   K 4.1 12/28/2019 0424   CL 97 (L) 12/28/2019 0424   CO2 27 12/28/2019 0424   GLUCOSE 150 (H) 12/28/2019 0424   BUN 108 (H) 12/28/2019 0424   CREATININE 2.44 (H) 12/28/2019 0424   CREATININE 1.34 (H) 08/24/2019 1114   CALCIUM 8.3 (L) 12/28/2019 0424   GFRNONAA 24 (L) 12/28/2019 0424   GFRNONAA 50 (L) 08/24/2019 1114   GFRAA 28 (L) 12/28/2019 0424   GFRAA Kelly (L) 08/24/2019 1114    INR    Component Value Date/Time   INR 1.2 12/06/2019 0453     Intake/Output Summary (Last 24 hours) at 12/28/2019 0729 Last data filed at 12/28/2019 0700 Gross per 24 hour  Intake 1605 ml  Output 3673 ml  Net -2068 ml     Assessment/Plan:  79 y.o. Williams is s/p redo R CEA with post op respiratory and renal failure 6 Days Post-Op   Neuro exam remains at baseline; R neck incision nearly healed CRRT started yesterday afternoon with hopes to help respiratory status Continue tube feeds for malnutrition   [name], PA-C [facility] [phone] 12/28/2019 7:29 AM  More interactive today Hopefully off  vent with exercise on trach Oliguric while on HD. Hopefully some renal recovery over next few days   Ruta Hinds, MD [facility] [place] Office: [phone]

## 2019-12-28 NOTE — Progress Notes (Signed)
SLP Cancellation Note  Patient Details Name: Kelly Williams MRN: [id number] DOB: Dec 22, 1940   Cancelled treatment: C/M; remains on vent; trach collar trials to begin tomorrow per PCCM.  Will follow for readiness for PMV assessment.  Shea Swalley L. Tivis Ringer, [place] Office number [phone] Pager [phone]           Assunta Curtis 12/28/2019, 3:39 PM

## 2019-12-28 NOTE — Progress Notes (Signed)
Progress Note  Patient Name: Kelly Williams Date of Encounter: 12/28/2019  [facility] Cardiologist: Buford Dresser, MD   Subjective   Seen during echo today. Alert, recognizes me. Remains on vent via trach.  Inpatient Medications    Scheduled Meds: . sodium chloride   Intravenous Once  . atorvastatin  80 mg Per Tube QHS  . budesonide (PULMICORT) nebulizer solution  0.5 mg Nebulization BID  . chlorhexidine gluconate (MEDLINE KIT)  15 mL Mouth Rinse BID  . Chlorhexidine Gluconate Cloth  6 each Topical Q0600  . darbepoetin (ARANESP) injection - NON-DIALYSIS  100 mcg Subcutaneous Once  . feeding supplement (PROSource TF)  45 mL Per Tube TID  . heparin injection (subcutaneous)  5,000 Units Subcutaneous Q8H  . insulin aspart  0-15 Units Subcutaneous Q4H  . insulin glargine  15 Units Subcutaneous Daily  . ipratropium-albuterol  3 mL Nebulization TID  . mouth rinse  15 mL Mouth Rinse 10 times per day  . melatonin  3 mg Per Tube QHS  . modafinil  100 mg Per Tube QAC breakfast  . multivitamin with minerals  1 tablet Per Tube Daily  . pantoprazole sodium  40 mg Per Tube QHS  . senna-docusate  2 tablet Oral QHS   Continuous Infusions: .  prismasol BGK 4/2.5 500 mL/hr at 12/28/19 1348  .  prismasol BGK 4/2.5 300 mL/hr at 12/28/19 1025  . sodium chloride Stopped (12/25/19 1702)  . feeding supplement (JEVITY 1.2 CAL) 55 mL/hr at 12/28/19 1500  . ferumoxytol 510 mg (12/23/19 0917)  . heparin 10,000 units/ 20 mL infusion syringe 1,000 Units/hr (12/28/19 1520)  . prismasol BGK 4/2.5 1,500 mL/hr at 12/28/19 1401   PRN Meds: sodium chloride, acetaminophen **OR** acetaminophen, fentaNYL (SUBLIMAZE) injection, fentaNYL (SUBLIMAZE) injection, heparin, heparin, hydrALAZINE, ipratropium-albuterol, metoprolol tartrate, ondansetron (ZOFRAN) IV, phenol, polyethylene glycol, potassium chloride   Vital Signs    Vitals:   12/28/19 1300 12/28/19 1400 12/28/19 1430 12/28/19 1500  BP: (!)  144/93 139/81 139/81 (!) 149/81  Pulse: 82 90  88  Resp: 20 19 (!) 22 18  Temp:      TempSrc:      SpO2: 100% 100% 100% 100%  Weight:      Height:        Intake/Output Summary (Last 24 hours) at 12/28/2019 1524 Last data filed at 12/28/2019 1500 Gross per 24 hour  Intake 1595 ml  Output 4985 ml  Net -3390 ml   Last 3 Weights 12/25/2019 12/07/2019 12/21/2019  Weight (lbs) 169 lb 12.1 oz 169 lb 12.1 oz 174 lb 9.7 oz  Weight (kg) 77 kg 77 kg 79.2 kg      Telemetry    Atrial fibrillation - Personally Reviewed  ECG    No new - Personally Reviewed  Physical Exam   GEN: Frail appearing, on vent via trach Neck: trach site c/d/i Cardiac: irregularly irregular. Did not appreciate murmurs over ventilator sounds Respiratory: Coarse but improved from yesterday, mechanical vent sounds GI: Soft, nontender, non-distended  MS: Diffuse edema, trace to 1+; No deformity. Neuro:  Nonfocal  Psych: Normal affect   Labs    High Sensitivity Troponin:  No results for input(s): TROPONINIHS in the last 720 hours.    Chemistry Recent Labs  Lab 12/05/2019 0453 12/23/19 0441 12/27/19 0550 12/27/19 0550 12/27/19 1607 12/27/19 1705 12/28/19 0424  NA 133*   < > 139   < > 139 140 137  K 4.8   < > 3.8   < >  3.7 3.6 4.1  CL 95*   < > 93*  --  93*  --  97*  CO2 25   < > 27  --  29  --  27  GLUCOSE 154*   < > 215*  --  164*  --  150*  BUN 124*   < > 169*  --  172*  --  108*  CREATININE 2.77*   < > 3.90*  --  3.92*  --  2.44*  CALCIUM 8.6*   < > 8.4*  --  8.5*  --  8.3*  PROT 6.5  --   --   --   --   --   --   ALBUMIN 3.0*   < > 2.8*  --  2.8*  --  2.9*  AST 82*  --   --   --   --   --   --   ALT 34  --   --   --   --   --   --   ALKPHOS 340*  --   --   --   --   --   --   BILITOT 1.5*  --   --   --   --   --   --   GFRNONAA 21*   < > 14*  --  14*  --  24*  GFRAA 24*   < > 16*  --  16*  --  28*  ANIONGAP 13   < > 19*  --  17*  --  13   < > = values in this interval not displayed.      Hematology Recent Labs  Lab 12/25/2019 0453 12/27/19 1705 12/28/19 0424  WBC 11.9*  --  15.5*  RBC 2.47*  --  2.70*  HGB 7.6* 9.2* 8.0*  HCT 23.4* 27.0* 25.8*  MCV 94.7  --  95.6  MCH 30.8  --  29.6  MCHC 32.5  --  31.0  RDW 14.8  --  15.7*  PLT 521*  --  419*    BNPNo results for input(s): BNP, PROBNP in the last 168 hours.   DDimer No results for input(s): DDIMER in the last 168 hours.   Radiology    DG Chest Port 1 View  Result Date: 12/28/2019 CLINICAL DATA:  Tracheostomy. EXAM: PORTABLE CHEST 1 VIEW COMPARISON:  12/27/2019. FINDINGS: Tracheostomy tube, feeding tube, right central line in stable position. Prior CABG. Heart size stable. Diffuse bilateral pulmonary infiltrates/edema again noted with slight improvement aeration from prior exam. Small right pleural effusion. No pneumothorax. Postsurgical changes right shoulder. IMPRESSION: 1.  Lines and tubes stable position. 2.  Prior CABG.  Heart size stable. 3. Diffuse bilateral pulmonary infiltrates/edema again noted with slight improvement in aeration from prior exam. Small right pleural effusion. Electronically Signed   By: Marcello Moores  Register   On: 12/28/2019 06:39   DG CHEST PORT 1 VIEW  Result Date: 12/27/2019 CLINICAL DATA:  Central line placement EXAM: PORTABLE CHEST 1 VIEW COMPARISON:  12/27/2019 FINDINGS: Interval placement of right subclavian dialysis catheter with the tip in the SVC. Tracheostomy tube and feeding tube are unchanged. Cardiomegaly. Bilateral airspace disease again noted, unchanged. IMPRESSION: Right subclavian dialysis catheter placement.  No pneumothorax. Stable diffuse bilateral airspace disease. Electronically Signed   By: Rolm Baptise M.D.   On: 12/27/2019 15:54   DG CHEST PORT 1 VIEW  Result Date: 12/27/2019 CLINICAL DATA:  Dyspnea. EXAM: PORTABLE CHEST 1 VIEW COMPARISON:  12/23/2019 FINDINGS:  Tracheostomy tube tip is above the carina. There is a feeding tube with tip well below the level of the GE  junction. Previous right shoulder arthroplasty. Bilateral pleural effusions are identified. Diffuse increase interstitial markings are noted bilaterally, unchanged from previous exam. Associated decreased aeration of both lung bases noted. IMPRESSION: 1. Persistent bilateral pleural effusions and interstitial edema compatible with moderate to severe CHF. 2. Stable support apparatus. Electronically Signed   By: Kerby Moors M.D.   On: 12/27/2019 10:08   ECHOCARDIOGRAM COMPLETE  Result Date: 12/28/2019    ECHOCARDIOGRAM REPORT   Patient Name:   MAYAN KLOEPFER Date of Exam: 12/28/2019 Medical Rec #:  [id number]      Height:       66.0 in Accession #:    [id number]     Weight:       169.8 lb Date of Birth:  1941/03/06      BSA:          1.865 m Patient Age:    79 years       BP:           128/68 mmHg Patient Gender: M              HR:           88 bpm. Exam Location:  Inpatient Procedure: 2D Echo Indications:    volume overload  History:        Patient has prior history of Echocardiogram examinations, most                 recent 11/27/2019. CAD, Prior CABG, COPD and Stroke; Risk                 Factors:Diabetes, Hypertension, Dyslipidemia and Former Smoker.                 ETOH Abuse.  Sonographer:    Jannett Celestine RDCS (AE) Referring Phys: [phone] [facility]  Sonographer Comments: Suboptimal apical window and echo performed with patient supine and on artificial respirator. Image acquisition challenging due to respiratory motion. patient had difficulty remaining still at times, which made imaging challenging. no apical window available in a more lateral position. apical window were consequently off axis IMPRESSIONS  1. Left ventricular ejection fraction, by estimation, is 50 to 55%. The left ventricle has low normal function. The left ventricle has no regional wall motion abnormalities. Left ventricular diastolic parameters are indeterminate. There is the interventricular septum is flattened in systole and  diastole, consistent with right ventricular pressure and volume overload.  2. Right ventricular systolic function is normal. The right ventricular size is normal.  3. The mitral valve is normal in structure. Mild to moderate mitral valve regurgitation. No evidence of mitral stenosis.  4. The aortic valve is tricuspid. Aortic valve regurgitation is not visualized. Mild to moderate aortic valve sclerosis/calcification is present, without any evidence of aortic stenosis.  5. The inferior vena cava is normal in size with greater than 50% respiratory variability, suggesting right atrial pressure of 3 mmHg. Comparison(s): EF is mildly improved from prior echocardiogram. FINDINGS  Left Ventricle: Left ventricular ejection fraction, by estimation, is 50 to 55%. The left ventricle has low normal function. The left ventricle has no regional wall motion abnormalities. The left ventricular internal cavity size was normal in size. There is no left ventricular hypertrophy. The interventricular septum is flattened in systole and diastole, consistent with right ventricular pressure and volume overload. Left ventricular diastolic parameters are indeterminate. Right  Ventricle: The right ventricular size is normal. No increase in right ventricular wall thickness. Right ventricular systolic function is normal. Left Atrium: Left atrial size was normal in size. Right Atrium: Right atrial size was normal in size. Pericardium: There is no evidence of pericardial effusion. Mitral Valve: The mitral valve is normal in structure. There is mild thickening of the mitral valve leaflet(s). Normal mobility of the mitral valve leaflets. Mild to moderate mitral valve regurgitation. No evidence of mitral valve stenosis. Tricuspid Valve: The tricuspid valve is normal in structure. Tricuspid valve regurgitation is not demonstrated. No evidence of tricuspid stenosis. Aortic Valve: The aortic valve is tricuspid. Aortic valve regurgitation is not  visualized. Mild to moderate aortic valve sclerosis/calcification is present, without any evidence of aortic stenosis. Pulmonic Valve: The pulmonic valve was normal in structure. Pulmonic valve regurgitation is not visualized. No evidence of pulmonic stenosis. Aorta: The aortic root is normal in size and structure. Venous: The inferior vena cava is normal in size with greater than 50% respiratory variability, suggesting right atrial pressure of 3 mmHg. IAS/Shunts: No atrial level shunt detected by color flow Doppler.  LEFT VENTRICLE PLAX 2D LVIDd:         4.80 cm LVIDs:         3.50 cm LV PW:         1.10 cm LV IVS:        1.10 cm LVOT diam:     2.10 cm LV SV:         48 LV SV Index:   26 LVOT Area:     3.46 cm  LEFT ATRIUM           Index LA diam:      4.30 cm 2.31 cm/m LA Vol (A4C): 24.9 ml 13.35 ml/m  AORTIC VALVE LVOT Vmax:   64.60 cm/s LVOT Vmean:  49.800 cm/s LVOT VTI:    0.138 m  AORTA Ao Root diam: 3.30 cm  SHUNTS Systemic VTI:  0.14 m Systemic Diam: 2.10 cm Candee Furbish MD Electronically signed by Candee Furbish MD Signature Date/Time: 12/28/2019/11:55:30 AM    Final     Cardiac Studies   Echo 12/28/2019 1. Left ventricular ejection fraction, by estimation, is 50 to 55%. The  left ventricle has low normal function. The left ventricle has no regional  wall motion abnormalities. Left ventricular diastolic parameters are  indeterminate. There is the  interventricular septum is flattened in systole and diastole, consistent  with right ventricular pressure and volume overload.  2. Right ventricular systolic function is normal. The right ventricular  size is normal.  3. The mitral valve is normal in structure. Mild to moderate mitral valve  regurgitation. No evidence of mitral stenosis.  4. The aortic valve is tricuspid. Aortic valve regurgitation is not  visualized. Mild to moderate aortic valve sclerosis/calcification is  present, without any evidence of aortic stenosis.  5. The inferior  vena cava is normal in size with greater than 50%  respiratory variability, suggesting right atrial pressure of 3 mmHg.   Comparison(s): EF is mildly improved from prior echocardiogram.   Patient Profile     79 y.o. male with a hx of PAD, CAD, COPD, type 2 diabetes, hypertension, hyperlipidemia, CVA, carotid artery stenosis s/p CEA who is being seen today for the evaluation of atrial fibrillation, possible heart failure at the request of Dr. Oneida Alar and patient's family (daughter is Veronda Prude, [place] at [employer]).  Assessment & Plan    CVA,  s/p R CEA 8.3.21 with postop neuro change, carotid doppler showing complete occlusion, taken back to OR for revision but no clear clots or other issues found. Complex course since that time, including respiratory failure with several intubations and ultimately tracheostomy on 12/11/2019; also with acute kidney injury on chronic kidney disease stage 3, with start of CRRT 12/27/19 -receiving coordinated care per vascular surgery, nephrology, pulm/critical care, and palliative care.  -continued on atorvastatin -antiplatelet currently on hold, restart clopidogrel when OK from vascular standpoint, recommend as soon as able  Volume overload, noted as concerning for possible cardiac or cardiorenal cause: -echo today shows preserved EF, no right heart pressures available but somewhat flattened interventricular septum suggesting RV pressure/volume overload -does not appear to be a primary cardiac cause for volume overload based on echo  Atrial fibrillation: likely driven by critical illness.  -on heparin via CVVH -CHA2DS2/VAS Stroke Risk Points=7 -when stable, will need long term anticoagulation -rate controlled on no standing meds, has PRN IV metoprolol ordered  Continues to be a complicated case. I updated [name] on the echo results.   For questions or updates, please contact [place] Please consult [email or web address] for contact info  under     Signed, Buford Dresser, MD  12/28/2019, 3:24 PM

## 2019-12-28 NOTE — Progress Notes (Signed)
NAME:  Kelly Williams, MRN:  [id number], DOB:  1940/05/09, LOS: 24 ADMISSION DATE:  12/05/2019, CONSULTATION DATE:  12/12/2019 REFERRING MD:  Oneida Alar, CHIEF COMPLAINT:  Vent management for acute respiratory failure   Brief History   79 year old male with history of recurrent R ICA occlusion post-CEA who developed respiratory failure requiring reintubation and pneumonia. He initially failed self extubation 8/5 after 24 hours off vent, and was again extubated 8/7 with seemingly good toleration. Then in the early am hours of 8/11, several hours after self-removal of Cortrak, he became unresponsive and hypoxemic requiring reintubation and transfer to ICU.  Patient is awaiting trach placement.  Past Medical History   Past Medical History:  Diagnosis Date  . Alcohol abuse   . Alcohol abuse   . Arthritis   . Carotid arterial disease ([place])   . Chronic kidney disease    CKD-patient denies  . COPD (chronic obstructive pulmonary disease) (HCC)    uses inhalers  . Coronary artery disease   . Diabetes mellitus without complication ([place])   . Diabetic retinopathy ([place])   . Hyperlipidemia   . Hypertension   . Normocytic anemia 01/16/2019  . Stroke [facility])    around 2020, 2 after bypass in 2015   Significant Hospital Events   8/3: admitted to [facility], CEA, PCCM consulted for vent management 8/4: self extubated 8/5: Re-intubated overnight 8/7: Extubated 8/11: Reintubation   Consults:  Neurology   Procedures:  8/3: ETT out 821 12/21/2019 trach per ENT 8/3: R CEA 8/3: Exploration of R CEA and R ICA thrombectomy 8/3: R art line out 12/27/2019 right subclavian hemodialysis catheter Significant Diagnostic Tests:  Carotid duplex 8/3 > right ICA occlusion CTA Head/Neck 8/3 > No acute intracranial abnormality. Post repeat right carotid endarterectomy. Decreased irregularity of the proximal cervical ICA. 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 Persistent  narrowing and irregularity of the distal ICA with increased eccentric intraluminal filling defect, which may reflect unstable/migrated clot. 60-70% stenosis of the proximal left ICA. No proximal intracranial vessel occlusion. Stable findings of multifocal intracranial atherosclerosis. MRI Brain wo Contrast 8/3 > 1. Unchanged appearance of acute right MCA territory infarct. No new site of acute ischemia. 2. Unchanged generalized atrophy and findings of chronic ischemic Microangiopathy. MR Brain wo Contrast 8/6 > Unchanged appearance of a subacute right MCA territory infarct centered within the right frontal operculum as compared to the MRI of 12/20/2019. No significant mass effect or midline shift. Expected evolution of a known subacute infarct within the right parietal lobe. No interval acute infarct is demonstrated. Stable background generalized parenchymal atrophy and chronic ischemic changes with multiple chronic infarcts as detailed. CT Head wo Contrast 8/11 > 1. Stable appearance of subacute and chronic right MCA infarcts. 2. Stable remote infarcts of the high right frontal lobe and left basal ganglia. 3. No acute intracranial abnormality or significant interval change.  Micro Data:  MRSA 8/3 Negative Respiratory Cx 8/5 > Negative  Blood Cx 8/11 > Negative  Respiratory Cx 8/13 > pansensitive pseudomonas MRSA 8/16 Negative  Antimicrobials:  Cefazolin 8/3 Unasyn 8/5 > 8/12 Zosyn 8/12> 8/16 Ceftazidime 8/16> 8/17  Interim history/subjective:   Currently on full mechanical ventilatory support.  Currently on dialysis with improving mentation.  Objective   Blood pressure (!) 156/86, pulse 88, temperature 98 F (36.7 C), temperature source Oral, resp. rate 13, height 5\' 6"  (1.676 m), weight 77 kg, SpO2 100 %.    Vent Mode: PRVC  FiO2 (%):  [40 %] 40 % Set Rate:  [18 bmp] 18 bmp Vt Set:  [510 mL] 510 mL PEEP:  [5 cmH20] 5 cmH20 Pressure Support:  [10 cmH20] 10 cmH20 Plateau  Pressure:  [16 cmH20-25 cmH20] 16 cmH20   Intake/Output Summary (Last 24 hours) at 12/28/2019 0936 Last data filed at 12/28/2019 0900 Gross per 24 hour  Intake 1650 ml  Output 4000 ml  Net -2350 ml   Filed Weights   12/21/19 0500 12/11/2019 0500 12/25/19 0500  Weight: 79.2 kg 77 kg 77 kg   Examination: General: Thin frail male who is more awake HEENT: Tracheostomy is in place connected to ventilator Neuro: Much more awake follows commands moves all extremities CV: Heart sounds are distant but regular PULM: Diminished in the bases Vent pressure regulated volume control FIO2 40% PEEP 5 RATE 16+6 VT 510 cc GI: soft, bsx4 active  GU: Extremities: warm/dry, 1+ edema  Skin: no rashes or lesions      Assessment & Plan:   Critically ill due to acute respiratory failure s/p multiple failed extubations, now trach and vent dependent  Weaning from ventilator More awake now he is being dialyzed Trach collar as tolerated  AKI on CKD Lab Results  Component Value Date   CREATININE 2.44 (H) 12/28/2019   CREATININE 3.92 (H) 12/27/2019   CREATININE 3.90 (H) 12/27/2019   CREATININE 1.34 (H) 08/24/2019   CREATININE 1.23 (H) 06/14/2019   CREATININE 1.49 (H) 01/12/2019   Started on hemodialysis 12/27/2019 Management per renal He is obviously more awake since he started hemodialysis Creatinine is improving  Elevated LFTs: Alkaline phosphatase elevated. RUQ [place] without any biliary duct dilation. Suspect intrahepatic cholestasis. Intermittent monitoring  Normocytic anemia:  Recent Labs    12/27/19 1705 12/28/19 0424  HGB 9.2* 8.0*    Transfuse per protocol  R CEA revision:  Per vascular surgery  Best practice:  Diet: tube feeds  Pain/Anxiety/Delirium protocol (if indicated): per protocol VAP protocol (if indicated): per protocol  DVT prophylaxis: SCDs GI prophylaxis: PPI Glucose control: Lantus + SSI Mobility: Bed rest Code Status: FULL Family Communication: Ongoing  discussions with family and primary Disposition: ICU  App cct 33 min   Richardson Landry Kyrstin Campillo ACNP Acute Care Nurse Practitioner [place] Please consult Amion 12/28/2019, 9:36 AM

## 2019-12-28 NOTE — Progress Notes (Addendum)
Kelly Williams PROGRESS NOTE  Assessment/ Plan: Pt is a 79 y.o. yo male  medical history significant for alcohol use disorder, PAD, COPD CAD, DM 2, HLD, HTN, CVAwho present w/endarterectomy revision complicated by ventilatory dependence and AKI  # Severe nonoliguric AKI on CKD 3:  Likely due to cardiorenal syndrome.  Renal ultrasound without obstruction.  Complicated hospital course with fluctuation in serum creatinine level.  Started CRRT on 8/26 for significant volume overload and uremia.  He has been tolerating well so far.  Mental status and breathing is much better.  UF as tolerated with BP, around 150 cc/h now. Hopefully, his kidney function eventually improve. If he remains on vent and became dialysis dependent then it will be difficult disposition.  I have discussed with his daughter at length on 8/26.  Palliative care is following.  Use 4K bath, IV heparin for systemic anticoagulation.    #Anticoagulation: High PTT however ACT within range.  Discussed with the nurse, continue current dose of heparin and monitor ACT level.  #HTN/Volume Status: Blood pressure acceptable.  UF with CRRT.  #Acute hypoxic respiratory failure/ventilatory dependence: Management per primary team.  He has tracheostomy and on vent.  #Acute pulmonary edema/CHF: Ultrafiltration with dialysis as discussed above.[name]  #Anemia multifactorial including kidney disease, chronic illness: Continue to monitor.  Ferritin 325.  Iron saturation of 12.  Feraheme x2 doses.  Monitor hemoglobin.  Order a dose of Aranesp.  # Hyponatremia hypervolemic: Managing with dialysis.  #Hyperkalemia: Resolved.    Subjective: Seen and examined.  Tolerating CRRT well.  He looks more alert awake and comfortable today. Review of system limited. Objective Vital signs in last 24 hours: Vitals:   12/28/19 0800 12/28/19 0900 12/28/19 1000 12/28/19 1100  BP: (!) 135/58 (!) 156/86 128/68 120/69  Pulse: 76 88 96 87   Resp: (!) _0 Temp:      TempSrc:      SpO2: 100% 100% 96% 100%  Weight:      Height:       Weight change:   Intake/Output Summary (Last 24 hours) at 12/28/2019 1144 Last data filed at 12/28/2019 1100 Gross per 24 hour  Intake 1705 ml  Output 4439 ml  Net -2734 ml       Labs: Basic Metabolic Panel: Recent Labs  Lab 12/27/19 0550 12/27/19 0550 12/27/19 1607 12/27/19 1705 12/28/19 0424  NA 139   < > 139 140 137  K 3.8   < > 3.7 3.6 4.1  CL 93*  --  93*  --  97*  CO2 27  --  29  --  27  GLUCOSE 215*  --  164*  --  150*  BUN 169*  --  172*  --  108*  CREATININE 3.90*  --  3.92*  --  2.44*  CALCIUM 8.4*  --  8.5*  --  8.3*  PHOS 7.9*  --  7.8*  --  4.5  4.7*   < > = values in this interval not displayed.   Liver Function Tests: Recent Labs  Lab 01/01/2020 0453 12/23/19 0441 12/27/19 0550 12/27/19 1607 12/28/19 0424  AST 82*  --   --   --   --   ALT 34  --   --   --   --   ALKPHOS 340*  --   --   --   --   BILITOT 1.5*  --   --   --   --  PROT 6.5  --   --   --   --   ALBUMIN 3.0*   < > 2.8* 2.8* 2.9*   < > = values in this interval not displayed.   No results for input(s): LIPASE, AMYLASE in the last 168 hours. No results for input(s): AMMONIA in the last 168 hours. CBC: Recent Labs  Lab 12/19/2019 0453 12/27/19 1705 12/28/19 0424  WBC 11.9*  --  15.5*  NEUTROABS  --   --  13.2*  HGB 7.6* 9.2* 8.0*  HCT 23.4* 27.0* 25.8*  MCV 94.7  --  95.6  PLT 521*  --  419*   Cardiac Enzymes: No results for input(s): CKTOTAL, CKMB, CKMBINDEX, TROPONINI in the last 168 hours. CBG: Recent Labs  Lab 12/27/19 1121 12/27/19 1648 12/27/19 1912 12/27/19 2312 12/28/19 0752  GLUCAP 270* 155* 102* 93 158*    Iron Studies:  No results for input(s): IRON, TIBC, TRANSFERRIN, FERRITIN in the last 72 hours. Studies/Results: DG Chest Port 1 View  Result Date: 12/28/2019 CLINICAL DATA:  Tracheostomy. EXAM: PORTABLE CHEST 1 VIEW COMPARISON:  12/27/2019.  FINDINGS: Tracheostomy tube, feeding tube, right central line in stable position. Prior CABG. Heart size stable. Diffuse bilateral pulmonary infiltrates/edema again noted with slight improvement aeration from prior exam. Small right pleural effusion. No pneumothorax. Postsurgical changes right shoulder. IMPRESSION: 1.  Lines and tubes stable position. 2.  Prior CABG.  Heart size stable. 3. Diffuse bilateral pulmonary infiltrates/edema again noted with slight improvement in aeration from prior exam. Small right pleural effusion. Electronically Signed   By: Marcello Moores  Register   On: 12/28/2019 06:39   DG CHEST PORT 1 VIEW  Result Date: 12/27/2019 CLINICAL DATA:  Central line placement EXAM: PORTABLE CHEST 1 VIEW COMPARISON:  12/27/2019 FINDINGS: Interval placement of right subclavian dialysis catheter with the tip in the SVC. Tracheostomy tube and feeding tube are unchanged. Cardiomegaly. Bilateral airspace disease again noted, unchanged. IMPRESSION: Right subclavian dialysis catheter placement.  No pneumothorax. Stable diffuse bilateral airspace disease. Electronically Signed   By: Rolm Baptise M.D.   On: 12/27/2019 15:54   DG CHEST PORT 1 VIEW  Result Date: 12/27/2019 CLINICAL DATA:  Dyspnea. EXAM: PORTABLE CHEST 1 VIEW COMPARISON:  12/23/2019 FINDINGS: Tracheostomy tube tip is above the carina. There is a feeding tube with tip well below the level of the GE junction. Previous right shoulder arthroplasty. Bilateral pleural effusions are identified. Diffuse increase interstitial markings are noted bilaterally, unchanged from previous exam. Associated decreased aeration of both lung bases noted. IMPRESSION: 1. Persistent bilateral pleural effusions and interstitial edema compatible with moderate to severe CHF. 2. Stable support apparatus. Electronically Signed   By: Kerby Moors M.D.   On: 12/27/2019 10:08    Medications: Infusions: .  prismasol BGK 4/2.5 500 mL/hr at 12/28/19 0304  .  prismasol BGK 4/2.5  300 mL/hr at 12/28/19 1025  . sodium chloride Stopped (12/25/19 1702)  . feeding supplement (JEVITY 1.2 CAL) 55 mL/hr at 12/28/19 1000  . ferumoxytol 510 mg (12/23/19 0917)  . heparin 10,000 units/ 20 mL infusion syringe 1,050 Units/hr (12/28/19 0704)  . prismasol BGK 4/2.5 1,500 mL/hr at 12/28/19 1036    Scheduled Medications: . sodium chloride   Intravenous Once  . atorvastatin  80 mg Per Tube QHS  . budesonide (PULMICORT) nebulizer solution  0.5 mg Nebulization BID  . chlorhexidine gluconate (MEDLINE KIT)  15 mL Mouth Rinse BID  . Chlorhexidine Gluconate Cloth  6 each Topical Q0600  . feeding supplement (PROSource  TF)  45 mL Per Tube TID  . heparin injection (subcutaneous)  5,000 Units Subcutaneous Q8H  . insulin aspart  0-15 Units Subcutaneous Q4H  . insulin glargine  15 Units Subcutaneous Daily  . ipratropium-albuterol  3 mL Nebulization TID  . mouth rinse  15 mL Mouth Rinse 10 times per day  . melatonin  3 mg Per Tube QHS  . modafinil  100 mg Per Tube QAC breakfast  . multivitamin with minerals  1 tablet Per Tube Daily  . pantoprazole sodium  40 mg Per Tube QHS  . senna-docusate  2 tablet Oral QHS    have reviewed scheduled and prn medications.  Physical Exam: General: Looks more calm and comfortable.  On vent. Heart:RRR, s1s2 nl, no rubs Lungs: Bibasal rhonchi Abdomen:soft, Non-tender, non-distended Extremities: No LE edema. Dialysis Access: Right subclavian nontunneled HD catheter placed by ICU on 8/26.  Jadis Mika Prasad Shawnmichael Parenteau 12/28/2019,11:44 AM  LOS: 24 days  Pager: [phone]

## 2019-12-28 NOTE — Progress Notes (Signed)
  Echocardiogram 2D Echocardiogram has been performed.  Jannett Celestine 12/28/2019, 10:42 AM

## 2019-12-28 NOTE — Progress Notes (Signed)
Subjective: On vent via trach. No recent trach issues.  Objective: Vital signs in last 24 hours: Temp:  [97.1 F (36.2 C)-98.8 F (37.1 C)] 98 F (36.7 C) (08/27 0400) Pulse Rate:  [29-98] 88 (08/27 0900) Resp:  [13-31] 13 (08/27 0900) BP: (113-156)/(57-90) 156/86 (08/27 0900) SpO2:  [90 %-100 %] 100 % (08/27 0900) FiO2 (%):  [40 %] 40 % (08/27 0400)  Physical Exam: Head: NCAT. No lesion. Ears: Examination of the ears shows normal auricles and external auditory canals bilaterally.  Nose: Nasal examination shows normal mucosa, septum, turbinates.  Face: Facial examination shows no asymmetry.  Mouth: Oral cavity examination shows no mucosal abnormalities. Neck:Right neck incision is c/d/i.The trachtube is in place and venting well. Neuro:On vent.  Recent Labs    12/27/19 1705 12/28/19 0424  WBC  --  15.5*  HGB 9.2* 8.0*  HCT 27.0* 25.8*  PLT  --  419*   Recent Labs    12/27/19 1607 12/27/19 1607 12/27/19 1705 12/28/19 0424  NA 139   < > 140 137  K 3.7   < > 3.6 4.1  CL 93*  --   --  97*  CO2 29  --   --  27  GLUCOSE 164*  --   --  150*  BUN 172*  --   --  108*  CREATININE 3.92*  --   --  2.44*  CALCIUM 8.5*  --   --  8.3*   < > = values in this interval not displayed.    Medications:  I have reviewed the patient's current medications. Scheduled: . sodium chloride   Intravenous Once  . atorvastatin  80 mg Per Tube QHS  . bethanechol  10 mg Per Tube TID  . budesonide (PULMICORT) nebulizer solution  0.5 mg Nebulization BID  . chlorhexidine gluconate (MEDLINE KIT)  15 mL Mouth Rinse BID  . Chlorhexidine Gluconate Cloth  6 each Topical Q0600  . feeding supplement (PROSource TF)  45 mL Per Tube TID  . heparin injection (subcutaneous)  5,000 Units Subcutaneous Q8H  . insulin aspart  0-15 Units Subcutaneous Q4H  . insulin glargine  15 Units Subcutaneous Daily  . ipratropium-albuterol  3 mL Nebulization TID  . mouth rinse  15 mL Mouth Rinse 10 times per day   . melatonin  3 mg Per Tube QHS  . modafinil  100 mg Per Tube QAC breakfast  . multivitamin with minerals  1 tablet Per Tube Daily  . pantoprazole sodium  40 mg Per Tube QHS  . senna-docusate  2 tablet Oral QHS   Continuous: .  prismasol BGK 4/2.5 500 mL/hr at 12/28/19 0304  .  prismasol BGK 4/2.5 300 mL/hr at 12/27/19 1616  . sodium chloride Stopped (12/25/19 1702)  . feeding supplement (JEVITY 1.2 CAL) 55 mL/hr at 12/28/19 0900  . ferumoxytol 510 mg (12/23/19 0917)  . heparin 10,000 units/ 20 mL infusion syringe 1,050 Units/hr (12/28/19 0704)  . prismasol BGK 4/2.5 1,500 mL/hr at 12/28/19 8329    Assessment/Plan: POD #6s/p trach. Pt is still ventilator dependent. - Routine fresh trach care. - Wean vent as tolerated. - Will change trach early next week.    LOS: 24 days   Kelly Williams W Kelly Williams 12/28/2019, 9:17 AM

## 2019-12-29 ENCOUNTER — Inpatient Hospital Stay (HOSPITAL_COMMUNITY): Payer: Medicare HMO

## 2019-12-29 LAB — RENAL FUNCTION PANEL
Albumin: 2.8 g/dL — ABNORMAL LOW (ref 3.5–5.0)
Albumin: 2.8 g/dL — ABNORMAL LOW (ref 3.5–5.0)
Anion gap: 10 (ref 5–15)
Anion gap: 12 (ref 5–15)
BUN: 49 mg/dL — ABNORMAL HIGH (ref 8–23)
BUN: 36 mg/dL — ABNORMAL HIGH (ref 8–23)
CO2: 26 mmol/L (ref 22–32)
CO2: 24 mmol/L (ref 22–32)
Calcium: 8.4 mg/dL — ABNORMAL LOW (ref 8.9–10.3)
Calcium: 8.4 mg/dL — ABNORMAL LOW (ref 8.9–10.3)
Chloride: 99 mmol/L (ref 98–111)
Chloride: 99 mmol/L (ref 98–111)
Creatinine, Ser: 1.39 mg/dL — ABNORMAL HIGH (ref 0.61–1.24)
Creatinine, Ser: 1.21 mg/dL (ref 0.61–1.24)
GFR calc Af Amer: 55 mL/min — ABNORMAL LOW (ref >60)
GFR calc Af Amer: >60 mL/min (ref >60)
GFR calc non Af Amer: 48 mL/min — ABNORMAL LOW (ref >60)
GFR calc non Af Amer: 57 mL/min — ABNORMAL LOW (ref >60)
Glucose, Bld: 219 mg/dL — ABNORMAL HIGH (ref 70–99)
Glucose, Bld: 348 mg/dL — ABNORMAL HIGH (ref 70–99)
Phosphorus: 2.3 mg/dL — ABNORMAL LOW (ref 2.5–4.6)
Phosphorus: 2.9 mg/dL (ref 2.5–4.6)
Potassium: 4.5 mmol/L (ref 3.5–5.1)
Potassium: 5.1 mmol/L (ref 3.5–5.1)
Sodium: 135 mmol/L (ref 135–145)
Sodium: 135 mmol/L (ref 135–145)

## 2019-12-29 LAB — APTT: aPTT: >200 seconds (ref 24–36)

## 2019-12-29 LAB — GLUCOSE, CAPILLARY
Glucose-Capillary: 176 mg/dL — ABNORMAL HIGH (ref 70–99)
Glucose-Capillary: 172 mg/dL — ABNORMAL HIGH (ref 70–99)
Glucose-Capillary: 157 mg/dL — ABNORMAL HIGH (ref 70–99)
Glucose-Capillary: 302 mg/dL — ABNORMAL HIGH (ref 70–99)
Glucose-Capillary: 172 mg/dL — ABNORMAL HIGH (ref 70–99)
Glucose-Capillary: 74 mg/dL (ref 70–99)

## 2019-12-29 LAB — MAGNESIUM: Magnesium: 2.7 mg/dL — ABNORMAL HIGH (ref 1.7–2.4)

## 2019-12-29 LAB — PHOSPHORUS: Phosphorus: 2.3 mg/dL — ABNORMAL LOW (ref 2.5–4.6)

## 2019-12-29 MED ORDER — HALOPERIDOL LACTATE 5 MG/ML IJ SOLN
INTRAMUSCULAR | Status: AC
  Administered 2019-12-29: 2 mg via INTRAVENOUS
  Filled 2019-12-29: qty 1

## 2019-12-29 MED ORDER — SODIUM PHOSPHATES 45 MMOLE/15ML IV SOLN
20.0000 mmol | Freq: Once | INTRAVENOUS | Status: AC
  Administered 2019-12-29: 20 mmol via INTRAVENOUS
  Filled 2019-12-29: qty 6.67

## 2019-12-29 MED ORDER — APIXABAN 5 MG PO TABS
5.0000 mg | ORAL_TABLET | Freq: Two times a day (BID) | ORAL | Status: DC
  Administered 2019-12-29 – 2020-01-04 (×13): 5 mg
  Filled 2019-12-29 (×13): qty 1

## 2019-12-29 MED ORDER — DEXMEDETOMIDINE HCL IN NACL 400 MCG/100ML IV SOLN
0.4000 ug/kg/h | INTRAVENOUS | Status: DC
  Administered 2019-12-29 – 2019-12-31 (×3): 0.4 ug/kg/h via INTRAVENOUS
  Administered 2020-01-01: 0.6 ug/kg/h via INTRAVENOUS
  Filled 2019-12-29 (×6): qty 100

## 2019-12-29 MED ORDER — APIXABAN 5 MG PO TABS: 5.0000 mg | ORAL_TABLET | Freq: Two times a day (BID) | ORAL | Status: DC

## 2019-12-29 MED ORDER — QUETIAPINE FUMARATE 25 MG PO TABS
25.0000 mg | ORAL_TABLET | Freq: Every day | ORAL | Status: DC
  Administered 2019-12-29 – 2019-12-30 (×2): 25 mg via ORAL
  Filled 2019-12-29 (×3): qty 1

## 2019-12-29 MED ORDER — HALOPERIDOL LACTATE 5 MG/ML IJ SOLN: 2.0000 mg | Freq: Four times a day (QID) | INTRAMUSCULAR | Status: DC | PRN

## 2019-12-29 NOTE — Progress Notes (Signed)
  Progress Note    12/29/2019 12:01 PM 7 Days Post-Op  Subjective:  On vent with CRRT  Vitals:   12/29/19 1100 12/29/19 1145  BP: 125/74 125/74  Pulse: 86 89  Resp: 19 (!) 23  Temp:    SpO2: 96% 100%    Physical Exam: Awake, minimally interactive Moves all extremities to command Right neck incision healing well  CBC    Component Value Date/Time   WBC 15.5 (H) 12/28/2019 0424   RBC 2.70 (L) 12/28/2019 0424   HGB 8.0 (L) 12/28/2019 0424   HCT 25.8 (L) 12/28/2019 0424   PLT 419 (H) 12/28/2019 0424   MCV 95.6 12/28/2019 0424   MCH 29.6 12/28/2019 0424   MCHC 31.0 12/28/2019 0424   RDW 15.7 (H) 12/28/2019 0424   LYMPHSABS 0.8 12/28/2019 0424   MONOABS 1.2 (H) 12/28/2019 0424   EOSABS 0.2 12/28/2019 0424   BASOSABS 0.1 12/28/2019 0424    BMET    Component Value Date/Time   NA 135 12/29/2019 0348   K 4.5 12/29/2019 0348   CL 99 12/29/2019 0348   CO2 26 12/29/2019 0348   GLUCOSE 219 (H) 12/29/2019 0348   BUN 49 (H) 12/29/2019 0348   CREATININE 1.39 (H) 12/29/2019 0348   CREATININE 1.34 (H) 08/24/2019 1114   CALCIUM 8.4 (L) 12/29/2019 0348   GFRNONAA 48 (L) 12/29/2019 0348   GFRNONAA 50 (L) 08/24/2019 1114   GFRAA 55 (L) 12/29/2019 0348   GFRAA 58 (L) 08/24/2019 1114    INR    Component Value Date/Time   INR 1.2 12/27/2019 0453     Intake/Output Summary (Last 24 hours) at 12/29/2019 1201 Last data filed at 12/29/2019 1100 Gross per 24 hour  Intake 1664 ml  Output 4898 ml  Net -3234 ml     Assessment/plan:  79 y.o. male is s/p R CEA with postop respiratory failure and aki requiring dialysis. Appreciate consulting services care of this patient.    Gaudencio Chesnut C. Donzetta Matters, MD [facility] Office: [phone] Pager: [phone]  12/29/2019 12:01 PM

## 2019-12-29 NOTE — Progress Notes (Signed)
Cardiology Progress Note  Patient ID: Kelly Williams MRN: [id number] DOB: 11/26/1940 Date of Encounter: 12/29/2019  Primary Cardiologist: Bridgette Christopher, MD  Subjective   Chief Complaint: None.   HPI: On CRRT. Good volume removal.   ROS:  All other ROS reviewed and negative. Pertinent positives noted in the HPI.     Inpatient Medications  Scheduled Meds: . sodium chloride   Intravenous Once  . atorvastatin  80 mg Per Tube QHS  . budesonide (PULMICORT) nebulizer solution  0.5 mg Nebulization BID  . chlorhexidine gluconate (MEDLINE KIT)  15 mL Mouth Rinse BID  . Chlorhexidine Gluconate Cloth  6 each Topical Q0600  . feeding supplement (PROSource TF)  45 mL Per Tube TID  . heparin injection (subcutaneous)  5,000 Units Subcutaneous Q8H  . insulin aspart  0-15 Units Subcutaneous Q4H  . insulin glargine  15 Units Subcutaneous Daily  . ipratropium-albuterol  3 mL Nebulization TID  . mouth rinse  15 mL Mouth Rinse 10 times per day  . melatonin  3 mg Per Tube QHS  . multivitamin with minerals  1 tablet Per Tube Daily  . pantoprazole sodium  40 mg Per Tube QHS  . senna-docusate  2 tablet Oral QHS   Continuous Infusions: .  prismasol BGK 4/2.5 500 mL/hr at 12/29/19 1029  .  prismasol BGK 4/2.5 300 mL/hr at 12/29/19 0323  . sodium chloride Stopped (12/25/19 1702)  . feeding supplement (JEVITY 1.2 CAL) 55 mL/hr at 12/29/19 1000  . ferumoxytol 510 mg (12/23/19 0917)  . heparin 10,000 units/ 20 mL infusion syringe 800 Units/hr (12/29/19 0700)  . prismasol BGK 4/2.5 1,500 mL/hr at 12/29/19 0632   PRN Meds: sodium chloride, acetaminophen **OR** acetaminophen, fentaNYL (SUBLIMAZE) injection, fentaNYL (SUBLIMAZE) injection, heparin, heparin, hydrALAZINE, ipratropium-albuterol, metoprolol tartrate, ondansetron (ZOFRAN) IV, phenol, polyethylene glycol, potassium chloride   Vital Signs   Vitals:   12/29/19 0801 12/29/19 0803 12/29/19 0900 12/29/19 1000  BP:  (!) 130/105 (!) 131/93  121/80  Pulse:  85 96 89  Resp:  (!) 30 20 16  Temp:      TempSrc:      SpO2: 100% 100% 90% 99%  Weight:      Height:        Intake/Output Summary (Last 24 hours) at 12/29/2019 1105 Last data filed at 12/29/2019 1000 Gross per 24 hour  Intake 1664 ml  Output 4922 ml  Net -3258 ml   Last 3 Weights 12/25/2019 12/10/2019 12/21/2019  Weight (lbs) 169 lb 12.1 oz 169 lb 12.1 oz 174 lb 9.7 oz  Weight (kg) 77 kg 77 kg 79.2 kg      Telemetry  Overnight telemetry shows afib 70s, which I personally reviewed.   ECG  The most recent ECG shows Afib 101 bpm, which I personally reviewed.   Physical Exam   Vitals:   12/29/19 0801 12/29/19 0803 12/29/19 0900 12/29/19 1000  BP:  (!) 130/105 (!) 131/93 121/80  Pulse:  85 96 89  Resp:  (!) 30 20 16  Temp:      TempSrc:      SpO2: 100% 100% 90% 99%  Weight:      Height:         Intake/Output Summary (Last 24 hours) at 12/29/2019 1105 Last data filed at 12/29/2019 1000 Gross per 24 hour  Intake 1664 ml  Output 4922 ml  Net -3258 ml    Last 3 Weights 12/25/2019 12/28/2019 12/21/2019  Weight (lbs) 169 lb 12.1 oz 169 lb   12.1 oz 174 lb 9.7 oz  Weight (kg) 77 kg 77 kg 79.2 kg    Body mass index is 27.4 kg/m.  General: Well nourished, well developed, in no acute distress Head: Atraumatic, normal size  Eyes: PEERLA, EOMI  Neck: Supple, no JVD, trach site clean and dry Endocrine: No thryomegaly Cardiac: Normal S1, S2; irregular rhythm, no m/r/g Lungs: diminished breath sounds bilaterally  Abd: Soft, nontender, no hepatomegaly  Ext: No edema, pulses 2+ Musculoskeletal: No deformities, BUE and BLE strength normal and equal Skin: Warm and dry, no rashes   Neuro: alert, awake, follows commands   Labs  High Sensitivity Troponin:  No results for input(s): TROPONINIHS in the last 720 hours.   Cardiac EnzymesNo results for input(s): TROPONINI in the last 168 hours. No results for input(s): TROPIPOC in the last 168 hours.  Chemistry Recent Labs    Lab 12/28/19 0424 12/28/19 1609 12/29/19 0348  NA 137 138 135  K 4.1 3.9 4.5  CL 97* 99 99  CO2 _0 GLUCOSE 150* 84 219*  BUN 108* 69* 49*  CREATININE 2.44* 1.66* 1.39*  CALCIUM 8.3* 8.5* 8.4*  ALBUMIN 2.9* 3.0* 2.8*  GFRNONAA 24* 39* 48*  GFRAA 28* 45* 55*  ANIONGAP _1 Hematology Recent Labs  Lab 12/27/19 1705 12/28/19 0424  WBC  --  15.5*  RBC  --  2.70*  HGB 9.2* 8.0*  HCT 27.0* 25.8*  MCV  --  95.6  MCH  --  29.6  MCHC  --  31.0  RDW  --  15.7*  PLT  --  419*   BNPNo results for input(s): BNP, PROBNP in the last 168 hours.  DDimer No results for input(s): DDIMER in the last 168 hours.   Radiology  DG Chest Port 1 View  Result Date: 12/28/2019 CLINICAL DATA:  Tracheostomy. EXAM: PORTABLE CHEST 1 VIEW COMPARISON:  12/27/2019. FINDINGS: Tracheostomy tube, feeding tube, right central line in stable position. Prior CABG. Heart size stable. Diffuse bilateral pulmonary infiltrates/edema again noted with slight improvement aeration from prior exam. Small right pleural effusion. No pneumothorax. Postsurgical changes right shoulder. IMPRESSION: 1.  Lines and tubes stable position. 2.  Prior CABG.  Heart size stable. 3. Diffuse bilateral pulmonary infiltrates/edema again noted with slight improvement in aeration from prior exam. Small right pleural effusion. Electronically Signed   By: Marcello Moores  Register   On: 12/28/2019 06:39   DG CHEST PORT 1 VIEW  Result Date: 12/27/2019 CLINICAL DATA:  Central line placement EXAM: PORTABLE CHEST 1 VIEW COMPARISON:  12/27/2019 FINDINGS: Interval placement of right subclavian dialysis catheter with the tip in the SVC. Tracheostomy tube and feeding tube are unchanged. Cardiomegaly. Bilateral airspace disease again noted, unchanged. IMPRESSION: Right subclavian dialysis catheter placement.  No pneumothorax. Stable diffuse bilateral airspace disease. Electronically Signed   By: Rolm Baptise M.D.   On: 12/27/2019 15:54    ECHOCARDIOGRAM COMPLETE  Result Date: 12/28/2019    ECHOCARDIOGRAM REPORT   Patient Name:   Kelly Williams Date of Exam: 12/28/2019 Medical Rec #:  [id number]      Height:       66.0 in Accession #:    [id number]     Weight:       169.8 lb Date of Birth:  04-20-1941      BSA:          1.865 m Patient Age:    12 years       BP:  128/68 mmHg Patient Gender: M              HR:           88 bpm. Exam Location:  Inpatient Procedure: 2D Echo Indications:    volume overload  History:        Patient has prior history of Echocardiogram examinations, most                 recent 11/27/2019. CAD, Prior CABG, COPD and Stroke; Risk                 Factors:Diabetes, Hypertension, Dyslipidemia and Former Smoker.                 ETOH Abuse.  Sonographer:    Vijay Shankar RDCS (AE) Referring Phys: [id number] BRIDGETTE CHRISTOPHER  Sonographer Comments: Suboptimal apical window and echo performed with patient supine and on artificial respirator. Image acquisition challenging due to respiratory motion. patient had difficulty remaining still at times, which made imaging challenging. no apical window available in a more lateral position. apical window were consequently off axis IMPRESSIONS  1. Left ventricular ejection fraction, by estimation, is 50 to 55%. The left ventricle has low normal function. The left ventricle has no regional wall motion abnormalities. Left ventricular diastolic parameters are indeterminate. There is the interventricular septum is flattened in systole and diastole, consistent with right ventricular pressure and volume overload.  2. Right ventricular systolic function is normal. The right ventricular size is normal.  3. The mitral valve is normal in structure. Mild to moderate mitral valve regurgitation. No evidence of mitral stenosis.  4. The aortic valve is tricuspid. Aortic valve regurgitation is not visualized. Mild to moderate aortic valve sclerosis/calcification is present, without any evidence of  aortic stenosis.  5. The inferior vena cava is normal in size with greater than 50% respiratory variability, suggesting right atrial pressure of 3 mmHg. Comparison(s): EF is mildly improved from prior echocardiogram. FINDINGS  Left Ventricle: Left ventricular ejection fraction, by estimation, is 50 to 55%. The left ventricle has low normal function. The left ventricle has no regional wall motion abnormalities. The left ventricular internal cavity size was normal in size. There is no left ventricular hypertrophy. The interventricular septum is flattened in systole and diastole, consistent with right ventricular pressure and volume overload. Left ventricular diastolic parameters are indeterminate. Right Ventricle: The right ventricular size is normal. No increase in right ventricular wall thickness. Right ventricular systolic function is normal. Left Atrium: Left atrial size was normal in size. Right Atrium: Right atrial size was normal in size. Pericardium: There is no evidence of pericardial effusion. Mitral Valve: The mitral valve is normal in structure. There is mild thickening of the mitral valve leaflet(s). Normal mobility of the mitral valve leaflets. Mild to moderate mitral valve regurgitation. No evidence of mitral valve stenosis. Tricuspid Valve: The tricuspid valve is normal in structure. Tricuspid valve regurgitation is not demonstrated. No evidence of tricuspid stenosis. Aortic Valve: The aortic valve is tricuspid. Aortic valve regurgitation is not visualized. Mild to moderate aortic valve sclerosis/calcification is present, without any evidence of aortic stenosis. Pulmonic Valve: The pulmonic valve was normal in structure. Pulmonic valve regurgitation is not visualized. No evidence of pulmonic stenosis. Aorta: The aortic root is normal in size and structure. Venous: The inferior vena cava is normal in size with greater than 50% respiratory variability, suggesting right atrial pressure of 3 mmHg.  IAS/Shunts: No atrial level shunt detected by color flow Doppler.    LEFT VENTRICLE PLAX 2D LVIDd:         4.80 cm LVIDs:         3.50 cm LV PW:         1.10 cm LV IVS:        1.10 cm LVOT diam:     2.10 cm LV SV:         48 LV SV Index:   26 LVOT Area:     3.46 cm  LEFT ATRIUM           Index LA diam:      4.30 cm 2.31 cm/m LA Vol (A4C): 24.9 ml 13.35 ml/m  AORTIC VALVE LVOT Vmax:   64.60 cm/s LVOT Vmean:  49.800 cm/s LVOT VTI:    0.138 m  AORTA Ao Root diam: 3.30 cm  SHUNTS Systemic VTI:  0.14 m Systemic Diam: 2.10 cm Mark Skains MD Electronically signed by Mark Skains MD Signature Date/Time: 12/28/2019/11:55:30 AM    Final     Cardiac Studies  TTE 12/28/2019  1. Left ventricular ejection fraction, by estimation, is 50 to 55%. The  left ventricle has low normal function. The left ventricle has no regional  wall motion abnormalities. Left ventricular diastolic parameters are  indeterminate. There is the  interventricular septum is flattened in systole and diastole, consistent  with right ventricular pressure and volume overload.  2. Right ventricular systolic function is normal. The right ventricular  size is normal.  3. The mitral valve is normal in structure. Mild to moderate mitral valve  regurgitation. No evidence of mitral stenosis.  4. The aortic valve is tricuspid. Aortic valve regurgitation is not  visualized. Mild to moderate aortic valve sclerosis/calcification is  present, without any evidence of aortic stenosis.  5. The inferior vena cava is normal in size with greater than 50%  respiratory variability, suggesting right atrial pressure of 3 mmHg.  Patient Profile  Kelly Williams is a 79 y.o. male with CAD, PAD, DM, HTN, HLD, CVA who was admitted on 12/24/2019 for R CEA. Course complicated by post op respiratory failure, volume overload/AKI on CRRT, postop afib.   Assessment & Plan   1. R CEA with postop respiratory failure -now with trach. Had several  re-intubations -restart plavix per vascular  -statin  2. AKI/Volume overload -volume overload related to renal decline. EF stable. Continue CRRT.   3. Atrial fibrillation -rate controlled without meds. IV metoprolol as needed. Transition to metoprolol succinate 25 daily when able.  -discussed starting eliquis per CCM -given entire course, would recommend rate control strategy   For questions or updates, please contact [facility] Please consult [email or web address] for contact info under   Time Spent with Patient: I have spent a total of 15 minutes with patient reviewing hospital notes, telemetry, EKGs, labs and examining the patient as well as establishing an assessment and plan that was discussed with the patient.  > 50% of time was spent in direct patient care.    Signed, Pajaro Dunes T. O'Neal, MD [place]  [facility]  12/29/2019 11:05 AM   

## 2019-12-29 NOTE — Progress Notes (Signed)
RT NOTE: Sutures removed per CCM as pt is 7 days post trach procedure. Trach care performed, IC changed, trach ties changed and secured. Vitals are stable. RT will continue to monitor.

## 2019-12-29 NOTE — Progress Notes (Signed)
[facility] NEPHROLOGY PROGRESS NOTE  Assessment/ Plan: Pt is a 79 y.o. yo male  medical history significant for alcohol use disorder, PAD, COPD CAD, DM 2, HLD, HTN, CVAwho present w/endarterectomy revision complicated by ventilatory dependence and AKI  # Severe nonoliguric AKI on CKD 3:  Likely due to cardiorenal syndrome.  Renal ultrasound without obstruction.  Complicated hospital course with fluctuation in serum creatinine level.  Started CRRT on 8/26 for significant volume overload and uremia.   Hopefully, his kidney function eventually improve. If he remains on vent and became dialysis dependent then it will be difficult disposition.  I have discussed with his daughter at length on 8/26.  Palliative care is following.  He has been tolerating CRRT well,.  Mental status and breathing is much better.  UF around 150 cc an hour, tolerating well. Use 4K bath, IV heparin for systemic anticoagulation.  #Anticoagulation: High PTT however ACT within range.  Discontinue PTT.  Check ACT level.  Discussed with the nurse.  #HTN/Volume Status: Blood pressure acceptable.  UF with CRRT.  #Acute hypoxic respiratory failure/ventilatory dependence: Management per primary team.  He has tracheostomy and on vent.  #Acute pulmonary edema/CHF: Ultrafiltration with dialysis as discussed above.[name]  #Anemia multifactorial including kidney disease, chronic illness: Continue to monitor.  Ferritin 325.  Iron saturation of 12.  Feraheme x2 doses.  Monitor hemoglobin.  Received Aranesp on 8/27.  # Hyponatremia hypervolemic: Managing with dialysis.  #Hyperkalemia: Resolved.    Subjective: Seen and examined.  Tolerating CRRT well with around UF 150 cc an hour.  He looks more alert awake and comfortable today.  Remains on trach and vent. Objective Vital signs in last 24 hours: Vitals:   12/29/19 0801 12/29/19 0803 12/29/19 0900 12/29/19 1000  BP:  (!) 130/105 (!) 131/93 121/80  Pulse:  85 96  89  Resp:  (!) _0 Temp:      TempSrc:      SpO2: 100% 100% 90% 99%  Weight:      Height:       Weight change:   Intake/Output Summary (Last 24 hours) at 12/29/2019 1116 Last data filed at 12/29/2019 1000 Gross per 24 hour  Intake 1664 ml  Output 4922 ml  Net -3258 ml       Labs: Basic Metabolic Panel: Recent Labs  Lab 12/28/19 0424 12/28/19 1609 12/29/19 0348  NA 137 138 135  K 4.1 3.9 4.5  CL 97* 99 99  CO2 _1 GLUCOSE 150* 84 219*  BUN 108* 69* 49*  CREATININE 2.44* 1.66* 1.39*  CALCIUM 8.3* 8.5* 8.4*  PHOS 4.5  4.7* 3.3 2.3*  2.3*   Liver Function Tests: Recent Labs  Lab 12/28/19 0424 12/28/19 1609 12/29/19 0348  ALBUMIN 2.9* 3.0* 2.8*   No results for input(s): LIPASE, AMYLASE in the last 168 hours. No results for input(s): AMMONIA in the last 168 hours. CBC: Recent Labs  Lab 12/27/19 1705 12/28/19 0424  WBC  --  15.5*  NEUTROABS  --  13.2*  HGB 9.2* 8.0*  HCT 27.0* 25.8*  MCV  --  95.6  PLT  --  419*   Cardiac Enzymes: No results for input(s): CKTOTAL, CKMB, CKMBINDEX, TROPONINI in the last 168 hours. CBG: Recent Labs  Lab 12/28/19 1554 12/28/19 1926 12/28/19 2307 12/29/19 0312 12/29/19 0748  GLUCAP 89 81 105* 176* 172*    Iron Studies:  No results for input(s): IRON, TIBC, TRANSFERRIN, FERRITIN in the last 72 hours.  Studies/Results: DG Chest Port 1 View  Result Date: 12/28/2019 CLINICAL DATA:  Tracheostomy. EXAM: PORTABLE CHEST 1 VIEW COMPARISON:  12/27/2019. FINDINGS: Tracheostomy tube, feeding tube, right central line in stable position. Prior CABG. Heart size stable. Diffuse bilateral pulmonary infiltrates/edema again noted with slight improvement aeration from prior exam. Small right pleural effusion. No pneumothorax. Postsurgical changes right shoulder. IMPRESSION: 1.  Lines and tubes stable position. 2.  Prior CABG.  Heart size stable. 3. Diffuse bilateral pulmonary infiltrates/edema again noted with slight  improvement in aeration from prior exam. Small right pleural effusion. Electronically Signed   By: Marcello Moores  Register   On: 12/28/2019 06:39   DG CHEST PORT 1 VIEW  Result Date: 12/27/2019 CLINICAL DATA:  Central line placement EXAM: PORTABLE CHEST 1 VIEW COMPARISON:  12/27/2019 FINDINGS: Interval placement of right subclavian dialysis catheter with the tip in the SVC. Tracheostomy tube and feeding tube are unchanged. Cardiomegaly. Bilateral airspace disease again noted, unchanged. IMPRESSION: Right subclavian dialysis catheter placement.  No pneumothorax. Stable diffuse bilateral airspace disease. Electronically Signed   By: Rolm Baptise M.D.   On: 12/27/2019 15:54   ECHOCARDIOGRAM COMPLETE  Result Date: 12/28/2019    ECHOCARDIOGRAM REPORT   Patient Name:   RAEQWON LUX Date of Exam: 12/28/2019 Medical Rec #:  [id number]      Height:       66.0 in Accession #:    [id number]     Weight:       169.8 lb Date of Birth:  09-25-1940      BSA:          1.865 m Patient Age:    65 years       BP:           128/68 mmHg Patient Gender: M              HR:           88 bpm. Exam Location:  Inpatient Procedure: 2D Echo Indications:    volume overload  History:        Patient has prior history of Echocardiogram examinations, most                 recent 11/27/2019. CAD, Prior CABG, COPD and Stroke; Risk                 Factors:Diabetes, Hypertension, Dyslipidemia and Former Smoker.                 ETOH Abuse.  Sonographer:    Jannett Celestine RDCS (AE) Referring Phys: [phone] [facility]  Sonographer Comments: Suboptimal apical window and echo performed with patient supine and on artificial respirator. Image acquisition challenging due to respiratory motion. patient had difficulty remaining still at times, which made imaging challenging. no apical window available in a more lateral position. apical window were consequently off axis IMPRESSIONS  1. Left ventricular ejection fraction, by estimation, is 50 to 55%. The  left ventricle has low normal function. The left ventricle has no regional wall motion abnormalities. Left ventricular diastolic parameters are indeterminate. There is the interventricular septum is flattened in systole and diastole, consistent with right ventricular pressure and volume overload.  2. Right ventricular systolic function is normal. The right ventricular size is normal.  3. The mitral valve is normal in structure. Mild to moderate mitral valve regurgitation. No evidence of mitral stenosis.  4. The aortic valve is tricuspid. Aortic valve regurgitation is not visualized. Mild to moderate aortic valve sclerosis/calcification is present,  without any evidence of aortic stenosis.  5. The inferior vena cava is normal in size with greater than 50% respiratory variability, suggesting right atrial pressure of 3 mmHg. Comparison(s): EF is mildly improved from prior echocardiogram. FINDINGS  Left Ventricle: Left ventricular ejection fraction, by estimation, is 50 to 55%. The left ventricle has low normal function. The left ventricle has no regional wall motion abnormalities. The left ventricular internal cavity size was normal in size. There is no left ventricular hypertrophy. The interventricular septum is flattened in systole and diastole, consistent with right ventricular pressure and volume overload. Left ventricular diastolic parameters are indeterminate. Right Ventricle: The right ventricular size is normal. No increase in right ventricular wall thickness. Right ventricular systolic function is normal. Left Atrium: Left atrial size was normal in size. Right Atrium: Right atrial size was normal in size. Pericardium: There is no evidence of pericardial effusion. Mitral Valve: The mitral valve is normal in structure. There is mild thickening of the mitral valve leaflet(s). Normal mobility of the mitral valve leaflets. Mild to moderate mitral valve regurgitation. No evidence of mitral valve stenosis. Tricuspid  Valve: The tricuspid valve is normal in structure. Tricuspid valve regurgitation is not demonstrated. No evidence of tricuspid stenosis. Aortic Valve: The aortic valve is tricuspid. Aortic valve regurgitation is not visualized. Mild to moderate aortic valve sclerosis/calcification is present, without any evidence of aortic stenosis. Pulmonic Valve: The pulmonic valve was normal in structure. Pulmonic valve regurgitation is not visualized. No evidence of pulmonic stenosis. Aorta: The aortic root is normal in size and structure. Venous: The inferior vena cava is normal in size with greater than 50% respiratory variability, suggesting right atrial pressure of 3 mmHg. IAS/Shunts: No atrial level shunt detected by color flow Doppler.  LEFT VENTRICLE PLAX 2D LVIDd:         4.80 cm LVIDs:         3.50 cm LV PW:         1.10 cm LV IVS:        1.10 cm LVOT diam:     2.10 cm LV SV:         48 LV SV Index:   26 LVOT Area:     3.46 cm  LEFT ATRIUM           Index LA diam:      4.30 cm 2.31 cm/m LA Vol (A4C): 24.9 ml 13.35 ml/m  AORTIC VALVE LVOT Vmax:   64.60 cm/s LVOT Vmean:  49.800 cm/s LVOT VTI:    0.138 m  AORTA Ao Root diam: 3.30 cm  SHUNTS Systemic VTI:  0.14 m Systemic Diam: 2.10 cm Candee Furbish MD Electronically signed by Candee Furbish MD Signature Date/Time: 12/28/2019/11:55:30 AM    Final     Medications: Infusions: .  prismasol BGK 4/2.5 500 mL/hr at 12/29/19 1029  .  prismasol BGK 4/2.5 300 mL/hr at 12/29/19 0323  . sodium chloride Stopped (12/25/19 1702)  . feeding supplement (JEVITY 1.2 CAL) 55 mL/hr at 12/29/19 1000  . ferumoxytol 510 mg (12/23/19 0917)  . heparin 10,000 units/ 20 mL infusion syringe 800 Units/hr (12/29/19 0700)  . prismasol BGK 4/2.5 1,500 mL/hr at 12/29/19 7680  . sodium phosphate  Dextrose 5% IVPB      Scheduled Medications: . sodium chloride   Intravenous Once  . apixaban  5 mg Per Tube BID  . atorvastatin  80 mg Per Tube QHS  . budesonide (PULMICORT) nebulizer solution  0.5  mg Nebulization BID  .  chlorhexidine gluconate (MEDLINE KIT)  15 mL Mouth Rinse BID  . Chlorhexidine Gluconate Cloth  6 each Topical Q0600  . feeding supplement (PROSource TF)  45 mL Per Tube TID  . insulin aspart  0-15 Units Subcutaneous Q4H  . insulin glargine  15 Units Subcutaneous Daily  . ipratropium-albuterol  3 mL Nebulization TID  . mouth rinse  15 mL Mouth Rinse 10 times per day  . melatonin  3 mg Per Tube QHS  . multivitamin with minerals  1 tablet Per Tube Daily  . pantoprazole sodium  40 mg Per Tube QHS  . senna-docusate  2 tablet Oral QHS    have reviewed scheduled and prn medications.  Physical Exam: General: Remains at bed however looks more alert and comfortable. Heart:RRR, s1s2 nl, no rubs Lungs: Bibasal rhonchi Abdomen:soft, Non-tender, non-distended Extremities: No LE edema. Dialysis Access: Right subclavian nontunneled HD catheter placed by ICU on 8/26.  Noah Lembke Prasad Lisett Dirusso 12/29/2019,11:16 AM  LOS: 25 days  Pager: [phone]

## 2019-12-29 NOTE — Progress Notes (Signed)
eLink Physician-Brief Progress Note Patient Name: Kelly Williams DOB: 1940-07-06 MRN: [id number]   Date of Service  12/29/2019  HPI/Events of Note  Patient is a bit restless, vital signs, including oxygen saturation, are within normal limits. BP 128/62, MAP 82, he has Precedex on the [facility] but it has not been started yet. Bedside RN asked to auscultate his chest and it was clear.  eICU Interventions  Bedside RN asked to start Precedex infusion to improve patient comfort on the ventilator.        Frederik Pear 12/29/2019, 7:47 PM

## 2019-12-29 NOTE — Discharge Instructions (Signed)

## 2019-12-29 NOTE — Progress Notes (Signed)
ANTICOAGULATION CONSULT NOTE - Initial Consult  Pharmacy Consult:  Eliquis Indication: atrial fibrillation  Allergies  Allergen Reactions  . Metformin And Related Diarrhea    Patient Measurements: Height: 5\' 6"  (167.6 cm) Weight: 77 kg (169 lb 12.1 oz) IBW/kg (Calculated) : 63.8  Vital Signs: Temp: 97.4 F (36.3 C) (08/28 0800) Temp Source: Axillary (08/28 0800) BP: 121/80 (08/28 1000) Pulse Rate: 89 (08/28 1000)  Labs: Recent Labs    12/27/19 1607 12/27/19 1705 12/28/19 0424 12/28/19 1609 12/29/19 0348  HGB  --  9.2* 8.0*  --   --   HCT  --  27.0* 25.8*  --   --   PLT  --   --  419*  --   --   APTT  --   --  >200*  --  >200*  CREATININE   < >  --  2.44* 1.66* 1.39*   < > = values in this interval not displayed.    Estimated Creatinine Clearance: 42.1 mL/min (A) (by C-G formula based on SCr of 1.39 mg/dL (H)).   Medical History: Past Medical History:  Diagnosis Date  . Alcohol abuse   . Alcohol abuse   . Arthritis   . Carotid arterial disease ([place])   . Chronic kidney disease    CKD-patient denies  . COPD (chronic obstructive pulmonary disease) (HCC)    uses inhalers  . Coronary artery disease   . Diabetes mellitus without complication ([place])   . Diabetic retinopathy ([place])   . Hyperlipidemia   . Hypertension   . Normocytic anemia 01/16/2019  . Stroke ([place])    around 2020, 2 after bypass in 2015    Assessment: 6 YOM with history of CVA and new AFib (CHADSVASC = 7) to start Eliquis.  Currently on CRRT, but not a contraindication to therapy.  He was oozing blood from trach a couple of days ago, but no further bleeding.  Age < 71, weight > 60 kg.  Goal of Therapy:  Appropriate anticoagulation Monitor platelets by anticoagulation protocol: Yes   Plan:   D/C heparin SQ Eliquis 5mg  PO BID Pharmacy will sign off and follow peripherally.  Thank you for the consult!  Lenorris Karger D. Mina Marble, PharmD, BCPS, [place] 12/29/2019, 11:08 AM

## 2019-12-29 NOTE — Progress Notes (Signed)
NAME:  Kelly Williams, MRN:  [id number], DOB:  10/18/1940, LOS: 50 ADMISSION DATE:  12/30/2019, CONSULTATION DATE:  12/12/2019 REFERRING MD:  Oneida Alar, CHIEF COMPLAINT:  Vent management for acute respiratory failure   Brief History   80 year old male with history of recurrent R ICA occlusion post-CEA who developed respiratory failure requiring reintubation and pneumonia. He initially failed self extubation 8/5 after 24 hours off vent, and was again extubated 8/7 with seemingly good toleration. Then in the early am hours of 8/11, several hours after self-removal of Cortrak, he became unresponsive and hypoxemic requiring reintubation and transfer to ICU.  Patient is awaiting trach placement.  Past Medical History   Past Medical History:  Diagnosis Date  . Alcohol abuse   . Alcohol abuse   . Arthritis   . Carotid arterial disease ([place])   . Chronic kidney disease    CKD-patient denies  . COPD (chronic obstructive pulmonary disease) (HCC)    uses inhalers  . Coronary artery disease   . Diabetes mellitus without complication ([place])   . Diabetic retinopathy ([place])   . Hyperlipidemia   . Hypertension   . Normocytic anemia 01/16/2019  . Stroke [facility])    around 2020, 2 after bypass in 2015   Significant Hospital Events   8/3: admitted to [facility], CEA, PCCM consulted for vent management 8/4: self extubated 8/5: Re-intubated overnight 8/7: Extubated 8/11: Reintubation   Consults:  Neurology   Procedures:  8/3: ETT out 821 12/17/2019 trach per ENT 8/3: R CEA 8/3: Exploration of R CEA and R ICA thrombectomy 8/3: R art line out 12/27/2019 right subclavian hemodialysis catheter Significant Diagnostic Tests:  Carotid duplex 8/3 > right ICA occlusion CTA Head/Neck 8/3 > No acute intracranial abnormality. Post repeat right carotid endarterectomy. Decreased irregularity of the proximal cervical ICA. 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 Persistent  narrowing and irregularity of the distal ICA with increased eccentric intraluminal filling defect, which may reflect unstable/migrated clot. 60-70% stenosis of the proximal left ICA. No proximal intracranial vessel occlusion. Stable findings of multifocal intracranial atherosclerosis. MRI Brain wo Contrast 8/3 > 1. Unchanged appearance of acute right MCA territory infarct. No new site of acute ischemia. 2. Unchanged generalized atrophy and findings of chronic ischemic Microangiopathy. MR Brain wo Contrast 8/6 > Unchanged appearance of a subacute right MCA territory infarct centered within the right frontal operculum as compared to the MRI of 12/03/2019. No significant mass effect or midline shift. Expected evolution of a known subacute infarct within the right parietal lobe. No interval acute infarct is demonstrated. Stable background generalized parenchymal atrophy and chronic ischemic changes with multiple chronic infarcts as detailed. CT Head wo Contrast 8/11 > 1. Stable appearance of subacute and chronic right MCA infarcts. 2. Stable remote infarcts of the high right frontal lobe and left basal ganglia. 3. No acute intracranial abnormality or significant interval change.  Micro Data:  MRSA 8/3 Negative Respiratory Cx 8/5 > Negative  Blood Cx 8/11 > Negative  Respiratory Cx 8/13 > pansensitive pseudomonas MRSA 8/16 Negative  Antimicrobials:  Cefazolin 8/3 Unasyn 8/5 > 8/12 Zosyn 8/12> 8/16 Ceftazidime 8/16> 8/17  Interim history/subjective:   Tolerating CRRT.  No voiced complaints. Desaturation when off ventilation.  Now tolerating PSV.  Objective   Blood pressure 121/80, pulse 89, temperature (!) 97.4 F (36.3 C), temperature source Axillary, resp. rate 16, height 5\' 6"  (1.676 m), weight 77 kg, SpO2 99 %.    Vent Mode:  PSV;CPAP FiO2 (%):  [40 %] 40 % Set Rate:  [18 bmp] 18 bmp Vt Set:  [510 mL] 510 mL PEEP:  [5 cmH20] 5 cmH20 Pressure Support:  [12 cmH20] 12 cmH20 Plateau  Pressure:  [10 cmH20-21 cmH20] 10 cmH20   Intake/Output Summary (Last 24 hours) at 12/29/2019 1036 Last data filed at 12/29/2019 1000 Gross per 24 hour  Intake 1664 ml  Output 5143 ml  Net -3479 ml   Filed Weights   12/21/19 0500 12/12/2019 0500 12/25/19 0500  Weight: 79.2 kg 77 kg 77 kg   Examination: General: Thin frail male who is awake and following commands. HEENT: Tracheostomy site is intact.  Neuro: Much more awake follows commands moves all extremities CV: Heart sounds are distant but regular PULM: Chest is clear posteriorly.   GI: soft, bsx4 active  GU: Foley catheter has been removed. Extremities: warm/dry, 1+ edema  Skin: no rashes or lesions    Assessment & Plan:   Critically ill due to acute respiratory failure s/p multiple failed extubations, now trach and vent dependent  Trach collar as tolerated  AKI on CKD Now on CRRT. -Continue aggressive fluid removal.   Status post CEA revision with generalized deconditioning -Progressive mobilization.  Best practice:  Diet: tube feeds  Pain/Anxiety/Delirium protocol (if indicated): per protocol VAP protocol (if indicated): per protocol  DVT prophylaxis: SCDs GI prophylaxis: PPI Glucose control: Lantus + SSI Mobility: Progressive ambulation Code Status: FULL Family Communication: Ongoing discussions with family and primary Disposition: ICU  CRITICAL CARE Performed by: [name]   Total critical care time: 35 minutes  Critical care time was exclusive of separately billable procedures and treating other patients.  Critical care was necessary to treat or prevent imminent or life-threatening deterioration.  Critical care was time spent personally by me on the following activities: development of treatment plan with patient and/or surrogate as well as nursing, discussions with consultants, evaluation of patient's response to treatment, examination of patient, obtaining history from patient or surrogate,  ordering and performing treatments and interventions, ordering and review of laboratory studies, ordering and review of radiographic studies, pulse oximetry, re-evaluation of patient's condition and participation in multidisciplinary rounds.  [name], MD [facility] ICU Physician [place]  Pager: [phone] Mobile: [phone] After hours: [phone].   Please consult Amion 12/29/2019, 10:36 AM

## 2019-12-30 ENCOUNTER — Inpatient Hospital Stay (HOSPITAL_COMMUNITY): Payer: Medicare HMO

## 2019-12-30 LAB — RENAL FUNCTION PANEL
Albumin: 2.9 g/dL — ABNORMAL LOW (ref 3.5–5.0)
Albumin: 2.9 g/dL — ABNORMAL LOW (ref 3.5–5.0)
Anion gap: 10 (ref 5–15)
Anion gap: 9 (ref 5–15)
BUN: 27 mg/dL — ABNORMAL HIGH (ref 8–23)
BUN: 27 mg/dL — ABNORMAL HIGH (ref 8–23)
CO2: 24 mmol/L (ref 22–32)
CO2: 25 mmol/L (ref 22–32)
Calcium: 8.6 mg/dL — ABNORMAL LOW (ref 8.9–10.3)
Calcium: 8.7 mg/dL — ABNORMAL LOW (ref 8.9–10.3)
Chloride: 99 mmol/L (ref 98–111)
Chloride: 99 mmol/L (ref 98–111)
Creatinine, Ser: 1.1 mg/dL (ref 0.61–1.24)
Creatinine, Ser: 1.05 mg/dL (ref 0.61–1.24)
GFR calc Af Amer: >60 mL/min (ref >60)
GFR calc Af Amer: >60 mL/min (ref >60)
GFR calc non Af Amer: >60 mL/min (ref >60)
GFR calc non Af Amer: >60 mL/min (ref >60)
Glucose, Bld: 179 mg/dL — ABNORMAL HIGH (ref 70–99)
Glucose, Bld: 127 mg/dL — ABNORMAL HIGH (ref 70–99)
Phosphorus: 2.4 mg/dL — ABNORMAL LOW (ref 2.5–4.6)
Phosphorus: 2 mg/dL — ABNORMAL LOW (ref 2.5–4.6)
Potassium: 5.2 mmol/L — ABNORMAL HIGH (ref 3.5–5.1)
Potassium: 5 mmol/L (ref 3.5–5.1)
Sodium: 133 mmol/L — ABNORMAL LOW (ref 135–145)
Sodium: 133 mmol/L — ABNORMAL LOW (ref 135–145)

## 2019-12-30 LAB — MAGNESIUM: Magnesium: 2.6 mg/dL — ABNORMAL HIGH (ref 1.7–2.4)

## 2019-12-30 LAB — GLUCOSE, CAPILLARY
Glucose-Capillary: 116 mg/dL — ABNORMAL HIGH (ref 70–99)
Glucose-Capillary: 154 mg/dL — ABNORMAL HIGH (ref 70–99)
Glucose-Capillary: 159 mg/dL — ABNORMAL HIGH (ref 70–99)
Glucose-Capillary: 167 mg/dL — ABNORMAL HIGH (ref 70–99)
Glucose-Capillary: 206 mg/dL — ABNORMAL HIGH (ref 70–99)
Glucose-Capillary: 88 mg/dL (ref 70–99)

## 2019-12-30 LAB — BRAIN NATRIURETIC PEPTIDE: B Natriuretic Peptide: 459.8 pg/mL — ABNORMAL HIGH (ref 0.0–100.0)

## 2019-12-30 MED ORDER — PRISMASOL BGK 0/2.5 32-2.5 MEQ/L IV SOLN
INTRAVENOUS | Status: DC
  Filled 2019-12-30 (×8): qty 5000

## 2019-12-30 MED ORDER — SODIUM PHOSPHATES 45 MMOLE/15ML IV SOLN
30.0000 mmol | Freq: Once | INTRAVENOUS | Status: AC
  Administered 2019-12-30: 30 mmol via INTRAVENOUS
  Filled 2019-12-30: qty 10

## 2019-12-30 MED ORDER — PRISMASOL BGK 0/2.5 32-2.5 MEQ/L IV SOLN
INTRAVENOUS | Status: DC
  Filled 2019-12-30 (×14): qty 5000

## 2019-12-30 NOTE — Progress Notes (Addendum)
[facility] NEPHROLOGY PROGRESS NOTE  Assessment/ Plan: Pt is a 79 y.o. yo male  medical history significant for alcohol use disorder, PAD, COPD CAD, DM 2, HLD, HTN, CVAwho present w/endarterectomy revision complicated by ventilatory dependence and AKI  # Severe nonoliguric AKI on CKD 3:  Likely due to cardiorenal syndrome.  Renal ultrasound without obstruction.  Complicated hospital course with fluctuation in serum creatinine level.  Started CRRT on 8/26 for significant volume overload and uremia.   Volume status has significantly improved however not much change in his mental status.  He is still on ventilator. He has been tolerating UF well with -6.6 L so far.  Edema much better therefore I will reduce UF goal to only 50-100 cc an hour.  If not much clinical improvement by tomorrow then I recommend hospice/comfort measures.  I have discussed this with his daughter again today at the bedside. Mild hyperkalemia therefore changed pre and post filter potassium to 2K, IV heparin for systemic anticoagulation.  #Anticoagulation: High PTT however ACT within range.  Discontinue PTT.  Check ACT level.  Discussed with the nurse.  #HTN/Volume Status: Blood pressure acceptable.  UF with CRRT.  #Acute hypoxic respiratory failure/ventilatory dependence: Management per primary team.  He has tracheostomy and on vent.  #Acute pulmonary edema/CHF: Ultrafiltration with dialysis as discussed above.[name]  #Anemia multifactorial including kidney disease, chronic illness: Continue to monitor.  Ferritin 325.  Iron saturation of 12.  Feraheme x2 doses.  Monitor hemoglobin.  Received Aranesp on 8/27.  # Hyponatremia hypervolemic: Managing with dialysis.  #Hyperkalemia: Resolved.   #Acute metabolic encephalopathy: BUN level has improved with CRRT but still remains confused and delirious.   Subjective: Seen and examined.  On vent.  He is alert awake but confused and delirious.  Tolerating CRRT  with 150 cc an hour.  His daughter at bedside.  Objective Vital signs in last 24 hours: Vitals:   12/30/19 0802 12/30/19 0803 12/30/19 0900 12/30/19 1000  BP:   (!) 105/50 (!) 111/58  Pulse:   81 87  Resp:   17 19  Temp:      TempSrc:      SpO2: 92% 95% 100% 100%  Weight:      Height:       Weight change:   Intake/Output Summary (Last 24 hours) at 12/30/2019 1039 Last data filed at 12/30/2019 1000 Gross per 24 hour  Intake 1850.52 ml  Output 4871 ml  Net -3020.48 ml       Labs: Basic Metabolic Panel: Recent Labs  Lab 12/29/19 0348 12/29/19 1616 12/30/19 0400  NA 135 135 133*  K 4.5 5.1 5.2*  CL 99 99 99  CO2 _0 GLUCOSE 219* 348* 179*  BUN 49* 36* 27*  CREATININE 1.39* 1.21 1.10  CALCIUM 8.4* 8.4* 8.6*  PHOS 2.3*  2.3* 2.9 2.4*   Liver Function Tests: Recent Labs  Lab 12/29/19 0348 12/29/19 1616 12/30/19 0400  ALBUMIN 2.8* 2.8* 2.9*   No results for input(s): LIPASE, AMYLASE in the last 168 hours. No results for input(s): AMMONIA in the last 168 hours. CBC: Recent Labs  Lab 12/27/19 1705 12/28/19 0424  WBC  --  15.5*  NEUTROABS  --  13.2*  HGB 9.2* 8.0*  HCT 27.0* 25.8*  MCV  --  95.6  PLT  --  419*   Cardiac Enzymes: No results for input(s): CKTOTAL, CKMB, CKMBINDEX, TROPONINI in the last 168 hours. CBG: Recent Labs  Lab 12/29/19 1535 12/29/19 1927  12/29/19 2310 12/30/19 0328 12/30/19 0807  GLUCAP 302* 172* 74 154* 206*    Iron Studies:  No results for input(s): IRON, TIBC, TRANSFERRIN, FERRITIN in the last 72 hours. Studies/Results: DG Chest Port 1 View  Result Date: 12/29/2019 CLINICAL DATA:  Prior CABG.  Follow-up edema. EXAM: PORTABLE CHEST 1 VIEW COMPARISON:  December 28, 2019 FINDINGS: The a feeding tube terminates in the stomach. Stable tracheostomy tube. Stable right central line. No pneumothorax. Bilateral pulmonary opacities and probable layering effusions are similar in the interval. No other interval changes.  IMPRESSION: Diffuse bilateral pulmonary opacities may represent edema, unchanged. Recommend clinical correlation. Probable layering effusions. The feeding tube terminates in the stomach. Electronically Signed   By: Dorise Bullion III M.D   On: 12/29/2019 15:12   ECHOCARDIOGRAM COMPLETE  Result Date: 12/28/2019    ECHOCARDIOGRAM REPORT   Patient Name:   CLAIR ALFIERI Date of Exam: 12/28/2019 Medical Rec #:  [id number]      Height:       66.0 in Accession #:    [id number]     Weight:       169.8 lb Date of Birth:  [birth year]      BSA:          1.865 m Patient Age:    20 years       BP:           128/68 mmHg Patient Gender: M              HR:           88 bpm. Exam Location:  Inpatient Procedure: 2D Echo Indications:    volume overload  History:        Patient has prior history of Echocardiogram examinations, most                 recent 11/27/2019. CAD, Prior CABG, COPD and Stroke; Risk                 Factors:Diabetes, Hypertension, Dyslipidemia and Former Smoker.                 ETOH Abuse.  Sonographer:    Jannett Celestine RDCS (AE) Referring Phys: [phone] [facility]  Sonographer Comments: Suboptimal apical window and echo performed with patient supine and on artificial respirator. Image acquisition challenging due to respiratory motion. patient had difficulty remaining still at times, which made imaging challenging. no apical window available in a more lateral position. apical window were consequently off axis IMPRESSIONS  1. Left ventricular ejection fraction, by estimation, is 50 to 55%. The left ventricle has low normal function. The left ventricle has no regional wall motion abnormalities. Left ventricular diastolic parameters are indeterminate. There is the interventricular septum is flattened in systole and diastole, consistent with right ventricular pressure and volume overload.  2. Right ventricular systolic function is normal. The right ventricular size is normal.  3. The mitral valve is normal  in structure. Mild to moderate mitral valve regurgitation. No evidence of mitral stenosis.  4. The aortic valve is tricuspid. Aortic valve regurgitation is not visualized. Mild to moderate aortic valve sclerosis/calcification is present, without any evidence of aortic stenosis.  5. The inferior vena cava is normal in size with greater than 50% respiratory variability, suggesting right atrial pressure of 3 mmHg. Comparison(s): EF is mildly improved from prior echocardiogram. FINDINGS  Left Ventricle: Left ventricular ejection fraction, by estimation, is 50 to 55%. The left ventricle has low normal function. The  left ventricle has no regional wall motion abnormalities. The left ventricular internal cavity size was normal in size. There is no left ventricular hypertrophy. The interventricular septum is flattened in systole and diastole, consistent with right ventricular pressure and volume overload. Left ventricular diastolic parameters are indeterminate. Right Ventricle: The right ventricular size is normal. No increase in right ventricular wall thickness. Right ventricular systolic function is normal. Left Atrium: Left atrial size was normal in size. Right Atrium: Right atrial size was normal in size. Pericardium: There is no evidence of pericardial effusion. Mitral Valve: The mitral valve is normal in structure. There is mild thickening of the mitral valve leaflet(s). Normal mobility of the mitral valve leaflets. Mild to moderate mitral valve regurgitation. No evidence of mitral valve stenosis. Tricuspid Valve: The tricuspid valve is normal in structure. Tricuspid valve regurgitation is not demonstrated. No evidence of tricuspid stenosis. Aortic Valve: The aortic valve is tricuspid. Aortic valve regurgitation is not visualized. Mild to moderate aortic valve sclerosis/calcification is present, without any evidence of aortic stenosis. Pulmonic Valve: The pulmonic valve was normal in structure. Pulmonic valve  regurgitation is not visualized. No evidence of pulmonic stenosis. Aorta: The aortic root is normal in size and structure. Venous: The inferior vena cava is normal in size with greater than 50% respiratory variability, suggesting right atrial pressure of 3 mmHg. IAS/Shunts: No atrial level shunt detected by color flow Doppler.  LEFT VENTRICLE PLAX 2D LVIDd:         4.80 cm LVIDs:         3.50 cm LV PW:         1.10 cm LV IVS:        1.10 cm LVOT diam:     2.10 cm LV SV:         48 LV SV Index:   26 LVOT Area:     3.46 cm  LEFT ATRIUM           Index LA diam:      4.30 cm 2.31 cm/m LA Vol (A4C): 24.9 ml 13.35 ml/m  AORTIC VALVE LVOT Vmax:   64.60 cm/s LVOT Vmean:  49.800 cm/s LVOT VTI:    0.138 m  AORTA Ao Root diam: 3.30 cm  SHUNTS Systemic VTI:  0.14 m Systemic Diam: 2.10 cm Candee Furbish MD Electronically signed by Candee Furbish MD Signature Date/Time: 12/28/2019/11:55:30 AM    Final     Medications: Infusions: . sodium chloride Stopped (12/25/19 1702)  . dexmedetomidine (PRECEDEX) IV infusion 0.6 mcg/kg/hr (12/30/19 1000)  . feeding supplement (JEVITY 1.2 CAL) 55 mL/hr at 12/30/19 1000  . ferumoxytol 510 mg (12/23/19 0917)  . prismasol BGK 2/2.5 replacement solution 500 mL/hr at 12/30/19 1025  . prismasol BGK 2/2.5 replacement solution 300 mL/hr at 12/30/19 1024  . prismasol BGK 4/2.5 1,500 mL/hr at 12/30/19 0436    Scheduled Medications: . sodium chloride   Intravenous Once  . apixaban  5 mg Per Tube BID  . atorvastatin  80 mg Per Tube QHS  . budesonide (PULMICORT) nebulizer solution  0.5 mg Nebulization BID  . chlorhexidine gluconate (MEDLINE KIT)  15 mL Mouth Rinse BID  . Chlorhexidine Gluconate Cloth  6 each Topical Q0600  . feeding supplement (PROSource TF)  45 mL Per Tube TID  . insulin aspart  0-15 Units Subcutaneous Q4H  . insulin glargine  15 Units Subcutaneous Daily  . ipratropium-albuterol  3 mL Nebulization TID  . mouth rinse  15 mL Mouth Rinse 10 times per  day  . melatonin  3  mg Per Tube QHS  . multivitamin with minerals  1 tablet Per Tube Daily  . pantoprazole sodium  40 mg Per Tube QHS  . QUEtiapine  25 mg Oral QHS  . senna-docusate  2 tablet Oral QHS    have reviewed scheduled and prn medications.  Physical Exam: General: Remains on vent, confused Heart:RRR, s1s2 nl, no rubs Lungs: Bibasal rhonchi Abdomen:soft, Non-tender, non-distended Extremities: No LE edema. Dialysis Access: Right subclavian nontunneled HD catheter placed by ICU on 8/26.  Delane Stalling Prasad [place] 12/30/2019,10:39 AM  LOS: 26 days  Pager: [phone]

## 2019-12-30 NOTE — Progress Notes (Addendum)
NAME:  Kelly Williams, MRN:  [id number], DOB:  January 23, 1941, LOS: 16 ADMISSION DATE:  12/20/2019, CONSULTATION DATE:  12/12/2019 REFERRING MD:  Oneida Alar, CHIEF COMPLAINT:  Vent management for acute respiratory failure   Brief History   79 year old male with history of recurrent R ICA occlusion post-CEA who developed respiratory failure requiring reintubation and pneumonia. He initially failed self extubation 8/5 after 24 hours off vent, and was again extubated 8/7 with seemingly good toleration. Then in the early am hours of 8/11, several hours after self-removal of Cortrak, he became unresponsive and hypoxemic requiring reintubation and transfer to ICU.  Patient is awaiting trach placement.  Past Medical History   Past Medical History:  Diagnosis Date  . Alcohol abuse   . Alcohol abuse   . Arthritis   . Carotid arterial disease ([place])   . Chronic kidney disease    CKD-patient denies  . COPD (chronic obstructive pulmonary disease) (HCC)    uses inhalers  . Coronary artery disease   . Diabetes mellitus without complication ([place])   . Diabetic retinopathy ([place])   . Hyperlipidemia   . Hypertension   . Normocytic anemia 01/16/2019  . Stroke [facility])    around 2020, 2 after bypass in 2015   Significant Hospital Events   8/3: admitted to [facility], CEA, PCCM consulted for vent management 8/4: self extubated 8/5: Re-intubated overnight 8/7: Extubated 8/11: Reintubation   Consults:  Neurology, Cardiology.  Procedures:  8/3: ETT out 821 12/10/2019 trach per ENT 8/3: R CEA 8/3: Exploration of R CEA and R ICA thrombectomy 8/3: R art line out 12/27/2019 right subclavian hemodialysis catheter Significant Diagnostic Tests:  Carotid duplex 8/3 > right ICA occlusion CTA Head/Neck 8/3 > No acute intracranial abnormality. Post repeat right carotid endarterectomy. Decreased irregularity of the proximal cervical ICA. 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 Persistent narrowing and irregularity of the distal ICA with increased eccentric intraluminal filling defect, which may reflect unstable/migrated clot. 60-70% stenosis of the proximal left ICA. No proximal intracranial vessel occlusion. Stable findings of multifocal intracranial atherosclerosis. MRI Brain wo Contrast 8/3 > 1. Unchanged appearance of acute right MCA territory infarct. No new site of acute ischemia. 2. Unchanged generalized atrophy and findings of chronic ischemic Microangiopathy. MR Brain wo Contrast 8/6 > Unchanged appearance of a subacute right MCA territory infarct centered within the right frontal operculum as compared to the MRI of 12/16/2019. No significant mass effect or midline shift. Expected evolution of a known subacute infarct within the right parietal lobe. No interval acute infarct is demonstrated. Stable background generalized parenchymal atrophy and chronic ischemic changes with multiple chronic infarcts as detailed. CT Head wo Contrast 8/11 > 1. Stable appearance of subacute and chronic right MCA infarcts. 2. Stable remote infarcts of the high right frontal lobe and left basal ganglia. 3. No acute intracranial abnormality or significant interval change.  Micro Data:  MRSA 8/3 Negative Respiratory Cx 8/5 > Negative  Blood Cx 8/11 > Negative  Respiratory Cx 8/13 > pansensitive pseudomonas MRSA 8/16 Negative  Antimicrobials:  Cefazolin 8/3 Unasyn 8/5 > 8/12 Zosyn 8/12> 8/16 Ceftazidime 8/16> 8/17  Interim history/subjective:   Tolerating CRRT.  Tolerating PSV. Marked desaturation when transition to trach collar.  More confused and delirious overnight.  Required initiation of Precedex.  Objective   Blood pressure 112/61, pulse 78, temperature (!) 97.5 F (36.4 C), temperature source Oral, resp. rate 19, height 5\' 6"  (1.676 m), weight 77  kg, SpO2 99 %.    Vent Mode: PSV;CPAP FiO2 (%):  [40 %-50 %] 50 % Set Rate:  [18 bmp] 18 bmp Vt Set:   [510 mL] 510 mL PEEP:  [5 cmH20] 5 cmH20 Pressure Support:  [12 cmH20] 12 cmH20 Plateau Pressure:  [16 cmH20-23 cmH20] 16 cmH20   Intake/Output Summary (Last 24 hours) at 12/30/2019 1135 Last data filed at 12/30/2019 1100 Gross per 24 hour  Intake 1861.98 ml  Output 4848 ml  Net -2986.02 ml   Filed Weights   12/21/19 0500 12/29/2019 0500 12/25/19 0500  Weight: 79.2 kg 77 kg 77 kg   Examination: General: Thin frail male who is awake but not following commands. HEENT: Tracheostomy site is intact.  Neuro: Awake and moves spontaneously but not following commands.  During the likely displaced. CV: Heart sounds are distant but regular PULM: Chest is clear posteriorly.   GI: soft, bsx4 active  GU: Foley catheter has been removed. Extremities: warm/dry, no edema.  Marked muscle wasting. Skin: no rashes or lesions  Assessment & Plan:   Critically ill due to acute respiratory failure s/p multiple failed extubations, now trach and vent dependent  Unclear why he is not tolerating trach collar: Successful fluid removal with CRRT, echo does not show significant underlying cardiac disease. -Repeat chest x-ray -BNP  AKI on CKD Now on CRRT. -Continue aggressive fluid removal.  Encephalopathy due to ICU delirium -Started on Seroquel and haloperidol. -Titrate Precedex infusion to comfort.  Status post CEA revision with generalized deconditioning -Progressive mobilization.  Best practice:  Diet: tube feeds  Pain/Anxiety/Delirium protocol (if indicated): On IV Precedex VAP protocol (if indicated): per protocol  DVT prophylaxis: SCDs GI prophylaxis: PPI Glucose control: Lantus + SSI Mobility: Progressive ambulation Code Status: FULL Family Communication: Daughter was updated at bedside today Disposition: ICU  CRITICAL CARE Performed by: [name]   Total critical care time: 35 minutes  Critical care time was exclusive of separately billable procedures and treating other  patients.  Critical care was necessary to treat or prevent imminent or life-threatening deterioration.  Critical care was time spent personally by me on the following activities: development of treatment plan with patient and/or surrogate as well as nursing, discussions with consultants, evaluation of patient's response to treatment, examination of patient, obtaining history from patient or surrogate, ordering and performing treatments and interventions, ordering and review of laboratory studies, ordering and review of radiographic studies, pulse oximetry, re-evaluation of patient's condition and participation in multidisciplinary rounds.  [name], MD [facility] ICU Physician [place]  Pager: [phone] Mobile: [phone] After hours: [phone].   Please consult Amion 12/30/2019, 11:35 AM

## 2019-12-30 NOTE — Progress Notes (Signed)
  Progress Note    12/30/2019 9:00 AM 8 Days Post-Op  Subjective: Evaluated with daughter at bedside.  He is on Precedex and not interacting during exam.  Vitals:   12/30/19 0802 12/30/19 0803  BP:    Pulse:    Resp:    Temp:    SpO2: 92% 95%    Physical Exam: Awake but not interactive Right neck incision nearly healed  CBC    Component Value Date/Time   WBC 15.5 (H) 12/28/2019 0424   RBC 2.70 (L) 12/28/2019 0424   HGB 8.0 (L) 12/28/2019 0424   HCT 25.8 (L) 12/28/2019 0424   PLT 419 (H) 12/28/2019 0424   MCV 95.6 12/28/2019 0424   MCH 29.6 12/28/2019 0424   MCHC 31.0 12/28/2019 0424   RDW 15.7 (H) 12/28/2019 0424   LYMPHSABS 0.8 12/28/2019 0424   MONOABS 1.2 (H) 12/28/2019 0424   EOSABS 0.2 12/28/2019 0424   BASOSABS 0.1 12/28/2019 0424    BMET    Component Value Date/Time   NA 133 (L) 12/30/2019 0400   K 5.2 (H) 12/30/2019 0400   CL 99 12/30/2019 0400   CO2 24 12/30/2019 0400   GLUCOSE 179 (H) 12/30/2019 0400   BUN 27 (H) 12/30/2019 0400   CREATININE 1.10 12/30/2019 0400   CREATININE 1.34 (H) 08/24/2019 1114   CALCIUM 8.6 (L) 12/30/2019 0400   GFRNONAA >60 12/30/2019 0400   GFRNONAA 50 (L) 08/24/2019 1114   GFRAA >60 12/30/2019 0400   GFRAA 58 (L) 08/24/2019 1114    INR    Component Value Date/Time   INR 1.2 12/25/2019 0453     Intake/Output Summary (Last 24 hours) at 12/30/2019 0900 Last data filed at 12/30/2019 0800 Gross per 24 hour  Intake 1673.08 ml  Output 4675 ml  Net -3001.92 ml     Assessment/plan:  79 y.o. male is s/p R CEA with postop respiratory failure and aki requiring dialysis. Appreciate consulting services care of this patient.   Discussed patient's overall clinical course with his daughter at the bedside.  She is a nurse demonstrates very good understanding.    Karthik Whittinghill C. Donzetta Matters, MD [facility] Office: [phone] Pager: [phone]  12/30/2019 9:00 AM

## 2019-12-30 NOTE — Progress Notes (Signed)
Cardiology Progress Note  Patient ID: Kelly Williams MRN: [id number] DOB: 1941/05/03 Date of Encounter: 12/30/2019  Primary Cardiologist: Buford Dresser, MD  Subjective   Chief Complaint: On Precedex.  Not able to communicate today.  HPI: On CRRT.  On Precedex drip.  Awake, alert, not following commands  ROS:  All other ROS reviewed and negative. Pertinent positives noted in the HPI.     Inpatient Medications  Scheduled Meds:  sodium chloride   Intravenous Once   apixaban  5 mg Per Tube BID   atorvastatin  80 mg Per Tube QHS   budesonide (PULMICORT) nebulizer solution  0.5 mg Nebulization BID   chlorhexidine gluconate (MEDLINE KIT)  15 mL Mouth Rinse BID   Chlorhexidine Gluconate Cloth  6 each Topical Q0600   feeding supplement (PROSource TF)  45 mL Per Tube TID   insulin aspart  0-15 Units Subcutaneous Q4H   insulin glargine  15 Units Subcutaneous Daily   ipratropium-albuterol  3 mL Nebulization TID   mouth rinse  15 mL Mouth Rinse 10 times per day   melatonin  3 mg Per Tube QHS   multivitamin with minerals  1 tablet Per Tube Daily   pantoprazole sodium  40 mg Per Tube QHS   QUEtiapine  25 mg Oral QHS   senna-docusate  2 tablet Oral QHS   Continuous Infusions:  sodium chloride Stopped (12/25/19 1702)   dexmedetomidine (PRECEDEX) IV infusion 0.6 mcg/kg/hr (12/30/19 0900)   feeding supplement (JEVITY 1.2 CAL) 55 mL/hr at 12/30/19 0900   ferumoxytol 510 mg (12/23/19 0917)   prismasol BGK 2/2.5 replacement solution     prismasol BGK 2/2.5 replacement solution     prismasol BGK 4/2.5 1,500 mL/hr at 12/30/19 0436   PRN Meds: sodium chloride, acetaminophen **OR** acetaminophen, fentaNYL (SUBLIMAZE) injection, fentaNYL (SUBLIMAZE) injection, haloperidol lactate, heparin, heparin, hydrALAZINE, ipratropium-albuterol, metoprolol tartrate, ondansetron (ZOFRAN) IV, phenol, polyethylene glycol, potassium chloride   Vital Signs   Vitals:   12/30/19  0800 12/30/19 0802 12/30/19 0803 12/30/19 0900  BP: (!) 98/56   (!) 105/50  Pulse: 75   81  Resp: (!) 26   17  Temp: (!) 97.5 F (36.4 C)     TempSrc: Oral     SpO2: 96% 92% 95% 100%  Weight:      Height:        Intake/Output Summary (Last 24 hours) at 12/30/2019 0945 Last data filed at 12/30/2019 0900 Gross per 24 hour  Intake 1738.96 ml  Output 4959 ml  Net -3220.04 ml   Last 3 Weights 12/25/2019 12/17/2019 12/21/2019  Weight (lbs) 169 lb 12.1 oz 169 lb 12.1 oz 174 lb 9.7 oz  Weight (kg) 77 kg 77 kg 79.2 kg      Telemetry  Overnight telemetry shows atrial fib heart rate 70-80 bpm, which I personally reviewed.   ECG  The most recent ECG shows atrial fib, which I personally reviewed.   Physical Exam   Vitals:   12/30/19 0800 12/30/19 0802 12/30/19 0803 12/30/19 0900  BP: (!) 98/56   (!) 105/50  Pulse: 75   81  Resp: (!) 26   17  Temp: (!) 97.5 F (36.4 C)     TempSrc: Oral     SpO2: 96% 92% 95% 100%  Weight:      Height:         Intake/Output Summary (Last 24 hours) at 12/30/2019 0945 Last data filed at 12/30/2019 0900 Gross per 24 hour  Intake 1738.96 ml  Output 4959 ml  Net -3220.04 ml    Last 3 Weights 12/25/2019 12/18/2019 12/21/2019  Weight (lbs) 169 lb 12.1 oz 169 lb 12.1 oz 174 lb 9.7 oz  Weight (kg) 77 kg 77 kg 79.2 kg    Body mass index is 27.4 kg/m.  General: Ill-appearing Head: Atraumatic, normal size  Eyes: PEERLA, EOMI  Neck: Supple, no JVD, tracheostomy in place Endocrine: No thryomegaly Cardiac: Normal S1, S2; irregular rhythm, no murmurs rubs or gallops Lungs: Diminished breath sounds bilaterally Abd: Soft, nontender, no hepatomegaly  Ext: No edema, pulses 2+ Musculoskeletal: No deformities, BUE and BLE strength normal and equal Skin: Warm and dry, no rashes   Neuro: Awake, not following commands, sedated  Labs  High Sensitivity Troponin:  No results for input(s): TROPONINIHS in the last 720 hours.   Cardiac EnzymesNo results for input(s):  TROPONINI in the last 168 hours. No results for input(s): TROPIPOC in the last 168 hours.  Chemistry Recent Labs  Lab 12/29/19 0348 12/29/19 1616 12/30/19 0400  NA 135 135 133*  K 4.5 5.1 5.2*  CL 99 99 99  CO2 26 24 24   GLUCOSE 219* 348* 179*  BUN 49* 36* 27*  CREATININE 1.39* 1.21 1.10  CALCIUM 8.4* 8.4* 8.6*  ALBUMIN 2.8* 2.8* 2.9*  GFRNONAA 48* 57* >60  GFRAA 55* >60 >60  ANIONGAP 10 12 10     Hematology Recent Labs  Lab 12/27/19 1705 12/28/19 0424  WBC  --  15.5*  RBC  --  2.70*  HGB 9.2* 8.0*  HCT 27.0* 25.8*  MCV  --  95.6  MCH  --  29.6  MCHC  --  31.0  RDW  --  15.7*  PLT  --  419*   BNPNo results for input(s): BNP, PROBNP in the last 168 hours.  DDimer No results for input(s): DDIMER in the last 168 hours.   Radiology  DG Chest Port 1 View  Result Date: 12/29/2019 CLINICAL DATA:  Prior CABG.  Follow-up edema. EXAM: PORTABLE CHEST 1 VIEW COMPARISON:  December 28, 2019 FINDINGS: The a feeding tube terminates in the stomach. Stable tracheostomy tube. Stable right central line. No pneumothorax. Bilateral pulmonary opacities and probable layering effusions are similar in the interval. No other interval changes. IMPRESSION: Diffuse bilateral pulmonary opacities may represent edema, unchanged. Recommend clinical correlation. Probable layering effusions. The feeding tube terminates in the stomach. Electronically Signed   By: Dorise Bullion III M.D   On: 12/29/2019 15:12   ECHOCARDIOGRAM COMPLETE  Result Date: 12/28/2019    ECHOCARDIOGRAM REPORT   Patient Name:   Kelly Williams Date of Exam: 12/28/2019 Medical Rec #:  [id number]      Height:       66.0 in Accession #:    [id number]     Weight:       169.8 lb Date of Birth:  [birth year]      BSA:          1.865 m Patient Age:    79 years       BP:           128/68 mmHg Patient Gender: M              HR:           88 bpm. Exam Location:  Inpatient Procedure: 2D Echo Indications:    volume overload  History:        Patient has  prior history of Echocardiogram examinations, most  recent 11/27/2019. CAD, Prior CABG, COPD and Stroke; Risk                 Factors:Diabetes, Hypertension, Dyslipidemia and Former Smoker.                 ETOH Abuse.  Sonographer:    Jannett Celestine RDCS (AE) Referring Phys: [phone] [facility]  Sonographer Comments: Suboptimal apical window and echo performed with patient supine and on artificial respirator. Image acquisition challenging due to respiratory motion. patient had difficulty remaining still at times, which made imaging challenging. no apical window available in a more lateral position. apical window were consequently off axis IMPRESSIONS  1. Left ventricular ejection fraction, by estimation, is 50 to 55%. The left ventricle has low normal function. The left ventricle has no regional wall motion abnormalities. Left ventricular diastolic parameters are indeterminate. There is the interventricular septum is flattened in systole and diastole, consistent with right ventricular pressure and volume overload.  2. Right ventricular systolic function is normal. The right ventricular size is normal.  3. The mitral valve is normal in structure. Mild to moderate mitral valve regurgitation. No evidence of mitral stenosis.  4. The aortic valve is tricuspid. Aortic valve regurgitation is not visualized. Mild to moderate aortic valve sclerosis/calcification is present, without any evidence of aortic stenosis.  5. The inferior vena cava is normal in size with greater than 50% respiratory variability, suggesting right atrial pressure of 3 mmHg. Comparison(s): EF is mildly improved from prior echocardiogram. FINDINGS  Left Ventricle: Left ventricular ejection fraction, by estimation, is 50 to 55%. The left ventricle has low normal function. The left ventricle has no regional wall motion abnormalities. The left ventricular internal cavity size was normal in size. There is no left ventricular  hypertrophy. The interventricular septum is flattened in systole and diastole, consistent with right ventricular pressure and volume overload. Left ventricular diastolic parameters are indeterminate. Right Ventricle: The right ventricular size is normal. No increase in right ventricular wall thickness. Right ventricular systolic function is normal. Left Atrium: Left atrial size was normal in size. Right Atrium: Right atrial size was normal in size. Pericardium: There is no evidence of pericardial effusion. Mitral Valve: The mitral valve is normal in structure. There is mild thickening of the mitral valve leaflet(s). Normal mobility of the mitral valve leaflets. Mild to moderate mitral valve regurgitation. No evidence of mitral valve stenosis. Tricuspid Valve: The tricuspid valve is normal in structure. Tricuspid valve regurgitation is not demonstrated. No evidence of tricuspid stenosis. Aortic Valve: The aortic valve is tricuspid. Aortic valve regurgitation is not visualized. Mild to moderate aortic valve sclerosis/calcification is present, without any evidence of aortic stenosis. Pulmonic Valve: The pulmonic valve was normal in structure. Pulmonic valve regurgitation is not visualized. No evidence of pulmonic stenosis. Aorta: The aortic root is normal in size and structure. Venous: The inferior vena cava is normal in size with greater than 50% respiratory variability, suggesting right atrial pressure of 3 mmHg. IAS/Shunts: No atrial level shunt detected by color flow Doppler.  LEFT VENTRICLE PLAX 2D LVIDd:         4.80 cm LVIDs:         3.50 cm LV PW:         1.10 cm LV IVS:        1.10 cm LVOT diam:     2.10 cm LV SV:         48 LV SV Index:   26 LVOT Area:  3.46 cm  LEFT ATRIUM           Index LA diam:      4.30 cm 2.31 cm/m LA Vol (A4C): 24.9 ml 13.35 ml/m  AORTIC VALVE LVOT Vmax:   64.60 cm/s LVOT Vmean:  49.800 cm/s LVOT VTI:    0.138 m  AORTA Ao Root diam: 3.30 cm  SHUNTS Systemic VTI:  0.14 m Systemic  Diam: 2.10 cm Candee Furbish MD Electronically signed by Candee Furbish MD Signature Date/Time: 12/28/2019/11:55:30 AM    Final     Cardiac Studies  TTE 12/28/2019 1. Left ventricular ejection fraction, by estimation, is 50 to 55%. The  left ventricle has low normal function. The left ventricle has no regional  wall motion abnormalities. Left ventricular diastolic parameters are  indeterminate. There is the  interventricular septum is flattened in systole and diastole, consistent  with right ventricular pressure and volume overload.  2. Right ventricular systolic function is normal. The right ventricular  size is normal.  3. The mitral valve is normal in structure. Mild to moderate mitral valve  regurgitation. No evidence of mitral stenosis.  4. The aortic valve is tricuspid. Aortic valve regurgitation is not  visualized. Mild to moderate aortic valve sclerosis/calcification is  present, without any evidence of aortic stenosis.  5. The inferior vena cava is normal in size with greater than 50%  respiratory variability, suggesting right atrial pressure of 3 mmHg.   Patient Profile  Kelly Williams is a 79 y.o. male with CAD, PAD, diabetes, hypertension, CVA was admitted on 12/10/2019 for right carotid endarterectomy.  Course has been complicated by postop respiratory failure requiring tracheostomy, volume overload/AKI on CRRT, postop A. fib.  Assessment & Plan   1.  Atrial fibrillation -Secondary to critical illness.  Rate controlled on no medications.  We did discuss starting Eliquis yesterday.  Continue 5 mg twice daily.  2.  Stroke -Status post right carotid endarterectomy.  Aspirin or Plavix should be restarted per discretion of vascular surgery.  3.  AKI/volume overload -Echo with preserved EF.  Collapsible IVC.  Suspect this is all strictly acute kidney injury.  Continue CRRT for now. -I did have a frank discussion with the daughter.  She reports that if things do not improve she is  excepted that she may need to transition to comfort measures.  We will be around to help her with this decision.  I am rounding this week.  Would For questions or updates, please contact [place] Please consult [email or web address] for contact info under   Time Spent with Patient: I have spent a total of 15 minutes with patient reviewing hospital notes, telemetry, EKGs, labs and examining the patient as well as establishing an assessment and plan that was discussed with the patient.  > 50% of time was spent in direct patient care.    Signed, Addison Naegeli. Audie Box, [place]  12/30/2019 9:45 AM

## 2019-12-30 NOTE — Progress Notes (Signed)
eLink Physician-Brief Progress Note Patient Name: Kelly Williams DOB: October 20, 1940 MRN: [id number]   Date of Service  12/30/2019  HPI/Events of Note  K+ 5.2  eICU Interventions  None. Interval re-check.        Kerry Kass Kaiser [place]omini 12/30/2019, 5:53 AM

## 2019-12-31 LAB — GLUCOSE, CAPILLARY
Glucose-Capillary: 137 mg/dL — ABNORMAL HIGH (ref 70–99)
Glucose-Capillary: 123 mg/dL — ABNORMAL HIGH (ref 70–99)
Glucose-Capillary: 175 mg/dL — ABNORMAL HIGH (ref 70–99)
Glucose-Capillary: 117 mg/dL — ABNORMAL HIGH (ref 70–99)
Glucose-Capillary: 177 mg/dL — ABNORMAL HIGH (ref 70–99)

## 2019-12-31 LAB — RENAL FUNCTION PANEL
Albumin: 3 g/dL — ABNORMAL LOW (ref 3.5–5.0)
Albumin: 3 g/dL — ABNORMAL LOW (ref 3.5–5.0)
Anion gap: 14 (ref 5–15)
Anion gap: 12 (ref 5–15)
BUN: 22 mg/dL (ref 8–23)
BUN: 20 mg/dL (ref 8–23)
CO2: 24 mmol/L (ref 22–32)
CO2: 26 mmol/L (ref 22–32)
Calcium: 8.8 mg/dL — ABNORMAL LOW (ref 8.9–10.3)
Calcium: 8.9 mg/dL (ref 8.9–10.3)
Chloride: 97 mmol/L — ABNORMAL LOW (ref 98–111)
Chloride: 98 mmol/L (ref 98–111)
Creatinine, Ser: 1.01 mg/dL (ref 0.61–1.24)
Creatinine, Ser: 0.94 mg/dL (ref 0.61–1.24)
GFR calc Af Amer: >60 mL/min (ref >60)
GFR calc Af Amer: >60 mL/min (ref >60)
GFR calc non Af Amer: >60 mL/min (ref >60)
GFR calc non Af Amer: >60 mL/min (ref >60)
Glucose, Bld: 109 mg/dL — ABNORMAL HIGH (ref 70–99)
Glucose, Bld: 157 mg/dL — ABNORMAL HIGH (ref 70–99)
Phosphorus: 2.4 mg/dL — ABNORMAL LOW (ref 2.5–4.6)
Phosphorus: 1.8 mg/dL — ABNORMAL LOW (ref 2.5–4.6)
Potassium: 4.4 mmol/L (ref 3.5–5.1)
Potassium: 4.3 mmol/L (ref 3.5–5.1)
Sodium: 135 mmol/L (ref 135–145)
Sodium: 136 mmol/L (ref 135–145)

## 2019-12-31 LAB — MAGNESIUM: Magnesium: 2.6 mg/dL — ABNORMAL HIGH (ref 1.7–2.4)

## 2019-12-31 MED ORDER — PHENYLEPHRINE HCL-NACL 10-0.9 MG/250ML-% IV SOLN
INTRAVENOUS | Status: AC
  Filled 2019-12-31: qty 250

## 2019-12-31 MED ORDER — SENNOSIDES 8.8 MG/5ML PO SYRP
10.0000 mL | ORAL_SOLUTION | Freq: Every day | ORAL | Status: DC
  Administered 2019-12-31 – 2020-01-03 (×4): 10 mL
  Filled 2019-12-31 (×4): qty 10

## 2019-12-31 MED ORDER — QUETIAPINE FUMARATE 25 MG PO TABS
25.0000 mg | ORAL_TABLET | Freq: Every day | ORAL | Status: DC
  Administered 2019-12-31: 25 mg

## 2019-12-31 NOTE — Progress Notes (Signed)
NAME:  Kelly Williams, MRN:  [id number], DOB:  01-Aug-1940, LOS: 21 ADMISSION DATE:  12/21/2019, CONSULTATION DATE:  12/12/2019 REFERRING MD:  Oneida Alar, CHIEF COMPLAINT:  Vent management for acute respiratory failure   Brief History   79 year old male with history of recurrent R ICA occlusion post-CEA who developed respiratory failure requiring reintubation and pneumonia. He initially failed self extubation 8/5 after 24 hours off vent, and was again extubated 8/7 with seemingly good toleration. Then in the early am hours of 8/11, several hours after self-removal of Cortrak, he became unresponsive and hypoxemic requiring reintubation and transfer to ICU.  Patient is awaiting trach placement.  Past Medical History   Past Medical History:  Diagnosis Date  . Alcohol abuse   . Alcohol abuse   . Arthritis   . Carotid arterial disease ([place])   . Chronic kidney disease    CKD-patient denies  . COPD (chronic obstructive pulmonary disease) (HCC)    uses inhalers  . Coronary artery disease   . Diabetes mellitus without complication ([place])   . Diabetic retinopathy ([place])   . Hyperlipidemia   . Hypertension   . Normocytic anemia 01/16/2019  . Stroke [facility])    around 2020, 2 after bypass in 2015   Significant Hospital Events   8/3: admitted to [facility], CEA, PCCM consulted for vent management 8/4: self extubated 8/5: Re-intubated overnight 8/7: Extubated 8/11: Reintubation   Consults:  Neurology, Cardiology.  Procedures:  8/3: ETT out 821 12/28/2019 trach per ENT 8/3: R CEA 8/3: Exploration of R CEA and R ICA thrombectomy 8/3: R art line out 12/27/2019 right subclavian hemodialysis catheter Significant Diagnostic Tests:  Carotid duplex 8/3 > right ICA occlusion CTA Head/Neck 8/3 > No acute intracranial abnormality. Post repeat right carotid endarterectomy. Decreased irregularity of the proximal cervical ICA. 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 Persistent narrowing and irregularity of the distal ICA with increased eccentric intraluminal filling defect, which may reflect unstable/migrated clot. 60-70% stenosis of the proximal left ICA. No proximal intracranial vessel occlusion. Stable findings of multifocal intracranial atherosclerosis. MRI Brain wo Contrast 8/3 > 1. Unchanged appearance of acute right MCA territory infarct. No new site of acute ischemia. 2. Unchanged generalized atrophy and findings of chronic ischemic Microangiopathy. MR Brain wo Contrast 8/6 > Unchanged appearance of a subacute right MCA territory infarct centered within the right frontal operculum as compared to the MRI of 12/07/2019. No significant mass effect or midline shift. Expected evolution of a known subacute infarct within the right parietal lobe. No interval acute infarct is demonstrated. Stable background generalized parenchymal atrophy and chronic ischemic changes with multiple chronic infarcts as detailed. CT Head wo Contrast 8/11 > 1. Stable appearance of subacute and chronic right MCA infarcts. 2. Stable remote infarcts of the high right frontal lobe and left basal ganglia. 3. No acute intracranial abnormality or significant interval change.  Micro Data:  MRSA 8/3 Negative Respiratory Cx 8/5 > Negative  Blood Cx 8/11 > Negative  Respiratory Cx 8/13 > pansensitive pseudomonas MRSA 8/16 Negative  Antimicrobials:  Cefazolin 8/3 Unasyn 8/5 > 8/12 Zosyn 8/12> 8/16 Ceftazidime 8/16> 8/17  Interim history/subjective:   Continues on CRRT On PSV this morning Moving spontaneously in bed, BUE restraints   Objective   Blood pressure 123/62, pulse 87, temperature 98.3 F (36.8 C), temperature source Oral, resp. rate (!) 24, height 5\' 6"  (1.676 m), weight 77 kg, SpO2 96 %.    Vent Mode:  PSV;CPAP FiO2 (%):  [40 %-50 %] 50 % Set Rate:  [18 bmp] 18 bmp Vt Set:  [510 mL] 510 mL PEEP:  [5 cmH20] 5 cmH20 Pressure Support:  [5 cmH20-15  cmH20] 15 cmH20 Plateau Pressure:  [11 cmH20-18 cmH20] 18 cmH20   Intake/Output Summary (Last 24 hours) at 12/31/2019 0942 Last data filed at 12/31/2019 0910 Gross per 24 hour  Intake 1687.56 ml  Output 3924 ml  Net -2236.44 ml   Filed Weights   12/21/19 0500 12/23/2019 0500 12/25/19 0500  Weight: 79.2 kg 77 kg 77 kg   Examination: General: Critically and chronically ill appearing elderly M, trach/vent, CRRT, BUE restraints NAD  HEENT: NCAT pink mm Trach secure with scant amount of dried blood  Neuro: Awake, following intermittent commands, moving BUE BLE spontaneously  CV: RRR s1s2. Cap refill < 3 seconds  PULM: Diminished bibasilar sounds, faint crackles bilaterally. No accessory muscle use on PSV/CPAP  GI: thin soft ndnt  GU: WNL  Extremities: BUE BLE muscle wasting. No obvious joint deformity. No cyanosis or clubbing  Skin:c/d/w without rash   Assessment & Plan:   Critically ill due to acute respiratory failure s/p multiple failed extubations, now trach and vent dependent  CXR 8/29 suggestive of pulm edema, BNP 459  P -ENT planning trach change tomorrow -continue efforts and weaning vent, goal trach collar  -continue volume removal via CRRT  -VAP, pulm hygiene  -AM CXR, hopefully attempt ATC again tomorrow   AKI on CKD Now on CRRT. P -CRRT per nephro  Encephalopathy due to ICU delirium -on qHS Seroquel and PRN haloperidol. -Remains on precedex gtt   Status post CEA revision with generalized deconditioning -Progressive mobilization. -PT following   GOC - daughter hoping for very transparent discussions RE goals of care - will continue to attempt vent progress -- may be more telling in coming days   Best practice:  Diet: tube feeds  Pain/Anxiety/Delirium protocol (if indicated): Precedex VAP protocol (if indicated): per protocol  DVT prophylaxis: SCDs GI prophylaxis: PPI Glucose control: Lantus + SSI Mobility: PT/OT  Code Status: FULL Family Communication:  Pending 8/30. Daughter works with outpatient cardiology service  Disposition: ICU  CRITICAL CARE Performed by: Cristal Generous   Total critical care time: 47 minutes  Critical care time was exclusive of separately billable procedures and treating other patients. Critical care was necessary to treat or prevent imminent or life-threatening deterioration.  Critical care was time spent personally by me on the following activities: development of treatment plan with patient and/or surrogate as well as nursing, discussions with consultants, evaluation of patient's response to treatment, examination of patient, obtaining history from patient or surrogate, ordering and performing treatments and interventions, ordering and review of laboratory studies, ordering and review of radiographic studies, pulse oximetry and re-evaluation of patient's condition.  Eliseo Gum MSN, AGACNP-BC [place] [phone] If no answer, [phone] 12/31/2019, 9:44 AM

## 2019-12-31 NOTE — Progress Notes (Addendum)
  Progress Note    12/31/2019 7:48 AM 9 Days Post-Op  Subjective: awake, Alert and able to follow commands   Vitals:   12/31/19 0645 12/31/19 0700  BP: 112/61 (!) 106/51  Pulse: 78 81  Resp: (!) 28 (!) 26  Temp:    SpO2: 97% 94%   Physical Exam: Cardiac:  Regular rate and rhythm Lungs: trach, on vent Incisions:right neck incision almost completely healed Extremities: well perfused and warm. Motor and sensory intact Neurologic: alert. Able to follow commands. Moving all extremities without deficits  CBC    Component Value Date/Time   WBC 15.5 (H) 12/28/2019 0424   RBC 2.70 (L) 12/28/2019 0424   HGB 8.0 (L) 12/28/2019 0424   HCT 25.8 (L) 12/28/2019 0424   PLT 419 (H) 12/28/2019 0424   MCV 95.6 12/28/2019 0424   MCH 29.6 12/28/2019 0424   MCHC 31.0 12/28/2019 0424   RDW 15.7 (H) 12/28/2019 0424   LYMPHSABS 0.8 12/28/2019 0424   MONOABS 1.2 (H) 12/28/2019 0424   EOSABS 0.2 12/28/2019 0424   BASOSABS 0.1 12/28/2019 0424    BMET    Component Value Date/Time   NA 133 (L) 12/30/2019 1242   K 5.0 12/30/2019 1242   CL 99 12/30/2019 1242   CO2 25 12/30/2019 1242   GLUCOSE 127 (H) 12/30/2019 1242   BUN 27 (H) 12/30/2019 1242   CREATININE 1.05 12/30/2019 1242   CREATININE 1.34 (H) 08/24/2019 1114   CALCIUM 8.7 (L) 12/30/2019 1242   GFRNONAA >60 12/30/2019 1242   GFRNONAA 50 (L) 08/24/2019 1114   GFRAA >60 12/30/2019 1242   GFRAA 58 (L) 08/24/2019 1114    INR    Component Value Date/Time   INR 1.2 12/29/2019 0453     Intake/Output Summary (Last 24 hours) at 12/31/2019 0748 Last data filed at 12/31/2019 0700 Gross per 24 hour  Intake 1592.17 ml  Output 4093 ml  Net -2500.83 ml     Assessment/Plan:  79 y.o. male is s/p right CEA with post op respiratory failure and AKI requiring CRRT 9 Days Post-Op. Neuro exam at baseline. He is able to follow commands. Right neck incision healing well. On low dose of precedex. Very deconditioned. Mobilize as able. CRRT and  diuresis per Nephrology. Vent management per CCM. Appreciate consulting services care.   Karoline Caldwell, PA-C [facility] [phone] 12/31/2019 7:48 AM   Obtunded on vent.  Currently on precedex.  He has been on infusion for the last 3 days.  Per report he occasionally becomes agitated.  Will d/w CCM possible weaning of some of the sedation.  Still with renal failure.  Not sure if his kidneys will recover at this point but if he can get off vent and eventually get rid of trach may have some meaningful quality of life.  Will see what progress he makes this week.  Ruta Hinds, MD [facility] [place] Office: [phone]

## 2019-12-31 NOTE — Progress Notes (Signed)
[facility] ROUNDING NOTE   Subjective:   Brief history 79 year old gentleman with history of alcohol use disorder, PAD COPD coronary arteries diabetes mellitus type 2 hyperlipidemia hypertension CVA who underwent endarterectomy complicated by ventilator dependent acute kidney injury.  Renal ultrasound did not reveal any evidence of obstruction.  He started on CRRT 12/27/2019 for significant volume overload and uremia.  He continues to ultrafilter 50-100 cc an hour.  Blood pressure 107/84 pulse 86 temperature 98.4 O2 sats 99% 50% FiO2.  Sodium 135 potassium 4.4 chloride 97 CO2 24 BUN 22 creatinine 1 glucose 109 calcium 8.8 phosphorus 2.4 magnesium 2.6 albumin 3.  Eliquis 5 mg twice daily atorvastatin 80 mg daily, insulin sliding scale, insulin Lantus 15 units daily, melatonin 3 mg daily, multivitamins 1 daily, Protonix 40 mg nightly, Seroquel 25 mg nightly.  Objective:  Vital signs in last 24 hours:  Temp:  [96.4 F (35.8 C)-98.7 F (37.1 C)] 98.3 F (36.8 C) (08/30 0800) Pulse Rate:  [62-96] 86 (08/30 0806) Resp:  [15-32] 31 (08/30 0806) BP: (79-137)/(42-84) 107/84 (08/30 0806) SpO2:  [92 %-100 %] 99 % (08/30 0807) FiO2 (%):  [40 %-50 %] 50 % (08/30 0807)  Weight change:  Filed Weights   12/21/19 0500 01/01/2020 0500 12/25/19 0500  Weight: 79.2 kg 77 kg 77 kg    Intake/Output: I/O last 3 completed shifts: In: 2432.5 [I.V.:283.5; NG/GT:1772; IV Piggyback:377.1] Out: 7673 [ALPFX:9024]   Intake/Output this shift:  Total I/O In: 62.7 [I.V.:7.7; NG/GT:55] Out: 166 [Other:166]  CVS- RRR RS- CTA ABD- BS present soft non-distended EXT- no edema   Basic Metabolic Panel: Recent Labs  Lab 12/28/19 0424 12/28/19 1609 12/29/19 0348 12/29/19 0348 12/29/19 1616 12/29/19 1616 12/30/19 0400 12/30/19 1242 12/31/19 0706  NA 137   < > 135  --  135  --  133* 133* 135  K 4.1   < > 4.5  --  5.1  --  5.2* 5.0 4.4  CL 97*   < > 99  --  99  --  99 99 97*  CO2 27   < >  26  --  24  --  24 25 24   GLUCOSE 150*   < > 219*  --  348*  --  179* 127* 109*  BUN 108*   < > 49*  --  36*  --  27* 27* 22  CREATININE 2.44*   < > 1.39*  --  1.21  --  1.10 1.05 1.01  CALCIUM 8.3*   < > 8.4*   < > 8.4*   < > 8.6* 8.7* 8.8*  MG 2.9*  --  2.7*  --   --   --  2.6*  --  2.6*  PHOS 4.5  4.7*   < > 2.3*  2.3*  --  2.9  --  2.4* 2.0* 2.4*   < > = values in this interval not displayed.    Liver Function Tests: Recent Labs  Lab 12/29/19 0348 12/29/19 1616 12/30/19 0400 12/30/19 1242 12/31/19 0706  ALBUMIN 2.8* 2.8* 2.9* 2.9* 3.0*   No results for input(s): LIPASE, AMYLASE in the last 168 hours. No results for input(s): AMMONIA in the last 168 hours.  CBC: Recent Labs  Lab 12/27/19 1705 12/28/19 0424  WBC  --  15.5*  NEUTROABS  --  13.2*  HGB 9.2* 8.0*  HCT 27.0* 25.8*  MCV  --  95.6  PLT  --  419*    Cardiac Enzymes: No results for input(s):  CKTOTAL, CKMB, CKMBINDEX, TROPONINI in the last 168 hours.  BNP: Invalid input(s): POCBNP  CBG: Recent Labs  Lab 12/30/19 1550 12/30/19 1950 12/30/19 2334 12/31/19 0335 12/31/19 0751  GLUCAP 167* 159* 80 137* 44*    Microbiology: Results for orders placed or performed during the hospital encounter of 12/19/2019  Surgical pcr screen     Status: None   Collection Time: 12/10/2019  8:37 AM   Specimen: Vein; Nasal Swab  Result Value Ref Range Status   MRSA, PCR NEGATIVE NEGATIVE Final   Staphylococcus aureus NEGATIVE NEGATIVE Final    Comment: (NOTE) The Xpert SA Assay (FDA approved for NASAL specimens in patients 59 years of age and older), is one component of a comprehensive surveillance program. It is not intended to diagnose infection nor to guide or monitor treatment. Performed at [facility], [place] [address] [place], [place] [address]   Culture, respiratory (non-expectorated)     Status: None   Collection Time: 12/06/19  9:18 AM   Specimen: Tracheal Aspirate; Respiratory  Result Value Ref  Range Status   Specimen Description TRACHEAL ASPIRATE  Final   Special Requests Normal  Final   Gram Stain NO WBC SEEN NO ORGANISMS SEEN   Final   Culture   Final    RARE Normal respiratory flora-no Staph aureus or Pseudomonas seen Performed at [facility], [address] [place], [place] [address]    Report Status 12/08/2019 FINAL  Final  Culture, blood (routine x 2)     Status: None   Collection Time: 12/12/19  7:17 AM   Specimen: BLOOD LEFT ARM  Result Value Ref Range Status   Specimen Description BLOOD LEFT ARM  Final   Special Requests   Final    BOTTLES DRAWN AEROBIC ONLY Blood Culture adequate volume   Culture   Final    NO GROWTH 5 DAYS Performed at [facility], [place] [address] [place], [place] [address]    Report Status 12/17/2019 FINAL  Final  Culture, blood (routine x 2)     Status: None   Collection Time: 12/12/19  7:17 AM   Specimen: BLOOD LEFT ARM  Result Value Ref Range Status   Specimen Description BLOOD LEFT ARM  Final   Special Requests   Final    BOTTLES DRAWN AEROBIC ONLY Blood Culture adequate volume   Culture   Final    NO GROWTH 5 DAYS Performed at [facility], [place] [address], [place], [place] [address]    Report Status 12/17/2019 FINAL  Final  Culture, respiratory (non-expectorated)     Status: None   Collection Time: 12/14/19  2:40 PM   Specimen: Tracheal Aspirate; Respiratory  Result Value Ref Range Status   Specimen Description TRACHEAL ASPIRATE  Final   Special Requests NONE  Final   Gram Stain   Final    FEW WBC PRESENT, PREDOMINANTLY PMN NO ORGANISMS SEEN Performed at [facility], [address] [place], [place] [address]    Culture RARE PSEUDOMONAS AERUGINOSA  Final   Report Status 12/17/2019 FINAL  Final   Organism ID, Bacteria PSEUDOMONAS AERUGINOSA  Final      Susceptibility   Pseudomonas aeruginosa - MIC*    CEFTAZIDIME 2 SENSITIVE Sensitive     CIPROFLOXACIN <=0.25 SENSITIVE Sensitive     GENTAMICIN  <=1 SENSITIVE Sensitive     IMIPENEM 2 SENSITIVE Sensitive     PIP/TAZO 8 SENSITIVE Sensitive     CEFEPIME  2 SENSITIVE Sensitive     * RARE PSEUDOMONAS AERUGINOSA  Surgical PCR screen     Status: None   Collection Time: 12/17/19  9:29 AM   Specimen: Nasal Mucosa; Nasal Swab  Result Value Ref Range Status   MRSA, PCR NEGATIVE NEGATIVE Final   Staphylococcus aureus NEGATIVE NEGATIVE Final    Comment: (NOTE) The Xpert SA Assay (FDA approved for NASAL specimens in patients 79 years of age and older), is one component of a comprehensive surveillance program. It is not intended to diagnose infection nor to guide or monitor treatment. Performed at [facility], [place] [address], [place], [place] [address]     Coagulation Studies: No results for input(s): LABPROT, INR in the last 72 hours.  Urinalysis: No results for input(s): COLORURINE, LABSPEC, PHURINE, GLUCOSEU, HGBUR, BILIRUBINUR, KETONESUR, PROTEINUR, UROBILINOGEN, NITRITE, LEUKOCYTESUR in the last 72 hours.  Invalid input(s): APPERANCEUR    Imaging: DG CHEST PORT 1 VIEW  Result Date: 12/30/2019 CLINICAL DATA:  CHF EXAM: PORTABLE CHEST 1 VIEW COMPARISON:  December 29, 2019 FINDINGS: Diffuse bilateral interstitial opacities remain, similar in the interval. The elevated right hemidiaphragm is stable. Right basilar opacities probably atelectasis. Stable tracheostomy tube. The feeding tube terminates below today's film. Stable right central line. No pneumothorax. IMPRESSION: 1. Support apparatus as above. 2. Bilateral pulmonary opacities likely represent edema given history. An infectious process could have the same appearance however. Recommend clinical correlation. Electronically Signed   By: Dorise Bullion III M.D   On: 12/30/2019 15:06     Medications:   . sodium chloride Stopped (12/25/19 1702)  . dexmedetomidine (PRECEDEX) IV infusion 0.4 mcg/kg/hr (12/31/19 0800)  . feeding supplement (JEVITY 1.2 CAL) 1,000 mL (12/30/19  1845)  . prismasol BGK 2/2.5 replacement solution 500 mL/hr at 12/31/19 0739  . prismasol BGK 2/2.5 replacement solution 300 mL/hr at 12/31/19 0349  . prismasol BGK 4/2.5 1,500 mL/hr at 12/31/19 0521   . sodium chloride   Intravenous Once  . apixaban  5 mg Per Tube BID  . atorvastatin  80 mg Per Tube QHS  . budesonide (PULMICORT) nebulizer solution  0.5 mg Nebulization BID  . chlorhexidine gluconate (MEDLINE KIT)  15 mL Mouth Rinse BID  . Chlorhexidine Gluconate Cloth  6 each Topical Q0600  . feeding supplement (PROSource TF)  45 mL Per Tube TID  . insulin aspart  0-15 Units Subcutaneous Q4H  . insulin glargine  15 Units Subcutaneous Daily  . ipratropium-albuterol  3 mL Nebulization TID  . mouth rinse  15 mL Mouth Rinse 10 times per day  . melatonin  3 mg Per Tube QHS  . multivitamin with minerals  1 tablet Per Tube Daily  . pantoprazole sodium  40 mg Per Tube QHS  . QUEtiapine  25 mg Oral QHS  . senna-docusate  2 tablet Oral QHS   sodium chloride, acetaminophen **OR** acetaminophen, fentaNYL (SUBLIMAZE) injection, fentaNYL (SUBLIMAZE) injection, haloperidol lactate, heparin, hydrALAZINE, ipratropium-albuterol, metoprolol tartrate, ondansetron (ZOFRAN) IV, phenol, polyethylene glycol  Assessment/ Plan:  1. Severe nonoliguric AKI on CKD 3: Likely due to cardiorenal syndrome.  Renal ultrasound without obstruction.  Complicated hospital course with fluctuation in serum creatinine level.  Started CRRT on 8/26 for significant volume overload and uremia.    Continues to tolerate 50-100 cc an hour   2.Anticoagulation: Continues to use apixaban 5 mg twice daily  3.HTN/Volume Status: Continue ultrafiltration 50-100 cc an hour  4.Acute hypoxic respiratory failure/ventilatory dependence: Management per primary team.  He has tracheostomy and  on vent.  5.Acute pulmonary edema/CHF: Ultrafiltration with dialysis as discussed above.[name]  6.Anemia multifactorial including kidney disease,  chronic illness: Continue to monitor.Ferritin 325. Iron saturation of 12. Feraheme x2 doses.  Monitor hemoglobin. Received Aranesp on 8/27.  7. Hyponatremia hypervolemic: Managing with dialysis.  #Hyperkalemia: Resolved.   #Acute metabolic encephalopathy: BUN level has improved with CRRT but still remains confused and delirious.     LOS: [place] @TODAY @8 :44 AM

## 2019-12-31 NOTE — Progress Notes (Signed)
SLP Cancellation Note  Patient Details Name: Kelly Williams MRN: [id number] DOB: 06/25/1940   Cancelled treatment:       Reason Eval/Treat Not Completed: Medical issues which prohibited therapy (on vent this am). Will continue to follow.    Osie Bond., M.A. [place] [facility] Pager [phone] Office [phone]  12/31/2019, 10:28 AM

## 2019-12-31 NOTE — Progress Notes (Signed)
PT Cancellation Note  Patient Details Name: Kelly Williams MRN: [id number] DOB: 1940-05-15   Cancelled Treatment:    Reason Eval/Treat Not Completed: Medical issues which prohibited therapy. Pt now on CRRT, Rn asked to hold as pt's BPs are soft. Acute PT to return as able/as appropriate to progress mobility.   Kittie Plater, PT, DPT Acute Rehabilitation Services Pager #: [phone] Office #: [phone]   Berline Lopes 12/31/2019, 7:56 AM

## 2019-12-31 NOTE — Progress Notes (Signed)
Subjective: Pt is still on vent. Not responsive.  Objective: Vital signs in last 24 hours: Temp:  [96.4 F (35.8 C)-98.7 F (37.1 C)] 98.3 F (36.8 C) (08/30 0800) Pulse Rate:  [62-96] 87 (08/30 0900) Resp:  [15-32] 24 (08/30 0900) BP: (79-137)/(42-84) 123/62 (08/30 0900) SpO2:  [92 %-100 %] 96 % (08/30 0900) FiO2 (%):  [40 %-50 %] 50 % (08/30 0807)  Physical Exam: Head: NCAT. No lesion. Ears: Examination of the ears shows normal auricles and external auditory canals bilaterally.  Nose: Nasal examination shows normal mucosa, septum, turbinates.  Face: Facial examination shows no asymmetry.  Mouth: Oral cavity examination shows no mucosal abnormalities. Neck:Right neck incision is c/d/i.The trachtube is in place and venting well. Neuro:On vent.  No results for input(s): WBC, HGB, HCT, PLT in the last 72 hours. Recent Labs    12/30/19 1242 12/31/19 0706  NA 133* 135  K 5.0 4.4  CL 99 97*  CO2 25 24  GLUCOSE 127* 109*  BUN 27* 22  CREATININE 1.05 1.01  CALCIUM 8.7* 8.8*    Medications:  I have reviewed the patient's current medications. Scheduled: . sodium chloride   Intravenous Once  . apixaban  5 mg Per Tube BID  . atorvastatin  80 mg Per Tube QHS  . budesonide (PULMICORT) nebulizer solution  0.5 mg Nebulization BID  . chlorhexidine gluconate (MEDLINE KIT)  15 mL Mouth Rinse BID  . Chlorhexidine Gluconate Cloth  6 each Topical Q0600  . feeding supplement (PROSource TF)  45 mL Per Tube TID  . insulin aspart  0-15 Units Subcutaneous Q4H  . insulin glargine  15 Units Subcutaneous Daily  . ipratropium-albuterol  3 mL Nebulization TID  . mouth rinse  15 mL Mouth Rinse 10 times per day  . melatonin  3 mg Per Tube QHS  . multivitamin with minerals  1 tablet Per Tube Daily  . pantoprazole sodium  40 mg Per Tube QHS  . QUEtiapine  25 mg Oral QHS  . senna-docusate  2 tablet Oral QHS   Continuous: . sodium chloride Stopped (12/25/19 1702)  . dexmedetomidine  (PRECEDEX) IV infusion 0.5 mcg/kg/hr (12/31/19 0900)  . feeding supplement (JEVITY 1.2 CAL) 1,000 mL (12/30/19 1845)  . prismasol BGK 2/2.5 replacement solution 500 mL/hr at 12/31/19 0739  . prismasol BGK 2/2.5 replacement solution 300 mL/hr at 12/31/19 0349  . prismasol BGK 4/2.5 1,500 mL/hr at 12/31/19 0901    Assessment/Plan: POD #9s/p trach. Pt is still ventilator dependent. - ICU does not have #6 cuffed Shiley tube. Will need to order one from central supply. - Wean vent as tolerated. - Will change trach tomorrow.   LOS: 27 days   Kelly Williams W Kelly Williams 12/31/2019, 9:30 AM

## 2020-01-01 ENCOUNTER — Inpatient Hospital Stay (HOSPITAL_COMMUNITY): Payer: Medicare HMO

## 2020-01-01 DIAGNOSIS — D649 Anemia, unspecified: Secondary | ICD-10-CM

## 2020-01-01 LAB — POCT ACTIVATED CLOTTING TIME
Activated Clotting Time: 208 seconds
Activated Clotting Time: 208 seconds
Activated Clotting Time: 208 seconds
Activated Clotting Time: 208 seconds
Activated Clotting Time: 213 seconds
Activated Clotting Time: 213 seconds
Activated Clotting Time: 213 seconds
Activated Clotting Time: 213 seconds
Activated Clotting Time: 213 seconds
Activated Clotting Time: 213 seconds
Activated Clotting Time: 219 seconds

## 2020-01-01 LAB — RENAL FUNCTION PANEL
Albumin: 2.9 g/dL — ABNORMAL LOW (ref 3.5–5.0)
Albumin: 3.1 g/dL — ABNORMAL LOW (ref 3.5–5.0)
Anion gap: 12 (ref 5–15)
Anion gap: 13 (ref 5–15)
BUN: 21 mg/dL (ref 8–23)
BUN: 22 mg/dL (ref 8–23)
CO2: 26 mmol/L (ref 22–32)
CO2: 25 mmol/L (ref 22–32)
Calcium: 9.2 mg/dL (ref 8.9–10.3)
Calcium: 9.1 mg/dL (ref 8.9–10.3)
Chloride: 98 mmol/L (ref 98–111)
Chloride: 98 mmol/L (ref 98–111)
Creatinine, Ser: 0.94 mg/dL (ref 0.61–1.24)
Creatinine, Ser: 0.88 mg/dL (ref 0.61–1.24)
GFR calc Af Amer: >60 mL/min (ref >60)
GFR calc Af Amer: >60 mL/min (ref >60)
GFR calc non Af Amer: >60 mL/min (ref >60)
GFR calc non Af Amer: >60 mL/min (ref >60)
Glucose, Bld: 154 mg/dL — ABNORMAL HIGH (ref 70–99)
Glucose, Bld: 178 mg/dL — ABNORMAL HIGH (ref 70–99)
Phosphorus: 1.6 mg/dL — ABNORMAL LOW (ref 2.5–4.6)
Phosphorus: 3.9 mg/dL (ref 2.5–4.6)
Potassium: 4.6 mmol/L (ref 3.5–5.1)
Potassium: 4.3 mmol/L (ref 3.5–5.1)
Sodium: 136 mmol/L (ref 135–145)
Sodium: 136 mmol/L (ref 135–145)

## 2020-01-01 LAB — HEMOGLOBIN AND HEMATOCRIT, BLOOD
HCT: 27.1 % — ABNORMAL LOW (ref 39.0–52.0)
Hemoglobin: 8.5 g/dL — ABNORMAL LOW (ref 13.0–17.0)

## 2020-01-01 LAB — PREPARE RBC (CROSSMATCH)

## 2020-01-01 LAB — GLUCOSE, CAPILLARY
Glucose-Capillary: 147 mg/dL — ABNORMAL HIGH (ref 70–99)
Glucose-Capillary: 115 mg/dL — ABNORMAL HIGH (ref 70–99)
Glucose-Capillary: 184 mg/dL — ABNORMAL HIGH (ref 70–99)
Glucose-Capillary: 192 mg/dL — ABNORMAL HIGH (ref 70–99)
Glucose-Capillary: 165 mg/dL — ABNORMAL HIGH (ref 70–99)
Glucose-Capillary: 168 mg/dL — ABNORMAL HIGH (ref 70–99)
Glucose-Capillary: 201 mg/dL — ABNORMAL HIGH (ref 70–99)

## 2020-01-01 LAB — CBC
HCT: 23.3 % — ABNORMAL LOW (ref 39.0–52.0)
Hemoglobin: 7 g/dL — ABNORMAL LOW (ref 13.0–17.0)
MCH: 30.3 pg (ref 26.0–34.0)
MCHC: 30 g/dL (ref 30.0–36.0)
MCV: 100.9 fL — ABNORMAL HIGH (ref 80.0–100.0)
Platelets: 306 10*3/uL (ref 150–400)
RBC: 2.31 MIL/uL — ABNORMAL LOW (ref 4.22–5.81)
RDW: 17 % — ABNORMAL HIGH (ref 11.5–15.5)
WBC: 19.9 10*3/uL — ABNORMAL HIGH (ref 4.0–10.5)
nRBC: 0.2 % (ref 0.0–0.2)

## 2020-01-01 LAB — MAGNESIUM: Magnesium: 2.8 mg/dL — ABNORMAL HIGH (ref 1.7–2.4)

## 2020-01-01 MED ORDER — SODIUM CHLORIDE 0.9% IV SOLUTION: Freq: Once | INTRAVENOUS | Status: AC

## 2020-01-01 MED ORDER — SODIUM PHOSPHATES 45 MMOLE/15ML IV SOLN
30.0000 mmol | Freq: Once | INTRAVENOUS | Status: AC
  Administered 2020-01-01: 30 mmol via INTRAVENOUS
  Filled 2020-01-01: qty 10

## 2020-01-01 MED ORDER — THIAMINE HCL 100 MG/ML IJ SOLN: 100.0000 mg | Freq: Every day | INTRAMUSCULAR | Status: DC

## 2020-01-01 MED ORDER — FOLIC ACID 1 MG PO TABS
1.0000 mg | ORAL_TABLET | Freq: Every day | ORAL | Status: DC
  Administered 2020-01-01 – 2020-01-04 (×4): 1 mg
  Filled 2020-01-01 (×4): qty 1

## 2020-01-01 MED ORDER — THIAMINE HCL 100 MG PO TABS
100.0000 mg | ORAL_TABLET | Freq: Every day | ORAL | Status: DC
  Administered 2020-01-01 – 2020-01-04 (×4): 100 mg
  Filled 2020-01-01 (×4): qty 1

## 2020-01-01 MED ORDER — MELATONIN 3 MG PO TABS
3.0000 mg | ORAL_TABLET | Freq: Every day | ORAL | Status: DC
  Administered 2020-01-01 – 2020-01-02 (×2): 3 mg
  Filled 2020-01-01 (×3): qty 1

## 2020-01-01 MED ORDER — QUETIAPINE FUMARATE 25 MG PO TABS
50.0000 mg | ORAL_TABLET | Freq: Every day | ORAL | Status: DC
  Administered 2020-01-01 – 2020-01-03 (×3): 50 mg
  Filled 2020-01-01 (×3): qty 2

## 2020-01-01 MED ORDER — SODIUM CHLORIDE 0.9 % IV SOLN
2.0000 g | Freq: Two times a day (BID) | INTRAVENOUS | Status: DC
  Administered 2020-01-01 – 2020-01-03 (×5): 2 g via INTRAVENOUS
  Filled 2020-01-01 (×6): qty 2

## 2020-01-01 NOTE — Progress Notes (Signed)
Physical Therapy Treatment Patient Details Name: Kelly Williams MRN: [id number] DOB: 03-19-1941 Today's Date: 01/01/2020    History of Present Illness Pt is a 79 y/o male with PMH of stroke, HTN, DM, periphreal neuropathy, carotid disease with L carotid endarterectomy, CABG x 5, R shoulder arthroplasthy, medication noncompliance and alcohol abuse. Daughter found patient after falling and intoxicated, L facial droop, slurring speech and aggression (reports hx of mulitple falls). MRI reveals acute ischemic infarcts of R frontal and parietal lobes. MRA of neck revealed focal stenosis in the right internal carotid artery approximately 3 cm distal to the bifurcation with an adjacent 11 mm aneurysmal dilatation. Pt underwent R carotid endarterectomy on 8/3. Pt with change in neuro status after procedure and was taken back to OR for exploration with no significant findings. Pt required intubation on 8/5 due to desaturation, extubated 12/08/19.  Reintubated on 8/11 due to hypoxia and transferred to ICU. Pt underwent tracheostomy on 8/21.    PT Comments    Pt very agitated and grabbing and pinching therapist upon arrival. Pt very clearly wanting to get OOB. Due to CRRT, pt being on the vent, and his severe agitation and impulsivity PT/OT decided not to transfer to EOB for safety reason and worked with pt in the bed in egress position. Pt calmed down immensely and followed simple commands by PT and OT. Pt was no longer trying to pinch therapist. Pt engaged with OT with ADLs and participated in LE stretching. Worked on pt using bed rails to pull self forward to get off back. Pt left in chair position as pt was much calmer with dtr and RN in the room with bilat wrist restraints and posey belt on. Acute PT to cont to follow.   Follow Up Recommendations  SNF     Equipment Recommendations  Wheelchair (measurements PT);Wheelchair cushion (measurements PT);Hospital bed    Recommendations for Other Services        Precautions / Restrictions Precautions Precautions: Fall Precaution Comments: trach, vent, CRRT Restrictions Weight Bearing Restrictions: No    Mobility  Bed Mobility Overal bed mobility: Needs Assistance Bed Mobility: Rolling Rolling: Mod assist;+2 for safety/equipment;+2 for physical assistance         General bed mobility comments: pt rolled L/R for pad placement/change  Transfers                 General transfer comment: not safe today  Ambulation/Gait             General Gait Details: unable   Stairs             Wheelchair Mobility    Modified Rankin (Stroke Patients Only) Modified Rankin (Stroke Patients Only) Pre-Morbid Rankin Score: Moderate disability Modified Rankin: Severe disability     Balance Overall balance assessment: Needs assistance Sitting-balance support: Feet unsupported;Bilateral upper extremity supported (sitting in bed in egress position) Sitting balance-Leahy Scale: Poor Sitting balance - Comments: requiring modA                                    Cognition Arousal/Alertness: Awake/alert Behavior During Therapy: Agitated Overall Cognitive Status: Impaired/Different from baseline Area of Impairment: Following commands;Safety/judgement;Problem solving;Attention                   Current Attention Level: Sustained Memory: Decreased short-term memory Following Commands: Follows one step commands with increased time;Follows one step commands consistently;Follows multi-step commands inconsistently  Safety/Judgement: Decreased awareness of safety;Decreased awareness of deficits (pt trying to pull self up out of bed) Awareness: Emergent (able to state he had a BM) Problem Solving: Requires verbal cues;Requires tactile cues General Comments: pt initially very restless, pinching and grabbing tightly through mittens and wrist restraints, both PT and OT      Exercises Other Exercises Other Exercises:  passive hamstring stretching    General Comments General comments (skin integrity, edema, etc.): VSS      Pertinent Vitals/Pain Pain Assessment: Faces Faces Pain Scale: Hurts little more Pain Location: discomfort with bilat hamstring stretch, grimacing with movement Pain Descriptors / Indicators: Discomfort;Guarding Pain Intervention(s): Monitored during session    Home Living                      Prior Function            PT Goals (current goals can now be found in the care plan section) Progress towards PT goals: Progressing toward goals    Frequency    Min 2X/week      PT Plan Current plan remains appropriate    Co-evaluation PT/OT/SLP Co-Evaluation/Treatment: Yes Reason for Co-Treatment: For patient/therapist safety PT goals addressed during session: Mobility/safety with mobility        AM-PAC PT "6 Clicks" Mobility   Outcome Measure  Help needed turning from your back to your side while in a flat bed without using bedrails?: Total Help needed moving from lying on your back to sitting on the side of a flat bed without using bedrails?: Total Help needed moving to and from a bed to a chair (including a wheelchair)?: Total Help needed standing up from a chair using your arms (e.g., wheelchair or bedside chair)?: Total Help needed to walk in hospital room?: Total Help needed climbing 3-5 steps with a railing? : Total 6 Click Score: 6    End of Session Equipment Utilized During Treatment: Oxygen Activity Tolerance: Patient tolerated treatment well Patient left: in bed;with call bell/phone within reach;with restraints reapplied;with nursing/sitter in room;with family/visitor present (left in chair position) Nurse Communication: Mobility status PT Visit Diagnosis: Other abnormalities of gait and mobility (R26.89)     Time: 0277-4128 PT Time Calculation (min) (ACUTE ONLY): 42 min  Charges:  $Therapeutic Activity: 8-22 mins                      Kittie Plater, PT, DPT Acute Rehabilitation Services Pager #: [phone] Office #: [phone]    Berline Lopes 01/01/2020, 2:44 PM

## 2020-01-01 NOTE — Progress Notes (Signed)
Subjective: Pt awake and responsive. On vent via trach.  Objective: Vital signs in last 24 hours: Temp:  [97 F (36.1 C)-98.8 F (37.1 C)] 98.1 F (36.7 C) (08/31 0800) Pulse Rate:  [70-94] 88 (08/31 1200) Resp:  [16-38] 26 (08/31 1200) BP: (85-144)/(44-92) 144/89 (08/31 1200) SpO2:  [98 %-100 %] 99 % (08/31 1200) FiO2 (%):  [40 %-50 %] 40 % (08/31 0810)  Physical Exam: Head: NCAT. No lesion. Ears: Examination of the ears shows normal auricles and external auditory canals bilaterally.  Nose: Nasal examination shows normal mucosa, septum, turbinates.  Face: Facial examination shows no asymmetry.  Mouth: Oral cavity examination shows no mucosalabnormalities. Neck:Right neck incision is c/d/i.The trachtube is in place and venting well. Neuro:Awake and responsive.  Recent Labs    01/01/20 0544  WBC 19.9*  HGB 7.0*  HCT 23.3*  PLT 306   Recent Labs    12/31/19 1603 01/01/20 0544  NA 136 136  K 4.3 4.6  CL 98 98  CO2 26 26  GLUCOSE 157* 154*  BUN 20 21  CREATININE 0.94 0.94  CALCIUM 8.9 9.2    Medications:  I have reviewed the patient's current medications. Scheduled: . sodium chloride   Intravenous Once  . apixaban  5 mg Per Tube BID  . atorvastatin  80 mg Per Tube QHS  . budesonide (PULMICORT) nebulizer solution  0.5 mg Nebulization BID  . chlorhexidine gluconate (MEDLINE KIT)  15 mL Mouth Rinse BID  . Chlorhexidine Gluconate Cloth  6 each Topical Q0600  . feeding supplement (PROSource TF)  45 mL Per Tube TID  . folic acid  1 mg Per Tube Daily  . insulin aspart  0-15 Units Subcutaneous Q4H  . insulin glargine  15 Units Subcutaneous Daily  . ipratropium-albuterol  3 mL Nebulization TID  . mouth rinse  15 mL Mouth Rinse 10 times per day  . melatonin  3 mg Per Tube QHS  . multivitamin with minerals  1 tablet Per Tube Daily  . pantoprazole sodium  40 mg Per Tube QHS  . QUEtiapine  50 mg Per Tube QHS  . sennosides  10 mL Per Tube QHS  . thiamine  100 mg  Per Tube Daily   Or  . thiamine  100 mg Intravenous Daily   Continuous: . sodium chloride 10 mL/hr at 01/01/20 1200  . ceFEPime (MAXIPIME) IV Stopped (01/01/20 1111)  . dexmedetomidine (PRECEDEX) IV infusion Stopped (01/01/20 0919)  . feeding supplement (JEVITY 1.2 CAL) 1,000 mL (01/01/20 0933)  . prismasol BGK 2/2.5 replacement solution 500 mL/hr at 01/01/20 0420  . prismasol BGK 2/2.5 replacement solution 300 mL/hr at 12/31/19 2132  . prismasol BGK 4/2.5 1,500 mL/hr at 01/01/20 0905  . sodium phosphate  Dextrose 5% IVPB 43 mL/hr at 01/01/20 1200    Assessment/Plan: POD #10s/p trach.Pt is still ventilator dependent. - Trach changed to a new #6 cuffed Shiley. - Wean vent as tolerated. - Routine trach care. -. Will sign off   LOS: 28 days   Kelly Williams W Charnise Lovan 01/01/2020, 12:21 PM

## 2020-01-01 NOTE — Progress Notes (Signed)
Occupational therapy Treatment Note  Pt seen as cotreat with PT. Pt appeared agitated at beginning of session, reaching/grabbing/pinching therapists. Due to behavior, used Egress position to move pt into supported sitting to work on postural control, trunk rotation, endurance. reaching and completing simple ADL tasks.Able to achieve figure four position sittingto assist with LB ADL.  Pt following commands and demonstrated good participation once mobility increased. Pt left in chair position with daughter present. Recommend pt be placed in chair position TID, blinds open, lights on durign the day to reduce signs/symptoms of delirium. Recommend rehab at SNF. Will continue to follow acutely.     01/01/20 1600  OT Visit Information  Last OT Received On 01/01/20  Assistance Needed +2  PT/OT/SLP Co-Evaluation/Treatment Yes  Reason for Co-Treatment Complexity of the patient's impairments (multi-system involvement);Necessary to address cognition/behavior during functional activity;For patient/therapist safety  OT goals addressed during session ADL's and self-care  History of Present Illness Pt is a 79 y/o male with PMH of stroke, HTN, DM, periphreal neuropathy, carotid disease with L carotid endarterectomy, CABG x 5, R shoulder arthroplasthy, medication noncompliance and alcohol abuse. Daughter found patient after falling and intoxicated, L facial droop, slurring speech and aggression (reports hx of mulitple falls). MRI reveals acute ischemic infarcts of R frontal and parietal lobes. MRA of neck revealed focal stenosis in the right internal carotid artery approximately 3 cm distal to the bifurcation with an adjacent 11 mm aneurysmal dilatation. Pt underwent R carotid endarterectomy on 8/3. Pt with change in neuro status after procedure and was taken back to OR for exploration with no significant findings. Pt required intubation on 8/5 due to desaturation, extubated 12/08/19.  Reintubated on 8/11 due to hypoxia and  transferred to ICU. Pt underwent tracheostomy on 8/21.  Precautions  Precautions Fall  Precaution Comments trach, vent, CRRT  Pain Assessment  Pain Assessment Faces  Faces Pain Scale 4  Pain Location discomfort with bilat hamstring stretch, grimacing with movement  Pain Descriptors / Indicators Discomfort;Guarding  Pain Intervention(s) Limited activity within patient's tolerance  Cognition  Arousal/Alertness Awake/alert  Behavior During Therapy Agitated  Overall Cognitive Status Impaired/Different from baseline  Area of Impairment Following commands;Safety/judgement;Problem solving;Attention  Current Attention Level Sustained  Memory Decreased short-term memory  Following Commands Follows one step commands with increased time;Follows one step commands consistently;Follows multi-step commands inconsistently  Safety/Judgement Decreased awareness of safety;Decreased awareness of deficits (pt trying to pull self up out of bed)  Awareness Emergent (able to state he had a BM)  Problem Solving Requires verbal cues;Requires tactile cues;Slow processing  General Comments pt initially very restless, pinching and grabbing tightly through mittens and wrist restraints, both PT and OT  Difficult to assess due to Tracheostomy  Upper Extremity Assessment  Upper Extremity Assessment Generalized weakness  RUE Deficits / Details overall stronger than LUE; using RUE to pull foward in Egress position; using to rub lotion on legs in figure four position  LUE Deficits / Details AROM WFL.  strength grossly 3+/5 - weaker from previous stroke; decreased in-hand manipulation skills; appears weaker distally  LUE Coordination decreased fine motor;decreased gross motor  Lower Extremity Assessment  Lower Extremity Assessment Defer to PT evaluation  ADL  Overall ADL's  Needs assistance/impaired  Lower Body Bathing Maximal assistance;Bed level  Lower Body Bathing Details (indicate cue type and reason) figure four  positon used  Functional mobility during ADLs +2 for physical assistance  General ADL Comments did not attempt grooming as pt initially agitated and did not want  to encourage hands toward trach/coretrack; pt able to cross legs and rub lotion on BLE using primarily R hand; washed hands with washcloth  Bed Mobility  Overal bed mobility Needs Assistance  Bed Mobility Rolling  Rolling Mod assist;+2 for safety/equipment;+2 for physical assistance  General bed mobility comments pt rolled L/R for pad placement/change  Balance  Overall balance assessment Needs assistance  Sitting-balance support Feet unsupported;Bilateral upper extremity supported (sitting in bed in egress position)  Sitting balance-Leahy Scale Poor  Sitting balance - Comments requiring modA  Transfers  General transfer comment not safe today  General Exercises - Upper Extremity  Shoulder Flexion Both;10 reps;AAROM;Seated  Elbow Flexion Strengthening;Both;10 reps;Seated  Other Exercises  Other Exercises passive hamstring stretching  OT - End of Session  Equipment Utilized During Treatment Other (comment) (vent)  Activity Tolerance Patient tolerated treatment well  Patient left in bed;with call bell/phone within reach;with bed alarm set;with family/visitor present;with restraints reapplied (chair position)  Nurse Communication Mobility status;Other (comment) (encourage chair position)  OT Assessment/Plan  OT Plan Discharge plan remains appropriate  OT Visit Diagnosis Unsteadiness on feet (R26.81);Other abnormalities of gait and mobility (R26.89);Muscle weakness (generalized) (M62.81);Other symptoms and signs involving cognitive function;Pain  Symptoms and signs involving cognitive functions Other cerebrovascular disease  OT Frequency (ACUTE ONLY) Min 2X/week  Follow Up Recommendations SNF;Supervision/Assistance - 24 hour  OT Equipment Other (comment) (TBA)  AM-PAC OT "6 Clicks" Daily Activity Outcome Measure (Version 2)   Help from another person eating meals? 1  Help from another person taking care of personal grooming? 1  Help from another person toileting, which includes using toliet, bedpan, or urinal? 1  Help from another person bathing (including washing, rinsing, drying)? 2  Help from another person to put on and taking off regular upper body clothing? 1  Help from another person to put on and taking off regular lower body clothing? 1  6 Click Score 7  OT Goal Progression  Progress towards OT goals Progressing toward goals  Acute Rehab OT Goals  Patient Stated Goal none stated  OT Goal Formulation With patient/family  Time For Goal Achievement 01/15/20  Potential to Achieve Goals Good  ADL Goals  Pt Will Perform Grooming with min assist;sitting  Pt Will Perform Lower Body Bathing with supervision;sit to/from stand  Pt Will Perform Lower Body Dressing with supervision;sit to/from stand  Pt Will Transfer to Toilet with supervision;ambulating  Pt Will Perform Toileting - Clothing Manipulation and hygiene with supervision;sit to/from stand  Pt Will Perform Tub/Shower Transfer Tub transfer;with supervision;ambulating;shower seat;rolling walker  Additional ADL Goal #1 Pt will maintain midline postural control in unsupported sitting x 5 min with VSS  Pt Will Perform Upper Body Bathing with set-up;with supervision;sitting  Pt/caregiver will Perform Home Exercise Program With minimal assist;Both right and left upper extremity;Increased strength;With written HEP provided  Additional ADL Goal #2 Pt will complete 10 min ADL task with less than 5 rest breaks to improve activity tolerance  OT Time Calculation  OT Start Time (ACUTE ONLY) 1237  OT Stop Time (ACUTE ONLY) 1316  OT Time Calculation (min) 39 min  OT General Charges  $OT Visit 1 Visit  OT Treatments  $Self Care/Home Management  8-22 mins  $Therapeutic Activity 8-22 mins  Maurie Boettcher, OT/L   Acute OT Clinical Specialist [place] Pager [phone] Office [phone]

## 2020-01-01 NOTE — Progress Notes (Signed)
Palliative:  HPI: 79 y.o.malewith past medical history of stroke, hypertension, hyperlipidemia, diabetic retinopathy, DM, CAD, s/p CABG, COPD, CKD, arthritis, ETOH useadmitted on 8/3/2021with history of recurrent right ICA occlusion s/p CEA. Developed respiratory failure requiring intubation. Patient self-extubated 8/5, again extubated 8/7. On 8/11 AM, patient became unresponsive and hypoxemic requiring re-intubation and transfer to ICU. Trach placed 8/21. PT/OT following. Likely will need LTACH placement. Patient now with worsening AKI on CKD, possibly cardiorenal syndrome. Nephrology consulted. Patient receiving high-dose IV diuretics but worsening creatinine. May requiring dialysis and poor candidate for long-term dialysis with underlying chronic conditions and current condition with tracheostomy/vent dependency. Palliative medicine consultation for goals of care.  I met today at Mr. Hawker bedside with daughter, [name]. [name] was discussing with Dr. Nona Dell and Eliseo Gum PCCM NP. Also visited by Dr. Justin Mend and Dr. Lynetta Mare. Mr. Persley continues with delirium and with concern for worsening secretions making trach collar trials difficult. Continues on CRRT and tolerating well with some limitations with low BP at times.   We discussed further goals of care and [name] would like to give more time to allow for improvement. Given that Mr. Berns was in chair and communicating appropriately just a few days ago I feel this is reasonable. However, we also discussed that the longer this takes (he is 28 days in hospitalization now) the more concern I have for his long term outlook and quality of life. [name] feels that he has desired continued full aggressive care with hopes of improvement. She would ideally want his mentation to improve to help [place] with decisions. She knows that he would not want long term support from vent and rely on others for his care. She is not sure he would even want dialysis  long term. We discussed that we will follow his progress and either we will see the improvements we need to give up hope that he will have improvement or we will know this is not working because he will continue to decline or plateau. [name] understands. I also gave her a Hard Choices booklet.   All questions/concerns addressed. Emotional support provided. I will continue to follow.   Exam: Alert but restless. Will follow some commands at times per RN. Frail. Trach to vent and tolerating vent without dyssynchrony and weaning at times. Abd soft, flat. CRRT in place and tolerating. Moves all extremities.   Plan: - Full code, full aggressive care for now.  - Awaiting for patient to declare himself within the next week.  - Ongoing support to be provided to daughter.   [place], NP Palliative Medicine Team Pager [phone] (Please see [email or web address] for schedule) Team Phone [phone]    Greater than 50%  of this time was spent counseling and coordinating care related to the above assessment and plan

## 2020-01-01 NOTE — Progress Notes (Addendum)
NAME:  Kelly Williams, MRN:  [id number], DOB:  11-Mar-1941, LOS: 9 ADMISSION DATE:  12/18/2019, CONSULTATION DATE:  12/12/2019 REFERRING MD:  Oneida Alar, CHIEF COMPLAINT:  Vent management for acute respiratory failure   Brief History   79 year old male with history of recurrent R ICA occlusion post-CEA who developed respiratory failure requiring reintubation and pneumonia. He initially failed self extubation 8/5 after 24 hours off vent, and was again extubated 8/7 with seemingly good toleration. Then in the early am hours of 8/11, several hours after self-removal of Cortrak, he became unresponsive and hypoxemic requiring reintubation and transfer to ICU.  Patient is awaiting trach placement.  Past Medical History   Past Medical History:  Diagnosis Date  . Alcohol abuse   . Alcohol abuse   . Arthritis   . Carotid arterial disease ([place])   . Chronic kidney disease    CKD-patient denies  . COPD (chronic obstructive pulmonary disease) (HCC)    uses inhalers  . Coronary artery disease   . Diabetes mellitus without complication ([place])   . Diabetic retinopathy ([place])   . Hyperlipidemia   . Hypertension   . Normocytic anemia 01/16/2019  . Stroke [facility])    around 2020, 2 after bypass in 2015   Significant Hospital Events   8/3: admitted to [facility], CEA, PCCM consulted for vent management 8/4: self extubated 8/5: Re-intubated overnight 8/7: Extubated 8/11: Reintubation   Consults:  Neurology, Cardiology.  Procedures:  8/3: ETT out 821 12/05/2019 trach per ENT 8/3: R CEA 8/3: Exploration of R CEA and R ICA thrombectomy 8/3: R art line out 12/27/2019 right subclavian hemodialysis catheter Significant Diagnostic Tests:  Carotid duplex 8/3 > right ICA occlusion CTA Head/Neck 8/3 > No acute intracranial abnormality. Post repeat right carotid endarterectomy. Decreased irregularity of the proximal cervical ICA. 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 Persistent narrowing and irregularity of the distal ICA with increased eccentric intraluminal filling defect, which may reflect unstable/migrated clot. 60-70% stenosis of the proximal left ICA. No proximal intracranial vessel occlusion. Stable findings of multifocal intracranial atherosclerosis. MRI Brain wo Contrast 8/3 > 1. Unchanged appearance of acute right MCA territory infarct. No new site of acute ischemia. 2. Unchanged generalized atrophy and findings of chronic ischemic Microangiopathy. MR Brain wo Contrast 8/6 > Unchanged appearance of a subacute right MCA territory infarct centered within the right frontal operculum as compared to the MRI of 12/16/2019. No significant mass effect or midline shift. Expected evolution of a known subacute infarct within the right parietal lobe. No interval acute infarct is demonstrated. Stable background generalized parenchymal atrophy and chronic ischemic changes with multiple chronic infarcts as detailed. CT Head wo Contrast 8/11 > 1. Stable appearance of subacute and chronic right MCA infarcts. 2. Stable remote infarcts of the high right frontal lobe and left basal ganglia. 3. No acute intracranial abnormality or significant interval change. CXR 8/31> bilateral infiltrates, edema. Slight improvement form prior. Small R pleural effusion  Micro Data:  MRSA 8/3 Negative Respiratory Cx 8/5 > Negative  Blood Cx 8/11 > Negative  Respiratory Cx 8/13 > pansensitive pseudomonas MRSA 8/16 Negative Tracheal aspirate 8/31>>  Antimicrobials:  Cefazolin 8/3 Unasyn 8/5 > 8/12 Zosyn 8/12> 8/16 Ceftazidime 8/16> 8/17 Cefepime 8/31>>   Interim history/subjective:  Continues on CRRT PSV/CPAP this morning, but with increased thick secretions, WBC 19.9   Objective   Blood pressure (!) 91/46, pulse 79, temperature 98.1 F (36.7 C), temperature source Oral, resp.  rate (!) 25, height 5\' 6"  (1.676 m), weight 77 kg, SpO2 100 %.    Vent Mode:  CPAP;PSV FiO2 (%):  [40 %-50 %] 40 % Set Rate:  [18 bmp] 18 bmp Vt Set:  [510 mL] 510 mL PEEP:  [5 cmH20] 5 cmH20 Pressure Support:  [15 cmH20] 15 cmH20 Plateau Pressure:  [14 cmH20-21 cmH20] 14 cmH20   Intake/Output Summary (Last 24 hours) at 01/01/2020 0902 Last data filed at 01/01/2020 0800 Gross per 24 hour  Intake 1634.7 ml  Output 2818 ml  Net -1183.3 ml   Filed Weights   12/21/19 0500 12/03/2019 0500 12/25/19 0500  Weight: 79.2 kg 77 kg 77 kg   Examination: General: Critically and chronically ill appearing older adult M, trach/vent, CRRT, restraints NAD  HEENT: NCAT, temporal muscle wasting. Trach secure. Trachea midline. Anicteric sclera  Neuro: Awake, confused appearing. Not following commands. Moving BUE BLE spontaneously.  PERRL. Mouthing words and gesturing to daughter CV: rrr s1s2 no rgm cap refill < 3 seconds  PULM: Diminished bibasilar sounds. Scattered upper lobe rhonchi. No accessory muscle use on PSV/CPAP  GI: thin, soft ndnt  GU: WNL Extremities: Symmetrical BUE BLE muscle wasting. No obvious joint deformity  Skin:c/d/w without rash   Assessment & Plan:   Critically ill due to acute respiratory failure s/p multiple failed extubations, now trach and vent dependent  CXR 8/31 with diffuse bilateral infiltrates/edema (slight improvement from 8/29) Small R pleural effusion  Suspected HAP: 8/31 Increased thick secretions, WBC 19.9, CXR as above  P -ENT planning trach change 8/31 -continue efforts and weaning vent: goal trach collar. With increased thick secretions 8/31 will not attempt today  -continue volume removal via CRRT  -Will get tracheal aspirate and start empiric Cefepime   AKI on CKD Now on CRRT. P -CRRT per nephro -Volume removal via CRRT   Encephalopathy due to ICU delirium Hx Bipolar disorder Hx EtOH use, variable intake report  -increasing qHS seroquel to 50mg  from 25mg  -Delirium precautions  -Will shut off precedex 8/31 day shift, Prefer to  allow patient to be more hyperactive during day shift  -Multivit, BVit   Anemia -critical illness, renal failure, iatrogenic blood loss in setting of labs -Hgb 7 8/31  P -Trend CBC -Transfuse if Hgb <7 -- doesn't need transfusion at present but is borderline. If hypotensive, transfuse.  -will order type and screen in anticipation of likely transfusion in next day or so   Hypophosphatemia -replacing 8/31  -trend BMP mag phos replace as needed   Status post CEA revision with generalized deconditioning -Progressive mobilization. -PT following   GOC - daughter hoping for very transparent discussions RE goals of care - Will continue to attempt progress from delirium and ventilatory standpoint -- anticipate that if we are unable to make progress in coming days/week patient may declare himself   Best practice:  Diet: tube feeds  Pain/Anxiety/Delirium protocol (if indicated): qHS seroquel. Precedex available  VAP protocol (if indicated): per protocol  DVT prophylaxis: SCDs GI prophylaxis: PPI Glucose control: Lantus + SSI Mobility: PT/OT  Code Status: FULL Family Communication: Daughter updated at bedside 8/31 with myself, VVS and palliative care  Disposition: ICU  CRITICAL CARE Performed by: Cristal Generous   Total critical care time: 56 minutes  Critical care time was exclusive of separately billable procedures and treating other patients. Critical care was necessary to treat or prevent imminent or life-threatening deterioration.  Critical care was time spent personally by me on the following activities: development  of treatment plan with patient and/or surrogate as well as nursing, discussions with consultants, evaluation of patient's response to treatment, examination of patient, obtaining history from patient or surrogate, ordering and performing treatments and interventions, ordering and review of laboratory studies, ordering and review of radiographic studies, pulse oximetry  and re-evaluation of patient's condition.

## 2020-01-01 NOTE — Progress Notes (Addendum)
  Progress Note    01/01/2020 9:02 AM 10 Days Post-Op  Subjective: somewhat agitated this morning   Vitals:   01/01/20 0800 01/01/20 0810  BP: (!) 91/46   Pulse: 79   Resp: (!) 25   Temp: 98.1 F (36.7 C)   SpO2: 100% 100%   Physical Exam: Lungs:  Mechanical ventilation Incisions:  R neck incision healed Extremities:  Moving all extremities Neurologic: sedated  CBC    Component Value Date/Time   WBC 19.9 (H) 01/01/2020 0544   RBC 2.31 (L) 01/01/2020 0544   HGB 7.0 (L) 01/01/2020 0544   HCT 23.3 (L) 01/01/2020 0544   PLT 306 01/01/2020 0544   MCV 100.9 (H) 01/01/2020 0544   MCH 30.3 01/01/2020 0544   MCHC 30.0 01/01/2020 0544   RDW 17.0 (H) 01/01/2020 0544   LYMPHSABS 0.8 12/28/2019 0424   MONOABS 1.2 (H) 12/28/2019 0424   EOSABS 0.2 12/28/2019 0424   BASOSABS 0.1 12/28/2019 0424    BMET    Component Value Date/Time   NA 136 01/01/2020 0544   K 4.6 01/01/2020 0544   CL 98 01/01/2020 0544   CO2 26 01/01/2020 0544   GLUCOSE 154 (H) 01/01/2020 0544   BUN 21 01/01/2020 0544   CREATININE 0.94 01/01/2020 0544   CREATININE 1.34 (H) 08/24/2019 1114   CALCIUM 9.2 01/01/2020 0544   GFRNONAA >60 01/01/2020 0544   GFRNONAA 50 (L) 08/24/2019 1114   GFRAA >60 01/01/2020 0544   GFRAA 58 (L) 08/24/2019 1114    INR    Component Value Date/Time   INR 1.2 12/05/2019 0453     Intake/Output Summary (Last 24 hours) at 01/01/2020 0902 Last data filed at 01/01/2020 0800 Gross per 24 hour  Intake 1634.7 ml  Output 2818 ml  Net -1183.3 ml     Assessment/Plan:  79 y.o. male is s/p redo R CEA with post op respiratory and renal failure 10 Days Post-Op   Somewhat agitated this morning; weaning precedex may be difficult currently CRRT per Nephrology Plans noted for trach change today per Dr. Ignacia Felling, PA-C [facility] [phone] 01/01/2020 9:02 AM  Confused today.  Clinical condition d/w CCM and pt daughter at bedside.  Will try  to eliminate sedation to see if we can clear his mental status to get him off the vent.  Still on dialysis CCM to RX possible pneumonia  Ruta Hinds, MD [facility] [place] Office: [phone]

## 2020-01-01 NOTE — Progress Notes (Signed)
Pharmacy Antibiotic Note  Kelly Williams is a 79 y.o. male admitted on 12/26/2019 with concern for new pneumonia.  Pharmacy has been consulted for Cefepime dosing. Of note, patient recently completed a course of Zosyn and ceftaizidime for pneumonia.   Patient remains on CRRT with worsening leukocytosis. He is afebrile.   Plan: -Start Cefepime 2 gm IV Q 12 hours -Monitor CBC cultures and clinical progress   Height: 5\' 6"  (167.6 cm) Weight: 77 kg (169 lb 12.1 oz) IBW/kg (Calculated) : 63.8  Temp (24hrs), Avg:98 F (36.7 C), Min:97 F (36.1 C), Max:98.9 F (37.2 C)  Recent Labs  Lab 12/28/19 0424 12/28/19 1609 12/30/19 0400 12/30/19 1242 12/31/19 0706 12/31/19 1603 01/01/20 0544  WBC 15.5*  --   --   --   --   --  19.9*  CREATININE 2.44*   < > 1.10 1.05 1.01 0.94 0.94   < > = values in this interval not displayed.    Estimated Creatinine Clearance: 62.3 mL/min (by C-G formula based on SCr of 0.94 mg/dL).    Allergies  Allergen Reactions  . Metformin And Related Diarrhea    Unasyn 8/5 > 8/12 (rising Na, didn't want CTX/Flagyl) Zosyn 8/12 >> 8/16 Ceftaz 8/16 > 8/17 Cefepime 8/31 >>   8/3 MRSA PCR - negative 8/5 TA - negative 8/11 BCx - NegF  8/13 TA - rare Pseudomonas (pan-S)  8/16 surgical PCR - negative  Thank you for allowing pharmacy to be a part of this patient's care.  Albertina Parr, PharmD., BCPS, BCCCP Clinical Pharmacist Clinical phone for 01/01/20 until 3:30pm: [phone] If after 3:30pm, please refer to [facility] for unit-specific pharmacist

## 2020-01-01 NOTE — Progress Notes (Signed)
[facility] ROUNDING NOTE   Subjective:   Brief history 79 year old gentleman with history of alcohol use disorder, PAD COPD coronary arteries diabetes mellitus type 2 hyperlipidemia hypertension CVA who underwent endarterectomy complicated by ventilator dependent acute kidney injury.  Renal ultrasound did not reveal any evidence of obstruction.  He started on CRRT 12/27/2019 for significant volume overload and uremia.  He continues to ultrafilter 50-100 cc an hour.  Blood pressure 95/74 pulse 76 temperature 98.8 O2 sats 100% FiO2 50%  Sodium 136 potassium 4.6 chloride 98 CO2 26 BUN 421 creatinine 0.94 glucose 154 phosphorus 1.6 calcium 9.2 magnesium 2.8 albumin 2.9 hemoglobin 7.0 WBC 19.9  Eliquis 5 mg twice daily atorvastatin 80 mg daily, insulin sliding scale, insulin Lantus 15 units daily, melatonin 3 mg daily, multivitamins 1 daily, Protonix 40 mg nightly, Seroquel 25 mg nightly.  Objective:  Vital signs in last 24 hours:  Temp:  [97 F (36.1 C)-98.9 F (37.2 C)] 98.8 F (37.1 C) (08/31 0400) Pulse Rate:  [70-94] 82 (08/31 0730) Resp:  [16-38] 25 (08/31 0730) BP: (85-142)/(44-86) 95/74 (08/31 0730) SpO2:  [96 %-100 %] 100 % (08/31 0730) FiO2 (%):  [40 %-50 %] 50 % (08/31 0347)  Weight change:  Filed Weights   12/21/19 0500 12/15/2019 0500 12/25/19 0500  Weight: 79.2 kg 77 kg 77 kg    Intake/Output: I/O last 3 completed shifts: In: 2460.7 [I.V.:302; Other:150; RW/ER:1540; IV Piggyback:61.7] Out: 0867 [Other:4971]   Intake/Output this shift:  No intake/output data recorded.  CVS- RRR RS- CTA ABD- BS present soft non-distended EXT- no edema   Basic Metabolic Panel: Recent Labs  Lab 12/28/19 0424 12/28/19 1609 12/29/19 0348 12/29/19 1616 12/30/19 0400 12/30/19 0400 12/30/19 1242 12/30/19 1242 12/31/19 0706 12/31/19 1603 01/01/20 0544  NA 137   < > 135   < > 133*  --  133*  --  135 136 136  K 4.1   < > 4.5   < > 5.2*  --  5.0  --  4.4 4.3 4.6   CL 97*   < > 99   < > 99  --  99  --  97* 98 98  CO2 27   < > 26   < > 24  --  25  --  24 26 26   GLUCOSE 150*   < > 219*   < > 179*  --  127*  --  109* 157* 154*  BUN 108*   < > 49*   < > 27*  --  27*  --  22 20 21   CREATININE 2.44*   < > 1.39*   < > 1.10  --  1.05  --  1.01 0.94 0.94  CALCIUM 8.3*   < > 8.4*   < > 8.6*   < > 8.7*   < > 8.8* 8.9 9.2  MG 2.9*  --  2.7*  --  2.6*  --   --   --  2.6*  --  2.8*  PHOS 4.5  4.7*   < > 2.3*  2.3*   < > 2.4*  --  2.0*  --  2.4* 1.8* 1.6*   < > = values in this interval not displayed.    Liver Function Tests: Recent Labs  Lab 12/30/19 0400 12/30/19 1242 12/31/19 0706 12/31/19 1603 01/01/20 0544  ALBUMIN 2.9* 2.9* 3.0* 3.0* 2.9*   No results for input(s): LIPASE, AMYLASE in the last 168 hours. No results for input(s): AMMONIA in the last  168 hours.  CBC: Recent Labs  Lab 12/27/19 1705 12/28/19 0424 01/01/20 0544  WBC  --  15.5* 19.9*  NEUTROABS  --  13.2*  --   HGB 9.2* 8.0* 7.0*  HCT 27.0* 25.8* 23.3*  MCV  --  95.6 100.9*  PLT  --  419* 306    Cardiac Enzymes: No results for input(s): CKTOTAL, CKMB, CKMBINDEX, TROPONINI in the last 168 hours.  BNP: Invalid input(s): POCBNP  CBG: Recent Labs  Lab 12/31/19 0751 12/31/19 1134 12/31/19 1526 12/31/19 1945 01/01/20 0748  GLUCAP 123* 175* 117* 177* 184*    Microbiology: Results for orders placed or performed during the hospital encounter of 12/07/2019  Surgical pcr screen     Status: None   Collection Time: 12/31/2019  8:37 AM   Specimen: Vein; Nasal Swab  Result Value Ref Range Status   MRSA, PCR NEGATIVE NEGATIVE Final   Staphylococcus aureus NEGATIVE NEGATIVE Final    Comment: (NOTE) The Xpert SA Assay (FDA approved for NASAL specimens in patients 16 years of age and older), is one component of a comprehensive surveillance program. It is not intended to diagnose infection nor to guide or monitor treatment. Performed at [facility], [place] [address],  [place], [place] [address]   Culture, respiratory (non-expectorated)     Status: None   Collection Time: 12/06/19  9:18 AM   Specimen: Tracheal Aspirate; Respiratory  Result Value Ref Range Status   Specimen Description TRACHEAL ASPIRATE  Final   Special Requests Normal  Final   Gram Stain NO WBC SEEN NO ORGANISMS SEEN   Final   Culture   Final    RARE Normal respiratory flora-no Staph aureus or Pseudomonas seen Performed at [facility], [address], [place], [place] [address]    Report Status 12/08/2019 FINAL  Final  Culture, blood (routine x 2)     Status: None   Collection Time: 12/12/19  7:17 AM   Specimen: BLOOD LEFT ARM  Result Value Ref Range Status   Specimen Description BLOOD LEFT ARM  Final   Special Requests   Final    BOTTLES DRAWN AEROBIC ONLY Blood Culture adequate volume   Culture   Final    NO GROWTH 5 DAYS Performed at [facility], [place] [address] [place], [place] [address]    Report Status 12/17/2019 FINAL  Final  Culture, blood (routine x 2)     Status: None   Collection Time: 12/12/19  7:17 AM   Specimen: BLOOD LEFT ARM  Result Value Ref Range Status   Specimen Description BLOOD LEFT ARM  Final   Special Requests   Final    BOTTLES DRAWN AEROBIC ONLY Blood Culture adequate volume   Culture   Final    NO GROWTH 5 DAYS Performed at [facility], [place] [address] [place], [place] [address]    Report Status 12/17/2019 FINAL  Final  Culture, respiratory (non-expectorated)     Status: None   Collection Time: 12/14/19  2:40 PM   Specimen: Tracheal Aspirate; Respiratory  Result Value Ref Range Status   Specimen Description TRACHEAL ASPIRATE  Final   Special Requests NONE  Final   Gram Stain   Final    FEW WBC PRESENT, PREDOMINANTLY PMN NO ORGANISMS SEEN Performed at [facility], [address], [place], [place] [address]    Culture RARE PSEUDOMONAS AERUGINOSA  Final   Report Status 12/17/2019 FINAL  Final   Organism  ID, Bacteria  PSEUDOMONAS AERUGINOSA  Final      Susceptibility   Pseudomonas aeruginosa - MIC*    CEFTAZIDIME 2 SENSITIVE Sensitive     CIPROFLOXACIN <=0.25 SENSITIVE Sensitive     GENTAMICIN <=1 SENSITIVE Sensitive     IMIPENEM 2 SENSITIVE Sensitive     PIP/TAZO 8 SENSITIVE Sensitive     CEFEPIME 2 SENSITIVE Sensitive     * RARE PSEUDOMONAS AERUGINOSA  Surgical PCR screen     Status: None   Collection Time: 12/17/19  9:29 AM   Specimen: Nasal Mucosa; Nasal Swab  Result Value Ref Range Status   MRSA, PCR NEGATIVE NEGATIVE Final   Staphylococcus aureus NEGATIVE NEGATIVE Final    Comment: (NOTE) The Xpert SA Assay (FDA approved for NASAL specimens in patients 78 years of age and older), is one component of a comprehensive surveillance program. It is not intended to diagnose infection nor to guide or monitor treatment. Performed at [facility], [place] [address] [place], [place] [address]     Coagulation Studies: No results for input(s): LABPROT, INR in the last 72 hours.  Urinalysis: No results for input(s): COLORURINE, LABSPEC, PHURINE, GLUCOSEU, HGBUR, BILIRUBINUR, KETONESUR, PROTEINUR, UROBILINOGEN, NITRITE, LEUKOCYTESUR in the last 72 hours.  Invalid input(s): APPERANCEUR    Imaging: DG CHEST PORT 1 VIEW  Result Date: 01/01/2020 CLINICAL DATA:  Pulmonary edema. EXAM: PORTABLE CHEST 1 VIEW COMPARISON:  12/30/2019. FINDINGS: Tracheostomy tube, feeding tube, right subclavian line in stable position. Prior CABG. Heart size stable. Diffuse bilateral pulmonary infiltrates/edema with slight improvement from prior exam. Small right pleural effusion again noted. No pneumothorax. Right shoulder replacement. IMPRESSION: 1.  Lines and tubes stable position. 2.  Prior CABG.  Heart size stable. 3. Diffuse bilateral pulmonary infiltrates/edema with slight improvement from prior exam. Small right pleural effusion again noted. Electronically Signed   By: Marcello Moores  Register   On: 01/01/2020 05:37   DG  CHEST PORT 1 VIEW  Result Date: 12/30/2019 CLINICAL DATA:  CHF EXAM: PORTABLE CHEST 1 VIEW COMPARISON:  December 29, 2019 FINDINGS: Diffuse bilateral interstitial opacities remain, similar in the interval. The elevated right hemidiaphragm is stable. Right basilar opacities probably atelectasis. Stable tracheostomy tube. The feeding tube terminates below today's film. Stable right central line. No pneumothorax. IMPRESSION: 1. Support apparatus as above. 2. Bilateral pulmonary opacities likely represent edema given history. An infectious process could have the same appearance however. Recommend clinical correlation. Electronically Signed   By: Dorise Bullion III M.D   On: 12/30/2019 15:06     Medications:   . sodium chloride Stopped (12/25/19 1702)  . dexmedetomidine (PRECEDEX) IV infusion 0.6 mcg/kg/hr (01/01/20 0700)  . feeding supplement (JEVITY 1.2 CAL) 1,000 mL (12/31/19 1314)  . prismasol BGK 2/2.5 replacement solution 500 mL/hr at 01/01/20 0420  . prismasol BGK 2/2.5 replacement solution 300 mL/hr at 12/31/19 2132  . prismasol BGK 4/2.5 1,500 mL/hr at 01/01/20 0541   . sodium chloride   Intravenous Once  . apixaban  5 mg Per Tube BID  . atorvastatin  80 mg Per Tube QHS  . budesonide (PULMICORT) nebulizer solution  0.5 mg Nebulization BID  . chlorhexidine gluconate (MEDLINE KIT)  15 mL Mouth Rinse BID  . Chlorhexidine Gluconate Cloth  6 each Topical Q0600  . feeding supplement (PROSource TF)  45 mL Per Tube TID  . insulin aspart  0-15 Units Subcutaneous Q4H  . insulin glargine  15 Units Subcutaneous Daily  . ipratropium-albuterol  3 mL Nebulization TID  .  mouth rinse  15 mL Mouth Rinse 10 times per day  . melatonin  3 mg Per Tube QHS  . multivitamin with minerals  1 tablet Per Tube Daily  . pantoprazole sodium  40 mg Per Tube QHS  . QUEtiapine  25 mg Per Tube QHS  . sennosides  10 mL Per Tube QHS   sodium chloride, acetaminophen **OR** acetaminophen, fentaNYL (SUBLIMAZE) injection,  fentaNYL (SUBLIMAZE) injection, haloperidol lactate, heparin, hydrALAZINE, ipratropium-albuterol, metoprolol tartrate, ondansetron (ZOFRAN) IV, phenol, polyethylene glycol  Assessment/ Plan:  1. Severe nonoliguric AKI on CKD 3: Likely due to cardiorenal syndrome.  Renal ultrasound without obstruction.  Complicated hospital course with fluctuation in serum creatinine level.  Started CRRT on 8/26 for significant volume overload and uremia.    Continues to tolerate 50-100 cc an hour   2.Anticoagulation: Continues to use apixaban 5 mg twice daily  3.HTN/Volume Status: Continue ultrafiltration 50-100 cc an hour  4.Acute hypoxic respiratory failure/ventilatory dependence: Management per primary team.  He has tracheostomy and on vent.  5.Acute pulmonary edema/CHF: Ultrafiltration with dialysis as discussed above.[name]  6.Anemia multifactorial including kidney disease, chronic illness: Continue to monitor.Ferritin 325. Iron saturation of 12. Feraheme x2 doses.  Monitor hemoglobin. Received Aranesp on 8/27.  7. Hyponatremia hypervolemic: Managing with dialysis.  8.  Hypophosphatemia will replete    LOS: [place] @TODAY @7 :54 AM

## 2020-01-02 ENCOUNTER — Inpatient Hospital Stay (HOSPITAL_COMMUNITY): Payer: Medicare HMO

## 2020-01-02 LAB — RENAL FUNCTION PANEL
Albumin: 3 g/dL — ABNORMAL LOW (ref 3.5–5.0)
Albumin: 3.5 g/dL (ref 3.5–5.0)
Anion gap: 15 (ref 5–15)
Anion gap: 16 — ABNORMAL HIGH (ref 5–15)
BUN: 21 mg/dL (ref 8–23)
BUN: 24 mg/dL — ABNORMAL HIGH (ref 8–23)
CO2: 24 mmol/L (ref 22–32)
CO2: 25 mmol/L (ref 22–32)
Calcium: 9 mg/dL (ref 8.9–10.3)
Calcium: 10 mg/dL (ref 8.9–10.3)
Chloride: 96 mmol/L — ABNORMAL LOW (ref 98–111)
Chloride: 97 mmol/L — ABNORMAL LOW (ref 98–111)
Creatinine, Ser: 1 mg/dL (ref 0.61–1.24)
Creatinine, Ser: 1.07 mg/dL (ref 0.61–1.24)
GFR calc Af Amer: >60 mL/min (ref >60)
GFR calc Af Amer: >60 mL/min (ref >60)
GFR calc non Af Amer: >60 mL/min (ref >60)
GFR calc non Af Amer: >60 mL/min (ref >60)
Glucose, Bld: 224 mg/dL — ABNORMAL HIGH (ref 70–99)
Glucose, Bld: 205 mg/dL — ABNORMAL HIGH (ref 70–99)
Phosphorus: 2.8 mg/dL (ref 2.5–4.6)
Phosphorus: 2 mg/dL — ABNORMAL LOW (ref 2.5–4.6)
Potassium: 4.2 mmol/L (ref 3.5–5.1)
Potassium: 4.6 mmol/L (ref 3.5–5.1)
Sodium: 135 mmol/L (ref 135–145)
Sodium: 138 mmol/L (ref 135–145)

## 2020-01-02 LAB — GLUCOSE, CAPILLARY
Glucose-Capillary: 196 mg/dL — ABNORMAL HIGH (ref 70–99)
Glucose-Capillary: 184 mg/dL — ABNORMAL HIGH (ref 70–99)
Glucose-Capillary: 212 mg/dL — ABNORMAL HIGH (ref 70–99)
Glucose-Capillary: 170 mg/dL — ABNORMAL HIGH (ref 70–99)
Glucose-Capillary: 200 mg/dL — ABNORMAL HIGH (ref 70–99)
Glucose-Capillary: 173 mg/dL — ABNORMAL HIGH (ref 70–99)

## 2020-01-02 LAB — CBC
HCT: 27.1 % — ABNORMAL LOW (ref 39.0–52.0)
Hemoglobin: 8.8 g/dL — ABNORMAL LOW (ref 13.0–17.0)
MCH: 31.3 pg (ref 26.0–34.0)
MCHC: 32.5 g/dL (ref 30.0–36.0)
MCV: 96.4 fL (ref 80.0–100.0)
Platelets: 321 10*3/uL (ref 150–400)
RBC: 2.81 MIL/uL — ABNORMAL LOW (ref 4.22–5.81)
RDW: 18.2 % — ABNORMAL HIGH (ref 11.5–15.5)
WBC: 20.8 10*3/uL — ABNORMAL HIGH (ref 4.0–10.5)
nRBC: 0.3 % — ABNORMAL HIGH (ref 0.0–0.2)

## 2020-01-02 LAB — BPAM RBC
Blood Product Expiration Date: 202109062359
ISSUE DATE / TIME: 202108311546
Unit Type and Rh: 6200

## 2020-01-02 LAB — TYPE AND SCREEN
ABO/RH(D): A POS
Antibody Screen: NEGATIVE
Unit division: 0

## 2020-01-02 LAB — MAGNESIUM: Magnesium: 2.7 mg/dL — ABNORMAL HIGH (ref 1.7–2.4)

## 2020-01-02 MED ORDER — JEVITY 1.2 CAL PO LIQD
1000.0000 mL | ORAL | Status: DC
  Administered 2020-01-03 (×2): 1000 mL
  Filled 2020-01-02 (×3): qty 1000

## 2020-01-02 NOTE — Progress Notes (Signed)
NAME:  Kelly Williams, MRN:  [id number], DOB:  Jul 12, 1940, LOS: 74 ADMISSION DATE:  12/11/2019, CONSULTATION DATE:  12/12/2019 REFERRING MD:  Oneida Alar, CHIEF COMPLAINT:  Vent management for acute respiratory failure   Brief History   79 year old male with history of recurrent R ICA occlusion post-CEA who developed respiratory failure requiring reintubation and pneumonia. He initially failed self extubation 8/5 after 24 hours off vent, and was again extubated 8/7 with seemingly good toleration. Then in the early am hours of 8/11, several hours after self-removal of Cortrak, he became unresponsive and hypoxemic requiring reintubation and transfer to ICU.  Patient is awaiting trach placement.  Past Medical History   Past Medical History:  Diagnosis Date  . Alcohol abuse   . Alcohol abuse   . Arthritis   . Carotid arterial disease ([place])   . Chronic kidney disease    CKD-patient denies  . COPD (chronic obstructive pulmonary disease) (HCC)    uses inhalers  . Coronary artery disease   . Diabetes mellitus without complication ([place])   . Diabetic retinopathy ([place])   . Hyperlipidemia   . Hypertension   . Normocytic anemia 01/16/2019  . Stroke [facility])    around 2020, 2 after bypass in 2015   Significant Hospital Events   8/3: admitted to [facility], CEA, PCCM consulted for vent management 8/4: self extubated 8/5: Re-intubated overnight 8/7: Extubated 8/11: Reintubation   Consults:  Neurology, Cardiology.  Procedures:  8/3: ETT out 821 12/08/2019 trach per ENT 8/3: R CEA 8/3: Exploration of R CEA and R ICA thrombectomy 8/3: R art line out 12/27/2019 right subclavian hemodialysis catheter Significant Diagnostic Tests:  Carotid duplex 8/3 > right ICA occlusion CTA Head/Neck 8/3 > No acute intracranial abnormality. Post repeat right carotid endarterectomy. Decreased irregularity of the proximal cervical ICA. 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 Persistent narrowing and irregularity of the distal ICA with increased eccentric intraluminal filling defect, which may reflect unstable/migrated clot. 60-70% stenosis of the proximal left ICA. No proximal intracranial vessel occlusion. Stable findings of multifocal intracranial atherosclerosis. MRI Brain wo Contrast 8/3 > 1. Unchanged appearance of acute right MCA territory infarct. No new site of acute ischemia. 2. Unchanged generalized atrophy and findings of chronic ischemic Microangiopathy. MR Brain wo Contrast 8/6 > Unchanged appearance of a subacute right MCA territory infarct centered within the right frontal operculum as compared to the MRI of 01/01/2020. No significant mass effect or midline shift. Expected evolution of a known subacute infarct within the right parietal lobe. No interval acute infarct is demonstrated. Stable background generalized parenchymal atrophy and chronic ischemic changes with multiple chronic infarcts as detailed. CT Head wo Contrast 8/11 > 1. Stable appearance of subacute and chronic right MCA infarcts. 2. Stable remote infarcts of the high right frontal lobe and left basal ganglia. 3. No acute intracranial abnormality or significant interval change. CXR 8/31> bilateral infiltrates, edema. Slight improvement form prior. Small R pleural effusion  Micro Data:  MRSA 8/3 Negative Respiratory Cx 8/5 > Negative  Blood Cx 8/11 > Negative  Respiratory Cx 8/13 > pansensitive pseudomonas MRSA 8/16 Negative Tracheal aspirate 8/31>>  Antimicrobials:  Cefazolin 8/3 Unasyn 8/5 > 8/12 Zosyn 8/12> 8/16 Ceftazidime 8/16> 8/17 Cefepime 8/31>> abundant GNR >>   Interim history/subjective:   On CVVHD Precedex off Has tolerated some PSV WBC 20.8 Note antibiotics restarted on 8/31 due to thick secretions.  Cultures done and pending  Objective   Blood  pressure (!) 115/53, pulse 90, temperature 97.9 F (36.6 C), temperature source Oral, resp. rate (!)  26, height 5\' 6"  (1.676 m), weight 55.8 kg, SpO2 100 %.    Vent Mode: PSV;CPAP FiO2 (%):  [40 %] 40 % Set Rate:  [18 bmp] 18 bmp Vt Set:  [510 mL] 510 mL PEEP:  [5 cmH20] 5 cmH20 Pressure Support:  [12 cmH20] 12 cmH20 Plateau Pressure:  [19 cmH20-22 cmH20] 19 cmH20   Intake/Output Summary (Last 24 hours) at 01/02/2020 0931 Last data filed at 01/02/2020 0900 Gross per 24 hour  Intake 2353.69 ml  Output 4477 ml  Net -2123.31 ml   Filed Weights   12/20/2019 0500 12/25/19 0500 01/02/20 0500  Weight: 77 kg 77 kg 55.8 kg   Examination: General: Acute and chronically ill-appearing man, ventilated and on CVVHD HEENT: Temporal wasting, trach in place without any stomal abnormality.  Purulent secretions noted Neuro: Awake, eyes open, tracks, intermittently nodding to questions.  Moves his bilateral upper extremities.  Did not follow commands for me (just got fentanyl) CV: Regular, distant, 80s, well-perfused, no edema PULM: Decreased bilateral breath sounds with scattered rhonchi throughout, no wheezing GI: Thin, nondistended, positive bowel sounds GU: WNL Extremities: Lower extremity muscle wasting without any deformity Skin: No rash  Assessment & Plan:   Critically ill due to acute respiratory failure s/p multiple failed extubations, now trach and vent dependent  CXR 8/31 with diffuse bilateral infiltrates/edema (slight improvement from 8/29) Small R pleural effusion  Suspected HAP: 8/31 Increased thick secretions, WBC 19.9, CXR as above  P Continue PSV as he can tolerate.  Goal to get trach collar but thick secretions have been a barrier last 24 to 48 hours.  Trach changed #6 cuffed Shiley on 8/31.  Continue standard trach care Continue empiric cefepime for presumed HAP, respiratory culture with abundant GNR, speciation pending Volume removal with CVVHD as he can tolerate  AKI on CKD Now on CRRT. P On CVVHD and tolerating hemodynamically.  Volume removal as able.  He has been deemed a  poor candidate for long-term hemodialysis.  Will need to discuss goals with nephrology, family regarding duration of CVVHD, amount of volume removal before we discontinue and follow for evidence of innate renal function.  Encephalopathy due to ICU delirium Hx Bipolar disorder Hx EtOH use, variable intake report  Seroquel 50 mg nightly Delirium precautions Precedex is off Continue reorientation, lights on during the day, up during the day as able Multivitamins as ordered  Anemia -critical illness, renal failure, iatrogenic blood loss in setting of labs -Hgb 7 8/31  P Follow CBC Transfusion goal hemoglobin 7.0  Hypophosphatemia Following BMP, replace electrolytes as indicated  Status post CEA revision with generalized deconditioning PT, mobilization  GOC Continue current care, ventilation, CVVHD.  If he is not progressing to vent freedom, showing signs of renal recovery then will need to have frank discussions regarding possible transition to comfort.  Appreciate palliative care input  Best practice:  Diet: tube feeds  Pain/Anxiety/Delirium protocol (if indicated): qHS seroquel. Precedex off VAP protocol (if indicated): per protocol  DVT prophylaxis: SCDs GI prophylaxis: PPI Glucose control: Lantus + SSI Mobility: PT/OT  Code Status: FULL Family Communication: Daughter updated at bedside 8/31 with VVS and palliative care  Disposition: ICU  CRITICAL CARE Performed by: Collene Gobble   Independent critical care time 32 minutes  Baltazar Apo, MD, PhD 01/02/2020, 9:43 AM [facility] [phone] or if no answer [phone]

## 2020-01-02 NOTE — Progress Notes (Signed)
[facility] ROUNDING NOTE   Subjective:   Brief history 79 year old gentleman with history of alcohol use disorder, PAD COPD coronary arteries diabetes mellitus type 2 hyperlipidemia hypertension CVA who underwent endarterectomy complicated by ventilator dependent acute kidney injury.  Renal ultrasound did not reveal any evidence of obstruction.  He started on CRRT 12/27/2019 for significant volume overload and uremia.  He continues to ultrafilter 50-100 cc an hour.  Blood pressure 114/53 pulse 84 temperature 97.8 O2 sats 1% FiO2 40%  Sodium 135 potassium 4.2 chloride 96 CO2 24 BUN 21 creatinine 1.0 glucose 224 phosphorus 2.8 calcium 9 magnesium 2.7 hemoglobin 8.8  Eliquis 5 mg twice daily atorvastatin 80 mg daily, insulin sliding scale, insulin Lantus 15 units daily, melatonin 3 mg daily, multivitamins 1 daily, Protonix 40 mg nightly, Seroquel 25 mg nightly.  Objective:  Vital signs in last 24 hours:  Temp:  [97.8 F (36.6 C)-99 F (37.2 C)] 97.8 F (36.6 C) (09/01 0400) Pulse Rate:  [79-97] 86 (09/01 0700) Resp:  [16-37] 21 (09/01 0700) BP: (83-144)/(46-100) 105/63 (09/01 0700) SpO2:  [93 %-100 %] 100 % (09/01 0700) FiO2 (%):  [40 %] 40 % (09/01 0353) Weight:  [55.8 kg] 55.8 kg (09/01 0500)  Weight change:  Filed Weights   12/23/2019 0500 12/25/19 0500 01/02/20 0500  Weight: 77 kg 77 kg 55.8 kg    Intake/Output: I/O last 3 completed shifts: In: 3139.9 [I.V.:194.1; Blood:315; Other:100; NG/GT:1925; IV Piggyback:605.8] Out: 2111 [Other:5670]   Intake/Output this shift:  No intake/output data recorded.  CVS- RRR RS- CTA ABD- BS present soft non-distended EXT- no edema   Basic Metabolic Panel: Recent Labs  Lab 12/29/19 0348 12/29/19 1616 12/30/19 0400 12/30/19 1242 12/31/19 0706 12/31/19 0706 12/31/19 1603 12/31/19 1603 01/01/20 0544 01/01/20 1830 01/02/20 0051  NA 135   < > 133*   < > 135  --  136  --  136 136 135  K 4.5   < > 5.2*   < > 4.4  --   4.3  --  4.6 4.3 4.2  CL 99   < > 99   < > 97*  --  98  --  98 98 96*  CO2 26   < > 24   < > 24  --  26  --  26 25 24   GLUCOSE 219*   < > 179*   < > 109*  --  157*  --  154* 178* 224*  BUN 49*   < > 27*   < > 22  --  20  --  21 22 21   CREATININE 1.39*   < > 1.10   < > 1.01  --  0.94  --  0.94 0.88 1.00  CALCIUM 8.4*   < > 8.6*   < > 8.8*   < > 8.9   < > 9.2 9.1 9.0  MG 2.7*  --  2.6*  --  2.6*  --   --   --  2.8*  --  2.7*  PHOS 2.3*  2.3*   < > 2.4*   < > 2.4*  --  1.8*  --  1.6* 3.9 2.8   < > = values in this interval not displayed.    Liver Function Tests: Recent Labs  Lab 12/31/19 0706 12/31/19 1603 01/01/20 0544 01/01/20 1830 01/02/20 0051  ALBUMIN 3.0* 3.0* 2.9* 3.1* 3.0*   No results for input(s): LIPASE, AMYLASE in the last 168 hours. No results for input(s): AMMONIA in  the last 168 hours.  CBC: Recent Labs  Lab 12/27/19 1705 12/28/19 0424 01/01/20 0544 01/01/20 2133 01/02/20 0051  WBC  --  15.5* 19.9*  --  20.8*  NEUTROABS  --  13.2*  --   --   --   HGB 9.2* 8.0* 7.0* 8.5* 8.8*  HCT 27.0* 25.8* 23.3* 27.1* 27.1*  MCV  --  95.6 100.9*  --  96.4  PLT  --  419* 306  --  321    Cardiac Enzymes: No results for input(s): CKTOTAL, CKMB, CKMBINDEX, TROPONINI in the last 168 hours.  BNP: Invalid input(s): POCBNP  CBG: Recent Labs  Lab 01/01/20 1521 01/01/20 1922 01/01/20 2313 01/02/20 0318 01/02/20 0713  GLUCAP 165* 168* 201* 196* 184*    Microbiology: Results for orders placed or performed during the hospital encounter of 12/08/2019  Surgical pcr screen     Status: None   Collection Time: 12/12/2019  8:37 AM   Specimen: Vein; Nasal Swab  Result Value Ref Range Status   MRSA, PCR NEGATIVE NEGATIVE Final   Staphylococcus aureus NEGATIVE NEGATIVE Final    Comment: (NOTE) The Xpert SA Assay (FDA approved for NASAL specimens in patients 83 years of age and older), is one component of a comprehensive surveillance program. It is not intended to diagnose  infection nor to guide or monitor treatment. Performed at [facility], [place] [address], [place], [place] [address]   Culture, respiratory (non-expectorated)     Status: None   Collection Time: 12/06/19  9:18 AM   Specimen: Tracheal Aspirate; Respiratory  Result Value Ref Range Status   Specimen Description TRACHEAL ASPIRATE  Final   Special Requests Normal  Final   Gram Stain NO WBC SEEN NO ORGANISMS SEEN   Final   Culture   Final    RARE Normal respiratory flora-no Staph aureus or Pseudomonas seen Performed at [facility], [address] [place], [place] [address]    Report Status 12/08/2019 FINAL  Final  Culture, blood (routine x 2)     Status: None   Collection Time: 12/12/19  7:17 AM   Specimen: BLOOD LEFT ARM  Result Value Ref Range Status   Specimen Description BLOOD LEFT ARM  Final   Special Requests   Final    BOTTLES DRAWN AEROBIC ONLY Blood Culture adequate volume   Culture   Final    NO GROWTH 5 DAYS Performed at [facility], [place] [address], [place], [place] [address]    Report Status 12/17/2019 FINAL  Final  Culture, blood (routine x 2)     Status: None   Collection Time: 12/12/19  7:17 AM   Specimen: BLOOD LEFT ARM  Result Value Ref Range Status   Specimen Description BLOOD LEFT ARM  Final   Special Requests   Final    BOTTLES DRAWN AEROBIC ONLY Blood Culture adequate volume   Culture   Final    NO GROWTH 5 DAYS Performed at [facility], [place] [address], [place], [place] [address]    Report Status 12/17/2019 FINAL  Final  Culture, respiratory (non-expectorated)     Status: None   Collection Time: 12/14/19  2:40 PM   Specimen: Tracheal Aspirate; Respiratory  Result Value Ref Range Status   Specimen Description TRACHEAL ASPIRATE  Final   Special Requests NONE  Final   Gram Stain   Final    FEW WBC PRESENT, PREDOMINANTLY PMN NO ORGANISMS SEEN Performed at [facility], [address], [place], [place] [address]     Culture RARE PSEUDOMONAS AERUGINOSA  Final   Report Status 12/17/2019 FINAL  Final   Organism ID, Bacteria PSEUDOMONAS AERUGINOSA  Final      Susceptibility   Pseudomonas aeruginosa - MIC*    CEFTAZIDIME 2 SENSITIVE Sensitive     CIPROFLOXACIN <=0.25 SENSITIVE Sensitive     GENTAMICIN <=1 SENSITIVE Sensitive     IMIPENEM 2 SENSITIVE Sensitive     PIP/TAZO 8 SENSITIVE Sensitive     CEFEPIME 2 SENSITIVE Sensitive     * RARE PSEUDOMONAS AERUGINOSA  Surgical PCR screen     Status: None   Collection Time: 12/17/19  9:29 AM   Specimen: Nasal Mucosa; Nasal Swab  Result Value Ref Range Status   MRSA, PCR NEGATIVE NEGATIVE Final   Staphylococcus aureus NEGATIVE NEGATIVE Final    Comment: (NOTE) The Xpert SA Assay (FDA approved for NASAL specimens in patients 67 years of age and older), is one component of a comprehensive surveillance program. It is not intended to diagnose infection nor to guide or monitor treatment. Performed at [facility], [place] [address] [place], [place] [address]   Culture, respiratory (non-expectorated)     Status: None (Preliminary result)   Collection Time: 01/01/20  9:32 AM   Specimen: Tracheal Aspirate; Respiratory  Result Value Ref Range Status   Specimen Description TRACHEAL ASPIRATE  Final   Special Requests NONE  Final   Gram Stain   Final    MODERATE WBC PRESENT, PREDOMINANTLY PMN ABUNDANT GRAM NEGATIVE RODS ABUNDANT GRAM POSITIVE COCCI FEW YEAST Performed at [facility], [place] [address] [place], [place] [address]    Culture PENDING  Incomplete   Report Status PENDING  Incomplete    Coagulation Studies: No results for input(s): LABPROT, INR in the last 72 hours.  Urinalysis: No results for input(s): COLORURINE, LABSPEC, PHURINE, GLUCOSEU, HGBUR, BILIRUBINUR, KETONESUR, PROTEINUR, UROBILINOGEN, NITRITE, LEUKOCYTESUR in the last 72 hours.  Invalid input(s): APPERANCEUR    Imaging: DG CHEST PORT 1 VIEW  Result Date:  01/02/2020 CLINICAL DATA:  Pulmonary edema. EXAM: PORTABLE CHEST 1 VIEW COMPARISON:  01/01/2020. FINDINGS: 0517 hours. Tracheostomy tube again noted. A feeding tube passes into the stomach although the distal tip position is not included on the film. Right subclavian central line tip overlies the region of the innominate vein confluence. Stable asymmetric elevation right hemidiaphragm with right base streaky opacity suggesting atelectasis and tiny right effusion. Interstitial markings are diffusely coarsened with chronic features. Cardiopericardial silhouette is at upper limits of normal for size. Telemetry leads overlie the chest. IMPRESSION: Cardiomegaly with diffuse interstitial opacity slightly improved in the interval, potentially reflecting decreasing pulmonary edema. Asymmetric elevation right hemidiaphragm with right base atelectasis and tiny right effusion. Electronically Signed   By: Misty Stanley M.D.   On: 01/02/2020 07:20   DG CHEST PORT 1 VIEW  Result Date: 01/01/2020 CLINICAL DATA:  Pulmonary edema. EXAM: PORTABLE CHEST 1 VIEW COMPARISON:  12/30/2019. FINDINGS: Tracheostomy tube, feeding tube, right subclavian line in stable position. Prior CABG. Heart size stable. Diffuse bilateral pulmonary infiltrates/edema with slight improvement from prior exam. Small right pleural effusion again noted. No pneumothorax. Right shoulder replacement. IMPRESSION: 1.  Lines and tubes stable position. 2.  Prior CABG.  Heart size stable. 3. Diffuse bilateral pulmonary infiltrates/edema with slight improvement from prior exam. Small right pleural effusion again noted. Electronically Signed   By: Marcello Moores  Register   On: 01/01/2020 05:37  Medications:   . sodium chloride Stopped (01/01/20 1805)  . ceFEPime (MAXIPIME) IV Stopped (01/01/20 2159)  . feeding supplement (JEVITY 1.2 CAL) 1,000 mL (01/01/20 0933)  . prismasol BGK 2/2.5 replacement solution 500 mL/hr at 01/01/20 1453  . prismasol BGK 2/2.5  replacement solution 300 mL/hr at 01/01/20 1930  . prismasol BGK 4/2.5 1,500 mL/hr at 01/02/20 0544   . sodium chloride   Intravenous Once  . apixaban  5 mg Per Tube BID  . atorvastatin  80 mg Per Tube QHS  . budesonide (PULMICORT) nebulizer solution  0.5 mg Nebulization BID  . chlorhexidine gluconate (MEDLINE KIT)  15 mL Mouth Rinse BID  . Chlorhexidine Gluconate Cloth  6 each Topical Q0600  . feeding supplement (PROSource TF)  45 mL Per Tube TID  . folic acid  1 mg Per Tube Daily  . insulin aspart  0-15 Units Subcutaneous Q4H  . insulin glargine  15 Units Subcutaneous Daily  . ipratropium-albuterol  3 mL Nebulization TID  . mouth rinse  15 mL Mouth Rinse 10 times per day  . melatonin  3 mg Per Tube QHS  . multivitamin with minerals  1 tablet Per Tube Daily  . pantoprazole sodium  40 mg Per Tube QHS  . QUEtiapine  50 mg Per Tube QHS  . sennosides  10 mL Per Tube QHS  . thiamine  100 mg Per Tube Daily   Or  . thiamine  100 mg Intravenous Daily   sodium chloride, acetaminophen **OR** acetaminophen, fentaNYL (SUBLIMAZE) injection, fentaNYL (SUBLIMAZE) injection, haloperidol lactate, heparin, hydrALAZINE, ipratropium-albuterol, metoprolol tartrate, ondansetron (ZOFRAN) IV, phenol, polyethylene glycol  Assessment/ Plan:  1. Severe nonoliguric AKI on CKD 3: Likely due to cardiorenal syndrome.  Renal ultrasound without obstruction.  Complicated hospital course with fluctuation in serum creatinine level.  Started CRRT on 8/26 for significant volume overload and uremia.    Continues to tolerate 50-100 cc an hour   2.Anticoagulation: Continues to use apixaban 5 mg twice daily  3.HTN/Volume Status: Continue ultrafiltration 50-100 cc an hour  4.Acute hypoxic respiratory failure/ventilatory dependence: Management per primary team.  He has tracheostomy and on vent.  5.Acute pulmonary edema/CHF: Ultrafiltration with dialysis as discussed above.[name]  6.Anemia multifactorial including  kidney disease, chronic illness: Continue to monitor.Ferritin 325. Iron saturation of 12. Feraheme x2 doses.  Monitor hemoglobin. Received Aranesp on 8/27.  7. Hyponatremia hypervolemic: Managing with dialysis.  8.  Hypophosphatemia repleted    LOS: [place] @TODAY @7 :30 AM

## 2020-01-02 NOTE — Progress Notes (Addendum)
  Progress Note    01/02/2020 7:55 AM 11 Days Post-Op  Subjective:  Precedex turned off last night.  Patient alert this morning   Vitals:   01/02/20 0600 01/02/20 0700  BP: (!) 112/58 105/63  Pulse: 86 86  Resp: 16 (!) 21  Temp:    SpO2: 100% 100%   Physical Exam: Lungs:  Mechanical ventilation Incisions:  R neck incision healed Extremities: peripheral pulses intact Neurologic: alert, following commands, moving all extremities well  CBC    Component Value Date/Time   WBC 20.8 (H) 01/02/2020 0051   RBC 2.81 (L) 01/02/2020 0051   HGB 8.8 (L) 01/02/2020 0051   HCT 27.1 (L) 01/02/2020 0051   PLT 321 01/02/2020 0051   MCV 96.4 01/02/2020 0051   MCH 31.3 01/02/2020 0051   MCHC 32.5 01/02/2020 0051   RDW 18.2 (H) 01/02/2020 0051   LYMPHSABS 0.8 12/28/2019 0424   MONOABS 1.2 (H) 12/28/2019 0424   EOSABS 0.2 12/28/2019 0424   BASOSABS 0.1 12/28/2019 0424    BMET    Component Value Date/Time   NA 135 01/02/2020 0051   K 4.2 01/02/2020 0051   CL 96 (L) 01/02/2020 0051   CO2 24 01/02/2020 0051   GLUCOSE 224 (H) 01/02/2020 0051   BUN 21 01/02/2020 0051   CREATININE 1.00 01/02/2020 0051   CREATININE 1.34 (H) 08/24/2019 1114   CALCIUM 9.0 01/02/2020 0051   GFRNONAA >60 01/02/2020 0051   GFRNONAA 50 (L) 08/24/2019 1114   GFRAA >60 01/02/2020 0051   GFRAA 58 (L) 08/24/2019 1114    INR    Component Value Date/Time   INR 1.2 12/11/2019 0453     Intake/Output Summary (Last 24 hours) at 01/02/2020 0755 Last data filed at 01/02/2020 0700 Gross per 24 hour  Intake 2424.23 ml  Output 4299 ml  Net -1874.77 ml     Assessment/Plan:  79 y.o. male is s/p redo R CEA with post op respiratory and renal failure 11 Days Post-Op   Precedex turned off, patient alert this morning and following commands CCM attempting to wean mechanical ventilation Nephrology managing CRRT   [name], PA-C [facility] [phone] 01/02/2020 7:55 AM   Agree with  above.  Patient is much more interactive this morning.  He was able to mouth that he is at [facility] but did not remember his operation.  Hopefully he will wean off the ventilator without sedation.  He is still on CRRT.  I am not sure of nephrology's current plan to see if he will have another trial of weaning from dialysis.  We will leave this at their discretion.  Daughter updated by phone  Ruta Hinds, MD [facility] [place] Office: [phone]

## 2020-01-02 NOTE — Progress Notes (Signed)
Nutrition Follow-up  DOCUMENTATION CODES:   Severe malnutrition in context of chronic illness  INTERVENTION:   Tube feeding via Cortrak: Increase Jevity 1.2 to 60 ml/h (1440 ml per day) Prosource TF 45 ml TID  Provides 1848 kcal, 112 gm protein, 1167 ml free water daily   NUTRITION DIAGNOSIS:   Severe Malnutrition related to chronic illness (COPD, DM, ETOH abuse) as evidenced by severe fat depletion, severe muscle depletion.  Ongoing  GOAL:   Patient will meet greater than or equal to 90% of their needs  Met with TF  MONITOR:   Diet advancement, TF tolerance  REASON FOR ASSESSMENT:   Consult, Ventilator Enteral/tube feeding initiation and management  ASSESSMENT:   Pt with PMH of DM, HTN, ETOH abuse, COPD, HLD, and CKD admitted for planned redo R carotid endarterectomy then after neuro change s/p stat exploration of R CEA and R ICA thrombectomy with no intervention. Hospital course complicated by acute respiratory failure 2/2 aspiration PNA.   Pt discussed during ICU rounds and with RN.  Pt started CVVHD 8/26 Palliative meeting today; per notes continue full care except plan to stop CVVHD per renal and will not restart iHD unless pt has significant improvement.   8/3 admit 8/4 self-extubated 8/5 re-intubated 8/6 cortrak placed  8/7 extubated 8/11 re-intubated; cortrak replaced (gastric) 8/21 trach placed   Patient is currently intubated on ventilator support MV: 6.9 L/min Temp (24hrs), Avg:98.3 F (36.8 C), Min:97.8 F (36.6 C), Max:99 F (37.2 C)  Labs: PO4: 7.7, BUN: 162 H, Cr: 3.63 H  CBG's: 170-212 Medications: Novolog, folic acid, Lantus, MVI, Protonix, senokot, thiamine   I&O: - 14 L  UOP: 0 UF: 4299 ml   Current TF:  Jevity 1.2 at 55 ml/h  Prosource TF 45 ml TID Provides 1704 kcal, 106 gm protein   Diet Order:   Diet Order            Diet NPO time specified  Diet effective midnight                 EDUCATION NEEDS:   No  education needs have been identified at this time  Skin:  Skin Assessment: Skin Integrity Issues: Skin Integrity Issues:: Incisions Incisions: R neck  Last BM:  8/27  Height:   Ht Readings from Last 1 Encounters:  12/30/2019 5' 6" (1.676 m)    Weight:   Wt Readings from Last 1 Encounters:  01/02/20 55.8 kg    Ideal Body Weight:  64.5 kg  BMI:  Body mass index is 19.86 kg/m.  Estimated Nutritional Needs:   Kcal:  1600-1800  Protein:  95-115 grams  Fluid:  > 1.8 L/day  Lockie Pares., RD, LDN, CNSC See AMiON for contact information

## 2020-01-02 NOTE — Progress Notes (Signed)
Palliative:  HPI:79 y.o.malewith past medical history of stroke, hypertension, hyperlipidemia, diabetic retinopathy, DM, CAD, s/p CABG, COPD, CKD, arthritis, ETOH useadmitted on 8/3/2021with history of recurrent right ICA occlusion s/p CEA. Developed respiratory failure requiring intubation. Patient self-extubated 8/5, again extubated 8/7. On 8/11 AM, patient became unresponsive and hypoxemic requiring re-intubation and transfer to ICU. Trach placed 8/21. PT/OT following. Likely will need LTACH placement. Patient now with worsening AKI on CKD, possibly cardiorenal syndrome. Nephrology consulted. Patient receiving high-dose IV diuretics but worsening creatinine. May requiring dialysis and poor candidate for long-term dialysis with underlying chronic conditions and current condition with tracheostomy/vent dependency. Palliative medicine consultation for goals of care.  Mr. Parcel continues on vent (currently weaning) and CRRT. He is more interactive and following commands appropriately today. He is less agitated and restless today and lying in bed and appears comfortable. He denies pain or difficulty breathing with head nod no. I discussed with Dr. Lamonte Sakai.   I called and spoke with daughter, [name]. I explained the above assessment but that I do not feel that Mr. Kozub is in a place to be able to make decisions for himself. We will continue attempts at trach collar. We also discussed plan with CRRT and that this is a temporary measure. [name] understands and we discussed that when the time to stop CRRT how we would proceed if his kidneys continue to fail without dialysis. We did conclude that he is a poor dialysis candidate and that he is unlikely to desire long term HD anyway. We decided that we would not start back dialysis once he is optimized and given trial off CRRT. If his kidneys continue to fail at that time we would proceed with more comfort focused care and consider hospice support. [name]  is understandably tearful. She understands that his prognosis is very guarded and his current issues are more likely becoming chronic issues for him that comes with very poor quality of life. I reassured her that we will continue efforts and no changes to be made today. [name] is off work on Friday and plans to be at the hospital then. I will continue to update and speak with her daily (she is more available ~1230 during her work break).   All questions/concerns addressed. Emotional support provided. Updated Dr. Lamonte Sakai and Dr. Justin Mend.   Exam: Alert, tracking, following commands. Less restless and resting comfortably in bed. Weaning on vent via trach. Abd soft, flat. Moves all extremities.   Plan: - Continue full support.  - Plans for d/c CRRT when optimized and renal recommends. Family hoping for full support to continue through next week but open to medical recommendations. No plans to restart dialysis once stopped (unless he has significant improvement which at this stage is unlikely).  - I will continue to follow and support.   Leake, NP Palliative Medicine Team Pager [phone] (Please see [email or web address] for schedule) Team Phone [phone]    Greater than 50%  of this time was spent counseling and coordinating care related to the above assessment and plan

## 2020-01-02 DEATH — deceased

## 2020-01-03 ENCOUNTER — Inpatient Hospital Stay (HOSPITAL_COMMUNITY): Payer: Medicare HMO

## 2020-01-03 LAB — RENAL FUNCTION PANEL
Albumin: 3.5 g/dL (ref 3.5–5.0)
Albumin: 3.3 g/dL — ABNORMAL LOW (ref 3.5–5.0)
Anion gap: 14 (ref 5–15)
Anion gap: 17 — ABNORMAL HIGH (ref 5–15)
BUN: 25 mg/dL — ABNORMAL HIGH (ref 8–23)
BUN: 51 mg/dL — ABNORMAL HIGH (ref 8–23)
CO2: 24 mmol/L (ref 22–32)
CO2: 19 mmol/L — ABNORMAL LOW (ref 22–32)
Calcium: 9.7 mg/dL (ref 8.9–10.3)
Calcium: 9.3 mg/dL (ref 8.9–10.3)
Chloride: 95 mmol/L — ABNORMAL LOW (ref 98–111)
Chloride: 95 mmol/L — ABNORMAL LOW (ref 98–111)
Creatinine, Ser: 1.09 mg/dL (ref 0.61–1.24)
Creatinine, Ser: 1.72 mg/dL — ABNORMAL HIGH (ref 0.61–1.24)
GFR calc Af Amer: >60 mL/min (ref >60)
GFR calc Af Amer: 43 mL/min — ABNORMAL LOW (ref >60)
GFR calc non Af Amer: >60 mL/min (ref >60)
GFR calc non Af Amer: 37 mL/min — ABNORMAL LOW (ref >60)
Glucose, Bld: 239 mg/dL — ABNORMAL HIGH (ref 70–99)
Glucose, Bld: 429 mg/dL — ABNORMAL HIGH (ref 70–99)
Phosphorus: 1.9 mg/dL — ABNORMAL LOW (ref 2.5–4.6)
Phosphorus: 5.9 mg/dL — ABNORMAL HIGH (ref 2.5–4.6)
Potassium: 4.4 mmol/L (ref 3.5–5.1)
Potassium: 4.9 mmol/L (ref 3.5–5.1)
Sodium: 133 mmol/L — ABNORMAL LOW (ref 135–145)
Sodium: 131 mmol/L — ABNORMAL LOW (ref 135–145)

## 2020-01-03 LAB — GLUCOSE, CAPILLARY
Glucose-Capillary: 196 mg/dL — ABNORMAL HIGH (ref 70–99)
Glucose-Capillary: 211 mg/dL — ABNORMAL HIGH (ref 70–99)
Glucose-Capillary: 238 mg/dL — ABNORMAL HIGH (ref 70–99)
Glucose-Capillary: 292 mg/dL — ABNORMAL HIGH (ref 70–99)
Glucose-Capillary: 359 mg/dL — ABNORMAL HIGH (ref 70–99)
Glucose-Capillary: 376 mg/dL — ABNORMAL HIGH (ref 70–99)
Glucose-Capillary: 391 mg/dL — ABNORMAL HIGH (ref 70–99)
Glucose-Capillary: 50 mg/dL — ABNORMAL LOW (ref 70–99)
Glucose-Capillary: 71 mg/dL (ref 70–99)
Glucose-Capillary: 74 mg/dL (ref 70–99)

## 2020-01-03 LAB — MAGNESIUM: Magnesium: 3 mg/dL — ABNORMAL HIGH (ref 1.7–2.4)

## 2020-01-03 LAB — CBC
HCT: 34.6 % — ABNORMAL LOW (ref 39.0–52.0)
Hemoglobin: 10.9 g/dL — ABNORMAL LOW (ref 13.0–17.0)
MCH: 31.3 pg (ref 26.0–34.0)
MCHC: 31.5 g/dL (ref 30.0–36.0)
MCV: 99.4 fL (ref 80.0–100.0)
Platelets: 361 10*3/uL (ref 150–400)
RBC: 3.48 MIL/uL — ABNORMAL LOW (ref 4.22–5.81)
RDW: 20.6 % — ABNORMAL HIGH (ref 11.5–15.5)
WBC: 16.1 10*3/uL — ABNORMAL HIGH (ref 4.0–10.5)
nRBC: 0.5 % — ABNORMAL HIGH (ref 0.0–0.2)

## 2020-01-03 MED ORDER — DEXTROSE 50 % IV SOLN
INTRAVENOUS | Status: AC
  Administered 2020-01-03: 50 mL
  Filled 2020-01-03: qty 50

## 2020-01-03 MED ORDER — MELATONIN 3 MG PO TABS: 3.0000 mg | ORAL_TABLET | Freq: Every day | ORAL | Status: DC

## 2020-01-03 MED ORDER — MELATONIN 3 MG PO TABS
3.0000 mg | ORAL_TABLET | Freq: Every day | ORAL | Status: DC
  Administered 2020-01-03: 3 mg
  Filled 2020-01-03: qty 1

## 2020-01-03 MED ORDER — SODIUM CHLORIDE 0.9 % IV SOLN
1.0000 g | INTRAVENOUS | Status: DC
  Administered 2020-01-04: 1 g via INTRAVENOUS
  Filled 2020-01-03: qty 1

## 2020-01-03 MED ORDER — SODIUM PHOSPHATES 45 MMOLE/15ML IV SOLN
30.0000 mmol | Freq: Once | INTRAVENOUS | Status: AC
  Administered 2020-01-03: 30 mmol via INTRAVENOUS
  Filled 2020-01-03: qty 10

## 2020-01-03 NOTE — Progress Notes (Signed)
Pharmacy Antibiotic Note  Kelly Williams is a 79 y.o. male admitted on 12/21/2019 with concern for new pneumonia.  Pharmacy has been consulted for Cefepime dosing.   CRRT stopped today at noon. Last Cefepime dose given this AM at 0900. Pt remains anuric, evening labs show SCr up to 1.72 - likely will continue to climb.   Plan: - Adjust Cefepime to 1g IV every 24 hours - Will continue to follow renal function, culture results, LOT, and antibiotic de-escalation plans   Height: 5\' 6"  (167.6 cm) Weight: 55.8 kg (123 lb 0.3 oz) IBW/kg (Calculated) : 63.8  Temp (24hrs), Avg:98.6 F (37 C), Min:97.5 F (36.4 C), Max:99.6 F (37.6 C)  Recent Labs  Lab 12/28/19 0424 12/28/19 1609 01/01/20 0544 01/01/20 0544 01/01/20 1830 01/02/20 0051 01/02/20 1704 01/03/20 0433 01/03/20 1844  WBC 15.5*  --  19.9*  --   --  20.8*  --  16.1*  --   CREATININE 2.44*   < > 0.94   < > 0.88 1.00 1.07 1.09 1.72*   < > = values in this interval not displayed.    Estimated Creatinine Clearance: 27.5 mL/min (A) (by C-G formula based on SCr of 1.72 mg/dL (H)).    Allergies  Allergen Reactions  . Metformin And Related Diarrhea    Unasyn 8/5 > 8/12 (rising Na, didn't want CTX/Flagyl) Zosyn 8/12 >> 8/16 Ceftaz 8/16 > 8/17 Cefepime 8/31 >>   8/3 MRSA PCR - negative 8/5 TA - negative 8/11 BCx - NegF  8/13 TA - rare Pseudomonas (pan-S)  8/16 surgical PCR - negative  Thank you for allowing pharmacy to be a part of this patient's care.  Alycia Rossetti, PharmD, BCPS Clinical Pharmacist Clinical phone for 01/03/2020: [phone] 01/03/2020 8:00 PM   **Pharmacist phone directory can now be found on [email or web address] ([email or web address]).  Listed under [place].

## 2020-01-03 NOTE — Progress Notes (Signed)
[facility]  Subjective  - awake but not as alert   Objective (!) 95/55 93 99.6 F (37.6 C) (Axillary) (!) 23 98%  Intake/Output Summary (Last 24 hours) at 01/03/2020 0816 Last data filed at 01/03/2020 0700 Gross per 24 hour  Intake 1873.75 ml  Output 4632 ml  Net -2758.25 ml   Follows commands   Assessment/Planning: 1. VDRF d/w Dr Lamonte Sakai trach collar today to assess potential for wean  2.  Acute on chronic renal failure even today per renal. At some point will need to decide on stopping CRRT to see if his kidneys have any hope of recovery  3. S/p redo right CEA complicated by post op swallowing difficulties, incision healed, will reassess if he is ever able to get free of vent  4. Pneumonia leukocytosis improved post antibiotics  Ongoing palliative care discussions with daughter  Ruta Hinds 01/03/2020 8:16 AM --  Laboratory Lab Results: Recent Labs    01/02/20 0051 01/03/20 0433  WBC 20.8* 16.1*  HGB 8.8* 10.9*  HCT 27.1* 34.6*  PLT 321 361   BMET Recent Labs    01/02/20 1704 01/03/20 0433  NA 138 133*  K 4.6 4.4  CL 97* 95*  CO2 25 24  GLUCOSE 205* 239*  BUN 24* 25*  CREATININE 1.07 1.09  CALCIUM 10.0 9.7    COAG Lab Results  Component Value Date   INR 1.2 12/10/2019   INR 1.2 12/17/2019   INR 1.0 12/21/2019   No results found for: PTT

## 2020-01-03 NOTE — Progress Notes (Signed)
NAME:  Kelly Williams, MRN:  [id number], DOB:  1941/01/21, LOS: 53 ADMISSION DATE:  12/03/2019, CONSULTATION DATE:  12/12/2019 REFERRING MD:  Oneida Alar, CHIEF COMPLAINT:  Vent management for acute respiratory failure   Brief History   79 year old male with history of recurrent R ICA occlusion post-CEA who developed respiratory failure requiring reintubation and pneumonia. He initially failed self extubation 8/5 after 24 hours off vent, and was again extubated 8/7 with seemingly good toleration. Then in the early am hours of 8/11, several hours after self-removal of Cortrak, he became unresponsive and hypoxemic requiring reintubation and transfer to ICU.  Patient is awaiting trach placement.  Past Medical History   Past Medical History:  Diagnosis Date  . Alcohol abuse   . Alcohol abuse   . Arthritis   . Carotid arterial disease ([place])   . Chronic kidney disease    CKD-patient denies  . COPD (chronic obstructive pulmonary disease) ([facility])    uses inhalers  . Coronary artery disease   . Diabetes mellitus without complication ([place])   . Diabetic retinopathy ([place])   . Hyperlipidemia   . Hypertension   . Normocytic anemia 01/16/2019  . Stroke [facility])    around 2020, 2 after bypass in 2015   Significant Hospital Events   8/3: admitted to [facility], CEA, PCCM consulted for vent management 8/4: self extubated 8/5: Re-intubated overnight 8/7: Extubated 8/11: Reintubation   Consults:  Neurology, Cardiology.  Procedures:  8/3: ETT out 821 12/10/2019 trach per ENT 8/3: R CEA 8/3: Exploration of R CEA and R ICA thrombectomy 8/3: R art line out 12/27/2019 right subclavian hemodialysis catheter Significant Diagnostic Tests:  Carotid duplex 8/3 > right ICA occlusion CTA Head/Neck 8/3 > No acute intracranial abnormality. Post repeat right carotid endarterectomy. Decreased irregularity of the proximal cervical ICA. 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 Persistent narrowing and irregularity of the distal ICA with increased eccentric intraluminal filling defect, which may reflect unstable/migrated clot. 60-70% stenosis of the proximal left ICA. No proximal intracranial vessel occlusion. Stable findings of multifocal intracranial atherosclerosis. MRI Brain wo Contrast 8/3 > 1. Unchanged appearance of acute right MCA territory infarct. No new site of acute ischemia. 2. Unchanged generalized atrophy and findings of chronic ischemic Microangiopathy. MR Brain wo Contrast 8/6 > Unchanged appearance of a subacute right MCA territory infarct centered within the right frontal operculum as compared to the MRI of 12/06/2019. No significant mass effect or midline shift. Expected evolution of a known subacute infarct within the right parietal lobe. No interval acute infarct is demonstrated. Stable background generalized parenchymal atrophy and chronic ischemic changes with multiple chronic infarcts as detailed. CT Head wo Contrast 8/11 > 1. Stable appearance of subacute and chronic right MCA infarcts. 2. Stable remote infarcts of the high right frontal lobe and left basal ganglia. 3. No acute intracranial abnormality or significant interval change. CXR 8/31> bilateral infiltrates, edema. Slight improvement form prior. Small R pleural effusion  Micro Data:  MRSA 8/3 Negative Respiratory Cx 8/5 > Negative  Blood Cx 8/11 > Negative  Respiratory Cx 8/13 > pansensitive pseudomonas MRSA 8/16 Negative Tracheal aspirate 8/31>> abundant GNR >>   Antimicrobials:  Cefazolin 8/3 Unasyn 8/5 > 8/12 Zosyn 8/12> 8/16 Ceftazidime 8/16> 8/17 Cefepime 8/31>>   Interim history/subjective:   Remains on CVVH overnight, I/O- 1.1 L Did PSV yesterday 9/1  Objective   Blood pressure (!) 95/55, pulse 93, temperature 99.6 F (37.6 C), temperature source  Axillary, resp. rate (!) 23, height 5\' 6"  (1.676 m), weight 55.8 kg, SpO2 100 %.    Vent Mode:  PRVC FiO2 (%):  [40 %] 40 % Set Rate:  [18 bmp] 18 bmp Vt Set:  [510 mL] 510 mL PEEP:  [5 cmH20] 5 cmH20 Pressure Support:  [12 cmH20] 12 cmH20 Plateau Pressure:  [18 cmH20-20 cmH20] 20 cmH20   Intake/Output Summary (Last 24 hours) at 01/03/2020 0751 Last data filed at 01/03/2020 0700 Gross per 24 hour  Intake 1928.75 ml  Output 4811 ml  Net -2882.25 ml   Filed Weights   12/29/2019 0500 12/25/19 0500 01/02/20 0500  Weight: 77 kg 77 kg 55.8 kg   Examination: General: Acute and chronically ill-appearing man, ventilated, globally weak on CVVHD HEENT: Trach in place, temporal wasting, stoma clean and dry, no secretions today Neuro: Awake eyes open, tracks, nods to questions, moves extremities but globally weak CV: Regular, distant, well-perfused, no edema PULM: Decreased bilateral breath sounds, scattered rhonchi, no crackles or wheezes GI: Nondistended, positive bowel sounds GU: WNL Extremities: Thin, no edema Skin: No rash  Assessment & Plan:   Critically ill due to acute respiratory failure s/p multiple failed extubations, now trach and vent dependent  CXR 8/31 with diffuse bilateral infiltrates/edema (slight improvement from 8/29) Small R pleural effusion  Suspected HAP: 8/31 Increased thick secretions, chest x-ray reassuring.  Respiratory culture pending P Has tolerated PSV intermittently.  Goal back to trach collar on 9/2 to see if he can tolerate.  This will help guide overall direction of care.  Lurline Idol was changed to a #6 cuffed Shiley on 8/31.  Continue standard trach care Respiratory culture pending.  Continue empiric cefepime for presumed HAP although chest x-ray reassuring.  Plan to narrow based on culture results, previously colonized with a pansensitive Pseudomonas Continue volume removal as he can tolerate  AKI on CKD Now on CRRT. P Tolerating CVVHD hemodynamically as well as volume removal.  Unclear whether he is a good candidate for long-term intermittent  hemodialysis.  Will need to determine the timing of stopping CVVHD, assessment for possible renal recovery.  This will also shape long-term goals of care, will prompt discussion regarding whether he would want to be on intermittent HD long-term.  Encephalopathy due to ICU delirium Hx Bipolar disorder Hx EtOH use, variable intake report  Continue Seroquel 50 mg nightly, Precedex is off Delirium precautions Continue reorientation, lights on during the day, up during the day as able MVI  Anemia of critical illness -critical illness, renal failure, iatrogenic blood loss in setting of labs -Hgb 7 8/31  P Follow CBC Transfusion goal 7.0  Hypophosphatemia, improved Follow BMP Replace electrolytes as indicated  Status post CEA revision with generalized deconditioning PT Mobilization  GOC Limiting issues for overall progress appear to be his renal function and his respiratory function.  Attempt to push for ATC as this will help sort out whether he may require long-term ventilatory support.  Also plan to stop CVVHD in the near future to assess for renal recovery.  Family will need to continue discussions regarding whether he would want longer-term respiratory or renal support if required.  Appreciate palliative care input.  Best practice:  Diet: tube feeds  Pain/Anxiety/Delirium protocol (if indicated): qHS seroquel. Precedex off VAP protocol (if indicated): per protocol  DVT prophylaxis: SCDs GI prophylaxis: PPI Glucose control: Lantus + SSI Mobility: PT/OT  Code Status: FULL Family Communication: Daughter updated at bedside 8/31 with VVS and palliative care  Disposition: ICU  CRITICAL CARE Performed by: Collene Gobble   Independent critical care time 31 minutes  Baltazar Apo, MD, PhD 01/03/2020, 7:51 AM [facility] [phone] or if no answer [phone]

## 2020-01-03 NOTE — Progress Notes (Signed)
CRITICAL VALUE STICKER  CRITICAL VALUE: CBG 50 @ 3557  INTERVENTION: 1 amp D50 given @ 3220  RESPONSE: CBG 211 @ 2337

## 2020-01-03 NOTE — Progress Notes (Signed)
Physical Therapy Treatment Patient Details Name: Kelly Williams MRN: [id number] DOB: 1941/01/07 Today's Date: 01/03/2020    History of Present Illness Pt is a 79 y/o male with PMH of stroke, HTN, DM, periphreal neuropathy, carotid disease with L carotid endarterectomy, CABG x 5, R shoulder arthroplasthy, medication noncompliance and alcohol abuse. Daughter found patient after falling and intoxicated, L facial droop, slurring speech and aggression (reports hx of mulitple falls). MRI reveals acute ischemic infarcts of R frontal and parietal lobes. MRA of neck revealed focal stenosis in the right internal carotid artery approximately 3 cm distal to the bifurcation with an adjacent 11 mm aneurysmal dilatation. Pt underwent R carotid endarterectomy on 8/3. Pt with change in neuro status after procedure and was taken back to OR for exploration with no significant findings. Pt required intubation on 8/5 due to desaturation, extubated 12/08/19.  Reintubated on 8/11 due to hypoxia and transferred to ICU. Pt underwent tracheostomy on 8/21. Pt started on CRRT 8/26, removed on 9/2.    PT Comments    Pt with poor activity tolerance this session but is able to sit for brief 5 minute period at the edge of bed with assistance. Pt is profoundly weak but is able to initiate bed mobility with LEs and pulls trunk with assist into sitting. Pt is minimally communicative this session and appears to lose focus at times during session. Pt will continue to benefit from acute PT POC to improve strength and reduce caregiver burden. PT continues to recommend SNF vs LTACH pending medical needs.   Follow Up Recommendations  SNF;LTACH     Equipment Recommendations  Wheelchair (measurements PT);Wheelchair cushion (measurements PT);Hospital bed (mechanical lift, all if home today)    Recommendations for Other Services       Precautions / Restrictions Precautions Precautions: Fall Precaution Comments: trach, vent,  CRRT Restrictions Weight Bearing Restrictions: No    Mobility  Bed Mobility Overal bed mobility: Needs Assistance Bed Mobility: Supine to Sit;Sit to Supine;Rolling Rolling: Max assist   Supine to sit: Max assist;HOB elevated Sit to supine: Total assist      Transfers                 General transfer comment: deferred 2/2 fatigue and safety concerns  Ambulation/Gait                 Stairs             Wheelchair Mobility    Modified Rankin (Stroke Patients Only) Modified Rankin (Stroke Patients Only) Pre-Morbid Rankin Score: Moderate disability Modified Rankin: Severe disability     Balance Overall balance assessment: Needs assistance Sitting-balance support: Bilateral upper extremity supported;Feet supported Sitting balance-Leahy Scale: Poor Sitting balance - Comments: requires modA and BUE support Postural control: Posterior lean                                  Cognition Arousal/Alertness: Awake/alert Behavior During Therapy: Flat affect Overall Cognitive Status: Impaired/Different from baseline Area of Impairment: Attention;Memory;Following commands;Safety/judgement;Awareness;Problem solving                   Current Attention Level: Focused Memory: Decreased recall of precautions;Decreased short-term memory Following Commands: Follows one step commands with increased time Safety/Judgement: Decreased awareness of safety;Decreased awareness of deficits Awareness: Intellectual Problem Solving: Slow processing;Difficulty sequencing;Decreased initiation;Requires verbal cues;Requires tactile cues        Exercises  General Comments General comments (skin integrity, edema, etc.): pt BP of 84/55 once returned to supine after sitting at edge of bed, PT attempted to take BP in sitting but BP cuff would not read. Pt denied dizziness during sitting. RN present and aware of hypotension at end of session      Pertinent  Vitals/Pain Pain Assessment: Faces Faces Pain Scale: Hurts little more Pain Location: head Pain Descriptors / Indicators: Aching Pain Intervention(s): Monitored during session    Home Living                      Prior Function            PT Goals (current goals can now be found in the care plan section) Acute Rehab PT Goals Patient Stated Goal: none stated Time For Goal Achievement: 01/17/20 Potential to Achieve Goals: Poor Progress towards PT goals: Not progressing toward goals - comment    Frequency    Min 2X/week      PT Plan Current plan remains appropriate    Co-evaluation              AM-PAC PT "6 Clicks" Mobility   Outcome Measure  Help needed turning from your back to your side while in a flat bed without using bedrails?: Total Help needed moving from lying on your back to sitting on the side of a flat bed without using bedrails?: Total Help needed moving to and from a bed to a chair (including a wheelchair)?: Total Help needed standing up from a chair using your arms (e.g., wheelchair or bedside chair)?: Total Help needed to walk in hospital room?: Total Help needed climbing 3-5 steps with a railing? : Total 6 Click Score: 6    End of Session Equipment Utilized During Treatment: Oxygen Activity Tolerance: Patient limited by fatigue Patient left: in bed;with call bell/phone within reach;with bed alarm set;with nursing/sitter in room Nurse Communication: Mobility status;Need for lift equipment PT Visit Diagnosis: Other abnormalities of gait and mobility (R26.89)     Time: 6948-5462 PT Time Calculation (min) (ACUTE ONLY): 19 min  Charges:  $Therapeutic Activity: 8-22 mins                     Kelly Williams, PT, DPT Acute Rehabilitation Pager: [phone]    Kelly Williams 01/03/2020, 3:14 PM

## 2020-01-03 NOTE — Progress Notes (Signed)
[facility] ROUNDING NOTE   Subjective:   Brief history 79 year old gentleman with history of alcohol use disorder, PAD COPD coronary arteries diabetes mellitus type 2 hyperlipidemia hypertension CVA who underwent endarterectomy complicated by ventilator dependent acute kidney injury.  Renal ultrasound did not reveal any evidence of obstruction.  He started on CRRT 12/27/2019 for significant volume overload and uremia.  He continues to ultrafilter 50-100 cc an hour.  Blood pressure 86/54 pulse 91 temperature 99.6 O2 sats 98% FiO2 40%    Sodium 133 potassium 4.4 chloride 95 CO2 24 BUN 25 creatinine 1.09 glucose 239 calcium 9.7 phosphorus 1.9 magnesium 3 albumin 3.5 hemoglobin 10.9  Eliquis 5 mg twice daily atorvastatin 80 mg daily, insulin sliding scale, insulin Lantus 15 units daily, melatonin 3 mg daily, multivitamins 1 daily, Protonix 40 mg nightly, Seroquel 25 mg nightly.  Objective:  Vital signs in last 24 hours:  Temp:  [97.5 F (36.4 C)-99.6 F (37.6 C)] 99.6 F (37.6 C) (09/02 0400) Pulse Rate:  [82-99] 93 (09/02 0500) Resp:  [14-28] 23 (09/02 0700) BP: (92-134)/(53-75) 95/55 (09/02 0700) SpO2:  [98 %-100 %] 98 % (09/02 0803) FiO2 (%):  [40 %] 40 % (09/02 0803)  Weight change:  Filed Weights   12/20/2019 0500 12/25/19 0500 01/02/20 0500  Weight: 77 kg 77 kg 55.8 kg    Intake/Output: I/O last 3 completed shifts: In: 2688.8 [I.V.:13.6; Other:22; NG/GT:2353.2; IV BOFBPZWCH:852] Out: 7138 [DPOEU:2353]   Intake/Output this shift:  No intake/output data recorded.  General:NAD, comfortable, has tracheostomy, sitting on bed, alert awake Heart:RRR, s1s2 nl Lungs: Bibasal rhonchi Abdomen:soft, Non-tender, non-distended Extremities: Extremities edema + Dialysis Access: None   Basic Metabolic Panel: Recent Labs  Lab 12/30/19 0400 12/30/19 1242 12/31/19 0706 12/31/19 1603 01/01/20 0544 01/01/20 0544 01/01/20 1830 01/01/20 1830 01/02/20 0051  01/02/20 1704 01/03/20 0433  NA 133*   < > 135   < > 136  --  136  --  135 138 133*  K 5.2*   < > 4.4   < > 4.6  --  4.3  --  4.2 4.6 4.4  CL 99   < > 97*   < > 98  --  98  --  96* 97* 95*  CO2 24   < > 24   < > 26  --  25  --  _0 GLUCOSE 179*   < > 109*   < > 154*  --  178*  --  224* 205* 239*  BUN 27*   < > 22   < > 21  --  22  --  21 24* 25*  CREATININE 1.10   < > 1.01   < > 0.94  --  0.88  --  1.00 1.07 1.09  CALCIUM 8.6*   < > 8.8*   < > 9.2   < > 9.1   < > 9.0 10.0 9.7  MG 2.6*  --  2.6*  --  2.8*  --   --   --  2.7*  --  3.0*  PHOS 2.4*   < > 2.4*   < > 1.6*  --  3.9  --  2.8 2.0* 1.9*   < > = values in this interval not displayed.    Liver Function Tests: Recent Labs  Lab 01/01/20 0544 01/01/20 1830 01/02/20 0051 01/02/20 1704 01/03/20 0433  ALBUMIN 2.9* 3.1* 3.0* 3.5 3.5   No results for input(s): LIPASE, AMYLASE in the last 168 hours.  No results for input(s): AMMONIA in the last 168 hours.  CBC: Recent Labs  Lab 12/28/19 0424 01/01/20 0544 01/01/20 2133 01/02/20 0051 01/03/20 0433  WBC 15.5* 19.9*  --  20.8* 16.1*  NEUTROABS 13.2*  --   --   --   --   HGB 8.0* 7.0* 8.5* 8.8* 10.9*  HCT 25.8* 23.3* 27.1* 27.1* 34.6*  MCV 95.6 100.9*  --  96.4 99.4  PLT 419* 306  --  321 361    Cardiac Enzymes: No results for input(s): CKTOTAL, CKMB, CKMBINDEX, TROPONINI in the last 168 hours.  BNP: Invalid input(s): POCBNP  CBG: Recent Labs  Lab 01/02/20 1501 01/02/20 1930 01/02/20 2316 01/03/20 0330 01/03/20 0742  GLUCAP 170* 200* 173* 196* 70*    Microbiology: Results for orders placed or performed during the hospital encounter of 12/16/2019  Surgical pcr screen     Status: None   Collection Time: 12/30/2019  8:37 AM   Specimen: Vein; Nasal Swab  Result Value Ref Range Status   MRSA, PCR NEGATIVE NEGATIVE Final   Staphylococcus aureus NEGATIVE NEGATIVE Final    Comment: (NOTE) The Xpert SA Assay (FDA approved for NASAL specimens in patients  68 years of age and older), is one component of a comprehensive surveillance program. It is not intended to diagnose infection nor to guide or monitor treatment. Performed at [facility], [place] [address], [place], [place] [address]   Culture, respiratory (non-expectorated)     Status: None   Collection Time: 12/06/19  9:18 AM   Specimen: Tracheal Aspirate; Respiratory  Result Value Ref Range Status   Specimen Description TRACHEAL ASPIRATE  Final   Special Requests Normal  Final   Gram Stain NO WBC SEEN NO ORGANISMS SEEN   Final   Culture   Final    RARE Normal respiratory flora-no Staph aureus or Pseudomonas seen Performed at [facility], [address] [place], [place] [address]    Report Status 12/08/2019 FINAL  Final  Culture, blood (routine x 2)     Status: None   Collection Time: 12/12/19  7:17 AM   Specimen: BLOOD LEFT ARM  Result Value Ref Range Status   Specimen Description BLOOD LEFT ARM  Final   Special Requests   Final    BOTTLES DRAWN AEROBIC ONLY Blood Culture adequate volume   Culture   Final    NO GROWTH 5 DAYS Performed at [facility], [place] [address] [place], [place] [address]    Report Status 12/17/2019 FINAL  Final  Culture, blood (routine x 2)     Status: None   Collection Time: 12/12/19  7:17 AM   Specimen: BLOOD LEFT ARM  Result Value Ref Range Status   Specimen Description BLOOD LEFT ARM  Final   Special Requests   Final    BOTTLES DRAWN AEROBIC ONLY Blood Culture adequate volume   Culture   Final    NO GROWTH 5 DAYS Performed at [facility], [place] [address], [place], [place] [address]    Report Status 12/17/2019 FINAL  Final  Culture, respiratory (non-expectorated)     Status: None   Collection Time: 12/14/19  2:40 PM   Specimen: Tracheal Aspirate; Respiratory  Result Value Ref Range Status   Specimen Description TRACHEAL ASPIRATE  Final   Special Requests NONE  Final   Gram Stain   Final    FEW WBC PRESENT,  PREDOMINANTLY PMN NO ORGANISMS SEEN Performed at [facility], [place] [address], [place], [place] [address]    Culture RARE PSEUDOMONAS AERUGINOSA  Final   Report Status 12/17/2019 FINAL  Final   Organism ID, Bacteria PSEUDOMONAS AERUGINOSA  Final      Susceptibility   Pseudomonas aeruginosa - MIC*    CEFTAZIDIME 2 SENSITIVE Sensitive     CIPROFLOXACIN <=0.25 SENSITIVE Sensitive     GENTAMICIN <=1 SENSITIVE Sensitive     IMIPENEM 2 SENSITIVE Sensitive     PIP/TAZO 8 SENSITIVE Sensitive     CEFEPIME 2 SENSITIVE Sensitive     * RARE PSEUDOMONAS AERUGINOSA  Surgical PCR screen     Status: None   Collection Time: 12/17/19  9:29 AM   Specimen: Nasal Mucosa; Nasal Swab  Result Value Ref Range Status   MRSA, PCR NEGATIVE NEGATIVE Final   Staphylococcus aureus NEGATIVE NEGATIVE Final    Comment: (NOTE) The Xpert SA Assay (FDA approved for NASAL specimens in patients 39 years of age and older), is one component of a comprehensive surveillance program. It is not intended to diagnose infection nor to guide or monitor treatment. Performed at [facility], [place] [address], [place], [place] [address]   Culture, respiratory (non-expectorated)     Status: None (Preliminary result)   Collection Time: 01/01/20  9:32 AM   Specimen: Tracheal Aspirate; Respiratory  Result Value Ref Range Status   Specimen Description TRACHEAL ASPIRATE  Final   Special Requests NONE  Final   Gram Stain   Final    MODERATE WBC PRESENT, PREDOMINANTLY PMN ABUNDANT GRAM NEGATIVE RODS ABUNDANT GRAM POSITIVE COCCI FEW YEAST Performed at [facility], [place] [address] [place], [place] [address]    Culture ABUNDANT GRAM NEGATIVE RODS  Final   Report Status PENDING  Incomplete    Coagulation Studies: No results for input(s): LABPROT, INR in the last 72 hours.  Urinalysis: No results for input(s): COLORURINE, LABSPEC, PHURINE, GLUCOSEU, HGBUR, BILIRUBINUR, KETONESUR, PROTEINUR, UROBILINOGEN, NITRITE,  LEUKOCYTESUR in the last 72 hours.  Invalid input(s): APPERANCEUR    Imaging: DG Chest Port 1 View  Result Date: 01/03/2020 CLINICAL DATA:  Acute respiratory failure. EXAM: PORTABLE CHEST 1 VIEW COMPARISON:  01/02/2020. FINDINGS: Surgical clips noted over the right neck. Tracheostomy tube in stable position. Feeding tube noted with tip over the upper stomach. Right subclavian line stable position. Prior CABG. Heart size stable. Diffuse mild interstitial prominence again noted. No pleural effusion or pneumothorax. Stable elevation right hemidiaphragm. Right shoulder replacement. IMPRESSION: 1. Tracheostomy tube in stable position. Feeding tube noted with tip over the upper stomach. Right subclavian line stable position. 2.  Prior CABG.  Heart size normal. 3. Diffuse mild bilateral interstitial prominence again noted without interim change. Stable elevation right hemidiaphragm. Electronically Signed   By: Marcello Moores  Register   On: 01/03/2020 06:05   DG CHEST PORT 1 VIEW  Result Date: 01/02/2020 CLINICAL DATA:  Pulmonary edema. EXAM: PORTABLE CHEST 1 VIEW COMPARISON:  01/01/2020. FINDINGS: 0517 hours. Tracheostomy tube again noted. A feeding tube passes into the stomach although the distal tip position is not included on the film. Right subclavian central line tip overlies the region of the innominate vein confluence. Stable asymmetric elevation right hemidiaphragm with right base streaky opacity suggesting atelectasis and tiny right effusion. Interstitial markings are diffusely coarsened with chronic features. Cardiopericardial silhouette is at upper limits of normal for size. Telemetry leads overlie the chest. IMPRESSION: Cardiomegaly with diffuse interstitial opacity slightly improved in the interval, potentially reflecting decreasing pulmonary edema.  Asymmetric elevation right hemidiaphragm with right base atelectasis and tiny right effusion. Electronically Signed   By: Misty Stanley M.D.   On: 01/02/2020  07:20     Medications:   . sodium chloride Stopped (01/02/20 2221)  . ceFEPime (MAXIPIME) IV Stopped (01/02/20 2201)  . feeding supplement (JEVITY 1.2 CAL) 1,000 mL (01/03/20 0403)  . prismasol BGK 2/2.5 replacement solution 500 mL/hr at 01/03/20 0503  . prismasol BGK 2/2.5 replacement solution 300 mL/hr at 01/02/20 0735  . prismasol BGK 4/2.5 1,500 mL/hr at 01/03/20 0505   . sodium chloride   Intravenous Once  . apixaban  5 mg Per Tube BID  . atorvastatin  80 mg Per Tube QHS  . budesonide (PULMICORT) nebulizer solution  0.5 mg Nebulization BID  . chlorhexidine gluconate (MEDLINE KIT)  15 mL Mouth Rinse BID  . Chlorhexidine Gluconate Cloth  6 each Topical Q0600  . feeding supplement (PROSource TF)  45 mL Per Tube TID  . folic acid  1 mg Per Tube Daily  . insulin aspart  0-15 Units Subcutaneous Q4H  . insulin glargine  15 Units Subcutaneous Daily  . ipratropium-albuterol  3 mL Nebulization TID  . mouth rinse  15 mL Mouth Rinse 10 times per day  . melatonin  3 mg Per Tube QHS  . multivitamin with minerals  1 tablet Per Tube Daily  . pantoprazole sodium  40 mg Per Tube QHS  . QUEtiapine  50 mg Per Tube QHS  . sennosides  10 mL Per Tube QHS  . thiamine  100 mg Per Tube Daily   Or  . thiamine  100 mg Intravenous Daily   sodium chloride, acetaminophen **OR** acetaminophen, fentaNYL (SUBLIMAZE) injection, fentaNYL (SUBLIMAZE) injection, haloperidol lactate, heparin, hydrALAZINE, ipratropium-albuterol, metoprolol tartrate, ondansetron (ZOFRAN) IV, phenol, polyethylene glycol  Assessment/ Plan:  1. Severe nonoliguric AKI on CKD 3: Likely due to cardiorenal syndrome.  Renal ultrasound without obstruction.  Complicated hospital course with fluctuation in serum creatinine level.  Started CRRT on 8/26 for significant volume overload and uremia.    Will keep even on CRRT due to hypotension   2.Anticoagulation: Continues to use apixaban 5 mg twice daily  3.HTN/Volume Status: Keep even  on CRRT due to hypotension no pressors at this time  4.Acute hypoxic respiratory failure/ventilatory dependence: Management per primary team.  He has tracheostomy and on vent.  5.Acute pulmonary edema/CHF: Ultrafiltration with dialysis as discussed above.[name]  6.Anemia multifactorial including kidney disease, chronic illness: Continue to monitor.Ferritin 325. Iron saturation of 12. Feraheme x2 doses.  Monitor hemoglobin. Received Aranesp on 8/27.  7. Hyponatremia hypervolemic: Managing with dialysis.  8.  Hypophosphatemia will replete 01/03/2020    LOS: [place] _0 _1 :07 AM

## 2020-01-03 NOTE — Progress Notes (Signed)
Palliative:  HPI:79 y.o.malewith past medical history of stroke, hypertension, hyperlipidemia, diabetic retinopathy, DM, CAD, s/p CABG, COPD, CKD, arthritis, ETOH useadmitted on 8/3/2021with history of recurrent right ICA occlusion s/p CEA. Developed respiratory failure requiring intubation. Patient self-extubated 8/5, again extubated 8/7. On 8/11 AM, patient became unresponsive and hypoxemic requiring re-intubation and transfer to ICU. Trach placed 8/21. PT/OT following. Likely will need LTACH placement. Patient now with worsening AKI on CKD, possibly cardiorenal syndrome. Nephrology consulted. Patient receiving high-dose IV diuretics but worsening creatinine. May requiring dialysis and poor candidate for long-term dialysis with underlying chronic conditions and current condition with tracheostomy/vent dependency. Palliative medicine consultation for goals of care.  I spoke again today with Kelly Williams after visiting with Kelly Williams and discussing with RN. I explained to Kelly Williams that he is awake, alert, and following commands but affect is more flat and responses more delayed. He just seems more "out of it" today. Kelly Williams notes that he was like that yesterday at the end of her visit. He got teary-eyed when she showed him picture of his cat. We discussed that his fluid status is improving but he continues with excessive secretions that may limit his ability to transition to trach collar.    Kelly Williams is off work in the morning and plans to be at hospital ~7a-10a and hoping to discuss further plans and recommendations for how to proceed with CRRT. We have already established that when stopped dialysis would not be restarted. We will discuss further tomorrow.   All questions/concerns addressed. Emotional support provided. Discussed with bedside RN. Updated Dr. Lamonte Sakai and Dr. Justin Mend.   Exam: Alert, tracking, following commands. Less restless and resting comfortably in bed. Although with flat affect and more  delayed response than yesterday. Weaning on vent via trach. Abd soft, flat. Moves all extremities.    Plan: - Plan to meet again with daughter at bedside in the morning ~0800 am to continue [place] conversations.   25 min  Vinie Sill, NP Palliative Medicine Team Pager [phone] (Please see [email or web address] for schedule) Team Phone [phone]    Greater than 50%  of this time was spent counseling and coordinating care related to the above assessment and plan

## 2020-01-03 NOTE — Progress Notes (Signed)
eLink Physician-Brief Progress Note Patient Name: Kelly Williams DOB: 06-26-1940 MRN: [id number]   Date of Service  01/03/2020  HPI/Events of Note  Multiple issues: 1. Change in mental status - patient less interact than earlier today. Stares into space. 2. Frequent PVC's - Last K+ = 4.9 at 6:44 PM. Blood glucose = 429.    eICU Interventions  Plan: 1. Head CT Scan w/o contrast STAT. 2. Magnesium level STAT. 3. Will likely require insulin IV infusion per [employer] protocol.      Intervention Category Major Interventions: Change in mental status - evaluation and management;Arrhythmia - evaluation and management  Laquinn Shippy Eugene 01/03/2020, 9:43 PM

## 2020-01-04 LAB — CULTURE, RESPIRATORY W GRAM STAIN

## 2020-01-04 LAB — GLUCOSE, CAPILLARY
Glucose-Capillary: 236 mg/dL — ABNORMAL HIGH (ref 70–99)
Glucose-Capillary: 401 mg/dL — ABNORMAL HIGH (ref 70–99)
Glucose-Capillary: 438 mg/dL — ABNORMAL HIGH (ref 70–99)
Glucose-Capillary: 390 mg/dL — ABNORMAL HIGH (ref 70–99)
Glucose-Capillary: 433 mg/dL — ABNORMAL HIGH (ref 70–99)
Glucose-Capillary: 310 mg/dL — ABNORMAL HIGH (ref 70–99)
Glucose-Capillary: 222 mg/dL — ABNORMAL HIGH (ref 70–99)
Glucose-Capillary: 148 mg/dL — ABNORMAL HIGH (ref 70–99)
Glucose-Capillary: 147 mg/dL — ABNORMAL HIGH (ref 70–99)

## 2020-01-04 LAB — RENAL FUNCTION PANEL
Albumin: 3.1 g/dL — ABNORMAL LOW (ref 3.5–5.0)
Anion gap: 19 — ABNORMAL HIGH (ref 5–15)
BUN: 78 mg/dL — ABNORMAL HIGH (ref 8–23)
CO2: 18 mmol/L — ABNORMAL LOW (ref 22–32)
Calcium: 9.4 mg/dL (ref 8.9–10.3)
Chloride: 95 mmol/L — ABNORMAL LOW (ref 98–111)
Creatinine, Ser: 2.43 mg/dL — ABNORMAL HIGH (ref 0.61–1.24)
GFR calc Af Amer: 28 mL/min — ABNORMAL LOW (ref >60)
GFR calc non Af Amer: 24 mL/min — ABNORMAL LOW (ref >60)
Glucose, Bld: 443 mg/dL — ABNORMAL HIGH (ref 70–99)
Phosphorus: 6.2 mg/dL — ABNORMAL HIGH (ref 2.5–4.6)
Potassium: 5.4 mmol/L — ABNORMAL HIGH (ref 3.5–5.1)
Sodium: 132 mmol/L — ABNORMAL LOW (ref 135–145)

## 2020-01-04 LAB — CBC
HCT: 33.3 % — ABNORMAL LOW (ref 39.0–52.0)
Hemoglobin: 10.3 g/dL — ABNORMAL LOW (ref 13.0–17.0)
MCH: 31.5 pg (ref 26.0–34.0)
MCHC: 30.9 g/dL (ref 30.0–36.0)
MCV: 101.8 fL — ABNORMAL HIGH (ref 80.0–100.0)
Platelets: 374 10*3/uL (ref 150–400)
RBC: 3.27 MIL/uL — ABNORMAL LOW (ref 4.22–5.81)
RDW: 21 % — ABNORMAL HIGH (ref 11.5–15.5)
WBC: 19.9 10*3/uL — ABNORMAL HIGH (ref 4.0–10.5)
nRBC: 0.5 % — ABNORMAL HIGH (ref 0.0–0.2)

## 2020-01-04 LAB — MAGNESIUM: Magnesium: 3.1 mg/dL — ABNORMAL HIGH (ref 1.7–2.4)

## 2020-01-04 MED ORDER — INSULIN REGULAR(HUMAN) IN NACL 100-0.9 UT/100ML-% IV SOLN
INTRAVENOUS | Status: DC
  Administered 2020-01-04: 8.5 [IU]/h via INTRAVENOUS

## 2020-01-04 MED ORDER — SODIUM CHLORIDE 0.9 % IV SOLN: 250.0000 mL | INTRAVENOUS | Status: DC

## 2020-01-04 MED ORDER — PHENYLEPHRINE HCL-NACL 10-0.9 MG/250ML-% IV SOLN
25.0000 ug/min | INTRAVENOUS | Status: DC
  Administered 2020-01-04: 25 ug/min via INTRAVENOUS
  Administered 2020-01-04: 140 ug/min via INTRAVENOUS
  Filled 2020-01-04 (×2): qty 250
  Filled 2020-01-04: qty 500

## 2020-01-04 MED ORDER — DEXTROSE 50 % IV SOLN: 0.0000 mL | INTRAVENOUS | Status: DC | PRN

## 2020-01-04 MED ORDER — LORAZEPAM 2 MG/ML IJ SOLN: 1.0000 mg | INTRAMUSCULAR | Status: DC | PRN

## 2020-01-04 MED ORDER — FENTANYL BOLUS VIA INFUSION
50.0000 ug | INTRAVENOUS | Status: DC | PRN
  Filled 2020-01-04: qty 100

## 2020-01-04 MED ORDER — POLYVINYL ALCOHOL 1.4 % OP SOLN
1.0000 [drp] | Freq: Four times a day (QID) | OPHTHALMIC | Status: DC | PRN
  Filled 2020-01-04: qty 15

## 2020-01-04 MED ORDER — FENTANYL 2500MCG IN NS 250ML (10MCG/ML) PREMIX INFUSION
50.0000 ug/h | INTRAVENOUS | Status: DC
  Administered 2020-01-04: 50 ug/h via INTRAVENOUS
  Filled 2020-01-04: qty 250

## 2020-01-04 MED ORDER — GLYCOPYRROLATE 0.2 MG/ML IJ SOLN
0.4000 mg | INTRAMUSCULAR | Status: DC
  Administered 2020-01-04: 0.4 mg via INTRAVENOUS
  Filled 2020-01-04: qty 2

## 2020-01-04 MED ORDER — DEXTROSE-NACL 5-0.45 % IV SOLN: INTRAVENOUS | Status: DC

## 2020-02-01 NOTE — Progress Notes (Signed)
NAME:  Kelly Williams, MRN:  [id number], DOB:  01/12/1941, LOS: 69 ADMISSION DATE:  12/17/2019, CONSULTATION DATE:  12/12/2019 REFERRING MD:  Oneida Alar, CHIEF COMPLAINT:  Vent management for acute respiratory failure   Brief History   79 year old male with history of recurrent R ICA occlusion post-CEA who developed respiratory failure requiring reintubation and pneumonia. He initially failed self extubation 8/5 after 24 hours off vent, and was again extubated 8/7 with seemingly good toleration. Then in the early am hours of 8/11, several hours after self-removal of Cortrak, he became unresponsive and hypoxemic requiring reintubation and transfer to ICU.  Patient is awaiting trach placement.  Past Medical History   Past Medical History:  Diagnosis Date  . Alcohol abuse   . Alcohol abuse   . Arthritis   . Carotid arterial disease ([place])   . Chronic kidney disease    CKD-patient denies  . COPD (chronic obstructive pulmonary disease) (H[facility])    uses inhalers  . Coronary artery disease   . Diabetes mellitus without complication ([place])   . Diabetic retinopathy ([place])   . Hyperlipidemia   . Hypertension   . Normocytic anemia 01/16/2019  . Stroke [facility])    around 2020, 2 after bypass in 2015   Significant Hospital Events   8/3: admitted to [facility], CEA, PCCM consulted for vent management 8/4: self extubated 8/5: Re-intubated overnight 8/7: Extubated 8/11: Reintubation   Consults:  Neurology, Cardiology.  Procedures:  8/3: ETT out 821 12/18/2019 trach per ENT 8/3: R CEA 8/3: Exploration of R CEA and R ICA thrombectomy 8/3: R art line out 12/27/2019 right subclavian hemodialysis catheter Significant Diagnostic Tests:  Carotid duplex 8/3 > right ICA occlusion CTA Head/Neck 8/3 > No acute intracranial abnormality. Post repeat right carotid endarterectomy. Decreased irregularity of the proximal cervical ICA. 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 Persistent narrowing and irregularity of the distal ICA with increased eccentric intraluminal filling defect, which may reflect unstable/migrated clot. 60-70% stenosis of the proximal left ICA. No proximal intracranial vessel occlusion. Stable findings of multifocal intracranial atherosclerosis. MRI Brain wo Contrast 8/3 > 1. Unchanged appearance of acute right MCA territory infarct. No new site of acute ischemia. 2. Unchanged generalized atrophy and findings of chronic ischemic Microangiopathy. MR Brain wo Contrast 8/6 > Unchanged appearance of a subacute right MCA territory infarct centered within the right frontal operculum as compared to the MRI of 12/06/2019. No significant mass effect or midline shift. Expected evolution of a known subacute infarct within the right parietal lobe. No interval acute infarct is demonstrated. Stable background generalized parenchymal atrophy and chronic ischemic changes with multiple chronic infarcts as detailed. CT Head wo Contrast 8/11 > 1. Stable appearance of subacute and chronic right MCA infarcts. 2. Stable remote infarcts of the high right frontal lobe and left basal ganglia. 3. No acute intracranial abnormality or significant interval change. CXR 8/31> bilateral infiltrates, edema. Slight improvement form prior. Small R pleural effusion  Micro Data:  MRSA 8/3 Negative Respiratory Cx 8/5 > Negative  Blood Cx 8/11 > Negative  Respiratory Cx 8/13 > pansensitive pseudomonas MRSA 8/16 Negative Tracheal aspirate 8/31>> abundant GNR >>   Antimicrobials:  Cefazolin 8/3 Unasyn 8/5 > 8/12 Zosyn 8/12> 8/16 Ceftazidime 8/16> 8/17 Cefepime 8/31>>   Interim history/subjective:  This morning patient looked tachypneic, uncomfortable and hypotensive. Patient's daughter is at bedside  Objective   Blood pressure (!) 73/46, pulse 86, temperature 100.1 F (37.8 C), temperature  source Axillary, resp. rate (!) 27, height 5\' 6"  (1.676 m), weight  55.8 kg, SpO2 100 %.    Vent Mode: PRVC FiO2 (%):  [40 %] 40 % Set Rate:  [18 bmp] 18 bmp Vt Set:  [510 mL] 510 mL PEEP:  [5 cmH20] 5 cmH20 Plateau Pressure:  [17 cmH20-21 cmH20] 17 cmH20   Intake/Output Summary (Last 24 hours) at Feb 02, 2020 1429 Last data filed at Feb 02, 2020 1000 Gross per 24 hour  Intake 2338.21 ml  Output --  Net 2338.21 ml   Filed Weights   12/09/2019 0500 12/25/19 0500 01/02/20 0500  Weight: 77 kg 77 kg 55.8 kg   Examination: General: Acute and chronically ill-appearing man, ventilated, looks in acute distress HEENT: Trach in place, temporal wasting, stoma clean and dry, no secretions Neuro: Eyes closed, not following commands CV: Regular, distant, well-perfused, no edema PULM: Tachypneic, decreased bilateral breath sounds, scattered rhonchi, no crackles or wheezes GI: Nondistended, positive bowel sounds GU: WNL Extremities: Thin, no edema Skin: No rash  Assessment & Plan:   Critically ill due to acute hypoxic respiratory failure s/p multiple failed extubations, now trach and vent dependent  Suspected healthcare associated pneumonia Septic shock AKI on CKD Acute metabolic encephalopathy Status post redo CEA Anemia of critical illness   This morning patient looked very uncomfortable and tachypneic.  His vasopressor requirement went up Spoke with patient's daughter at length, explained that patient's has a started deteriorating further CT scan of head showed evolving right MCA infarct After lengthy discussion, patient's daughter requested that he should be kept comfortable without escalation of treatment.  He was made DO NOT RESUSCITATE Patient was made comfort measures only.  Primary team Dr. Oneida Alar was notified about change in patient's CODE STATUS and proceeding with comfort care only   Best practice:  Diet: tube feeds  Pain/Anxiety/Delirium protocol (if indicated): Fentanyl infusion VAP protocol (if indicated): per protocol  DVT prophylaxis:  SCDs GI prophylaxis: PPI Glucose control: Lantus + SSI Mobility: PT/OT  Code Status: FULL Family Communication: Patient's daughter was updated at bedside Disposition: ICU   Total critical care time: 48 minutes  Performed by: [place] care time was exclusive of separately billable procedures and treating other patients.   Critical care was necessary to treat or prevent imminent or life-threatening deterioration.   Critical care was time spent personally by me on the following activities: development of treatment plan with patient and/or surrogate as well as nursing, discussions with consultants, evaluation of patient's response to treatment, examination of patient, obtaining history from patient or surrogate, ordering and performing treatments and interventions, ordering and review of laboratory studies, ordering and review of radiographic studies, pulse oximetry and re-evaluation of patient's condition.   Jacky Kindle MD Critical care physician [facility]  Pager: [phone] Mobile: [phone]

## 2020-02-01 NOTE — Progress Notes (Signed)
[facility]  Subjective  - Does not respond with purposeful movement or opening eyes.   Objective (!) 90/50 (!) 104 97.9 F (36.6 C) (Oral) (!) 26 100%  Intake/Output Summary (Last 24 hours) at 01/18/2020 0734 Last data filed at January 18, 2020 0700 Gross per 24 hour  Intake 2268.1 ml  Output 505 ml  Net 1763.1 ml    Trach collar in place remains on vent for respiratory support.  Assessment/Planning: Redo CEA post op complications with swallowing and Pneumonia leukocytosis.  Intubated now trach collar in place requiring ventilator support.  Not responsive to verbal ques this am.    Cr elevated on previous labs, Urine OP > 2200 last 24 hours. Pending am labs  Roxy Horseman 01-18-2020 7:34 AM --  Laboratory Lab Results: Recent Labs    01/02/20 0051 01/03/20 0433  WBC 20.8* 16.1*  HGB 8.8* 10.9*  HCT 27.1* 34.6*  PLT 321 361   BMET Recent Labs    01/03/20 0433 01/03/20 1844  NA 133* 131*  K 4.4 4.9  CL 95* 95*  CO2 24 19*  GLUCOSE 239* 429*  BUN 25* 51*  CREATININE 1.09 1.72*  CALCIUM 9.7 9.3    COAG Lab Results  Component Value Date   INR 1.2 12/05/2019   INR 1.2 12/17/2019   INR 1.0 12/07/2019   No results found for: PTT

## 2020-02-01 NOTE — Progress Notes (Signed)
OT Discharge Note  Patient Details Name: Kelly Williams MRN: [id number] DOB: 10-24-1940   Cancelled Treatment:     OT orders discontinued.  OT will sign off at this time.   [name]., OTR/L Acute Rehabilitation Services Pager [phone] Office [phone]  [name]., OTR/L Acute Rehabilitation Services Pager [phone] Office [phone]  [name] M Jan 13, 2020, 12:33 PM

## 2020-02-01 NOTE — Progress Notes (Signed)
Palliative:  HPI:79 y.o.malewith past medical history of stroke, hypertension, hyperlipidemia, diabetic retinopathy, DM, CAD, s/p CABG, COPD, CKD, arthritis, ETOH useadmitted on 8/3/2021with history of recurrent right ICA occlusion s/p CEA. Developed respiratory failure requiring intubation. Patient self-extubated 8/5, again extubated 8/7. On 8/11 AM, patient became unresponsive and hypoxemic requiring re-intubation and transfer to ICU. Trach placed 8/21. PT/OT following. Likely will need LTACH placement. Patient now with worsening AKI on CKD, possibly cardiorenal syndrome. Nephrology consulted. Patient receiving high-dose IV diuretics but worsening creatinine. May requiring dialysis and poor candidate for long-term dialysis with underlying chronic conditions and current condition with tracheostomy/vent dependency. Palliative medicine consultation for goals of care.  I met today at today at Kelly Williams bedside with daughter, Kelly Williams. Kelly Williams has had significant decline since yesterday with hypotension requiring vasopressors and now unresponsive. He currently appears to be in active stages of dying with pallor and facial muscles relaxed and withdrawn. Appears somewhat agonal on vent. Extremities cool to touch. I had discussion with Kelly Williams and she recognizes that he is dying. I reminded her of our conversation the other day that his body will let us know what we should do as he will show improvements or he would further decline. She is struggling with making the final decision for DNR and comfort care and needs to discuss with other providers. Discussed with PCCM and will follow up after their conversation.   Update: I followed up with Kelly Williams after she had a chance to discuss with PCCM Dr. Tacy Learn. She agrees to DNR and comfort care. We discussed addition of fentanyl infusion to ensure comfort and de-escalation of vasopressors to transition to comfort. Will remove ventilator support when other  family arrive to bedside. I told Kelly Williams that he could die even on ventilator at this stage. Orders placed and discussed with RN. Notified Dr. Oneida Williams to decision and plan.   Update: Kelly Williams did die on ventilator support. I went to provide condolences to daughter, Kelly Williams, and his nephew at bedside.   Exam: Patient deceased.   Plan: - He was made DNR comfort care status prior to death.   [name], NP Palliative Medicine Team Pager [phone] (Please see [email or web address] for schedule) Team Phone [phone]    Greater than 50%  of this time was spent counseling and coordinating care related to the above assessment and plan

## 2020-02-01 NOTE — Progress Notes (Addendum)
[facility] ROUNDING NOTE   Subjective:   Brief history 79 year old gentleman with history of alcohol use disorder, PAD COPD coronary arteries diabetes mellitus type 2 hyperlipidemia hypertension CVA who underwent endarterectomy complicated by ventilator dependent acute kidney injury.  Renal ultrasound did not reveal any evidence of obstruction.  He started on CRRT 12/27/2019 - 01/03/20     Blood pressure 91/56 pulse 103 temperature 97.5 O2 sats 100% FiO2 40%  Sodium 131 potassium 4.9 chloride 95 CO2 19 BUN 31 creatinine 1.72 glucose 429 calcium 9.3 phosphorus 5.9 albumin 3.3 hemoglobin 10.9  Eliquis 5 mg twice daily atorvastatin 80 mg daily, insulin sliding scale, insulin Lantus 15 units daily, melatonin 3 mg daily, multivitamins 1 daily, Protonix 40 mg nightly, Seroquel 25 mg nightly.  Objective:  Vital signs in last 24 hours:  Temp:  [97.5 F (36.4 C)-99 F (37.2 C)] 97.9 F (36.6 C) (09/03 0400) Pulse Rate:  [52-117] 104 (09/03 0700) Resp:  [11-32] 26 (09/03 0700) BP: (71-136)/(46-78) 90/50 (09/03 0700) SpO2:  [95 %-100 %] 100 % (09/03 0730) FiO2 (%):  [40 %] 40 % (09/03 0730)  Weight change:  Filed Weights   12/21/2019 0500 12/25/19 0500 01/02/20 0500  Weight: 77 kg 77 kg 55.8 kg    Intake/Output: I/O last 3 completed shifts: In: 3233.7 [I.V.:121.8; Other:22; NG/GT:2630; IV Piggyback:459.9] Out: 2871 [Other:2871]   Intake/Output this shift:  No intake/output data recorded.  General:NAD, comfortable, has tracheostomy, sitting on bed, alert awake Heart:RRR, s1s2 nl Lungs: Bibasal rhonchi Abdomen:soft, Non-tender, non-distended Extremities: Extremities edema + Dialysis Access: None   Basic Metabolic Panel: Recent Labs  Lab 12/30/19 0400 12/30/19 1242 12/31/19 0706 12/31/19 1603 01/01/20 0544 01/01/20 0544 01/01/20 1830 01/01/20 1830 01/02/20 0051 01/02/20 0051 01/02/20 1704 01/03/20 0433 01/03/20 1844  NA 133*   < > 135   < > 136   < > 136   --  135  --  138 133* 131*  K 5.2*   < > 4.4   < > 4.6   < > 4.3  --  4.2  --  4.6 4.4 4.9  CL 99   < > 97*   < > 98   < > 98  --  96*  --  97* 95* 95*  CO2 24   < > 24   < > 26   < > 25  --  24  --  25 24 19*  GLUCOSE 179*   < > 109*   < > 154*   < > 178*  --  224*  --  205* 239* 429*  BUN 27*   < > 22   < > 21   < > 22  --  21  --  24* 25* 51*  CREATININE 1.10   < > 1.01   < > 0.94   < > 0.88  --  1.00  --  1.07 1.09 1.72*  CALCIUM 8.6*   < > 8.8*   < > 9.2   < > 9.1   < > 9.0   < > 10.0 9.7 9.3  MG 2.6*  --  2.6*  --  2.8*  --   --   --  2.7*  --   --  3.0*  --   PHOS 2.4*   < > 2.4*   < > 1.6*   < > 3.9  --  2.8  --  2.0* 1.9* 5.9*   < > = values in this interval not  displayed.    Liver Function Tests: Recent Labs  Lab 01/01/20 1830 01/02/20 0051 01/02/20 1704 01/03/20 0433 01/03/20 1844  ALBUMIN 3.1* 3.0* 3.5 3.5 3.3*   No results for input(s): LIPASE, AMYLASE in the last 168 hours. No results for input(s): AMMONIA in the last 168 hours.  CBC: Recent Labs  Lab 01/01/20 0544 01/01/20 2133 01/02/20 0051 01/03/20 0433  WBC 19.9*  --  20.8* 16.1*  HGB 7.0* 8.5* 8.8* 10.9*  HCT 23.3* 27.1* 27.1* 34.6*  MCV 100.9*  --  96.4 99.4  PLT 306  --  321 361    Cardiac Enzymes: No results for input(s): CKTOTAL, CKMB, CKMBINDEX, TROPONINI in the last 168 hours.  BNP: Invalid input(s): POCBNP  CBG: Recent Labs  Lab 01-29-2020 0430 Jan 29, 2020 0502 29-Jan-2020 0602 01-29-2020 0703 2020/01/29 0745  GLUCAP 433* 15* 18* 74* 148*    Microbiology: Results for orders placed or performed during the hospital encounter of 12/27/2019  Surgical pcr screen     Status: None   Collection Time: 12/27/2019  8:37 AM   Specimen: Vein; Nasal Swab  Result Value Ref Range Status   MRSA, PCR NEGATIVE NEGATIVE Final   Staphylococcus aureus NEGATIVE NEGATIVE Final    Comment: (NOTE) The Xpert SA Assay (FDA approved for NASAL specimens in patients 25 years of age and older), is one component of a  comprehensive surveillance program. It is not intended to diagnose infection nor to guide or monitor treatment. Performed at [facility], [place] [address], [place], [place] [address]   Culture, respiratory (non-expectorated)     Status: None   Collection Time: 12/06/19  9:18 AM   Specimen: Tracheal Aspirate; Respiratory  Result Value Ref Range Status   Specimen Description TRACHEAL ASPIRATE  Final   Special Requests Normal  Final   Gram Stain NO WBC SEEN NO ORGANISMS SEEN   Final   Culture   Final    RARE Normal respiratory flora-no Staph aureus or Pseudomonas seen Performed at [facility], [address] [place], [place] [address]    Report Status 12/08/2019 FINAL  Final  Culture, blood (routine x 2)     Status: None   Collection Time: 12/12/19  7:17 AM   Specimen: BLOOD LEFT ARM  Result Value Ref Range Status   Specimen Description BLOOD LEFT ARM  Final   Special Requests   Final    BOTTLES DRAWN AEROBIC ONLY Blood Culture adequate volume   Culture   Final    NO GROWTH 5 DAYS Performed at [facility], [place] [address], [place], [place] [address]    Report Status 12/17/2019 FINAL  Final  Culture, blood (routine x 2)     Status: None   Collection Time: 12/12/19  7:17 AM   Specimen: BLOOD LEFT ARM  Result Value Ref Range Status   Specimen Description BLOOD LEFT ARM  Final   Special Requests   Final    BOTTLES DRAWN AEROBIC ONLY Blood Culture adequate volume   Culture   Final    NO GROWTH 5 DAYS Performed at [facility], [place] [address] [place], [place] [address]    Report Status 12/17/2019 FINAL  Final  Culture, respiratory (non-expectorated)     Status: None   Collection Time: 12/14/19  2:40 PM   Specimen: Tracheal Aspirate; Respiratory  Result Value Ref Range Status   Specimen Description TRACHEAL ASPIRATE  Final   Special Requests NONE  Final   Gram Stain  Final    FEW WBC PRESENT, PREDOMINANTLY PMN NO ORGANISMS SEEN Performed at [place] [address] [place], [place] [address]    Culture RARE PSEUDOMONAS AERUGINOSA  Final   Report Status 12/17/2019 FINAL  Final   Organism ID, Bacteria PSEUDOMONAS AERUGINOSA  Final      Susceptibility   Pseudomonas aeruginosa - MIC*    CEFTAZIDIME 2 SENSITIVE Sensitive     CIPROFLOXACIN <=0.25 SENSITIVE Sensitive     GENTAMICIN <=1 SENSITIVE Sensitive     IMIPENEM 2 SENSITIVE Sensitive     PIP/TAZO 8 SENSITIVE Sensitive     CEFEPIME 2 SENSITIVE Sensitive     * RARE PSEUDOMONAS AERUGINOSA  Surgical PCR screen     Status: None   Collection Time: 12/17/19  9:29 AM   Specimen: Nasal Mucosa; Nasal Swab  Result Value Ref Range Status   MRSA, PCR NEGATIVE NEGATIVE Final   Staphylococcus aureus NEGATIVE NEGATIVE Final    Comment: (NOTE) The Xpert SA Assay (FDA approved for NASAL specimens in patients 7 years of age and older), is one component of a comprehensive surveillance program. It is not intended to diagnose infection nor to guide or monitor treatment. Performed at [facility], [place] [address], [place], [place] [address]   Culture, respiratory (non-expectorated)     Status: None (Preliminary result)   Collection Time: 01/01/20  9:32 AM   Specimen: Tracheal Aspirate; Respiratory  Result Value Ref Range Status   Specimen Description TRACHEAL ASPIRATE  Final   Special Requests NONE  Final   Gram Stain   Final    MODERATE WBC PRESENT, PREDOMINANTLY PMN ABUNDANT GRAM NEGATIVE RODS ABUNDANT GRAM POSITIVE COCCI FEW YEAST Performed at [facility], [place] [address] [place], [place] [address]    Culture ABUNDANT PSEUDOMONAS AERUGINOSA  Final   Report Status PENDING  Incomplete    Coagulation Studies: No results for input(s): LABPROT, INR in the last 72 hours.  Urinalysis: No results for input(s): COLORURINE, LABSPEC, PHURINE, GLUCOSEU, HGBUR, BILIRUBINUR, KETONESUR, PROTEINUR, UROBILINOGEN, NITRITE, LEUKOCYTESUR in the last 72 hours.  Invalid  input(s): APPERANCEUR    Imaging: CT HEAD WO CONTRAST  Result Date: 01/03/2020 CLINICAL DATA:  Altered mental status EXAM: CT HEAD WITHOUT CONTRAST TECHNIQUE: Contiguous axial images were obtained from the base of the skull through the vertex without intravenous contrast. COMPARISON:  CT 12/12/2019 FINDINGS: Brain: Progressive evolution of the previously demonstrated acute on chronic right MCA distribution infarctions seen on the comparison study. This includes some gyriform hyperattenuation along the posterior right frontal lobe likely reflecting sequela of laminar necrosis. Additional cortical hypoattenuation in the anterior right frontal lobe and right parieto-occipital regions are also unchanged from comparison. Stable appearance of a remote lacunar type infarct in the left basal ganglia as well. No convincing CT evidence of acute large vascular territory or cortically based infarct nor discernible expansion of the infarct territory seen previously. No hyperdense hemorrhage, mass effect or midline shift. Asymmetric ex vacuo dilatation of the right ventricle is unchanged from prior. Finding on a background of otherwise symmetric prominence of the ventricles, cisterns and sulci suggesting diffuse parenchymal volume loss elsewhere. Patchy areas of white matter hypoattenuation are most compatible with chronic microvascular angiopathy. Vascular: Atherosclerotic calcification of the carotid siphons and intradural right vertebral artery. No worrisome hyperdense vessel. Skull: No calvarial fracture or suspicious osseous lesion. No scalp swelling or hematoma. Sinuses/Orbits: Paranasal sinuses and mastoid air cells are predominantly clear. Orbital structures are unremarkable aside from  prior lens extractions. Other: None. IMPRESSION: 1. Progressive evolution of the previously demonstrated subacute on chronic right MCA distribution infarctions seen on the comparison study. This includes some gyriform hyperattenuation  along the posterior right frontal lobe likely reflecting sequela of laminar necrosis. 2. No convincing CT evidence of acute large vascular territory or cortically based infarct. No discernible expansion of the infarct territory seen previously however subtle changes may be CT and evident. If there is persisting clinical concern, MRI should be obtained. 3. Stable remote lacunar type infarct in the left basal ganglia. 4. Stable chronic microvascular angiopathy and parenchymal volume loss. Electronically Signed   By: Lovena Le M.D.   On: 01/03/2020 22:21   DG Chest Port 1 View  Result Date: 01/03/2020 CLINICAL DATA:  Acute respiratory failure. EXAM: PORTABLE CHEST 1 VIEW COMPARISON:  01/02/2020. FINDINGS: Surgical clips noted over the right neck. Tracheostomy tube in stable position. Feeding tube noted with tip over the upper stomach. Right subclavian line stable position. Prior CABG. Heart size stable. Diffuse mild interstitial prominence again noted. No pleural effusion or pneumothorax. Stable elevation right hemidiaphragm. Right shoulder replacement. IMPRESSION: 1. Tracheostomy tube in stable position. Feeding tube noted with tip over the upper stomach. Right subclavian line stable position. 2.  Prior CABG.  Heart size normal. 3. Diffuse mild bilateral interstitial prominence again noted without interim change. Stable elevation right hemidiaphragm. Electronically Signed   By: Marcello Moores  Register   On: 01/03/2020 06:05     Medications:   . sodium chloride Stopped (01/03/20 1639)  . sodium chloride Stopped (January 21, 2020 0544)  . ceFEPime (MAXIPIME) IV    . dextrose 5 % and 0.45% NaCl    . feeding supplement (JEVITY 1.2 CAL) 1,000 mL (01/03/20 2040)  . insulin 10 mL/hr at January 21, 2020 0700  . phenylephrine (NEO-SYNEPHRINE) Adult infusion 50 mcg/min (Jan 21, 2020 0700)   . apixaban  5 mg Per Tube BID  . atorvastatin  80 mg Per Tube QHS  . budesonide (PULMICORT) nebulizer solution  0.5 mg Nebulization BID  .  chlorhexidine gluconate (MEDLINE KIT)  15 mL Mouth Rinse BID  . Chlorhexidine Gluconate Cloth  6 each Topical Q0600  . feeding supplement (PROSource TF)  45 mL Per Tube TID  . folic acid  1 mg Per Tube Daily  . insulin aspart  0-15 Units Subcutaneous Q4H  . insulin glargine  15 Units Subcutaneous Daily  . ipratropium-albuterol  3 mL Nebulization TID  . mouth rinse  15 mL Mouth Rinse 10 times per day  . melatonin  3 mg Per Tube QHS  . multivitamin with minerals  1 tablet Per Tube Daily  . pantoprazole sodium  40 mg Per Tube QHS  . QUEtiapine  50 mg Per Tube QHS  . sennosides  10 mL Per Tube QHS  . thiamine  100 mg Per Tube Daily   Or  . thiamine  100 mg Intravenous Daily   sodium chloride, acetaminophen **OR** acetaminophen, dextrose, fentaNYL (SUBLIMAZE) injection, fentaNYL (SUBLIMAZE) injection, haloperidol lactate, hydrALAZINE, ipratropium-albuterol, metoprolol tartrate, ondansetron (ZOFRAN) IV, phenol, polyethylene glycol  Assessment/ Plan:  1. Severe nonoliguric AKI on CKD 3: Likely due to cardiorenal syndrome.  Renal ultrasound without obstruction.  Complicated hospital course with fluctuation in serum creatinine level.  Started CRRT on 8/26  - 9/2  Will consider IHD in AM   2.Anticoagulation: Continues to use apixaban 5 mg twice daily  3.HTN/Volume Status: Have Discontinued CRRT 01/03/20  4.Acute hypoxic respiratory failure/ventilatory dependence: Management per primary team.  He has  tracheostomy and on vent.  5.Acute pulmonary edema/CHF: Ultrafiltration with dialysis as discussed above.[name]  6.Anemia multifactorial including kidney disease, chronic illness: Continue to monitor.Ferritin 325. Iron saturation of 12. Feraheme x2 doses.  Monitor hemoglobin. Received Aranesp on 8/27.  7. Hyponatremia hypervolemic: Managing with dialysis.  8.  Hypophosphatemia will replete 01/03/2020  9.  Palliative care meeting with patient January 20, 2020 appreciate assistance from Vinie Sill.  10.  Worsening mental status CT scan showed progressive evaluation of previously demonstrated subacute and chronic right MCA distribution infarct.  01/03/2020    LOS: [place] _0 _1 :56 AM

## 2020-02-01 NOTE — Death Summary Note (Signed)
Date of admission: November 26, 2019  Date of death: 01/26/20  Brief history: Patient was a 79 year old male who was admitted with acute stroke symptoms.  He was found on work-up to have a recurrent greater than 80% internal carotid artery stenosis on the right side.  He also had a 60 to 70% stenosis on the left side.  His previous right carotid endarterectomy had been done elsewhere.  He initially had some renal dysfunction at the time of admission but this improved with IV hydration.  His primary admitting symptom was aphasia and some left facial droop.  There was some mild discoordination of his left hand.  For a few days of recovery time he underwent a redo right carotid endarterectomy on December 04, 2019.  This was complicated by dense scar tissue.  There was some neuropraxia secondary to the procedure.  Patient initially did well in the recovery room and then had a decline in his neurologic status.  He had a stat duplex which suggested carotid occlusion.  However, when taken back to the operating room by Dr. Lou Cal he was found to have no thrombus and a patent carotid artery on reexploration.  His neurologic status improved significantly.  He self extubated on December 05, 2019.  He did have some postoperative atrial fibrillation.  However his most difficult problem was trouble with swallowing and his underlying respiratory status.  This continued to decline and he eventually required reintubation.  On postoperative day #2.  He was started on tube feeds.  He was still neurologically at baseline with dysarthria but with some cognitive defects.  He was extubated again on December 10, 2019.  He then developed again a decline in respiratory status the following day.  He was started on antibiotics for pneumonia.  48 hours later he was placed back on a ventilator.  He was still following commands and moving extremities.  At this point he had multiple areas of pneumonia and was malnourished and also deconditioned.   He then started to have a slow rise in his serum creatinine beginning around August 12.  He was transfused for anemia several times due to his likely chronic disease anemia.  There was no obvious ongoing blood loss although he did have one episode of some blood per rectum.  He underwent tracheostomy on December 22, 2019 by the ENT service.  Several attempts were made at weaning from the ventilator.  Patient's renal function continued to decline.  He was started on CRRT for renal failure on postoperative day 23.  Due to his progression of multisystem organ failure palliative care team was also consulted and multiple discussions were held with the daughter regarding the patient's overall prognosis and care.  Patient began to have further decline in his mental status and no significant improvement with renal dialysis or progression in weaning from the ventilator.  In discussion with the family he was placed on comfort care measures.  He eventually expired on 01/26/20, postoperative day 31.  Ruta Hinds, MD [facility] Office: [phone]

## 2020-02-01 NOTE — Progress Notes (Signed)
eLink Physician-Brief Progress Note Patient Name: Kelly Williams DOB: 19-Nov-1940 MRN: [id number]   Date of Service  01/20/20  HPI/Events of Note  Hypotension - BP = 74/49.  eICU Interventions  Plan: 1. Phenylephrine IV infusion via PIV. Titrate to MAP >= 65.      Intervention Category Major Interventions: Hypotension - evaluation and management  Faysal Fenoglio [place] January 20, 2020, 5:28 AM

## 2020-02-01 NOTE — Progress Notes (Signed)
CBG 438; insulin gtt initiated per ICU glycemic protocol.

## 2020-02-01 DEATH — deceased

## 2020-02-13 ENCOUNTER — Telehealth (HOSPITAL_COMMUNITY): Payer: Self-pay

## 2020-02-13 NOTE — Telephone Encounter (Signed)
°  Kelly Williams from [place] [facility] called regarding death certificate being sent to [place]. This was sent to them back in 01/2020. I called and left a message for her to call me back regarding death certificate. She did return my call, she told me that she will call the health department and let them know it was sent. I told her we could send another one if need be, but it would be next week when Dr Oneida Alar was back in the office. She will call me back if that is needed.  [facility]

## 2021-10-07 IMAGING — CT CT ANGIO NECK
2 of 12 series · 6 of 35 positions shown · IV contrast (OMNI 350)
Comparison: Brain MRI, MRA head and MRA neck 11/26/2019,
noncontrast head CT 11/26/2019

CLINICAL DATA: Stroke/TIA, assess intracranial arteries.

EXAM:
CT ANGIOGRAPHY HEAD AND NECK
TECHNIQUE: Multidetector CT imaging of the head and neck was performed using
the standard protocol during bolus administration of intravenous
contrast. Multiplanar CT image reconstructions and MIPs were
obtained to evaluate the vascular anatomy. Carotid stenosis
measurements (when applicable) are obtained utilizing NASCET
criteria, using the distal internal carotid diameter as the
denominator.
CONTRAST:  75mL OMNIPAQUE IOHEXOL 350 MG/ML SOLN

[Series 7: cta neck axial · axial · 0.39mm/px · z∈[-260,-46]mm · 5 of 345 slices shown]
[im 58/345  soft-tissue]
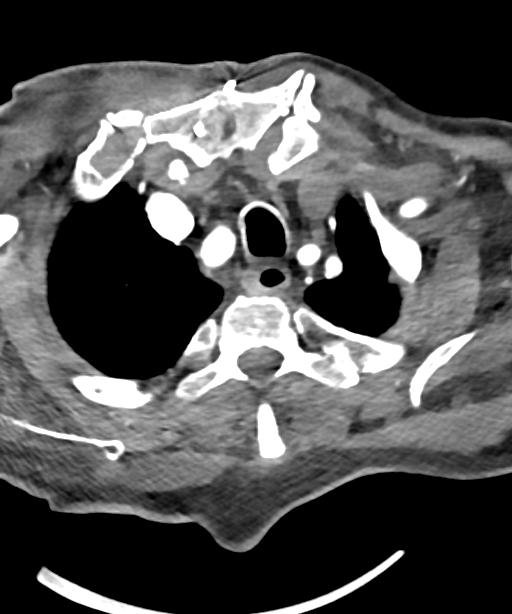
[im 115/345  bone]
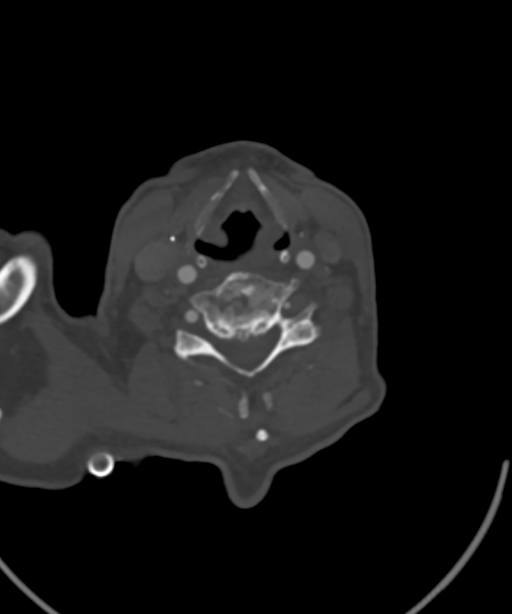
[im 173/345  soft-tissue]
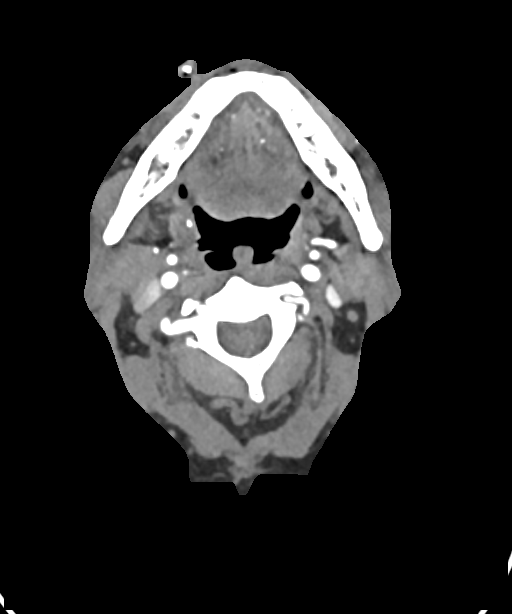
[im 230/345  bone]
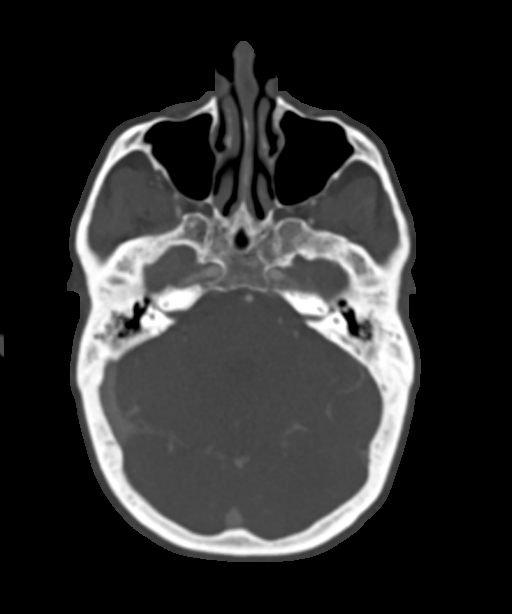
[im 287/345  soft-tissue]
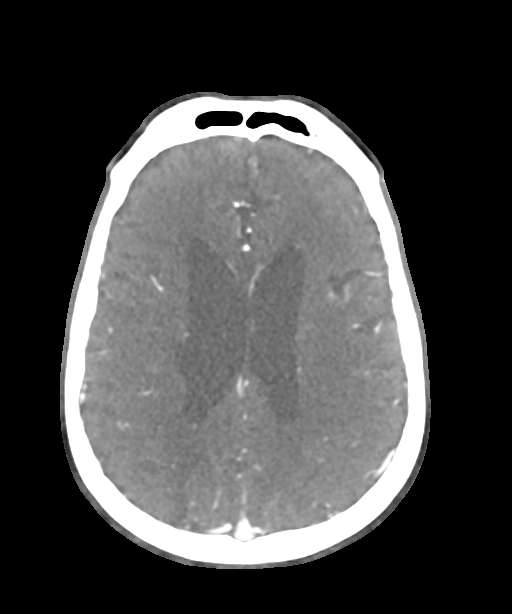

[Series 9: cta neck sagittal · sagittal · 0.45mm/px · 1 of 201 slices shown]
[im 125/201  soft-tissue]
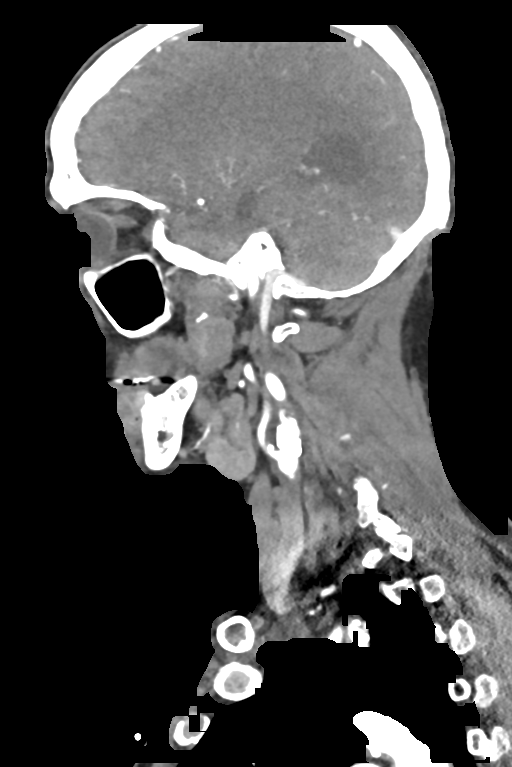

[6 of 35 positions shown; findings below may reference images not displayed]

FINDINGS: CT HEAD FINDINGS

Brain:

Stable, moderate generalized parenchymal atrophy.

Evolving acute/early subacute ischemic infarct superimposed upon a
chronic infarct within the right frontal operculum, not simply
changed in extent as compared to the brain MRI of 11/26/2019. No
evidence of hemorrhagic conversion. No significant mass effect.

An additional small acute/early subacute infarct within the right
parietal lobe was better appreciated on the prior MRI of 11/26/2019.

Redemonstrated background chronic cortically based infarcts within
the right frontal, right parietal, bilateral occipital and right
temporal lobes.

Redemonstrated chronic lacunar infarcts in the basal ganglia and
right thalamus. A tiny chronic lacunar infarct within the inferior
left cerebellum was better appreciated on the prior MRI.

Stable background advanced chronic small vessel ischemic disease
within the cerebral white matter.

No extra-axial fluid collection.

No evidence of intracranial mass.

No midline shift.

Vascular: Reported below.

Skull: Normal. Negative for fracture or focal lesion.

Sinuses: No significant paranasal sinus disease or mastoid effusion
at the imaged levels.

Orbits: No acute abnormality.

Review of the MIP images confirms the above findings

CTA NECK FINDINGS

Aortic arch: Standard aortic branching. Atherosclerotic plaque
within the visualized aortic arch and proximal major branch vessels
of the neck. No hemodynamically significant innominate or proximal
subclavian artery stenosis.

Right carotid system: CCA and ICA patent within the neck. Prior
right carotid endarterectomy. Redemonstrated, approximately 3 cm
distal to the bifurcation, there is irregularity of the proximal ICA
with luminal narrowing of 70-80% as compared to the vessel more
distally within the neck (series 7, image 185). Also arising from
this region is an 11 mm pseudoaneurysm. Redemonstrated mild/moderate
narrowing of the distal cervical ICA.

Left carotid system: CCA and ICA patent within the neck. Mild
calcified plaque at the CCA origin. There is more prominent mixed
plaque within the carotid bifurcation and proximal ICA. Stenosis of
the proximal ICA of 60-70% (series 7, image 193).

Vertebral arteries: The vertebral arteries are patent within the
neck. The right vertebral artery is dominant. Sites of moderate
stenosis within the V1 right vertebral artery. Nonstenotic plaque at
the origin of the left vertebral artery.

Skeleton: No acute bony abnormality or aggressive osseous lesion.
Cervical spondylosis with multilevel disc space narrowing, posterior
disc osteophytes, uncovertebral and facet hypertrophy. Fusion across
the C4-C5, C5-C6 and C6-C7 disc spaces.

Other neck: Nonspecific cystic lesion within the subcutaneous left
upper neck soft tissues measuring 11 mm (series 7, image 25). No
cervical lymphadenopathy.

Upper chest: No consolidation within the imaged lung apices. Prior
median sternotomy.

Review of the MIP images confirms the above findings

CTA HEAD FINDINGS

Anterior circulation:

The intracranial internal carotid arteries are patent. Mixed plaque
within both vessels. On the right, there are sites of moderate
stenosis within the cavernous segment and severe stenosis of the
paraclinoid segment. On the left, there is moderate stenosis of the
cavernous segment.

The M1 middle cerebral arteries are patent without significant
stenosis. Mild stenosis within the M1 right MCA. No M2 proximal
branch occlusion or high-grade proximal stenosis is identified.

The anterior cerebral arteries are patent. Severe focal stenosis at
the origin of the A1 right ACA (series 10, image 17).

No intracranial aneurysm is identified.

Posterior circulation:

The non dominant intracranial left vertebral artery is
developmentally diminutive, but patent and terminates as the left
PICA. Moderate atherosclerotic narrowing of the V4 right vertebral
artery proximal to the right PICA origin. The basilar artery is
patent. The posterior cerebral arteries are patent bilaterally.
Redemonstrated multifocal atherosclerotic irregularity of the right
posterior cerebral artery. Most notably, there are sites of severe
stenosis within the P2 right PCA (series 10, image 21). Posterior
communicating arteries are hypoplastic or absent bilaterally.

Venous sinuses: Within limitations of contrast timing, no convincing
thrombus.

Anatomic variants: As described

Review of the MIP images confirms the above findings
IMPRESSION: CT head:

1. Evolving acute/early subacute ischemic infarct within the right
frontal operculum superimposed upon a chronic right frontal lobe
infarct. This infarct has not significantly changed in extent as
compared to the prior brain MRI of 11/26/2019. No evidence of
hemorrhagic conversion. No significant mass effect.
2. An additional small acute/early subacute infarct within the right
parietal lobe was better appreciated on the prior MRI.
3. Stable background generalized parenchymal atrophy and chronic
ischemic changes as outlined.

CTA neck:

1. Prior right carotid endarterectomy. Approximately 3 cm distal to
the carotid bifurcation, there is irregularity of the proximal right
ICA with focal stenosis of 70-80% and an adjacent 11 mm
pseudoaneurysm. Additionally, there is mild/moderate stenosis of the
distal right cervical ICA.
2. 60-70% stenosis of the proximal left ICA.
3. Sites of moderate atherosclerotic narrowing within the dominant
V1 right vertebral artery.

CTA head:

1. No intracranial large vessel occlusion.
2. Intracranial atherosclerotic disease with multifocal stenoses,
most notably as follows.
3. Severe stenosis of the paraclinoid right ICA.
4. Moderate stenosis of the cavernous internal carotid arteries
bilaterally.
5. Severe focal stenosis at the origin of the A1 right anterior
cerebral artery.
6. Moderate atherosclerotic narrowing of the V4 right vertebral
artery. Of note, the non dominant left vertebral artery terminates
as the left PICA.
7. Sites of severe stenosis within the P2 right PCA.
